# Patient Record
Sex: Female | Born: 1995 | Race: White | Hispanic: No | Marital: Single | Smoking: Current every day smoker
Health system: Southern US, Community
[De-identification: ages and names within clinical notes are randomized; demographics above are authoritative.]

## PROBLEM LIST (undated history)

## (undated) DIAGNOSIS — F419 Anxiety disorder, unspecified: Secondary | ICD-10-CM

## (undated) DIAGNOSIS — F32A Depression, unspecified: Secondary | ICD-10-CM

## (undated) DIAGNOSIS — I639 Cerebral infarction, unspecified: Secondary | ICD-10-CM

## (undated) DIAGNOSIS — G43909 Migraine, unspecified, not intractable, without status migrainosus: Secondary | ICD-10-CM

## (undated) DIAGNOSIS — J45909 Unspecified asthma, uncomplicated: Secondary | ICD-10-CM

## (undated) HISTORY — DX: Migraine, unspecified, not intractable, without status migrainosus: G43.909

## (undated) HISTORY — DX: Cerebral infarction, unspecified: I63.9

## (undated) HISTORY — PX: NO PAST SURGERIES: SHX2092

## (undated) HISTORY — DX: Depression, unspecified: F32.A

## (undated) HISTORY — DX: Anxiety disorder, unspecified: F41.9

---
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
## 2015-01-30 ENCOUNTER — Emergency Department
Admission: EM | Admit: 2015-01-30 | Discharge: 2015-01-30 | Disposition: A | Discharge status: Home / Self Care | Payer: Medicaid Other | Attending: Emergency Medicine | Admitting: Emergency Medicine

## 2015-01-30 ENCOUNTER — Encounter: Payer: Self-pay | Admitting: *Deleted

## 2015-01-30 DIAGNOSIS — J069 Acute upper respiratory infection, unspecified: Secondary | ICD-10-CM

## 2015-01-30 MED ORDER — BENZONATATE 100 MG PO CAPS: 100.0000 mg | ORAL_CAPSULE | Freq: Three times a day (TID) | ORAL | Status: DC | PRN

## 2015-01-30 MED ORDER — IBUPROFEN 800 MG PO TABS
800.0000 mg | ORAL_TABLET | Freq: Three times a day (TID) | ORAL | Status: DC | PRN
Start: 2015-01-30 — End: 2015-08-23

## 2015-01-30 MED ORDER — FEXOFENADINE-PSEUDOEPHED ER 60-120 MG PO TB12: 1.0000 | ORAL_TABLET | Freq: Two times a day (BID) | ORAL | Status: DC

## 2015-01-30 NOTE — ED Provider Notes (Signed)
[facility] Emergency Department Provider Note  ____________________________________________  Time seen: Approximately 12:58 PM  I have reviewed the triage vital signs and the nursing notes.   HISTORY  Chief Complaint URI    HPI Kristen Grant is a 19 y.o. female patient complain of cold symptoms for several days. Patient stated there is occasional cough with nasal congestion, frontal headache, ear pressure, and sore throat.Patient also complaining of intermittent fever and body aches. She states she had the same complaints 2 weeks ago and resolved until 5 days ago when she was exposed to a coworker was diagnosed with the flu. Patient denies any nausea vomiting diarrhea with this complaint. Patient has been using over-the-counter cold preparations for a transient relief. Patient denies any pain at this time.   History reviewed. No pertinent past medical history.  There are no active problems to display for this patient.   History reviewed. No pertinent past surgical history.  Current Outpatient Rx  Name  Route  Sig  Dispense  Refill  . benzonatate (TESSALON PERLES) 100 MG capsule   Oral   Take 1 capsule (100 mg total) by mouth 3 (three) times daily as needed for cough.   15 capsule   0   . fexofenadine-pseudoephedrine (ALLEGRA-D) 60-120 MG 12 hr tablet   Oral   Take 1 tablet by mouth 2 (two) times daily.   30 tablet   0   . ibuprofen (ADVIL,MOTRIN) 800 MG tablet   Oral   Take 1 tablet (800 mg total) by mouth every 8 (eight) hours as needed.   30 tablet   0     Allergies Review of patient's allergies indicates no known allergies.  No family history on file.  Social History Social History  Substance Use Topics  . Smoking status: Never Smoker   . Smokeless tobacco: None  . Alcohol Use: No    Review of Systems Constitutional: Fever and chills/body aches Eyes: No visual changes. ENT: Sore throat Cardiovascular: Denies chest  pain. Respiratory: Denies shortness of breath. Cough Gastrointestinal: No abdominal pain.  No nausea, no vomiting.  No diarrhea.  No constipation. Genitourinary: Negative for dysuria. Musculoskeletal: Negative for back pain. Skin: Negative for rash. Neurological: Positive for frontal headaches, denies focal weakness or numbness. 10-point ROS otherwise negative.  ____________________________________________   PHYSICAL EXAM:  VITAL SIGNS: ED Triage Vitals  Enc Vitals Group     BP 01/30/15 1142 144/90 mmHg     Pulse Rate 01/30/15 1142 89     Resp 01/30/15 1142 20     Temp 01/30/15 1142 97.9 F (36.6 C)     Temp Source 01/30/15 1142 Oral     SpO2 01/30/15 1142 100 %     Weight 01/30/15 1142 200 lb (90.719 kg)     Height 01/30/15 1142 5\' 7"  (1.702 m)     Head Cir --      Peak Flow --      Pain Score --      Pain Loc --      Pain Edu? --      Excl. in GC? --     Constitutional: Alert and oriented. Well appearing and in no acute distress. Eyes: Conjunctivae are normal. PERRL. EOMI. Head: Atraumatic. Nose: Bilateral edematous nasal turbinates. Bilateral maxillary guarding. Mouth/Throat: Mucous membranes are moist.  Oropharynx erythematous exudate. Postnasal drainage. Neck: No stridor. No cervical spine tenderness to palpation. Hematological/Lymphatic/Immunilogical: No cervical lymphadenopathy. Cardiovascular: Normal rate, regular rhythm. Grossly normal heart sounds.  Good peripheral circulation. Respiratory: Normal respiratory effort.  No retractions. Lungs CTAB. Gastrointestinal: Soft and nontender. No distention. No abdominal bruits. No CVA tenderness. Musculoskeletal: No lower extremity tenderness nor edema.  No joint effusions. Neurologic:  Normal speech and language. No gross focal neurologic deficits are appreciated. No gait instability. Skin:  Skin is warm, dry and intact. No rash noted. Psychiatric: Mood and affect are normal. Speech and behavior are  normal.  ____________________________________________   LABS (all labs ordered are listed, but only abnormal results are displayed)  Labs Reviewed - No data to display ____________________________________________  EKG   ____________________________________________  RADIOLOGY   ____________________________________________   PROCEDURES  Procedure(s) performed: None  Critical Care performed: No  ____________________________________________   INITIAL IMPRESSION / ASSESSMENT AND PLAN / ED COURSE  Pertinent labs & imaging results that were available during my care of the patient were reviewed by me and considered in my medical decision making (see chart for details).  Upper rest or infection. Patient given discharge Instructions. Patient given a prescription for Allegra-D, Tessalon pearls, and ibuprofen. Patient given a work note for 2 days. Last the follow-up with the [facility] condition persists. ____________________________________________   FINAL CLINICAL IMPRESSION(S) / ED DIAGNOSES  Final diagnoses:  URI (upper respiratory infection)      Joni Reining, PA-C 01/30/15 1306  Minna Antis, MD 01/30/15 [phone]

## 2015-01-30 NOTE — ED Notes (Signed)
Pt reports cough, congestion and body aches for 5 days

## 2015-01-30 NOTE — ED Notes (Signed)
Pt discharged home after verbalizing understanding of discharge instructions; nad noted. 

## 2015-01-30 NOTE — ED Notes (Signed)
States she developed cold sx's several days ago  Some fever and body aches   With occasional cough

## 2015-01-30 NOTE — Discharge Instructions (Signed)
Upper Respiratory Infection, Adult Most upper respiratory infections (URIs) are a viral infection of the air passages leading to the lungs. A URI affects the nose, throat, and upper air passages. The most common type of URI is nasopharyngitis and is typically referred to as "the common cold." URIs run their course and usually go away on their own. Most of the time, a URI does not require medical attention, but sometimes a bacterial infection in the upper airways can follow a viral infection. This is called a secondary infection. Sinus and middle ear infections are common types of secondary upper respiratory infections. Bacterial pneumonia can also complicate a URI. A URI can worsen asthma and chronic obstructive pulmonary disease (COPD). Sometimes, these complications can require emergency medical care and may be life threatening.  CAUSES Almost all URIs are caused by viruses. A virus is a type of germ and can spread from one person to another.  RISKS FACTORS You may be at risk for a URI if:   You smoke.   You have chronic heart or lung disease.  You have a weakened defense (immune) system.   You are very young or very old.   You have nasal allergies or asthma.  You work in crowded or poorly ventilated areas.  You work in health care facilities or schools. SIGNS AND SYMPTOMS  Symptoms typically develop 2-3 days after you come in contact with a cold virus. Most viral URIs last 7-10 days. However, viral URIs from the influenza virus (flu virus) can last 14-18 days and are typically more severe. Symptoms may include:   Runny or stuffy (congested) nose.   Sneezing.   Cough.   Sore throat.   Headache.   Fatigue.   Fever.   Loss of appetite.   Pain in your forehead, behind your eyes, and over your cheekbones (sinus pain).  Muscle aches.  DIAGNOSIS  Your health care provider may diagnose a URI by:  Physical exam.  Tests to check that your symptoms are not due to  another condition such as:  Strep throat.  Sinusitis.  Pneumonia.  Asthma. TREATMENT  A URI goes away on its own with time. It cannot be cured with medicines, but medicines may be prescribed or recommended to relieve symptoms. Medicines may help:  Reduce your fever.  Reduce your cough.  Relieve nasal congestion. HOME CARE INSTRUCTIONS   Take medicines only as directed by your health care provider.   Gargle warm saltwater or take cough drops to comfort your throat as directed by your health care provider.  Use a warm mist humidifier or inhale steam from a shower to increase air moisture. This may make it easier to breathe.  Drink enough fluid to keep your urine clear or pale yellow.   Eat soups and other clear broths and maintain good nutrition.   Rest as needed.   Return to work when your temperature has returned to normal or as your health care provider advises. You may need to stay home longer to avoid infecting others. You can also use a face mask and careful hand washing to prevent spread of the virus.  Increase the usage of your inhaler if you have asthma.   Do not use any tobacco products, including cigarettes, chewing tobacco, or electronic cigarettes. If you need help quitting, ask your health care provider. PREVENTION  The best way to protect yourself from getting a cold is to practice good hygiene.   Avoid oral or hand contact with people with cold   symptoms.   Wash your hands often if contact occurs.  There is no clear evidence that vitamin C, vitamin E, echinacea, or exercise reduces the chance of developing a cold. However, it is always recommended to get plenty of rest, exercise, and practice good nutrition.  SEEK MEDICAL CARE IF:   You are getting worse rather than better.   Your symptoms are not controlled by medicine.   You have chills.  You have worsening shortness of breath.  You have brown or red mucus.  You have yellow or brown nasal  discharge.  You have pain in your face, especially when you bend forward.  You have a fever.  You have swollen neck glands.  You have pain while swallowing.  You have white areas in the back of your throat. SEEK IMMEDIATE MEDICAL CARE IF:   You have severe or persistent:  Headache.  Ear pain.  Sinus pain.  Chest pain.  You have chronic lung disease and any of the following:  Wheezing.  Prolonged cough.  Coughing up blood.  A change in your usual mucus.  You have a stiff neck.  You have changes in your:  Vision.  Hearing.  Thinking.  Mood. MAKE SURE YOU:   Understand these instructions.  Will watch your condition.  Will get help right away if you are not doing well or get worse.   This information is not intended to replace advice given to you by your health care provider. Make sure you discuss any questions you have with your health care provider.   Document Released: 07/15/2000 Document Revised: 06/05/2014 Document Reviewed: 04/26/2013 Elsevier Interactive Patient Education 2016 [employer]  

## 2015-02-03 NOTE — L&D Delivery Note (Signed)
Delivery Note At 11:25 AM a viable female was delivered via Vaginal, Spontaneous Delivery (Presentation: ; Occiput Anterior).  APGAR: 7, 9; weight 8 lb 3 oz (3714 g).   Placenta status: Intact, Spontaneous.  Cord: 3 vessels, no cord blood needed. 2nd degree lac repaired with local 1% xylocaine with 3-0 CH on CT. Mod bleeding noted after placenta and Pitocin infusing wide open. Fundus massaged till firm. EBL 400 ml's. VSS, Hemostasis achieved. Bonding skin to skin. Pt did an amazing job pushing in a natural way to delivery. Fetus had a few variables and O2 was used and then head compression prior to delivery.   Anesthesia: 1% xylocaine Episiotomy: None Lacerations: 2nd degree;Periurethral Suture Repair: 3.0 chromic Est. Blood Loss (mL):  400  Mom to postpartum.  Baby to Couplet care / Skin to Skin.  Sharee Pimple 08/24/2015, 12:09 PM

## 2015-07-22 DIAGNOSIS — E669 Obesity, unspecified: Secondary | ICD-10-CM | POA: Insufficient documentation

## 2015-07-23 ENCOUNTER — Other Ambulatory Visit: Payer: Self-pay | Admitting: Obstetrics and Gynecology

## 2015-07-23 DIAGNOSIS — Z3689 Encounter for other specified antenatal screening: Secondary | ICD-10-CM

## 2015-07-25 ENCOUNTER — Ambulatory Visit
Admission: RE | Admit: 2015-07-25 | Discharge: 2015-07-25 | Disposition: A | Discharge status: Home / Self Care | Payer: Medicaid Other | Source: Ambulatory Visit | Attending: Obstetrics and Gynecology | Admitting: Obstetrics and Gynecology

## 2015-07-25 DIAGNOSIS — Z3689 Encounter for other specified antenatal screening: Secondary | ICD-10-CM

## 2015-07-25 DIAGNOSIS — Z3201 Encounter for pregnancy test, result positive: Secondary | ICD-10-CM | POA: Diagnosis not present

## 2015-07-25 DIAGNOSIS — O0933 Supervision of pregnancy with insufficient antenatal care, third trimester: Secondary | ICD-10-CM | POA: Diagnosis present

## 2015-07-25 DIAGNOSIS — Z3A37 37 weeks gestation of pregnancy: Secondary | ICD-10-CM | POA: Insufficient documentation

## 2015-07-29 ENCOUNTER — Other Ambulatory Visit: Payer: Self-pay | Admitting: Primary Care

## 2015-07-29 DIAGNOSIS — Z3201 Encounter for pregnancy test, result positive: Secondary | ICD-10-CM

## 2015-07-30 LAB — OB RESULTS CONSOLE RPR: RPR: NONREACTIVE

## 2015-07-30 LAB — OB RESULTS CONSOLE GBS: STREP GROUP B AG: POSITIVE

## 2015-07-30 LAB — OB RESULTS CONSOLE VARICELLA ZOSTER ANTIBODY, IGG: Varicella: NON-IMMUNE/NOT IMMUNE

## 2015-07-30 LAB — OB RESULTS CONSOLE RUBELLA ANTIBODY, IGM: RUBELLA: IMMUNE

## 2015-07-30 LAB — OB RESULTS CONSOLE HIV ANTIBODY (ROUTINE TESTING): HIV: NONREACTIVE

## 2015-07-30 LAB — OB RESULTS CONSOLE GC/CHLAMYDIA: GC PROBE AMP, GENITAL: NEGATIVE

## 2015-08-01 DIAGNOSIS — O09899 Supervision of other high risk pregnancies, unspecified trimester: Secondary | ICD-10-CM | POA: Insufficient documentation

## 2015-08-01 DIAGNOSIS — Z2839 Other underimmunization status: Secondary | ICD-10-CM | POA: Insufficient documentation

## 2015-08-07 ENCOUNTER — Ambulatory Visit: Payer: Medicaid Other

## 2015-08-23 ENCOUNTER — Inpatient Hospital Stay
Admission: EM | Admit: 2015-08-23 | Discharge: 2015-08-26 | DRG: 774 | Disposition: A | Discharge status: Home / Self Care | Payer: Medicaid Other | Attending: Obstetrics and Gynecology | Admitting: Obstetrics and Gynecology

## 2015-08-23 DIAGNOSIS — O99214 Obesity complicating childbirth: Secondary | ICD-10-CM | POA: Diagnosis present

## 2015-08-23 DIAGNOSIS — B589 Toxoplasmosis, unspecified: Secondary | ICD-10-CM | POA: Diagnosis present

## 2015-08-23 DIAGNOSIS — Z79899 Other long term (current) drug therapy: Secondary | ICD-10-CM | POA: Diagnosis not present

## 2015-08-23 DIAGNOSIS — O99824 Streptococcus B carrier state complicating childbirth: Secondary | ICD-10-CM | POA: Diagnosis present

## 2015-08-23 DIAGNOSIS — Z3A41 41 weeks gestation of pregnancy: Secondary | ICD-10-CM

## 2015-08-23 DIAGNOSIS — O480 Post-term pregnancy: Secondary | ICD-10-CM | POA: Diagnosis present

## 2015-08-23 DIAGNOSIS — O0930 Supervision of pregnancy with insufficient antenatal care, unspecified trimester: Secondary | ICD-10-CM | POA: Diagnosis not present

## 2015-08-23 DIAGNOSIS — O9862 Protozoal diseases complicating childbirth: Secondary | ICD-10-CM | POA: Diagnosis present

## 2015-08-23 HISTORY — DX: Unspecified asthma, uncomplicated: J45.909

## 2015-08-23 LAB — CBC
HCT: 34.2 % — ABNORMAL LOW (ref 35.0–47.0)
Hemoglobin: 11.7 g/dL — ABNORMAL LOW (ref 12.0–16.0)
MCH: 27.3 pg (ref 26.0–34.0)
MCHC: 34.2 g/dL (ref 32.0–36.0)
MCV: 79.8 fL — AB (ref 80.0–100.0)
PLATELETS: 255 10*3/uL (ref 150–440)
RBC: 4.29 MIL/uL (ref 3.80–5.20)
RDW: 24.2 % — AB (ref 11.5–14.5)
WBC: 9 10*3/uL (ref 3.6–11.0)

## 2015-08-23 LAB — TYPE AND SCREEN
ABO/RH(D): A POS
Antibody Screen: NEGATIVE

## 2015-08-23 MED ORDER — LIDOCAINE HCL (PF) 1 % IJ SOLN: 30.0000 mL | INTRAMUSCULAR | Status: DC | PRN

## 2015-08-23 MED ORDER — SIMETHICONE 80 MG PO CHEW: 80.0000 mg | CHEWABLE_TABLET | Freq: Four times a day (QID) | ORAL | Status: DC | PRN

## 2015-08-23 MED ORDER — SOD CITRATE-CITRIC ACID 500-334 MG/5ML PO SOLN: 30.0000 mL | ORAL | Status: DC | PRN

## 2015-08-23 MED ORDER — CALCIUM CARBONATE ANTACID 500 MG PO CHEW
1.0000 | CHEWABLE_TABLET | ORAL | Status: DC | PRN
  Administered 2015-08-23 – 2015-08-24 (×3): 200 mg via ORAL
  Filled 2015-08-23 (×4): qty 1

## 2015-08-23 MED ORDER — LIDOCAINE HCL (PF) 1 % IJ SOLN
INTRAMUSCULAR | Status: AC
  Filled 2015-08-23: qty 30

## 2015-08-23 MED ORDER — ACETAMINOPHEN 325 MG PO TABS: 650.0000 mg | ORAL_TABLET | ORAL | Status: DC | PRN

## 2015-08-23 MED ORDER — BUTORPHANOL TARTRATE 1 MG/ML IJ SOLN
1.0000 mg | INTRAMUSCULAR | Status: DC | PRN
  Administered 2015-08-24 (×2): 1 mg via INTRAVENOUS
  Filled 2015-08-23 (×2): qty 1

## 2015-08-23 MED ORDER — SODIUM CHLORIDE 0.9 % IV SOLN
2.0000 g | Freq: Once | INTRAVENOUS | Status: AC
  Administered 2015-08-23: 2 g via INTRAVENOUS
  Filled 2015-08-23: qty 2000

## 2015-08-23 MED ORDER — OXYTOCIN 40 UNITS IN LACTATED RINGERS INFUSION - SIMPLE MED
2.5000 [IU]/h | INTRAVENOUS | Status: DC
  Administered 2015-08-24: 2.5 [IU]/h via INTRAVENOUS
  Filled 2015-08-23: qty 1000

## 2015-08-23 MED ORDER — OXYTOCIN 10 UNIT/ML IJ SOLN
INTRAMUSCULAR | Status: AC
  Filled 2015-08-23: qty 2

## 2015-08-23 MED ORDER — MISOPROSTOL 200 MCG PO TABS
ORAL_TABLET | ORAL | Status: AC
  Filled 2015-08-23: qty 4

## 2015-08-23 MED ORDER — ONDANSETRON HCL 4 MG/2ML IJ SOLN
4.0000 mg | Freq: Four times a day (QID) | INTRAMUSCULAR | Status: DC | PRN
  Administered 2015-08-24: 4 mg via INTRAVENOUS
  Filled 2015-08-23: qty 2

## 2015-08-23 MED ORDER — LACTATED RINGERS IV SOLN
INTRAVENOUS | Status: DC
  Administered 2015-08-23: 125 mL/h via INTRAVENOUS
  Administered 2015-08-24: 05:00:00 via INTRAVENOUS

## 2015-08-23 MED ORDER — DINOPROSTONE 10 MG VA INST
10.0000 mg | VAGINAL_INSERT | Freq: Once | VAGINAL | Status: AC
  Administered 2015-08-23: 10 mg via VAGINAL
  Filled 2015-08-23 (×2): qty 1

## 2015-08-23 MED ORDER — OXYTOCIN 40 UNITS IN LACTATED RINGERS INFUSION - SIMPLE MED: 1.0000 m[IU]/min | INTRAVENOUS | Status: DC

## 2015-08-23 MED ORDER — LACTATED RINGERS IV SOLN: 500.0000 mL | INTRAVENOUS | Status: DC | PRN

## 2015-08-23 MED ORDER — OXYTOCIN BOLUS FROM INFUSION
500.0000 mL | INTRAVENOUS | Status: DC
  Administered 2015-08-24: 500 mL via INTRAVENOUS

## 2015-08-23 MED ORDER — AMMONIA AROMATIC IN INHA
RESPIRATORY_TRACT | Status: AC
  Filled 2015-08-23: qty 10

## 2015-08-23 MED ORDER — AMPICILLIN SODIUM 1 G IJ SOLR
1.0000 g | Freq: Four times a day (QID) | INTRAMUSCULAR | Status: DC
  Administered 2015-08-23 – 2015-08-24 (×5): 1 g via INTRAVENOUS
  Filled 2015-08-23 (×5): qty 1000

## 2015-08-23 MED ORDER — TERBUTALINE SULFATE 1 MG/ML IJ SOLN: 0.2500 mg | Freq: Once | INTRAMUSCULAR | Status: DC | PRN

## 2015-08-23 NOTE — Progress Notes (Signed)
S: Resting comfortably  no CTX, no LOF, VB.O: Filed Vitals:   08/23/15 1235 08/23/15 1305 08/23/15 1325 08/23/15 1613  BP: 125/73  113/63 133/74  Pulse: 73  68 74  Temp: 98.4 F (36.9 C)  98.4 F (36.9 C) 98.4 F (36.9 C)  TempSrc: Oral  Axillary Oral  Resp: 18  17 18   Height:  5\' 9"  (1.753 m)    Weight:  218 lb (98.884 kg)       Gen: NAD, AAOx3      Abd: FNTTP      Ext: Non-tender, Nonedmeatous    FHT: +mod var + accelerations no decelerations TOCO: none   A/P:  20 y.o. yo G1P0 at 7062w2d for Post-dates IOL.   Labor: Induction with Cervidil repeat tonight after shower  FWB: Reassuring Cat 1 tracing. EFW 8#9  Cervidil fell out at 2300. Will replace after pt has shower and dinner.   Sharee Pimplearon W Jones 11:25 PM

## 2015-08-23 NOTE — H&P (Signed)
  OB ADMISSION/ HISTORY & PHYSICAL:  Admission Date: 08/23/2015  9:07 AM  Admit Diagnosis: Induction of labor at 41+2 weeks for postdates   Kristen Grant is a 20 y.o. female presenting for induction of labr at 41+2 weeks for postdates.   Prenatal History: G1P0   EDC : 08/14/2015, by unknown LMP, confirmed by 36 week US  [facility] Prenatal course complicated by late entry to prenatal care at 36 weeks, GBS positive, varicella non-immune, acquired toxoplasmosis, unknown paternity (3 potential fathers), obesity   Prenatal Labs: ABO, Rh: --/--/A POS (07/21 1222) Antibody: NEG (07/21 1222) Rubella:   Immune Varicella: Non-Immune  RPR:   NR HBsAg:   Negative HIV:   Negative Hgb A1C: 5.4 GBS:   Positive   Medical / Surgical History :  Past medical history:  Past Medical History  Diagnosis Date  . Asthma     no treatments required at this time     Past surgical history:  Past Surgical History  Procedure Laterality Date  . No past surgeries      Family History: History reviewed. No pertinent family history.   Social History:  reports that she has never smoked. She has never used smokeless tobacco. She reports that she does not drink alcohol or use illicit drugs.   Allergies: Review of patient's allergies indicates no known allergies.    Current Medications at time of admission:  Prior to Admission medications   Medication Sig Start Date End Date Taking? Authorizing Provider  ferrous sulfate 325 (65 FE) MG EC tablet Take 325 mg by mouth 3 (three) times daily with meals.   Yes Historical Provider, MD  ranitidine (ZANTAC) 75 MG tablet Take 150 mg by mouth 2 (two) times daily.   Yes Historical Provider, MD     Review of Systems: Active FM Irregular No LOF  / SROM  No bloody show   Physical Exam:  VS: Blood pressure 125/73, pulse 73, temperature 98.4 F (36.9 C), temperature source Oral, resp. rate 18.  General: alert and oriented, appears  calm Heart: RRR Lungs: Clear lung fields Abdomen: Gravid, soft and non-tender, non-distended / uterus: gravid, non-tender Extremities: no edema  Genitalia / VE: Dilation: 2.5 Effacement (%): 50, 60 Station: -3 Exam by:: Millner, RN   FHR: baseline rate 125 bpm / variability Moderate / accelerations + / no decelerations TOCO: irregular   Assessment: 41+[redacted] weeks gestation Induction stage of labor FHR category 1   Plan:  [facility]. [facility] for Postdates    - Routine Labor and Delivery orders    - May have Stadol 1mg  IVP every 1 hour PRN    - May have epidural upon request     - Was planning for a Pitocin induction based on cervical exam, but due to staffing and high patient acuity, we had to proceed with cervical ripening first, will plan for Pitocin after  2. GBS Positive     - Ampicillin 2 grams x 1 dose, then 1 gram every 6 hours 3. Varicella Non-immune     - Varivax vaccine PP 4. Anticipate NSVD  Dr. Dalbert GarnetBeasley  notified of admission / plan of care  Carlean JewsMeredith Sigmon, CNM

## 2015-08-23 NOTE — Progress Notes (Signed)
S: Resting comfortably with Cervidil in place.  + CTX, no LOF, VB O: Filed Vitals:   08/23/15 1235 08/23/15 1305 08/23/15 1325 08/23/15 1613  BP: 125/73  113/63 133/74  Pulse: 73  68 74  Temp: 98.4 F (36.9 C)  98.4 F (36.9 C) 98.4 F (36.9 C)  TempSrc: Oral  Axillary Oral  Resp: 18  17 18   Height:  5\' 9"  (1.753 m)    Weight:  218 lb (98.884 kg)       Gen: NAD, AAOx3, Smiling and happy with plan of care      Abd: FNTTP      Ext: Non-tender, Nonedmeatous    FHT:  mod var + accelerations no decelerations TOCO: Q 3 min SVE: not rechecked as Cervidil in x 6 hrs.    A/P:  20 y.o. yo G1P0 at 339w2d for post-dates IOL.   Labor:early  FWB: Reassuring Cat 1 tracing. EFW 8#12  GBS:neg  Contraception: at 6 weeks    Kristen Grant 6:55 PM

## 2015-08-24 ENCOUNTER — Encounter: Payer: Self-pay | Admitting: *Deleted

## 2015-08-24 LAB — RPR: RPR: NONREACTIVE

## 2015-08-24 LAB — CHLAMYDIA/NGC RT PCR (ARMC ONLY)
Chlamydia Tr: NOT DETECTED
N gonorrhoeae: NOT DETECTED

## 2015-08-24 MED ORDER — BISACODYL 10 MG RE SUPP
10.0000 mg | Freq: Every day | RECTAL | Status: DC | PRN
  Filled 2015-08-24: qty 1

## 2015-08-24 MED ORDER — COCONUT OIL OIL
1.0000 "application " | TOPICAL_OIL | Status: DC | PRN
  Filled 2015-08-24 (×2): qty 120

## 2015-08-24 MED ORDER — DIPHENHYDRAMINE HCL 25 MG PO CAPS: 25.0000 mg | ORAL_CAPSULE | Freq: Four times a day (QID) | ORAL | Status: DC | PRN

## 2015-08-24 MED ORDER — BENZOCAINE-MENTHOL 20-0.5 % EX AERO: 1.0000 "application " | INHALATION_SPRAY | CUTANEOUS | Status: DC | PRN

## 2015-08-24 MED ORDER — WITCH HAZEL-GLYCERIN EX PADS: 1.0000 "application " | MEDICATED_PAD | CUTANEOUS | Status: DC | PRN

## 2015-08-24 MED ORDER — SODIUM CHLORIDE 0.9% FLUSH: 3.0000 mL | INTRAVENOUS | Status: DC | PRN

## 2015-08-24 MED ORDER — SODIUM CHLORIDE 0.9% FLUSH: 3.0000 mL | Freq: Two times a day (BID) | INTRAVENOUS | Status: DC

## 2015-08-24 MED ORDER — SENNOSIDES-DOCUSATE SODIUM 8.6-50 MG PO TABS
2.0000 | ORAL_TABLET | ORAL | Status: DC
  Administered 2015-08-25 – 2015-08-26 (×2): 2 via ORAL
  Filled 2015-08-24 (×2): qty 2

## 2015-08-24 MED ORDER — OXYCODONE HCL 5 MG PO TABS: 10.0000 mg | ORAL_TABLET | ORAL | Status: DC | PRN

## 2015-08-24 MED ORDER — ZOLPIDEM TARTRATE 5 MG PO TABS: 5.0000 mg | ORAL_TABLET | Freq: Every evening | ORAL | Status: DC | PRN

## 2015-08-24 MED ORDER — DIBUCAINE 1 % RE OINT: 1.0000 "application " | TOPICAL_OINTMENT | RECTAL | Status: DC | PRN

## 2015-08-24 MED ORDER — IBUPROFEN 600 MG PO TABS
600.0000 mg | ORAL_TABLET | Freq: Four times a day (QID) | ORAL | Status: DC
  Administered 2015-08-24 – 2015-08-26 (×8): 600 mg via ORAL
  Filled 2015-08-24 (×8): qty 1

## 2015-08-24 MED ORDER — ACETAMINOPHEN 325 MG PO TABS: 650.0000 mg | ORAL_TABLET | ORAL | Status: DC | PRN

## 2015-08-24 MED ORDER — SIMETHICONE 80 MG PO CHEW: 80.0000 mg | CHEWABLE_TABLET | ORAL | Status: DC | PRN

## 2015-08-24 MED ORDER — FLEET ENEMA 7-19 GM/118ML RE ENEM: 1.0000 | ENEMA | Freq: Every day | RECTAL | Status: DC | PRN

## 2015-08-24 MED ORDER — ONDANSETRON HCL 4 MG/2ML IJ SOLN: 4.0000 mg | INTRAMUSCULAR | Status: DC | PRN

## 2015-08-24 MED ORDER — ONDANSETRON HCL 4 MG PO TABS: 4.0000 mg | ORAL_TABLET | ORAL | Status: DC | PRN

## 2015-08-24 MED ORDER — SODIUM CHLORIDE 0.9 % IV SOLN: 250.0000 mL | INTRAVENOUS | Status: DC | PRN

## 2015-08-24 MED ORDER — DINOPROSTONE 10 MG VA INST
10.0000 mg | VAGINAL_INSERT | Freq: Once | VAGINAL | Status: AC
  Administered 2015-08-24: 10 mg via VAGINAL
  Filled 2015-08-24: qty 1

## 2015-08-24 MED ORDER — PRENATAL MULTIVITAMIN CH
1.0000 | ORAL_TABLET | Freq: Every day | ORAL | Status: DC
  Administered 2015-08-24 – 2015-08-26 (×3): 1 via ORAL
  Filled 2015-08-24 (×4): qty 1

## 2015-08-24 MED ORDER — OXYCODONE HCL 5 MG PO TABS
5.0000 mg | ORAL_TABLET | ORAL | Status: DC | PRN
Start: 2015-08-24 — End: 2015-08-26
  Filled 2015-08-24: qty 1

## 2015-08-24 NOTE — Progress Notes (Addendum)
Post Partum Day 1 Subjective: Feeling well today. Nursing infant well.   Objective: Blood pressure 158/68, pulse 78, temperature 98.1 F (36.7 C), temperature source Oral, resp. rate 18, height 5\' 9"  (1.753 m), weight 218 lb (98.884 kg), unknown if currently breastfeeding.  Physical Exam:  General: A,A & O x 3,  Lungs: CTA bilat, NO W./R/R. Heart: S1S2, RRR, no M/R/G. Lochia: mod without clot Uterine Fundus:firm, U-2 DVT Evaluation: neg Homans   Recent Labs  08/23/15 1221  HGB 11.7*  HCT 34.2*  WBC 9.0  PLT 255    Assessment/Plan: A: PPD#1 stable 2. Lactation P: 1. DC in am 2. Plans Birth control   LOS: 1 day   Sharee Pimple 08/24/2015, 3:25 PM

## 2015-08-24 NOTE — Progress Notes (Signed)
S: Resting comfortably without epidural. + CTX, + LOF, no VB O: Filed Vitals:   08/24/15 0405 08/24/15 0536 08/24/15 0642 08/24/15 0717  BP: 131/77 114/63 136/74 126/79  Pulse: 69 64 72 69  Temp:    98.3 F (36.8 C)  TempSrc:    Oral  Resp:      Height:      Weight:       Gen: NAD, AAOx3      Abd: FNTTP      Ext: Non-tender, Nonedmeatous    FHT: + mod var + accelerations no decelerations TOCO: Q 1-2 mins, MVU's 120.  SVE: 8/100/vtx+1 After vag exam, pt started to scream out, "The baby is coming". Re-positioned after IUPC and IFSE applied. Pt had FHR in 100's and then +accels and now FHR 130-150. No decels.   A/P:  20 y.o. yo G1P0 at [redacted]w[redacted]d for post-dates Induction  Labor: Transition  FWB: Reassuring Cat 1 tracing.   GBS: pos getting Ampicillin IV 1 gm q 4 h  Antic SVD.   Kristen Grant 10:33 AM

## 2015-08-24 NOTE — Plan of Care (Signed)
Pt up to void. Voided QS.  Pericare completed well per pt. Ice pack to perineum.  Pt ready for transfer to [facility] via wheelchair in stable condition.  Family members at side. To 338.  Ellison Carwin RNC

## 2015-08-25 LAB — CBC
HEMATOCRIT: 29.3 % — AB (ref 35.0–47.0)
HEMOGLOBIN: 9.8 g/dL — AB (ref 12.0–16.0)
MCH: 26.8 pg (ref 26.0–34.0)
MCHC: 33.4 g/dL (ref 32.0–36.0)
MCV: 80.3 fL (ref 80.0–100.0)
Platelets: 215 10*3/uL (ref 150–440)
RBC: 3.65 MIL/uL — ABNORMAL LOW (ref 3.80–5.20)
RDW: 25.4 % — ABNORMAL HIGH (ref 11.5–14.5)
WBC: 12.7 10*3/uL — AB (ref 3.6–11.0)

## 2015-08-25 NOTE — Plan of Care (Signed)
Problem: Health Behavior/Discharge Planning: Goal: Ability to manage health-related needs will improve Outcome: Completed/Met Date Met: 08/25/15 Pt. Is Very Independent in her care;asking appropriate questions.  Problem: Pain Managment: Goal: General experience of comfort will improve Outcome: Completed/Met Date Met: 08/25/15 Pt. States Ibuprofen every  6 hours relieves abd. Cramping and sore Perineum.  Problem: Physical Regulation: Goal: Ability to maintain clinical measurements within normal limits will improve Outcome: Completed/Met Date Met: 08/25/15 All Parameters WNL.  Goal: Will remain free from infection Outcome: Completed/Met Date Met: 08/25/15 No S/S Infection  Problem: Activity: Goal: Will verbalize the importance of balancing activity with adequate rest periods Outcome: Completed/Met Date Met: 08/25/15 Mom  Demonstrating ability to take care of herself and infant's needs and resting as needed.  Problem: Life Cycle: Goal: Risk for postpartum hemorrhage will decrease Outcome: Progressing Lochial flow WNL. Fundus is fir, at U/-1  Problem: Nutritional: Goal: Dietary intake will improve Outcome: Completed/Met Date Met: 08/25/15 Tolerating Regular Diet and taking in aprop. Amount of Fluids for a Breast Feeding Mom. Goal: Mother's verbalization of comfort with breastfeeding process will improve Outcome: 78 Mom is obtaining an effective Latch with Nursing. V/O of use of Coconut Oil for breast Tenderness and s/s to report of potential complications with Breast Feeding.  Problem: Pain Management: Goal: General experience of comfort will improve and pain level will decrease Outcome: Progressing States Ibuprofen eery 6 hours is relieving Abd. Cramping.  Problem: Bowel/Gastric: Goal: Gastrointestinal status will improve Outcome: Progressing No B.M. As yet but is passing Flatus.

## 2015-08-26 MED ORDER — VARICELLA VIRUS VACCINE LIVE 1350 PFU/0.5ML IJ SUSR
0.5000 mL | Freq: Once | INTRAMUSCULAR | Status: AC
  Administered 2015-08-26: 0.5 mL via SUBCUTANEOUS
  Filled 2015-08-26: qty 0.5

## 2015-08-26 NOTE — Progress Notes (Signed)
All discharge instructions given to patient and she voices understanding of all instructions given  She will make her own f/u appt.

## 2015-08-26 NOTE — Progress Notes (Signed)
Obstetric Discharge Summary   Patient ID: Kristen Grant MRN: [id number] DOB/AGE: 06/15/1995 20 y.o.   Date of Admission: 08/23/2015  Date of Discharge: 08/26/15  Admitting Diagnosis: IUP  at [redacted]w[redacted]d for IOL  Secondary Diagnosis: Post-dates  Mode of Delivery: NSVD of viable female                                                              Discharge Diagnosis: NSVD of viable female                        Intrapartum Procedures: IFSE, IUPC, Cervidil x 2, Pitocin                        Post partum procedures: None  Complications: none   Brief Hospital Course  Kristen Grant is a G1P1001 who had a SVD on PPD#2;  for further details of this delivery, please refer to the delivery note.  Patient had an uncomplicated postpartum course.  By time of discharge on PPD#2, her pain was controlled on oral pain medications; she had appropriate lochia and was ambulating, voiding without difficulty and tolerating regular diet.  She was deemed stable for discharge to home.     Labs: CBC Latest Ref Rng & Units 08/25/2015 08/23/2015 03/19/2013  WBC 3.6 - 11.0 K/uL 12.7(H) 9.0 9.0  Hemoglobin 12.0 - 16.0 g/dL 9.8(L) 11.7(L) 14.9  Hematocrit 35.0 - 47.0 % 29.3(L) 34.2(L) 42.5  Platelets 150 - 440 K/uL 215 255 304   A POS  Physical exam:  Blood pressure 125/79, pulse 69, temperature 98.4 F (36.9 C), temperature source Oral, resp. rate 18, height 5' 9" (1.753 m), weight 218 lb (98.9 kg), SpO2 99 %, unknown if currently breastfeeding. General: alert and no distress Heart: S1S2, RRR, No M/R/G. Lungs: CTA bilat, no W/R/R. Lochia: appropriate Abdomen: soft, NT Uterine Fundus: firm, U-2 Extremities: No evidence of DVT seen on physical exam. No lower extremity edema.  Discharge Instructions: Per After Visit Summary. Activity: Advance as tolerated. Pelvic rest for 6 weeks.  Also refer to After Visit Summary Diet: Regular Medications:   Medication List    ASK your doctor about these  medications   ferrous sulfate 325 (65 FE) MG EC tablet Take 325 mg by mouth 3 (three) times daily with meals.  ranitidine 75 MG tablet Commonly known as:  ZANTAC Take 150 mg by mouth 2 (two) times daily.     Outpatient follow up:  Postpartum contraception: Pt has not decided and will by 6 weeks pp  Discharged Condition: stable   Discharged to: Home   Newborn Data:  Baby Boy  named "Christian"  Disposition:Home  Apgars: APGAR (1 MIN): 7   APGAR (5 MINS): 9   APGAR (10 MINS):    Baby Feeding: Breast  Lathyn Griggs W Jaxden Blyden, CNM 08/26/2015    

## 2015-08-26 NOTE — Discharge Instructions (Signed)
Follow up sooner with fever, problems breathing, pain not helped by medications, severe depression( more than just baby blues, wanting to hurt yourself or the baby), severe bleeding ( saturating more than one pad an hour or large palm sized clots), no heavy lifting , no driving while taking narcotics, no douches, intercourse, tampons or enemas for 6 weeks Vaginal Delivery During delivery, your health care provider will help you give birth to your baby. During a vaginal delivery, you will work to push the baby out of your vagina. However, before you can push your baby out, a few things need to happen. The opening of your uterus (cervix) has to soften, thin out, and open up (dilate) all the way to 10 cm. Also, your baby has to move down from the uterus into your vagina.  SIGNS OF LABOR  Your health care provider will first need to make sure you are in labor. Signs of labor include:   Passing what is called the mucous plug before labor begins. This is a small amount of blood-stained mucus.  Having regular, painful uterine contractions.   The time between contractions gets shorter.   The discomfort and pain gradually get more intense.  Contraction pains get worse when walking and do not go away when resting.   Your cervix becomes thinner (effacement) and dilates. BEFORE THE DELIVERY Once you are in labor and admitted into the hospital or care center, your health care provider may do the following:   Perform a complete physical exam.  Review any complications related to pregnancy or labor.  Check your blood pressure, pulse, temperature, and heart rate (vital signs).   Determine if, and when, the rupture of amniotic membranes occurred.  Do a vaginal exam (using a sterile glove and lubricant) to determine:   The position (presentation) of the baby. Is the baby's head presenting first (vertex) in the birth canal (vagina), or are the feet or buttocks first (breech)?   The level (station)  of the baby's head within the birth canal.   The effacement and dilatation of the cervix.   An electronic fetal monitor is usually placed on your abdomen when you first arrive. This is used to monitor your contractions and the baby's heart rate.  When the monitor is on your abdomen (external fetal monitor), it can only pick up the frequency and length of your contractions. It cannot tell the strength of your contractions.  If it becomes necessary for your health care provider to know exactly how strong your contractions are or to see exactly what the baby's heart rate is doing, an internal monitor may be inserted into your vagina and uterus. Your health care provider will discuss the benefits and risks of using an internal monitor and obtain your permission before inserting the device.  Continuous fetal monitoring may be needed if you have an epidural, are receiving certain medicines (such as oxytocin), or have pregnancy or labor complications.  An IV access tube may be placed into a vein in your arm to deliver fluids and medicines if necessary. THREE STAGES OF LABOR AND DELIVERY Normal labor and delivery is divided into three stages. First Stage This stage starts when you begin to contract regularly and your cervix begins to efface and dilate. It ends when your cervix is completely open (fully dilated). The first stage is the longest stage of labor and can last from 3 hours to 15 hours.  Several methods are available to help with labor pain. You and your health  care provider will decide which option is best for you. Options include:   Opioid medicines. These are strong pain medicines that you can get through your IV tube or as a shot into your muscle. These medicines lessen pain but do not make it go away completely.  Epidural. A medicine is given through a thin tube that is inserted in your back. The medicine numbs the lower part of your body and prevents any pain in that  area.  Paracervical pain medicine. This is an injection of an anesthetic on each side of your cervix.   You may request natural childbirth, which does not involve the use of pain medicines or an epidural during labor and delivery. Instead, you will use other things, such as breathing exercises, to help cope with the pain. Second Stage The second stage of labor begins when your cervix is fully dilated at 10 cm. It continues until you push your baby down through the birth canal and the baby is born. This stage can take only minutes or several hours.  The location of your baby's head as it moves through the birth canal is reported as a number called a station. If the baby's head has not started its descent, the station is described as being at minus 3 (-3). When your baby's head is at the zero station, it is at the middle of the birth canal and is engaged in the pelvis. The station of your baby helps indicate the progress of the second stage of labor.  When your baby is born, your health care provider may hold the baby with his or her head lowered to prevent amniotic fluid, mucus, and blood from getting into the baby's lungs. The baby's mouth and nose may be suctioned with a small bulb syringe to remove any additional fluid.  Your health care provider may then place the baby on your stomach. It is important to keep the baby from getting cold. To do this, the health care provider will dry the baby off, place the baby directly on your skin (with no blankets between you and the baby), and cover the baby with warm, dry blankets.   The umbilical cord is cut. Third Stage During the third stage of labor, your health care provider will deliver the placenta (afterbirth) and make sure your bleeding is under control. The delivery of the placenta usually takes about 5 minutes but can take up to 30 minutes. After the placenta is delivered, a medicine may be given either by IV or injection to help contract the  uterus and control bleeding. If you are planning to breastfeed, you can try to do so now. After you deliver the placenta, your uterus should contract and get very firm. If your uterus does not remain firm, your health care provider will massage it. This is important because the contraction of the uterus helps cut off bleeding at the site where the placenta was attached to your uterus. If your uterus does not contract properly and stay firm, you may continue to bleed heavily. If there is a lot of bleeding, medicines may be given to contract the uterus and stop the bleeding.    This information is not intended to replace advice given to you by your health care provider. Make sure you discuss any questions you have with your health care provider.   Document Released: 10/29/2007 Document Revised: 02/09/2014 Document Reviewed: 09/16/2011 Elsevier Interactive Patient Education [employer]

## 2015-08-26 NOTE — Progress Notes (Signed)
Patient discharged home with mom and infant escorted out by auxillary

## 2015-09-19 NOTE — Discharge Summary (Signed)
Obstetric Discharge Summary   Patient ID: Kristen Grant MRN: [id number] DOB/AGE: 20/10/1995 20 y.o.   Date of Admission: 08/23/2015  Date of Discharge: 08/26/15  Admitting Diagnosis: IUP  at [id number] for IOL  Secondary Diagnosis: Post-dates  Mode of Delivery: NSVD of viable female                                                              Discharge Diagnosis: NSVD of viable female                        Intrapartum Procedures: IFSE, IUPC, Cervidil x 2, Pitocin                        Post partum procedures: None  Complications: none   Brief Hospital Course  Kristen Grant is a G1P1001 who had a SVD on PPD#2;  for further details of this delivery, please refer to the delivery note.  Patient had an uncomplicated postpartum course.  By time of discharge on PPD#2, her pain was controlled on oral pain medications; she had appropriate lochia and was ambulating, voiding without difficulty and tolerating regular diet.  She was deemed stable for discharge to home.     Labs: CBC Latest Ref Rng & Units 08/25/2015 08/23/2015 03/19/2013  WBC 3.6 - 11.0 K/uL 12.7(H) 9.0 9.0  Hemoglobin 12.0 - 16.0 g/dL 0.8(M9.8(L) 11.7(L) 14.9  Hematocrit 35.0 - 47.0 % 29.3(L) 34.2(L) 42.5  Platelets 150 - 440 K/uL 215 255 304   A POS  Physical exam:  Blood pressure 125/79, pulse 69, temperature 98.4 F (36.9 C), temperature source Oral, resp. rate 18, height 5\' 9"  (1.753 m), weight 218 lb (98.9 kg), SpO2 99 %, unknown if currently breastfeeding. General: alert and no distress Heart: S1S2, RRR, No M/R/G. Lungs: CTA bilat, no W/R/R. Lochia: appropriate Abdomen: soft, NT Uterine Fundus: firm, U-2 Extremities: No evidence of DVT seen on physical exam. No lower extremity edema.  Discharge Instructions: Per After Visit Summary. Activity: Advance as tolerated. Pelvic rest for 6 weeks.  Also refer to After Visit Summary Diet: Regular Medications:   Medication List    ASK your doctor about these  medications   ferrous sulfate 325 (65 FE) MG EC tablet Take 325 mg by mouth 3 (three) times daily with meals.  ranitidine 75 MG tablet Commonly known as:  ZANTAC Take 150 mg by mouth 2 (two) times daily.     Outpatient follow up:  Postpartum contraception: Pt has not decided and will by 6 weeks pp  Discharged Condition: stable   Discharged to: Home   Newborn Data:  Baby Boy  named "Christian"  Disposition:Home  Apgars: APGAR (1 MIN): 7   APGAR (5 MINS): 9   APGAR (10 MINS):    Baby Feeding: Breast  Sharee Pimplearon W Burlin Mcnair, CNM 08/26/2015

## 2017-06-16 LAB — HM HIV SCREENING LAB: HM HIV Screening: NEGATIVE

## 2017-07-07 DIAGNOSIS — F988 Other specified behavioral and emotional disorders with onset usually occurring in childhood and adolescence: Secondary | ICD-10-CM | POA: Insufficient documentation

## 2017-07-26 DIAGNOSIS — F411 Generalized anxiety disorder: Secondary | ICD-10-CM | POA: Insufficient documentation

## 2017-12-30 ENCOUNTER — Emergency Department
Admission: EM | Admit: 2017-12-30 | Discharge: 2017-12-30 | Disposition: A | Discharge status: Home / Self Care | Payer: BLUE CROSS/BLUE SHIELD | Attending: Emergency Medicine | Admitting: Emergency Medicine

## 2017-12-30 ENCOUNTER — Other Ambulatory Visit: Payer: Self-pay

## 2017-12-30 DIAGNOSIS — E860 Dehydration: Secondary | ICD-10-CM | POA: Insufficient documentation

## 2017-12-30 DIAGNOSIS — J45909 Unspecified asthma, uncomplicated: Secondary | ICD-10-CM | POA: Diagnosis not present

## 2017-12-30 DIAGNOSIS — R197 Diarrhea, unspecified: Secondary | ICD-10-CM | POA: Insufficient documentation

## 2017-12-30 DIAGNOSIS — R42 Dizziness and giddiness: Secondary | ICD-10-CM | POA: Diagnosis not present

## 2017-12-30 DIAGNOSIS — R112 Nausea with vomiting, unspecified: Secondary | ICD-10-CM

## 2017-12-30 LAB — LIPASE, BLOOD: Lipase: 25 U/L (ref 11–51)

## 2017-12-30 LAB — CBC
HCT: 43.6 % (ref 36.0–46.0)
Hemoglobin: 14 g/dL (ref 12.0–15.0)
MCH: 26.4 pg (ref 26.0–34.0)
MCHC: 32.1 g/dL (ref 30.0–36.0)
MCV: 82.1 fL (ref 80.0–100.0)
Platelets: 328 10*3/uL (ref 150–400)
RBC: 5.31 MIL/uL — ABNORMAL HIGH (ref 3.87–5.11)
RDW: 15.5 % (ref 11.5–15.5)
WBC: 11.3 10*3/uL — ABNORMAL HIGH (ref 4.0–10.5)
nRBC: 0 % (ref 0.0–0.2)

## 2017-12-30 LAB — COMPREHENSIVE METABOLIC PANEL
ALT: 14 U/L (ref 0–44)
AST: 19 U/L (ref 15–41)
Albumin: 5.1 g/dL — ABNORMAL HIGH (ref 3.5–5.0)
Alkaline Phosphatase: 68 U/L (ref 38–126)
Anion gap: 8 (ref 5–15)
BUN: 9 mg/dL (ref 6–20)
CHLORIDE: 109 mmol/L (ref 98–111)
CO2: 23 mmol/L (ref 22–32)
Calcium: 9.1 mg/dL (ref 8.9–10.3)
Creatinine, Ser: 0.43 mg/dL — ABNORMAL LOW (ref 0.44–1.00)
GFR calc Af Amer: >60 mL/min (ref >60)
GFR calc non Af Amer: >60 mL/min (ref >60)
Glucose, Bld: 98 mg/dL (ref 70–99)
Potassium: 3.8 mmol/L (ref 3.5–5.1)
Sodium: 140 mmol/L (ref 135–145)
Total Bilirubin: 2.2 mg/dL — ABNORMAL HIGH (ref 0.3–1.2)
Total Protein: 7.9 g/dL (ref 6.5–8.1)

## 2017-12-30 LAB — URINALYSIS, COMPLETE (UACMP) WITH MICROSCOPIC
Bacteria, UA: NONE SEEN
Bilirubin Urine: NEGATIVE
Glucose, UA: NEGATIVE mg/dL
KETONES UR: 5 mg/dL — AB
Leukocytes, UA: NEGATIVE
Nitrite: NEGATIVE
Protein, ur: NEGATIVE mg/dL
Specific Gravity, Urine: 1.019 (ref 1.005–1.030)
pH: 5 (ref 5.0–8.0)

## 2017-12-30 LAB — POC URINE PREG, ED: PREG TEST UR: NEGATIVE

## 2017-12-30 MED ORDER — LOPERAMIDE HCL 2 MG PO CAPS
4.0000 mg | ORAL_CAPSULE | Freq: Once | ORAL | Status: AC
  Administered 2017-12-30: 4 mg via ORAL
  Filled 2017-12-30: qty 2

## 2017-12-30 MED ORDER — SODIUM CHLORIDE 0.9 % IV BOLUS
1000.0000 mL | Freq: Once | INTRAVENOUS | Status: AC
  Administered 2017-12-30: 1000 mL via INTRAVENOUS

## 2017-12-30 MED ORDER — ONDANSETRON 4 MG PO TBDP: 4.0000 mg | ORAL_TABLET | Freq: Three times a day (TID) | ORAL | 0 refills | Status: DC | PRN

## 2017-12-30 MED ORDER — HYOSCYAMINE SULFATE 0.125 MG PO TABS
0.2500 mg | ORAL_TABLET | Freq: Once | ORAL | Status: DC
  Filled 2017-12-30: qty 2

## 2017-12-30 MED ORDER — ONDANSETRON HCL 4 MG/2ML IJ SOLN
4.0000 mg | Freq: Once | INTRAMUSCULAR | Status: AC
  Administered 2017-12-30: 4 mg via INTRAVENOUS
  Filled 2017-12-30: qty 2

## 2017-12-30 MED ORDER — ONDANSETRON HCL 4 MG/2ML IJ SOLN
INTRAMUSCULAR | Status: AC
  Administered 2017-12-30: 4 mg via INTRAVENOUS
  Filled 2017-12-30: qty 2

## 2017-12-30 MED ORDER — ONDANSETRON HCL 4 MG/2ML IJ SOLN
4.0000 mg | Freq: Once | INTRAMUSCULAR | Status: AC
  Administered 2017-12-30: 4 mg via INTRAVENOUS

## 2017-12-30 NOTE — ED Provider Notes (Signed)
[facility] Emergency Department Provider Note  ____________________________________________   First MD Initiated Contact with Patient 12/30/17 2303     (approximate)  I have reviewed the triage vital signs and the nursing notes.   HISTORY  Chief Complaint Emesis and Diarrhea   HPI Kristen Grant is a 22 y.o. female self presents to the emergency department with roughly 24 hours of nausea vomiting and diarrhea.  She says she is vomited 4 times today and had 6 loose watery stools.   Her symptoms began suddenly were severe and have slowly improved with time.  She has no sick contacts.  She has cramping diffuse abdominal moderate severity discomfort nonradiating worse with vomiting improved when not vomiting.  She denies dysuria frequency or hesitancy.  Denies back pain.  No vaginal discharge.  No history of abdominal surgeries.  She feels a little bit lightheaded when standing up.  No recent antibiotics.  No recent international travel.  No recent hospitalizations.  No recent shellfish.   Past Medical History:  Diagnosis Date  . Asthma    no treatments required at this time    Patient Active Problem List   Diagnosis Date Noted  . Normal spontaneous vaginal delivery 08/24/2015  . Labor and delivery, indication for care 08/23/2015    Past Surgical History:  Procedure Laterality Date  . NO PAST SURGERIES      Prior to Admission medications   Medication Sig Start Date End Date Taking? Authorizing Provider  ferrous sulfate 325 (65 FE) MG EC tablet Take 325 mg by mouth 3 (three) times daily with meals.    [provider]  ondansetron (ZOFRAN ODT) 4 MG disintegrating tablet Take 1 tablet (4 mg total) by mouth every 8 (eight) hours as needed for nausea or vomiting. 12/30/17   Merrily Brittle, MD    Allergies Patient has no known allergies.  No family history on file.  Social History Social History   Tobacco Use  . Smoking status: Never  Smoker  . Smokeless tobacco: Never Used  Substance Use Topics  . Alcohol use: No  . Drug use: No    Review of Systems Constitutional: No fever/chills Eyes: No visual changes. ENT: No sore throat. Cardiovascular: Denies chest pain. Respiratory: Denies shortness of breath. Gastrointestinal: Positive for abdominal pain.  Positive for nausea, positive for vomiting.  Positive for diarrhea.  No constipation. Genitourinary: Negative for dysuria. Musculoskeletal: Negative for back pain. Skin: Negative for rash. Neurological: Negative for headaches, focal weakness or numbness.   ____________________________________________   PHYSICAL EXAM:  VITAL SIGNS: ED Triage Vitals  Enc Vitals Group     BP 12/30/17 2119 127/69     Pulse Rate 12/30/17 2119 90     Resp 12/30/17 2119 16     Temp 12/30/17 2119 98.4 F (36.9 C)     Temp Source 12/30/17 2119 Oral     SpO2 12/30/17 2119 99 %     Weight 12/30/17 2115 190 lb (86.2 kg)     Height 12/30/17 2115 5\' 9"  (1.753 m)     Head Circumference --      Peak Flow --      Pain Score 12/30/17 2115 0     Pain Loc --      Pain Edu? --      Excl. in GC? --     Constitutional: Alert and oriented x4 appears obviously nauseated and uncomfortable although nontoxic Eyes: PERRL EOMI. Head: Atraumatic. Nose: No congestion/rhinnorhea. Mouth/Throat: No trismus  Neck: No stridor.   Cardiovascular: Normal rate, regular rhythm. Grossly normal heart sounds.  Good peripheral circulation. Respiratory: Normal respiratory effort.  No retractions. Lungs CTAB and moving good air Gastrointestinal: Soft mild diffuse tenderness with no focality no rebound or guarding no peritonitis Musculoskeletal: No lower extremity edema   Neurologic:  Normal speech and language. No gross focal neurologic deficits are appreciated. Skin:  Skin is warm, dry and intact. No rash noted. Psychiatric: Mood and affect are normal. Speech and behavior are  normal.    ____________________________________________   DIFFERENTIAL includes but not limited to  Food poisoning, viral gastroenteritis, bacterial gastroenteritis, C. difficile, appendicitis, diverticulitis ____________________________________________   LABS (all labs ordered are listed, but only abnormal results are displayed)  Labs Reviewed  COMPREHENSIVE METABOLIC PANEL - Abnormal; Notable for the following components:      Result Value   Creatinine, Ser 0.43 (*)    Albumin 5.1 (*)    Total Bilirubin 2.2 (*)    All other components within normal limits  CBC - Abnormal; Notable for the following components:   WBC 11.3 (*)    RBC 5.31 (*)    All other components within normal limits  URINALYSIS, COMPLETE (UACMP) WITH MICROSCOPIC - Abnormal; Notable for the following components:   Color, Urine YELLOW (*)    APPearance CLOUDY (*)    Hgb urine dipstick SMALL (*)    Ketones, ur 5 (*)    All other components within normal limits  LIPASE, BLOOD  POC URINE PREG, ED    Lab work reviewed by me shows slight ketosis but otherwise unremarkable.  Very slight elevation in white count likely secondary to pain.  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   As part of my medical decision making, I reviewed the following data within the electronic MEDICAL RECORD NUMBER History obtained from family if available, nursing notes, old chart and  ekg, as well as notes from prior ED visits.  Patient comes to the emergency department quite nauseated with vomiting and diarrhea.  She is slightly ketotic.  Given a total of 3 doses of IV Zofran and 1 dose of loperamide and a liter of IV fluid with significant improvement in her symptoms.  Her abdominal exam remains benign and I do not believe she warrants advanced imaging at this point.  We discussed that it certainly could represent early appendicitis and that she if she does not feel better within 24 hours or if she is unable to eat or drink or for pain recurs she is to return to the emergency department.  Strict return precautions have been given.  I will give her Zofran for home as this should also help with her diarrhea.      ____________________________________________   FINAL CLINICAL IMPRESSION(S) / ED DIAGNOSES  Final diagnoses:  Dehydration  Nausea vomiting and diarrhea      NEW MEDICATIONS STARTED DURING THIS VISIT:  Discharge Medication List as of 12/30/2017 11:14 PM    START taking these medications   Details  ondansetron (ZOFRAN ODT) 4 MG disintegrating tablet Take 1 tablet (  4 mg total) by mouth every 8 (eight) hours as needed for nausea or vomiting., Starting Thu 12/30/2017, Print         Note:  This document was prepared using Dragon voice recognition software and may include unintentional dictation errors.     Merrily Brittle, MD 01/01/18 2232

## 2017-12-30 NOTE — ED Triage Notes (Signed)
Pt presents to ED via POV with c/o emesis and diarrhea, reports 4 episodes of vomiting and 6 episodes of diarrhea. Pt is alert and oriented, ambulatory without difficulty, mucous membranes noted to be moist in triage.

## 2017-12-30 NOTE — Discharge Instructions (Addendum)
Today your blood work showed you're a little bit dehydrated.  You also had quite a bit of pain in your right lower abdomen and I was worried you might have early appendicitis and recommended a CT scan to fully evaluate.  It's a little bit uncommon for a stomach flu to last 3 days, so if you're not feeling back to your usual self tomorrow morning please come back for a recheck.  Return to the ED sooner for any concerns such as fevers, chills, worsening pain, if you cannot eat or drink, or for any other issues whatsoever.  It was a pleasure to take care of you today, and thank you for coming to our emergency department.  If you have any questions or concerns before leaving please ask the nurse to grab me and I'm more than happy to go through your aftercare instructions again.  If you were prescribed any opioid pain medication today such as Norco, Vicodin, Percocet, morphine, hydrocodone, or oxycodone please make sure you do not drive when you are taking this medication as it can alter your ability to drive safely.  If you have any concerns once you are home that you are not improving or are in fact getting worse before you can make it to your follow-up appointment, please do not hesitate to call 911 and come back for further evaluation.  Kristen BrittleNeil Jarvis Knodel, MD  Results for orders placed or performed during the hospital encounter of 12/30/17  Lipase, blood  Result Value Ref Range   Lipase 25 11 - 51 U/L  Comprehensive metabolic panel  Result Value Ref Range   Sodium 140 135 - 145 mmol/L   Potassium 3.8 3.5 - 5.1 mmol/L   Chloride 109 98 - 111 mmol/L   CO2 23 22 - 32 mmol/L   Glucose, Bld 98 70 - 99 mg/dL   BUN 9 6 - 20 mg/dL   Creatinine, Ser 4.090.43 (L) 0.44 - 1.00 mg/dL   Calcium 9.1 8.9 - 81.110.3 mg/dL   Total Protein 7.9 6.5 - 8.1 g/dL   Albumin 5.1 (H) 3.5 - 5.0 g/dL   AST 19 15 - 41 U/L   ALT 14 0 - 44 U/L   Alkaline Phosphatase 68 38 - 126 U/L   Total Bilirubin 2.2 (H) 0.3 - 1.2 mg/dL   GFR  calc non Af Amer >60 >60 mL/min   GFR calc Af Amer >60 >60 mL/min   Anion gap 8 5 - 15  CBC  Result Value Ref Range   WBC 11.3 (H) 4.0 - 10.5 K/uL   RBC 5.31 (H) 3.87 - 5.11 MIL/uL   Hemoglobin 14.0 12.0 - 15.0 g/dL   HCT 91.443.6 78.236.0 - 95.646.0 %   MCV 82.1 80.0 - 100.0 fL   MCH 26.4 26.0 - 34.0 pg   MCHC 32.1 30.0 - 36.0 g/dL   RDW 21.315.5 08.611.5 - 57.815.5 %   Platelets 328 150 - 400 K/uL   nRBC 0.0 0.0 - 0.2 %  Urinalysis, Complete w Microscopic  Result Value Ref Range   Color, Urine YELLOW (A) YELLOW   APPearance CLOUDY (A) CLEAR   Specific Gravity, Urine 1.019 1.005 - 1.030   pH 5.0 5.0 - 8.0   Glucose, UA NEGATIVE NEGATIVE mg/dL   Hgb urine dipstick SMALL (A) NEGATIVE   Bilirubin Urine NEGATIVE NEGATIVE   Ketones, ur 5 (A) NEGATIVE mg/dL   Protein, ur NEGATIVE NEGATIVE mg/dL   Nitrite NEGATIVE NEGATIVE   Leukocytes, UA NEGATIVE NEGATIVE  RBC / HPF 0-5 0 - 5 RBC/hpf   WBC, UA 0-5 0 - 5 WBC/hpf   Bacteria, UA NONE SEEN NONE SEEN   Squamous Epithelial / LPF 6-10 0 - 5   Mucus PRESENT   POC urine preg, ED  Result Value Ref Range   Preg Test, Ur Negative Negative

## 2018-08-10 ENCOUNTER — Telehealth: Payer: Self-pay

## 2018-08-10 NOTE — Telephone Encounter (Signed)
TC with patient re: need for repeat pap. Patient doesn't have work schedule for next week. Will call RN back to schedule pap ...................................................................Aileen Fass, RN  July 11, 2018 11:18 AM   PAP LGSIL 07/07/17- needs repeat pap

## 2018-08-18 ENCOUNTER — Ambulatory Visit: Payer: Self-pay

## 2018-08-18 ENCOUNTER — Telehealth: Payer: Self-pay

## 2018-08-18 ENCOUNTER — Ambulatory Visit (LOCAL_COMMUNITY_HEALTH_CENTER): Payer: BC Managed Care – PPO | Admitting: Physician Assistant

## 2018-08-18 ENCOUNTER — Other Ambulatory Visit: Payer: Self-pay

## 2018-08-18 VITALS — BP 103/73 | Wt 175.6 lb

## 2018-08-18 DIAGNOSIS — Z3009 Encounter for other general counseling and advice on contraception: Secondary | ICD-10-CM | POA: Diagnosis not present

## 2018-08-18 DIAGNOSIS — Z30013 Encounter for initial prescription of injectable contraceptive: Secondary | ICD-10-CM | POA: Diagnosis not present

## 2018-08-18 DIAGNOSIS — R87612 Low grade squamous intraepithelial lesion on cytologic smear of cervix (LGSIL): Secondary | ICD-10-CM | POA: Diagnosis not present

## 2018-08-18 DIAGNOSIS — Z3042 Encounter for surveillance of injectable contraceptive: Secondary | ICD-10-CM | POA: Insufficient documentation

## 2018-08-18 NOTE — Telephone Encounter (Signed)
TC back to patient.  Rescheduled for 1:40pm this afternoon.

## 2018-08-18 NOTE — Telephone Encounter (Signed)
TC to patient re: need for repeat pap. Pt work schedule makes it difficult to come in for appts.  Patient scheduled for 11am appt today, patient is off of work today. Aileen Fass, RN

## 2018-08-18 NOTE — Progress Notes (Signed)
Patient ID: Kristen Grant, female   DOB: 1995-10-12, 23 y.o.   MRN: [id number] Pt requests repeat Pap and STI screen. Feels well, no concerns. Broke up with longtime boyfriend a month ago, no other sexual partners. Had LSIL Pap 07/2017.  No vag discharge/itch/odor. Pap test repeated today.

## 2018-08-18 NOTE — Progress Notes (Signed)
Patient here today for a repeat Pap Smear. Current birth control is Depo with last received 06/14/2018 (9.2 weeks post Depo.) Would like to discuss period concerns. Hal Morales, RN

## 2018-08-18 NOTE — Assessment & Plan Note (Signed)
To f/u for next DMPA injection as previously planned.

## 2018-08-24 LAB — PAP IG (IMAGE GUIDED): PAP Smear Comment: 0

## 2018-09-08 ENCOUNTER — Telehealth: Payer: Self-pay | Admitting: Family Medicine

## 2018-09-08 NOTE — Telephone Encounter (Signed)
Wants depo, was due 08/30/2018, next appt available 09/20/2018

## 2018-09-08 NOTE — Telephone Encounter (Signed)
TC to patient. Counseled that Depo is still effective up to 16 weeks post injection. Offered patient work in appointment for Depo sooner than 8/18 vs an appointment on 8/18 (14 weeks post injection). Patient opts for appointment on 8/18 with arrival time of 3:55pm. Appointment scheduled.

## 2018-09-20 ENCOUNTER — Ambulatory Visit (LOCAL_COMMUNITY_HEALTH_CENTER): Payer: BC Managed Care – PPO

## 2018-09-20 ENCOUNTER — Other Ambulatory Visit: Payer: Self-pay

## 2018-09-20 VITALS — BP 124/86 | Ht 68.0 in | Wt 172.0 lb

## 2018-09-20 DIAGNOSIS — Z30013 Encounter for initial prescription of injectable contraceptive: Secondary | ICD-10-CM | POA: Diagnosis not present

## 2018-09-20 DIAGNOSIS — Z3009 Encounter for other general counseling and advice on contraception: Secondary | ICD-10-CM

## 2018-09-20 MED ORDER — MEDROXYPROGESTERONE ACETATE 150 MG/ML IM SUSP
150.0000 mg | Freq: Once | INTRAMUSCULAR | Status: AC
  Administered 2018-09-20: 150 mg via INTRAMUSCULAR

## 2018-09-20 NOTE — Progress Notes (Signed)
Depo given per SO that allows 1 Depo after RP has expired. Patient instructed to make appt for Depo and RP 12/06/2018. Aileen Fass, RN

## 2018-12-26 ENCOUNTER — Other Ambulatory Visit: Payer: Self-pay

## 2018-12-26 ENCOUNTER — Ambulatory Visit (LOCAL_COMMUNITY_HEALTH_CENTER): Payer: BC Managed Care – PPO | Admitting: Advanced Practice Midwife

## 2018-12-26 ENCOUNTER — Encounter: Payer: Self-pay | Admitting: Advanced Practice Midwife

## 2018-12-26 VITALS — BP 119/71 | Ht 69.0 in | Wt 169.0 lb

## 2018-12-26 DIAGNOSIS — F411 Generalized anxiety disorder: Secondary | ICD-10-CM

## 2018-12-26 DIAGNOSIS — Z3009 Encounter for other general counseling and advice on contraception: Secondary | ICD-10-CM | POA: Diagnosis not present

## 2018-12-26 DIAGNOSIS — Z30013 Encounter for initial prescription of injectable contraceptive: Secondary | ICD-10-CM

## 2018-12-26 DIAGNOSIS — F17200 Nicotine dependence, unspecified, uncomplicated: Secondary | ICD-10-CM | POA: Insufficient documentation

## 2018-12-26 DIAGNOSIS — Z3042 Encounter for surveillance of injectable contraceptive: Secondary | ICD-10-CM

## 2018-12-26 DIAGNOSIS — Z113 Encounter for screening for infections with a predominantly sexual mode of transmission: Secondary | ICD-10-CM

## 2018-12-26 DIAGNOSIS — R87612 Low grade squamous intraepithelial lesion on cytologic smear of cervix (LGSIL): Secondary | ICD-10-CM

## 2018-12-26 LAB — WET PREP FOR TRICH, YEAST, CLUE
Trichomonas Exam: NEGATIVE
Yeast Exam: NEGATIVE

## 2018-12-26 MED ORDER — MEDROXYPROGESTERONE ACETATE 150 MG/ML IM SUSP
150.0000 mg | Freq: Once | INTRAMUSCULAR | Status: AC
  Administered 2018-12-26: 150 mg via INTRAMUSCULAR

## 2018-12-26 NOTE — Progress Notes (Signed)
   [place] problem visit  [place] Department  Subjective:  CARLETHA DAWN is a 23 y.o.SWF G1P1 smoker being seen today for DMPA and STD screen  Chief Complaint  Patient presents with  . Contraception    Depo  . SEXUALLY TRANSMITTED DISEASE    STD screening    HPI States has new partner of 4 months and has postcoital spotting with him.  LMP 3 mo ago.  Last sex 12/24/18 without condom.  Last physical 07/07/2017.  Last DMPA 09/20/18.  Last pap 07/07/17 LGSIL; 08/18/18 wnl.  Has lost 30 lbs due to depression and not eating  Does the patient have a current or past history of drug use? No   No components found for: HCV]   Health Maintenance Due  Topic Date Due  . INFLUENZA VACCINE  09/03/2018    ROS  The following portions of the patient's history were reviewed and updated as appropriate: allergies, current medications, past family history, past medical history, past social history, past surgical history and problem list. Problem list updated.   See flowsheet for other program required questions.  Objective:   Vitals:   12/26/18 0936  BP: 119/71  Weight: 169 lb (76.7 kg)  Height: 5\' 9"  (1.753 m)    Physical Exam Constitutional:      Appearance: Normal appearance. She is normal weight.  HENT:     Head: Normocephalic.     Mouth/Throat:     Mouth: Mucous membranes are moist.  Eyes:     Conjunctiva/sclera: Conjunctivae normal.  Abdominal:     Palpations: Abdomen is soft.     Comments: Soft without tenderness; +striae  Genitourinary:    General: Normal vulva.     Exam position: Lithotomy position.     Vagina: Vaginal discharge (thick white cottage cheese leukorrhea, ph<4.5) present.     Rectum: Normal.  Skin:    General: Skin is warm and dry.       Assessment and Plan:  TONESHA TSOU is a 23 y.o. female presenting to the [facility] Department for a Women's Health problem visit  1. LGSIL on Pap smear of cervix 08/18/18  pap wnl  2. Screening examination for venereal disease Treat wet mount per standing orders Immunization nurse consult - WET PREP FOR [place], YEAST, [facility]  3. Family planning May have DMPA 150 mg IM today Please give pt Galvin Proffer card with # for counseling Counseled to stop smoking 1-2 Black & Milds/day - medroxyPROGESTERone (DEPO-PROVERA) injection 150 mg  4. Encounter for surveillance of injectable contraceptive      Return for 11-13 wk DMPA.  No future appointments.  Herbie Saxon, CNM

## 2018-12-26 NOTE — Progress Notes (Signed)
[place]et [place] results reviewed. Per standing orders no treatment indicated. Depo given and tolerated well. Jazmin Vensel, RN  

## 2018-12-26 NOTE — Progress Notes (Addendum)
Here today for Depo and STD screening. Per Centricity, last PE, CBE and Pap Smear was 07/07/2017. Pap Smear results was LGSIL without HPV testing. Repeat Pap Smear was 08/18/2018 with NIL results. Last Depo was 09/20/2018 (13.6 weeks.) Declines bloodwork. Hal Morales, RN

## 2019-05-02 ENCOUNTER — Ambulatory Visit: Payer: BC Managed Care – PPO

## 2019-05-09 ENCOUNTER — Encounter: Payer: Self-pay | Admitting: Family Medicine

## 2019-05-09 ENCOUNTER — Ambulatory Visit (LOCAL_COMMUNITY_HEALTH_CENTER): Payer: BC Managed Care – PPO | Admitting: Family Medicine

## 2019-05-09 ENCOUNTER — Other Ambulatory Visit: Payer: Self-pay

## 2019-05-09 VITALS — BP 114/71 | Ht 69.0 in | Wt 183.8 lb

## 2019-05-09 DIAGNOSIS — Z3009 Encounter for other general counseling and advice on contraception: Secondary | ICD-10-CM

## 2019-05-09 DIAGNOSIS — Z30013 Encounter for initial prescription of injectable contraceptive: Secondary | ICD-10-CM

## 2019-05-09 DIAGNOSIS — G43909 Migraine, unspecified, not intractable, without status migrainosus: Secondary | ICD-10-CM | POA: Insufficient documentation

## 2019-05-09 DIAGNOSIS — Z113 Encounter for screening for infections with a predominantly sexual mode of transmission: Secondary | ICD-10-CM

## 2019-05-09 LAB — WET PREP FOR TRICH, YEAST, CLUE
Trichomonas Exam: NEGATIVE
Yeast Exam: NEGATIVE

## 2019-05-09 MED ORDER — METRONIDAZOLE 500 MG PO TABS: 500.0000 mg | ORAL_TABLET | Freq: Two times a day (BID) | ORAL | 0 refills | Status: AC

## 2019-05-09 MED ORDER — MULTIVITAMINS PO CAPS: 1.0000 | ORAL_CAPSULE | Freq: Every day | ORAL | 0 refills | Status: AC

## 2019-05-09 MED ORDER — MEDROXYPROGESTERONE ACETATE 150 MG/ML IM SUSP
150.0000 mg | INTRAMUSCULAR | Status: AC
  Administered 2019-05-09 – 2019-12-06 (×2): 150 mg via INTRAMUSCULAR

## 2019-05-09 NOTE — Progress Notes (Signed)
Family Planning Visit  Subjective:  Kristen Grant is a 24 y.o. being seen today for  Chief Complaint  Patient presents with  . Contraception    Depo    Pt has LGSIL on Pap smear of cervix; Encounter for surveillance of injectable contraceptive; Obesity (BMI 30-39.9); Generalized anxiety disorder with panic attacks; ADD (attention deficit disorder); Smoker 1-2 Black & Milds/day; and Migraines on their problem list.  HPI  Patient reports she is here for depo and STI testing. Last depo inj [redacted]w[redacted]d ago, couldn't make last appt. She has been having white discharge since last period.   Pt denies all of the following, which are contraindications to Depo use: Known breast cancer Pregnancy Also denies: Hypertension (CDC cat 2 if mild, cat 3 if severe) Severe cirrhosis, hepatocellular adenoma Diabetes with nephrosis or vascular complications Ischemic heart disease or multiple risk factors for atherosclerotic disease, and some forms of lupus Unexplained vaginal bleeding Pregnancy planned within the next year Long-term use of corticosteroid therapy in women with a history of, or risk factors for, nontraumatic (frailty) fractures.  Current use of aminoglutethimide (usually for the treatment of Cushing's syndrome) because aminoglutethimide may increase metabolism of progestins   Pt endorses using tobacco, MJ and coffee for both migraines and anxiety.   Patient's last menstrual period was 03/06/2019 (approximate). Last sex: Feb 10 BCM: depo Pt desires EC? n/a  Last pap: 08/18/18= normal, 07/07/2017=LGSIL. Needs repeat pap in 3 months.  Last breast exam: never  Patient reports 2 partner(s) in last year. Do they desire STI screening (if no, why not)? Yes, declines HIV and syphilis  Does the patient desire a pregnancy in the next year? no   24 y.o., Body mass index is 27.14 kg/m. - Is patient eligible for HA1C diabetes screening based on BMI and age >62?  no  Does the patient have a current  or past history of drug use? yes No components found for: HCV  See flowsheet for other program required questions.   There are no preventive care reminders to display for this patient.  ROS  The following portions of the patient's history were reviewed and updated as appropriate: allergies, current medications, past family history, past medical history, past social history, past surgical history and problem list. Problem list updated.  Objective:  BP 114/71   Ht 5\' 9"  (1.753 m)   Wt 183 lb 12.8 oz (83.4 kg)   LMP 03/06/2019 (Approximate)   BMI 27.14 kg/m    Physical Exam Vitals and nursing note reviewed.  Constitutional:      Appearance: Normal appearance.  HENT:     Head: Normocephalic and atraumatic.     Mouth/Throat:     Mouth: Mucous membranes are moist.     Pharynx: Oropharynx is clear. No oropharyngeal exudate or posterior oropharyngeal erythema.  Pulmonary:     Effort: Pulmonary effort is normal.  Abdominal:     General: Abdomen is flat.     Palpations: There is no mass.     Tenderness: There is no abdominal tenderness. There is no rebound.  Genitourinary:    General: Normal vulva.     Exam position: Lithotomy position.     Pubic Area: No rash or pubic lice.      Labia:        Right: No rash or lesion.        Left: No rash or lesion.      Vagina: Vaginal discharge (white, moderate amt, ph>4.5) present. No erythema, bleeding or  lesions.     Cervix: No cervical motion tenderness, discharge, friability, lesion or erythema.     Uterus: Normal.      Adnexa: Right adnexa normal and left adnexa normal.     Rectum: Normal.  Lymphadenopathy:     Head:     Right side of head: No preauricular or posterior auricular adenopathy.     Left side of head: No preauricular or posterior auricular adenopathy.     Cervical: No cervical adenopathy.     Upper Body:     Right upper body: No supraclavicular or axillary adenopathy.     Left upper body: No supraclavicular or axillary  adenopathy.     Lower Body: No right inguinal adenopathy. No left inguinal adenopathy.  Skin:    General: Skin is warm and dry.     Findings: No rash.  Neurological:     Mental Status: She is alert and oriented to person, place, and time.      Assessment and Plan:  Kristen Grant is a 23 y.o. female presenting to the [facility] Department for a well woman exam/family planning visit  Contraception counseling: Reviewed all forms of birth control options in the tiered based approach including abstinence; over the counter/barrier methods; hormonal contraceptive medication including pill, patch, ring, injection, contraceptive implant; hormonal and nonhormonal IUDs; permanent sterilization options including vasectomy and the various tubal sterilization modalities. Risks, benefits, how to discontinue and typical effectiveness rates were reviewed.  Questions were answered.  Written information was also given to the patient to review.  Patient desires Depo, this was prescribed for patient. She will follow up in  3 months for surveillance.  She was told to call with any further questions, or with any concerns about this method of contraception.  Emphasized use of condoms 100% of the time for STI prevention.  Emergency Contraception: n/a    1. Family planning services -Rx depo x1 yr. Extensive discussion on all BCM d/t difficulty RTC for depo. Pt possibly interested in IUD. [employer] website discussed, info given -Pap due in July, 2021 - pt states she can return for this at that time. -PCP list given - advised to f/u regarding migraines, anxiety. Offered [facility] counselor for anxiety, pt declines. -Tob quitline info given.  - medroxyPROGESTERone (DEPO-PROVERA) injection 150 mg  2. Screening examination for venereal disease -Pt with symptoms. Screenings today as below. Treat wet prep per standing order. -Patient does meet criteria for HepB, HepC Screening. Declines these screenings and  HIV and syphilis screenings. -Counseled on warning s/sx and when to seek care. Recommended condom use with all sex and discussed importance of condom use for STI prevention. - WET PREP FOR TRICH, YEAST, CLUE - Chlamydia/Gonorrhea [facility]    Return in about 3 months (around 08/08/2019) for pap, Depo.  No future appointments.  Ann Held, PA-C

## 2019-05-09 NOTE — Progress Notes (Signed)
[employer] results reviewed. Patient treated for BV per standing orders. Condoms and MVI's given. Tolerated Depo well. Tawny Hopping, RN

## 2019-05-09 NOTE — Progress Notes (Signed)
Patient here today for Depo at 57 1/7 since last Depo. States she missed her last Depo in February and bled heavily for about 2 weeks in February, and continual spotting for about 6 weeks. Has concerns about bleeding and wants to discuss BCM. Also desires STD testing.Burt Knack, RN

## 2019-08-24 ENCOUNTER — Telehealth: Payer: Self-pay

## 2019-08-24 NOTE — Telephone Encounter (Signed)
Telephone call to patient today regarding the need for a repeat PAP and physical due in July.  Left message to return my call.  Kristen Grant was available to assist scheduling appointment in Epic. Hart Carwin, RN

## 2019-08-24 NOTE — Telephone Encounter (Signed)
Return call by patient during lunch.

## 2019-08-31 NOTE — Telephone Encounter (Signed)
Telephone call to patient today regarding the need for a repeat PAP.  Appointment scheduled for 09/06/2019 at 4pm (arrival time 3:30pm).  Patient also desires her Depo at that time.  Marigene Ehlers assisted with scheduling appointment in Epic.  Note put into Epic regarding DEPO at same visit. Hart Carwin, RN

## 2019-09-06 ENCOUNTER — Ambulatory Visit: Payer: BC Managed Care – PPO

## 2019-10-06 ENCOUNTER — Encounter: Payer: Self-pay | Admitting: Advanced Practice Midwife

## 2019-10-06 ENCOUNTER — Other Ambulatory Visit: Payer: Self-pay

## 2019-10-06 ENCOUNTER — Ambulatory Visit (LOCAL_COMMUNITY_HEALTH_CENTER): Payer: Self-pay | Admitting: Advanced Practice Midwife

## 2019-10-06 VITALS — BP 116/77 | Ht 69.5 in | Wt 192.8 lb

## 2019-10-06 DIAGNOSIS — Z720 Tobacco use: Secondary | ICD-10-CM

## 2019-10-06 DIAGNOSIS — R87612 Low grade squamous intraepithelial lesion on cytologic smear of cervix (LGSIL): Secondary | ICD-10-CM

## 2019-10-06 DIAGNOSIS — Z3009 Encounter for other general counseling and advice on contraception: Secondary | ICD-10-CM

## 2019-10-06 DIAGNOSIS — Z30011 Encounter for initial prescription of contraceptive pills: Secondary | ICD-10-CM

## 2019-10-06 DIAGNOSIS — F1510 Other stimulant abuse, uncomplicated: Secondary | ICD-10-CM | POA: Insufficient documentation

## 2019-10-06 HISTORY — DX: Tobacco use: Z72.0

## 2019-10-06 LAB — WET PREP FOR TRICH, YEAST, CLUE
Trichomonas Exam: NEGATIVE
Yeast Exam: NEGATIVE

## 2019-10-06 LAB — PREGNANCY, URINE: Preg Test, Ur: NEGATIVE

## 2019-10-06 MED ORDER — NORETHINDRONE 0.35 MG PO TABS: 1.0000 | ORAL_TABLET | Freq: Every day | ORAL | 12 refills | Status: DC

## 2019-10-06 MED ORDER — METRONIDAZOLE 500 MG PO TABS: 500.0000 mg | ORAL_TABLET | Freq: Two times a day (BID) | ORAL | 0 refills | Status: AC

## 2019-10-06 NOTE — Progress Notes (Signed)
Patient here for Pap follow-up, has been using Depo, last Depo 05/09/19, 21 3/7 weeks since last Depo. Last PE per Cent[facility]ity was 07/07/2017, pap LSIL. Had FP visit 05/09/19. Unsure if that is considered a yearly PE, see active FYIS. Considering switching from Depo to OC, and is unsure about near future pregnancy. Burt Knack, RN

## 2019-10-06 NOTE — Progress Notes (Signed)
[facility] DEPARTMENT [facility] [address] Main Number: [phone]    Family Planning Visit- Initial Visit  Subjective:  Kristen Grant is a 24 y.o. SWF  [id number] (61 yo son)   being seen today for an initial well woman visit and to discuss family planning options.  She is currently using None for pregnancy prevention. Patient reports she does not know want a pregnancy in the next year.  Patient has the following medical conditions has LGSIL on Pap smear of cervix; Encounter for surveillance of injectable contraceptive; Obesity (BMI 30-39.9); Generalized anxiety disorder with panic attacks; ADD (attention deficit disorder); Smoker 1-2 Black & Milds/day; and Migraines on their problem list.  Chief Complaint  Patient presents with  . Contraception  . Abnormal Pap Smear  . Annual Exam    Patient reports vapes daily. Employed 38 hrs/wk.  Lives with son.  LMP 09/18/19.  Last sex 10/03/19 without condom; with current partner x 4 mo; 1 sex partner in last 3 mo.  Last MJ 3 mo ago.  Last ETOH yesterday (2 glasses wine) 1-2x/mo.  Drinks 2-4 c. Coffee/day, 4-6 glasses tea/day, 1-2 (12 oz cans) Mtn. Dew/day.  Last physical 07/07/17.  Last DMPA 05/09/19.  Pap 08/18/2018 no endocervical component so needs pap repeat today.    Patient denies cigars since 09/2019  Body mass index is 28.06 kg/m. - Patient is eligible for diabetes screening based on BMI and age >66?  not applicable HA1C ordered? not applicable  Patient reports 2 of partners in last year. Desires STI screening?  Yes  Has patient been screened once for HCV in the past?  No  No results found for: HCVAB  Does the patient have current drug use (including MJ), have a partner with drug use, and/or has been incarcerated since last result? Yes  If yes-- Screen for HCV through [facility]   Does the patient meet criteria for HBV testing? No  Criteria:  -Household, sexual or needle sharing contact with  HBV -History of drug use -HIV positive -Those with known Hep C   Health Maintenance Due  Topic Date Due  . Hepatitis C Screening  Never done  . COVID-19 Vaccine (1) Never done  . INFLUENZA VACCINE  Never done  . PAP SMEAR-Modifier  08/18/2019    Review of Systems  Constitutional: Positive for weight loss (weight gain 10-15 lbs in 2-3 mo).  Eyes: Positive for blurred vision (with h/a).  Respiratory: Positive for shortness of breath (asthma).   Genitourinary: Positive for frequency (urinary frequency).  Neurological: Positive for dizziness (2-3x/wk with h/a) and headaches (migraines daily different places on head relieved with 6 Ibuprofen or sleep, -N&V, +tingling left hand and both legs, -auditory, blurry vision ).  All other systems reviewed and are negative.   The following portions of the patient's history were reviewed and updated as appropriate: allergies, current medications, past family history, past medical history, past social history, past surgical history and problem list. Problem list updated.   See flowsheet for other program required questions.  Objective:   Vitals:   10/06/19 1031  BP: 116/77  Weight: 192 lb 12.8 oz (87.5 kg)  Height: 5' 9.5" (1.765 m)    Physical Exam Constitutional:      Appearance: Normal appearance. She is obese.  HENT:     Head: Normocephalic and atraumatic.     Mouth/Throat:     Mouth: Mucous membranes are moist.  Eyes:     Conjunctiva/sclera:  Conjunctivae normal.  Cardiovascular:     Rate and Rhythm: Normal rate and regular rhythm.  Pulmonary:     Effort: Pulmonary effort is normal.     Breath sounds: Normal breath sounds.  Abdominal:     Palpations: Abdomen is soft.     Comments: Soft without tenderness, poor tone, increased adipose  Genitourinary:    General: Normal vulva.     Exam position: Lithotomy position.     Vagina: Vaginal discharge (moderate white creamy leukorrhea, ph>4.5) present.     Cervix: Friability  (friable to pap; c/o postcoital bleeding) present.     Uterus: Normal.      Adnexa: Right adnexa normal and left adnexa normal.     Rectum: Normal.  Musculoskeletal:        General: Normal range of motion.     Cervical back: Normal range of motion and neck supple.  Skin:    General: Skin is warm and dry.  Neurological:     Mental Status: She is alert.  Psychiatric:        Mood and Affect: Mood normal.       Assessment and Plan:  Kristen Grant is a 24 y.o. female presenting to the [facility] Department for an initial well woman exam/family planning visit  Contraception counseling: Reviewed all forms of birth control options in the tiered based approach. available including abstinence; over the counter/barrier methods; hormonal contraceptive medication including pill, patch, ring, injection,contraceptive implant, ECP; hormonal and nonhormonal IUDs; permanent sterilization options including vasectomy and the various tubal sterilization modalities. Risks, benefits, and typical effectiveness rates were reviewed.  Questions were answered.  Written information was also given to the patient to review.  Patient desires ocp's, this was prescribed for patient. She will follow up in 1 year for surveillance.  She was told to call with any further questions, or with any concerns about this method of contraception.  Emphasized use of condoms 100% of the time for STI prevention.  Patient was offered ECP. ECP was not accepted by the patient. ECP counseling was not given - see RN documentation  1. Family planning Please give Kristen Grant contact card Treat wet mount per standing orders Immunization nurse consult - WET PREP FOR TRICH, YEAST, CLUE - Pregnancy, urine - Chlamydia/Gonorrhea [facility] - Syphilis Serology, [facility] - HIV/HCV [facility] - Pap IG (Image Guided) - Ambulatory referral to Behavioral Health  2. LGSIL on Pap smear of cervix Last pap 08/18/18 with no  endocervical component.  If today's pap is wnl then won't need another pap for 3 years  3. Encounter for initial prescription of contraceptive pills Pt desires ocp's--may have Micronor #13 I po daily to begin today if PT neg today (migraines) Please couonsel on need for abstinance/back up condoms next 7 days Pt counseled on possibility she is early pregnant and needs to do PT 10/17/19 and call if + Pt refuses ECP today     No follow-ups on file.  No future appointments.  Alberteen Spindle, CNM

## 2019-10-06 NOTE — Progress Notes (Addendum)
[employer] results reviewed. Patient treated for BV per standing orders. PT negative. Reminded patient to repeat a home pregnancy test in 2 weeks. Tawny Hopping, RN

## 2019-10-11 LAB — PAP IG (IMAGE GUIDED): PAP Smear Comment: 0

## 2019-10-16 ENCOUNTER — Telehealth: Payer: Self-pay | Admitting: Family Medicine

## 2019-10-16 NOTE — Telephone Encounter (Signed)
Call to patient to discuss positive TR.  Patient verified by password.  Patient informed of + CT/ GC result.  Patient schedueld for treatment appointment.  Patient verbalizes understanding at this time and has no further questions.  Wendi Snipes, RN

## 2019-10-17 ENCOUNTER — Encounter: Payer: Self-pay | Admitting: Family Medicine

## 2019-10-17 LAB — HM HIV SCREENING LAB: HM HIV Screening: NEGATIVE

## 2019-10-17 LAB — HM HEPATITIS C SCREENING LAB: HM Hepatitis Screen: NEGATIVE

## 2019-10-18 ENCOUNTER — Other Ambulatory Visit: Payer: Self-pay

## 2019-10-18 ENCOUNTER — Ambulatory Visit: Payer: Self-pay

## 2019-10-18 DIAGNOSIS — A749 Chlamydial infection, unspecified: Secondary | ICD-10-CM

## 2019-10-18 DIAGNOSIS — A549 Gonococcal infection, unspecified: Secondary | ICD-10-CM

## 2019-10-18 MED ORDER — AZITHROMYCIN 500 MG PO TABS
1000.0000 mg | ORAL_TABLET | Freq: Once | ORAL | Status: AC
  Administered 2019-10-18: 1000 mg via ORAL

## 2019-10-18 MED ORDER — CEFTRIAXONE SODIUM 250 MG IJ SOLR
500.0000 mg | Freq: Once | INTRAMUSCULAR | Status: AC
  Administered 2019-10-18: 500 mg via INTRAMUSCULAR

## 2019-11-16 ENCOUNTER — Ambulatory Visit: Payer: Self-pay | Admitting: Licensed Clinical Social Worker
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
## 2019-12-06 ENCOUNTER — Encounter: Payer: Self-pay | Admitting: Family Medicine

## 2019-12-06 ENCOUNTER — Other Ambulatory Visit: Payer: Self-pay

## 2019-12-06 ENCOUNTER — Ambulatory Visit (LOCAL_COMMUNITY_HEALTH_CENTER): Payer: Managed Care, Other (non HMO) | Admitting: Family Medicine

## 2019-12-06 VITALS — BP 111/73 | Ht 67.0 in | Wt 200.0 lb

## 2019-12-06 DIAGNOSIS — Z3200 Encounter for pregnancy test, result unknown: Secondary | ICD-10-CM

## 2019-12-06 DIAGNOSIS — Z3009 Encounter for other general counseling and advice on contraception: Secondary | ICD-10-CM | POA: Diagnosis not present

## 2019-12-06 DIAGNOSIS — Z3049 Encounter for surveillance of other contraceptives: Secondary | ICD-10-CM

## 2019-12-06 DIAGNOSIS — Z30013 Encounter for initial prescription of injectable contraceptive: Secondary | ICD-10-CM

## 2019-12-06 LAB — PREGNANCY, URINE: Preg Test, Ur: NEGATIVE

## 2019-12-06 MED ORDER — MEDROXYPROGESTERONE ACETATE 150 MG/ML IM SUSP: 150.0000 mg | INTRAMUSCULAR | 0 refills | Status: DC

## 2019-12-06 MED ORDER — ULIPRISTAL ACETATE 30 MG PO TABS
30.0000 mg | ORAL_TABLET | Freq: Once | ORAL | Status: AC
  Administered 2019-12-06: 30 mg via ORAL

## 2019-12-06 NOTE — Progress Notes (Signed)
Patient here in 10/2019 for physical and received pills but did not taken them and wanted to take depo instead. Last sex n 12/02/2019 without condom.  Harvie Heck, RN   Patient sent to lab for PT. Negative Harvie Heck, RN   PT negative. Samson Frederic and Depo 150mg  given per C. NP orders. Depo reminder card given and condoms.   Kizzie Ide, RN

## 2019-12-06 NOTE — Progress Notes (Signed)
S: Client here to change her birth control to Depo Provera.  See nurse's note. A/P: PT negative. Client is an ECP candidate-    ECP's-   Ella 30 mg x 1 dose Depo Provera 150 mg IM q 11-13 weeks x 1 year. Co to use condoms x 1 -2 weeks for back-up.

## 2020-05-20 ENCOUNTER — Ambulatory Visit: Payer: Self-pay

## 2020-09-16 ENCOUNTER — Ambulatory Visit (LOCAL_COMMUNITY_HEALTH_CENTER): Payer: Managed Care, Other (non HMO) | Admitting: Physician Assistant

## 2020-09-16 ENCOUNTER — Other Ambulatory Visit: Payer: Self-pay

## 2020-09-16 VITALS — BP 125/83 | Wt 207.4 lb

## 2020-09-16 DIAGNOSIS — N760 Acute vaginitis: Secondary | ICD-10-CM

## 2020-09-16 DIAGNOSIS — Z3009 Encounter for other general counseling and advice on contraception: Secondary | ICD-10-CM

## 2020-09-16 DIAGNOSIS — Z113 Encounter for screening for infections with a predominantly sexual mode of transmission: Secondary | ICD-10-CM

## 2020-09-16 DIAGNOSIS — Z3042 Encounter for surveillance of injectable contraceptive: Secondary | ICD-10-CM | POA: Diagnosis not present

## 2020-09-16 DIAGNOSIS — Z309 Encounter for contraceptive management, unspecified: Secondary | ICD-10-CM | POA: Diagnosis not present

## 2020-09-16 DIAGNOSIS — B9689 Other specified bacterial agents as the cause of diseases classified elsewhere: Secondary | ICD-10-CM

## 2020-09-16 LAB — WET PREP FOR TRICH, YEAST, CLUE
Trichomonas Exam: NEGATIVE
Yeast Exam: NEGATIVE

## 2020-09-16 MED ORDER — MEDROXYPROGESTERONE ACETATE 150 MG/ML IM SUSP
150.0000 mg | Freq: Once | INTRAMUSCULAR | Status: AC
  Administered 2020-09-16: 150 mg via INTRAMUSCULAR

## 2020-09-16 MED ORDER — METRONIDAZOLE 500 MG PO TABS: 500.0000 mg | ORAL_TABLET | Freq: Two times a day (BID) | ORAL | 0 refills | Status: AC

## 2020-09-16 NOTE — Progress Notes (Signed)
Patient is here for starting depo shot and STI check. Wet mount reviewed with provider. Providers orders completed.

## 2020-09-17 ENCOUNTER — Encounter: Payer: Self-pay | Admitting: Physician Assistant

## 2020-09-17 NOTE — Progress Notes (Signed)
[facility]  Subjective:  Kristen Grant is a 25 y.o. being seen today for a Depo restart and STD recheck.  Chief Complaint  Patient presents with   Contraception    Depo shot     HPI Patient states that she was treated for Chlamydia a few months ago and never RTC for a recheck.  Reports that she would like to have a recheck today and restart Depo.  Reports that she has had a "burning" sensation off and on in the vaginal area but no other symptoms.  Reports that her last Depo was in November and has not had another one since.  States that she took ECP 1.5 weeks ago.  LMP 09/07/2020 and last sex was 09/14/2020 without condoms.  Declines pregnancy test today.  Does the patient have a current or past history of drug use? No   No components found for: HCV]   Health Maintenance Due  Topic Date Due   COVID-19 Vaccine (1) Never done   Pneumococcal Vaccine 23-29 Years old (1 - PCV) Never done   HPV VACCINES (1 - 2-dose series) Never done   INFLUENZA VACCINE  09/02/2020   PAP SMEAR-Modifier  10/05/2020    Review of Systems  All other systems reviewed and are negative.  The following portions of the patient's history were reviewed and updated as appropriate: allergies, current medications, past family history, past medical history, past social history, past surgical history and problem list. Problem list updated.   See flowsheet for other program required questions.  Objective:   Vitals:   09/16/20 1356  BP: 125/83  Weight: 207 lb 6.4 oz (94.1 kg)    Physical Exam Constitutional:      General: She is not in acute distress.    Appearance: Normal appearance.  HENT:     Head: Normocephalic and atraumatic.     Mouth/Throat:     Mouth: Mucous membranes are moist.     Pharynx: Oropharynx is clear. No oropharyngeal exudate or posterior oropharyngeal erythema.  Eyes:     Conjunctiva/sclera: Conjunctivae normal.   Pulmonary:     Effort: Pulmonary effort is normal.  Abdominal:     Palpations: Abdomen is soft. There is no mass.     Tenderness: There is no abdominal tenderness. There is no guarding or rebound.  Genitourinary:    General: Normal vulva.     Rectum: Normal.     Comments: External genitalia/pubic area without nits, lice, edema, erythema, lesions and inguinal adenopathy. Vagina with normal mucosa and discharge. Cervix without visible lesions. Uterus firm, mobile, nt, no masses, no CMT, no adnexal tenderness or fullness.  Musculoskeletal:     Cervical back: Neck supple. No tenderness.  Skin:    General: Skin is warm and dry.     Findings: No bruising, erythema, lesion or rash.  Neurological:     Mental Status: She is alert and oriented to person, place, and time.  Psychiatric:        Mood and Affect: Mood normal.        Behavior: Behavior normal.        Thought Content: Thought content normal.        Judgment: Judgment normal.      Assessment and Plan:  Kristen Grant is a 25 y.o. female [facility] for a Women's Health problem visit  1. Encounter for counseling regarding contraception Reviewed with patient re:  BCM options. Reviewed with patient normal SE of Depo and when to call clinic with concerns. Reviewed with patient that she is eligible for ECP today due to unprotected sex within last 5 days. Patient declines ECP and pregnancy test today. Enc condoms with all sex for STD protection.   2. Screening for STD (sexually transmitted disease) Await test results.  Counseled that RN will call if needs to RTC for treatment once results are back.  - WET PREP FOR TRICH, YEAST, CLUE - Chlamydia/Gonorrhea [place] [facility] - HIV [place] [facility] - Syphilis Serology, [facility]  3. Surveillance for Depo-Provera contraception OK for Depo 150 mg IM today. Counseled patient to use condoms as back up for 2 weeks, check OTC pregnancy test in 2 weeks  and RTC if positive. - medroxyPROGESTERone (DEPO-PROVERA) injection 150 mg  4. BV (bacterial vaginosis) Treat BV with Metronidazole 500 mg #14 1 po BID for 7 days with food, no EtOH for 24 hr before and until 72 hr after completing medicine. No sex for 10 days. Enc to use OTC antifungal cream if has itching during or just after antibiotic use.  - metroNIDAZOLE (FLAGYL) 500 MG tablet; Take 1 tablet (500 mg total) by mouth 2 (two) times daily for 7 days.  Dispense: 14 tablet; Refill: 0     Return in about 11 weeks (around 12/02/2020) for RP and depo.  No future appointments.  Matt Holmes, PA

## 2020-10-11 ENCOUNTER — Encounter (HOSPITAL_COMMUNITY): Payer: Self-pay | Admitting: Neurology

## 2020-10-11 ENCOUNTER — Emergency Department (HOSPITAL_COMMUNITY): Payer: Managed Care, Other (non HMO) | Admitting: Certified Registered Nurse Anesthetist

## 2020-10-11 ENCOUNTER — Emergency Department (HOSPITAL_COMMUNITY): Payer: Managed Care, Other (non HMO)

## 2020-10-11 ENCOUNTER — Inpatient Hospital Stay (HOSPITAL_COMMUNITY)
Admission: EM | Admit: 2020-10-11 | Discharge: 2020-10-17 | DRG: 023 | Disposition: A | Discharge status: Rehabilitation Facility | Payer: Managed Care, Other (non HMO) | Attending: Neurology | Admitting: Neurology

## 2020-10-11 ENCOUNTER — Other Ambulatory Visit: Payer: Self-pay

## 2020-10-11 ENCOUNTER — Encounter (HOSPITAL_COMMUNITY)
Admission: EM | Disposition: A | Discharge status: Rehabilitation Facility | Payer: Self-pay | Source: Home / Self Care | Attending: Neurology

## 2020-10-11 ENCOUNTER — Inpatient Hospital Stay (HOSPITAL_COMMUNITY): Payer: Managed Care, Other (non HMO)

## 2020-10-11 DIAGNOSIS — G936 Cerebral edema: Secondary | ICD-10-CM | POA: Diagnosis present

## 2020-10-11 DIAGNOSIS — Q211 Atrial septal defect: Secondary | ICD-10-CM

## 2020-10-11 DIAGNOSIS — I6601 Occlusion and stenosis of right middle cerebral artery: Secondary | ICD-10-CM | POA: Diagnosis present

## 2020-10-11 DIAGNOSIS — H53462 Homonymous bilateral field defects, left side: Secondary | ICD-10-CM | POA: Diagnosis present

## 2020-10-11 DIAGNOSIS — I6602 Occlusion and stenosis of left middle cerebral artery: Secondary | ICD-10-CM | POA: Diagnosis present

## 2020-10-11 DIAGNOSIS — Z833 Family history of diabetes mellitus: Secondary | ICD-10-CM | POA: Diagnosis not present

## 2020-10-11 DIAGNOSIS — F419 Anxiety disorder, unspecified: Secondary | ICD-10-CM | POA: Diagnosis present

## 2020-10-11 DIAGNOSIS — Z793 Long term (current) use of hormonal contraceptives: Secondary | ICD-10-CM

## 2020-10-11 DIAGNOSIS — E785 Hyperlipidemia, unspecified: Secondary | ICD-10-CM | POA: Diagnosis present

## 2020-10-11 DIAGNOSIS — R29810 Facial weakness: Secondary | ICD-10-CM | POA: Diagnosis present

## 2020-10-11 DIAGNOSIS — R29714 NIHSS score 14: Secondary | ICD-10-CM | POA: Diagnosis present

## 2020-10-11 DIAGNOSIS — Z823 Family history of stroke: Secondary | ICD-10-CM

## 2020-10-11 DIAGNOSIS — R29716 NIHSS score 16: Secondary | ICD-10-CM | POA: Diagnosis not present

## 2020-10-11 DIAGNOSIS — R29707 NIHSS score 7: Secondary | ICD-10-CM | POA: Diagnosis not present

## 2020-10-11 DIAGNOSIS — F17290 Nicotine dependence, other tobacco product, uncomplicated: Secondary | ICD-10-CM | POA: Diagnosis present

## 2020-10-11 DIAGNOSIS — Z978 Presence of other specified devices: Secondary | ICD-10-CM

## 2020-10-11 DIAGNOSIS — Z8249 Family history of ischemic heart disease and other diseases of the circulatory system: Secondary | ICD-10-CM

## 2020-10-11 DIAGNOSIS — Z820 Family history of epilepsy and other diseases of the nervous system: Secondary | ICD-10-CM | POA: Diagnosis not present

## 2020-10-11 DIAGNOSIS — I63411 Cerebral infarction due to embolism of right middle cerebral artery: Secondary | ICD-10-CM | POA: Diagnosis present

## 2020-10-11 DIAGNOSIS — G8104 Flaccid hemiplegia affecting left nondominant side: Secondary | ICD-10-CM | POA: Diagnosis present

## 2020-10-11 DIAGNOSIS — Z20822 Contact with and (suspected) exposure to covid-19: Secondary | ICD-10-CM | POA: Diagnosis present

## 2020-10-11 DIAGNOSIS — F411 Generalized anxiety disorder: Secondary | ICD-10-CM | POA: Diagnosis not present

## 2020-10-11 DIAGNOSIS — I63511 Cerebral infarction due to unspecified occlusion or stenosis of right middle cerebral artery: Secondary | ICD-10-CM | POA: Diagnosis not present

## 2020-10-11 DIAGNOSIS — G8110 Spastic hemiplegia affecting unspecified side: Secondary | ICD-10-CM | POA: Diagnosis not present

## 2020-10-11 DIAGNOSIS — J45909 Unspecified asthma, uncomplicated: Secondary | ICD-10-CM | POA: Diagnosis present

## 2020-10-11 DIAGNOSIS — I69354 Hemiplegia and hemiparesis following cerebral infarction affecting left non-dominant side: Secondary | ICD-10-CM | POA: Diagnosis not present

## 2020-10-11 DIAGNOSIS — R471 Dysarthria and anarthria: Secondary | ICD-10-CM | POA: Diagnosis present

## 2020-10-11 DIAGNOSIS — R519 Headache, unspecified: Secondary | ICD-10-CM | POA: Diagnosis not present

## 2020-10-11 DIAGNOSIS — Z6830 Body mass index (BMI) 30.0-30.9, adult: Secondary | ICD-10-CM

## 2020-10-11 DIAGNOSIS — K219 Gastro-esophageal reflux disease without esophagitis: Secondary | ICD-10-CM | POA: Diagnosis present

## 2020-10-11 DIAGNOSIS — D649 Anemia, unspecified: Secondary | ICD-10-CM | POA: Diagnosis not present

## 2020-10-11 DIAGNOSIS — G8194 Hemiplegia, unspecified affecting left nondominant side: Secondary | ICD-10-CM | POA: Diagnosis not present

## 2020-10-11 DIAGNOSIS — I6389 Other cerebral infarction: Secondary | ICD-10-CM | POA: Diagnosis not present

## 2020-10-11 DIAGNOSIS — I639 Cerebral infarction, unspecified: Secondary | ICD-10-CM | POA: Diagnosis present

## 2020-10-11 DIAGNOSIS — D72829 Elevated white blood cell count, unspecified: Secondary | ICD-10-CM | POA: Diagnosis not present

## 2020-10-11 HISTORY — PX: IR PERCUTANEOUS ART THROMBECTOMY/INFUSION INTRACRANIAL INC DIAG ANGIO: IMG6087

## 2020-10-11 HISTORY — PX: IR CT HEAD LTD: IMG2386

## 2020-10-11 HISTORY — PX: IR ANGIO INTRA EXTRACRAN SEL COM CAROTID INNOMINATE UNI L MOD SED: IMG5358

## 2020-10-11 HISTORY — PX: IR ANGIO VERTEBRAL SEL VERTEBRAL UNI L MOD SED: IMG5367

## 2020-10-11 HISTORY — PX: RADIOLOGY WITH ANESTHESIA: SHX6223

## 2020-10-11 LAB — URINALYSIS, ROUTINE W REFLEX MICROSCOPIC
Bilirubin Urine: NEGATIVE
Glucose, UA: NEGATIVE mg/dL
Ketones, ur: NEGATIVE mg/dL
Leukocytes,Ua: NEGATIVE
Nitrite: NEGATIVE
Protein, ur: NEGATIVE mg/dL
Specific Gravity, Urine: 1.025 (ref 1.005–1.030)
pH: 6.5 (ref 5.0–8.0)

## 2020-10-11 LAB — I-STAT CHEM 8, ED
BUN: 9 mg/dL (ref 6–20)
Calcium, Ion: 1.13 mmol/L — ABNORMAL LOW (ref 1.15–1.40)
Chloride: 109 mmol/L (ref 98–111)
Creatinine, Ser: 0.5 mg/dL (ref 0.44–1.00)
Glucose, Bld: 100 mg/dL — ABNORMAL HIGH (ref 70–99)
HCT: 38 % (ref 36.0–46.0)
Hemoglobin: 12.9 g/dL (ref 12.0–15.0)
Potassium: 4.2 mmol/L (ref 3.5–5.1)
Sodium: 141 mmol/L (ref 135–145)
TCO2: 25 mmol/L (ref 22–32)

## 2020-10-11 LAB — URINALYSIS, MICROSCOPIC (REFLEX): Bacteria, UA: NONE SEEN

## 2020-10-11 LAB — DIFFERENTIAL
Abs Immature Granulocytes: 0.01 10*3/uL (ref 0.00–0.07)
Basophils Absolute: 0.1 10*3/uL (ref 0.0–0.1)
Basophils Relative: 2 %
Eosinophils Absolute: 0.2 10*3/uL (ref 0.0–0.5)
Eosinophils Relative: 3 %
Immature Granulocytes: 0 %
Lymphocytes Relative: 39 %
Lymphs Abs: 2.5 10*3/uL (ref 0.7–4.0)
Monocytes Absolute: 0.5 10*3/uL (ref 0.1–1.0)
Monocytes Relative: 8 %
Neutro Abs: 3 10*3/uL (ref 1.7–7.7)
Neutrophils Relative %: 48 %

## 2020-10-11 LAB — RESP PANEL BY RT-PCR (FLU A&B, COVID) ARPGX2
Influenza A by PCR: NEGATIVE
Influenza B by PCR: NEGATIVE
SARS Coronavirus 2 by RT PCR: NEGATIVE

## 2020-10-11 LAB — I-STAT BETA HCG BLOOD, ED (MC, WL, AP ONLY): I-stat hCG, quantitative: <5 m[IU]/mL (ref <5)

## 2020-10-11 LAB — RAPID URINE DRUG SCREEN, HOSP PERFORMED
Amphetamines: NOT DETECTED
Barbiturates: NOT DETECTED
Benzodiazepines: POSITIVE — AB
Cocaine: NOT DETECTED
Opiates: NOT DETECTED
Tetrahydrocannabinol: POSITIVE — AB

## 2020-10-11 LAB — COMPREHENSIVE METABOLIC PANEL
ALT: 13 U/L (ref 0–44)
AST: 20 U/L (ref 15–41)
Albumin: 3.8 g/dL (ref 3.5–5.0)
Alkaline Phosphatase: 44 U/L (ref 38–126)
Anion gap: 4 — ABNORMAL LOW (ref 5–15)
BUN: 7 mg/dL (ref 6–20)
CO2: 24 mmol/L (ref 22–32)
Calcium: 8.8 mg/dL — ABNORMAL LOW (ref 8.9–10.3)
Chloride: 110 mmol/L (ref 98–111)
Creatinine, Ser: 0.56 mg/dL (ref 0.44–1.00)
GFR, Estimated: >60 mL/min (ref >60)
Glucose, Bld: 101 mg/dL — ABNORMAL HIGH (ref 70–99)
Potassium: 4.2 mmol/L (ref 3.5–5.1)
Sodium: 138 mmol/L (ref 135–145)
Total Bilirubin: 0.8 mg/dL (ref 0.3–1.2)
Total Protein: 6 g/dL — ABNORMAL LOW (ref 6.5–8.1)

## 2020-10-11 LAB — PROTIME-INR
INR: 1 (ref 0.8–1.2)
Prothrombin Time: 13.2 seconds (ref 11.4–15.2)

## 2020-10-11 LAB — MRSA NEXT GEN BY PCR, NASAL: MRSA by PCR Next Gen: NOT DETECTED

## 2020-10-11 LAB — ETHANOL: Alcohol, Ethyl (B): <10 mg/dL (ref <10)

## 2020-10-11 LAB — CBC
HCT: 40.4 % (ref 36.0–46.0)
Hemoglobin: 13.5 g/dL (ref 12.0–15.0)
MCH: 29.7 pg (ref 26.0–34.0)
MCHC: 33.4 g/dL (ref 30.0–36.0)
MCV: 89 fL (ref 80.0–100.0)
Platelets: 283 10*3/uL (ref 150–400)
RBC: 4.54 MIL/uL (ref 3.87–5.11)
RDW: 13.5 % (ref 11.5–15.5)
WBC: 6.3 10*3/uL (ref 4.0–10.5)
nRBC: 0 % (ref 0.0–0.2)

## 2020-10-11 LAB — CBG MONITORING, ED: Glucose-Capillary: 106 mg/dL — ABNORMAL HIGH (ref 70–99)

## 2020-10-11 LAB — ANTITHROMBIN III: AntiThromb III Func: 109 % (ref 75–120)

## 2020-10-11 LAB — HIV ANTIBODY (ROUTINE TESTING W REFLEX): HIV Screen 4th Generation wRfx: NONREACTIVE

## 2020-10-11 LAB — ECHOCARDIOGRAM COMPLETE
Area-P 1/2: 4.49 cm2
S' Lateral: 2.9 cm
Weight: 3336.88 oz

## 2020-10-11 LAB — APTT: aPTT: 27 seconds (ref 24–36)

## 2020-10-11 SURGERY — IR WITH ANESTHESIA
Anesthesia: General

## 2020-10-11 MED ORDER — SODIUM CHLORIDE (PF) 0.9 % IJ SOLN
INTRAVENOUS | Status: DC | PRN
  Administered 2020-10-11: 25 ug via INTRA_ARTERIAL

## 2020-10-11 MED ORDER — CLEVIDIPINE BUTYRATE 0.5 MG/ML IV EMUL
0.0000 mg/h | INTRAVENOUS | Status: AC
  Filled 2020-10-11: qty 100

## 2020-10-11 MED ORDER — TIROFIBAN HCL IN NACL 5-0.9 MG/100ML-% IV SOLN
INTRAVENOUS | Status: AC
  Filled 2020-10-11: qty 100

## 2020-10-11 MED ORDER — FENTANYL CITRATE (PF) 100 MCG/2ML IJ SOLN
INTRAMUSCULAR | Status: AC
  Filled 2020-10-11: qty 2

## 2020-10-11 MED ORDER — CLEVIDIPINE BUTYRATE 0.5 MG/ML IV EMUL
0.0000 mg/h | INTRAVENOUS | Status: DC
Start: 2020-10-11 — End: 2020-10-11

## 2020-10-11 MED ORDER — PROMETHAZINE HCL 25 MG/ML IJ SOLN: 6.2500 mg | INTRAMUSCULAR | Status: DC | PRN

## 2020-10-11 MED ORDER — SENNOSIDES-DOCUSATE SODIUM 8.6-50 MG PO TABS: 1.0000 | ORAL_TABLET | Freq: Every evening | ORAL | Status: DC | PRN

## 2020-10-11 MED ORDER — OXYCODONE HCL 5 MG/5ML PO SOLN
5.0000 mg | Freq: Once | ORAL | Status: DC | PRN
Start: 2020-10-11 — End: 2020-10-11

## 2020-10-11 MED ORDER — SUGAMMADEX SODIUM 200 MG/2ML IV SOLN
INTRAVENOUS | Status: DC | PRN
  Administered 2020-10-11: 400 mg via INTRAVENOUS

## 2020-10-11 MED ORDER — ACETAMINOPHEN 325 MG PO TABS
650.0000 mg | ORAL_TABLET | ORAL | Status: DC | PRN
  Administered 2020-10-11 – 2020-10-12 (×2): 650 mg via ORAL
  Filled 2020-10-11 (×2): qty 2

## 2020-10-11 MED ORDER — IOHEXOL 350 MG/ML SOLN
150.0000 mL | Freq: Once | INTRAVENOUS | Status: AC | PRN
  Administered 2020-10-11: 60 mL via INTRA_ARTERIAL

## 2020-10-11 MED ORDER — MIDAZOLAM HCL 2 MG/2ML IJ SOLN
INTRAMUSCULAR | Status: AC
  Filled 2020-10-11: qty 2

## 2020-10-11 MED ORDER — ACETAMINOPHEN 160 MG/5ML PO SOLN: 650.0000 mg | ORAL | Status: DC | PRN

## 2020-10-11 MED ORDER — PROPOFOL 10 MG/ML IV BOLUS
INTRAVENOUS | Status: DC | PRN
  Administered 2020-10-11: 120 mg via INTRAVENOUS
  Administered 2020-10-11: 40 mg via INTRAVENOUS

## 2020-10-11 MED ORDER — SUCCINYLCHOLINE CHLORIDE 200 MG/10ML IV SOSY
PREFILLED_SYRINGE | INTRAVENOUS | Status: DC | PRN
  Administered 2020-10-11: 120 mg via INTRAVENOUS

## 2020-10-11 MED ORDER — AMISULPRIDE (ANTIEMETIC) 5 MG/2ML IV SOLN
INTRAVENOUS | Status: AC
  Filled 2020-10-11: qty 4

## 2020-10-11 MED ORDER — PANTOPRAZOLE SODIUM 40 MG IV SOLR
40.0000 mg | Freq: Every day | INTRAVENOUS | Status: DC
  Administered 2020-10-11: 40 mg via INTRAVENOUS
  Filled 2020-10-11: qty 40

## 2020-10-11 MED ORDER — ACETAMINOPHEN 325 MG PO TABS: 650.0000 mg | ORAL_TABLET | ORAL | Status: DC | PRN

## 2020-10-11 MED ORDER — CANGRELOR TETRASODIUM 50 MG IV SOLR
INTRAVENOUS | Status: AC
  Filled 2020-10-11: qty 50

## 2020-10-11 MED ORDER — IOHEXOL 350 MG/ML SOLN
100.0000 mL | Freq: Once | INTRAVENOUS | Status: AC | PRN
  Administered 2020-10-11: 100 mL via INTRAVENOUS

## 2020-10-11 MED ORDER — ONDANSETRON HCL 4 MG/2ML IJ SOLN
INTRAMUSCULAR | Status: DC | PRN
Start: 2020-10-11 — End: 2020-10-11
  Administered 2020-10-11: 4 mg via INTRAVENOUS

## 2020-10-11 MED ORDER — OXYCODONE HCL 5 MG PO TABS: 5.0000 mg | ORAL_TABLET | Freq: Once | ORAL | Status: DC | PRN

## 2020-10-11 MED ORDER — PHENYLEPHRINE HCL-NACL 20-0.9 MG/250ML-% IV SOLN
INTRAVENOUS | Status: DC | PRN
  Administered 2020-10-11: 30 ug/min via INTRAVENOUS

## 2020-10-11 MED ORDER — CLOPIDOGREL BISULFATE 300 MG PO TABS
ORAL_TABLET | ORAL | Status: AC
  Filled 2020-10-11: qty 1

## 2020-10-11 MED ORDER — AMISULPRIDE (ANTIEMETIC) 5 MG/2ML IV SOLN
10.0000 mg | Freq: Once | INTRAVENOUS | Status: AC | PRN
  Administered 2020-10-11: 10 mg via INTRAVENOUS
  Filled 2020-10-11: qty 4

## 2020-10-11 MED ORDER — ASPIRIN 81 MG PO CHEW
CHEWABLE_TABLET | ORAL | Status: AC
  Filled 2020-10-11: qty 1

## 2020-10-11 MED ORDER — FENTANYL CITRATE (PF) 100 MCG/2ML IJ SOLN
25.0000 ug | INTRAMUSCULAR | Status: DC | PRN
  Administered 2020-10-11: 50 ug via INTRAVENOUS
  Administered 2020-10-11 (×2): 25 ug via INTRAVENOUS

## 2020-10-11 MED ORDER — FENTANYL CITRATE (PF) 250 MCG/5ML IJ SOLN
INTRAMUSCULAR | Status: DC | PRN
  Administered 2020-10-11: 50 ug via INTRAVENOUS
  Administered 2020-10-11 (×2): 25 ug via INTRAVENOUS

## 2020-10-11 MED ORDER — SODIUM CHLORIDE 0.9 % IV SOLN: INTRAVENOUS | Status: DC | PRN

## 2020-10-11 MED ORDER — MIDAZOLAM HCL 5 MG/5ML IJ SOLN
INTRAMUSCULAR | Status: DC | PRN
Start: 2020-10-11 — End: 2020-10-11
  Administered 2020-10-11: 1 mg via INTRAVENOUS

## 2020-10-11 MED ORDER — VERAPAMIL HCL 2.5 MG/ML IV SOLN
INTRAVENOUS | Status: AC
  Filled 2020-10-11: qty 2

## 2020-10-11 MED ORDER — STROKE: EARLY STAGES OF RECOVERY BOOK: Freq: Once | Status: DC

## 2020-10-11 MED ORDER — TICAGRELOR 90 MG PO TABS
ORAL_TABLET | ORAL | Status: AC
  Filled 2020-10-11: qty 2

## 2020-10-11 MED ORDER — ROCURONIUM BROMIDE 10 MG/ML (PF) SYRINGE
PREFILLED_SYRINGE | INTRAVENOUS | Status: DC | PRN
  Administered 2020-10-11: 30 mg via INTRAVENOUS
  Administered 2020-10-11: 50 mg via INTRAVENOUS

## 2020-10-11 MED ORDER — TENECTEPLASE FOR STROKE
0.2500 mg/kg | PACK | Freq: Once | INTRAVENOUS | Status: AC
  Administered 2020-10-11: 24 mg via INTRAVENOUS
  Filled 2020-10-11: qty 4.8

## 2020-10-11 MED ORDER — DEXAMETHASONE SODIUM PHOSPHATE 10 MG/ML IJ SOLN
INTRAMUSCULAR | Status: DC | PRN
  Administered 2020-10-11: 10 mg via INTRAVENOUS

## 2020-10-11 MED ORDER — ACETAMINOPHEN 650 MG RE SUPP: 650.0000 mg | RECTAL | Status: DC | PRN

## 2020-10-11 MED ORDER — IOHEXOL 350 MG/ML SOLN
100.0000 mL | Freq: Once | INTRAVENOUS | Status: AC | PRN
  Administered 2020-10-11: 60 mL via INTRA_ARTERIAL

## 2020-10-11 MED ORDER — NITROGLYCERIN 1 MG/10 ML FOR IR/CATH LAB
INTRA_ARTERIAL | Status: AC
  Filled 2020-10-11: qty 10

## 2020-10-11 MED ORDER — LIDOCAINE HCL 1 % IJ SOLN
INTRAMUSCULAR | Status: AC
  Filled 2020-10-11: qty 20

## 2020-10-11 MED ORDER — SODIUM CHLORIDE 0.9 % IV SOLN: INTRAVENOUS | Status: DC

## 2020-10-11 MED ORDER — CEFAZOLIN SODIUM-DEXTROSE 2-3 GM-%(50ML) IV SOLR
INTRAVENOUS | Status: DC | PRN
  Administered 2020-10-11: 2 g via INTRAVENOUS

## 2020-10-11 MED ORDER — SODIUM CHLORIDE 0.9 % IV SOLN: INTRAVENOUS | Status: AC

## 2020-10-11 MED ORDER — CEFAZOLIN SODIUM-DEXTROSE 2-4 GM/100ML-% IV SOLN
INTRAVENOUS | Status: AC
  Filled 2020-10-11: qty 100

## 2020-10-11 MED ORDER — EPTIFIBATIDE 20 MG/10ML IV SOLN
INTRAVENOUS | Status: AC
  Filled 2020-10-11: qty 10

## 2020-10-11 MED ORDER — ACETAMINOPHEN 10 MG/ML IV SOLN
1000.0000 mg | Freq: Once | INTRAVENOUS | Status: DC | PRN
  Filled 2020-10-11: qty 100

## 2020-10-11 MED ORDER — CHLORHEXIDINE GLUCONATE CLOTH 2 % EX PADS
6.0000 | MEDICATED_PAD | Freq: Every day | CUTANEOUS | Status: DC
  Administered 2020-10-12 – 2020-10-16 (×5): 6 via TOPICAL

## 2020-10-11 NOTE — Anesthesia Preprocedure Evaluation (Signed)
Anesthesia Evaluation  Patient identified by MRN, date of birth, ID band Patient awake    Reviewed: Allergy & Precautions, NPO status , Patient's Chart, lab work & pertinent test results, Unable to perform ROS - Chart review onlyPreop documentation limited or incomplete due to emergent nature of procedure.  History of Anesthesia Complications Negative for: history of anesthetic complications  Airway Mallampati: II  TM Distance: >3 FB Neck ROM: Full    Dental  (+) Teeth Intact   Pulmonary asthma , Current Smoker,    breath sounds clear to auscultation       Cardiovascular negative cardio ROS   Rhythm:Regular     Neuro/Psych  Headaches, PSYCHIATRIC DISORDERS Anxiety CVA, Residual Symptoms    GI/Hepatic negative GI ROS, Neg liver ROS,   Endo/Other  negative endocrine ROS  Renal/GU negative Renal ROS  negative genitourinary   Musculoskeletal negative musculoskeletal ROS (+)   Abdominal   Peds  Hematology negative hematology ROS (+) Lab Results      Component                Value               Date                      WBC                      6.3                 10/11/2020                HGB                      12.9                10/11/2020                HCT                      38.0                10/11/2020                MCV                      89.0                10/11/2020                PLT                      283                 10/11/2020              Anesthesia Other Findings   Reproductive/Obstetrics OCPs  Lab Results      Component                Value               Date                      PREGTESTUR               Negative            12/06/2019  HCG                      <5.0                10/11/2020                                        Anesthesia Physical Anesthesia Plan  ASA: 2  Anesthesia Plan: General   Post-op Pain Management:    Induction:  Intravenous  PONV Risk Score and Plan: 2 and Ondansetron and Dexamethasone  Airway Management Planned: Oral ETT  Additional Equipment:   Intra-op Plan:   Post-operative Plan: Possible Post-op intubation/ventilation  Informed Consent: I have reviewed the patients History and Physical, chart, labs and discussed the procedure including the risks, benefits and alternatives for the proposed anesthesia with the patient or authorized representative who has indicated his/her understanding and acceptance.     Dental advisory given  Plan Discussed with: CRNA and Anesthesiologist  Anesthesia Plan Comments:         Anesthesia Quick Evaluation

## 2020-10-11 NOTE — Progress Notes (Signed)
  Echocardiogram 2D Echocardiogram has been performed.  Kristen Grant 10/11/2020, 5:54 PM

## 2020-10-11 NOTE — Anesthesia Procedure Notes (Signed)
Procedure Name: Intubation Date/Time: 10/11/2020 10:41 AM Performed by: Griffin Dakin, CRNA Pre-anesthesia Checklist: Patient identified, Emergency Drugs available, Suction available and Patient being monitored Patient Re-evaluated:Patient Re-evaluated prior to induction Oxygen Delivery Method: Circle system utilized Preoxygenation: Pre-oxygenation with 100% oxygen Induction Type: IV induction and Rapid sequence Laryngoscope Size: Mac and 4 Grade View: Grade I Tube type: Oral Tube size: 7.5 mm Number of attempts: 1 Airway Equipment and Method: Stylet and Oral airway Placement Confirmation: ETT inserted through vocal cords under direct vision, positive ETCO2 and breath sounds checked- equal and bilateral Secured at: 22 cm Tube secured with: Tape Dental Injury: Teeth and Oropharynx as per pre-operative assessment

## 2020-10-11 NOTE — Procedures (Signed)
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 Post CT brain No ICH .  RT groin hemostasis  with 28F angioseal closure device . Distal,pulses all intact . Extubated . Pupils 3 mm RT = LT sluggish. No facial asymmetry. Moving all 4s RT > Lt  Follows simple instructions appropriately. S.Wendall Isabell MD

## 2020-10-11 NOTE — ED Notes (Signed)
Pt in IR, Helle RN stroke nurse assists IR nurse to get pt to IR bay at this time, this RN defers care to IR RN.

## 2020-10-11 NOTE — H&P (Signed)
Admission H&P    Chief Complaint: Acute onset of left sided weakness, dysarthria and facial droop.   HPI: Kristen Grant is an 25 y.o. female who presents to the ED via EMS as a Code Stroke after her boyfriend noted her to suddenly become weak on her left side shortly after waking up this morning. LKN was 0815 after waking. She was still lying in bed when her partner noticed that the left side of her face was drooping, she was drooling and the left side of her body was weak. On EMS arrival, they noted same. CBG was 106. On arrival to the ED, she had slight right gaze deviation, left facial droop, dysarthria and inability to move/lift her LUE and LLE. She was able to slowly answer questions. She was complaining of a right sided headache.   STAT CT head revealed dense right M1 and M2 segments with equivocal early hypodensity in the right temporal lobe.    LSN: [id number] TNK Given: Yes NIHSS: 14 mRS: 0    Past Medical History:  Diagnosis Date   Anxiety    Asthma    no treatments required at this time   Migraine headache     Past Surgical History:  Procedure Laterality Date   NO PAST SURGERIES      Family History  Problem Relation Age of Onset   Hypertension Mother    Epilepsy Mother    Anxiety disorder Mother    Depression Mother    Migraines Mother    Diabetes Maternal Grandmother    Stroke Maternal Grandmother    Hypertension Maternal Grandmother    Breast cancer Neg Hx    Social History:  reports that she has been smoking cigars and e-cigarettes. She has never used smokeless tobacco. She reports current alcohol use of about 2.0 standard drinks per week. She reports current drug use. Frequency: 5.00 times per week. Drug: Marijuana.  Allergies: No Known Allergies  No current facility-administered medications on file prior to encounter.   Current Outpatient Medications on File Prior to Encounter  Medication Sig Dispense Refill   medroxyPROGESTERone (DEPO-PROVERA) 150 MG/ML  injection Inject 1 mL (150 mg total) into the muscle every 3 (three) months. 1 mL 0   norethindrone (MICRONOR) 0.35 MG tablet Take 1 tablet (0.35 mg total) by mouth daily for 28 days. 28 tablet 12     ROS: Complains of a right sided temporal headache. Detailed ROS deferred due to acuity of presentation.   Physical Examination: Blood pressure 121/78, pulse 80, weight 94.6 kg, SpO2 100 %.  HEENT-  [place]/AT Heart - RRR Lungs - Respirations unlabored Abdomen - Nondistended Extremities - No edema  Neurologic Examination: Mental Status: Awake and alert. Speech is slow and dysarthric but fluent. Comprehension intact. Fully oriented.  Cranial Nerves: II:  Visual fields intact when tested individually in each eye. There is extinction on the left to DSS. PERRL.  III,IV, VI: Left palpebral fissure is wider than on the right. EOMI but with nystagmus on rightward gaze. Also with saccadic pursuits.  V: Temp sensation intact bilaterally VII: Left facial droop. VIII: Hearing intact to voice IX,X: No hypophonia XI: Weak on the left.  XII: Tongue deviates to the left.  Motor: RUE and RLE 5/5 LUE 1/5 with trace movement LLE 2/5 Sensory: Can sense temp and light touch to BUE and BLE, but subjectively with mild hypoesthesia on the left in addition to dysesthesia. Positive for extinction on the left to DSS.  Deep Tendon Reflexes:  2+ bilateral brachioradialis, biceps, patellae and achilles Plantars: Right: downgoing   Left: downgoing Cerebellar: No ataxia on the right with FNF. Unable to perform on the left.  Gait: Deferred in the context of acuity of presentation  Results for orders placed or performed during the hospital encounter of 10/11/20 (from the past 48 hour(s))  CBG monitoring, ED     Status: Abnormal   Collection Time: 10/11/20  9:35 AM  Result Value Ref Range   Glucose-Capillary 106 (H) 70 - 99 mg/dL    Comment: Glucose reference range applies only to samples taken after fasting for at  least 8 hours.   Comment 1 Notify RN    Comment 2 Document in Chart   Ethanol     Status: None   Collection Time: 10/11/20  9:36 AM  Result Value Ref Range   Alcohol, Ethyl (B) <10 <10 mg/dL    Comment: (NOTE) Lowest detectable limit for serum alcohol is 10 mg/dL.  For medical purposes only. Performed at [facility], [address], [place], [place] [address]   Protime-INR     Status: None   Collection Time: 10/11/20  9:36 AM  Result Value Ref Range   Prothrombin Time 13.2 11.4 - 15.2 seconds   INR 1.0 0.8 - 1.2    Comment: (NOTE) INR goal varies based on device and disease states. Performed at [facility], [address] [place], [place] [address]   APTT     Status: None   Collection Time: 10/11/20  9:36 AM  Result Value Ref Range   aPTT 27 24 - 36 seconds    Comment: Performed at [facility], [address] [place], [place] [address]  CBC     Status: None   Collection Time: 10/11/20  9:36 AM  Result Value Ref Range   WBC 6.3 4.0 - 10.5 K/uL   RBC 4.54 3.87 - 5.11 MIL/uL   Hemoglobin 13.5 12.0 - 15.0 g/dL   HCT 41.6 60.6 - 30.1 %   MCV 89.0 80.0 - 100.0 fL   MCH 29.7 26.0 - 34.0 pg   MCHC 33.4 30.0 - 36.0 g/dL   RDW 60.1 09.3 - 23.5 %   Platelets 283 150 - 400 K/uL   nRBC 0.0 0.0 - 0.2 %    Comment: Performed at [facility], [address], [place], [place] [address]  Differential     Status: None   Collection Time: 10/11/20  9:36 AM  Result Value Ref Range   Neutrophils Relative % 48 %   Neutro Abs 3.0 1.7 - 7.7 K/uL   Lymphocytes Relative 39 %   Lymphs Abs 2.5 0.7 - 4.0 K/uL   Monocytes Relative 8 %   Monocytes Absolute 0.5 0.1 - 1.0 K/uL   Eosinophils Relative 3 %   Eosinophils Absolute 0.2 0.0 - 0.5 K/uL   Basophils Relative 2 %   Basophils Absolute 0.1 0.0 - 0.1 K/uL   Immature Granulocytes 0 %   Abs Immature Granulocytes 0.01 0.00 - 0.07 K/uL    Comment: Performed at [facility], [address] [place], [place]  [address]  Comprehensive metabolic panel     Status: Abnormal   Collection Time: 10/11/20  9:36 AM  Result Value Ref Range   Sodium 138 135 - 145 mmol/L   Potassium 4.2 3.5 - 5.1 mmol/L   Chloride 110 98 - 111 mmol/L   CO2 24 22 - 32 mmol/L  Glucose, Bld 101 (H) 70 - 99 mg/dL    Comment: Glucose reference range applies only to samples taken after fasting for at least 8 hours.   BUN 7 6 - 20 mg/dL   Creatinine, Ser 0.34 0.44 - 1.00 mg/dL   Calcium 8.8 (L) 8.9 - 10.3 mg/dL   Total Protein 6.0 (L) 6.5 - 8.1 g/dL   Albumin 3.8 3.5 - 5.0 g/dL   AST 20 15 - 41 U/L   ALT 13 0 - 44 U/L   Alkaline Phosphatase 44 38 - 126 U/L   Total Bilirubin 0.8 0.3 - 1.2 mg/dL   GFR, Estimated >91 >79 mL/min    Comment: (NOTE) Calculated using the CKD-EPI Creatinine Equation (2021)    Anion gap 4 (L) 5 - 15    Comment: Performed at [facility], [address] [place], [place] [address]  I-Stat beta hCG blood, ED     Status: None   Collection Time: 10/11/20  9:42 AM  Result Value Ref Range   I-stat hCG, quantitative <5.0 <5 mIU/mL   Comment 3            Comment:   GEST. AGE      CONC.  (mIU/mL)   <=1 WEEK        5 - 50     2 WEEKS       50 - 500     3 WEEKS       100 - 10,000     4 WEEKS     1,000 - 30,000        FEMALE AND NON-PREGNANT FEMALE:     LESS THAN 5 mIU/mL   I-stat chem 8, ED     Status: Abnormal   Collection Time: 10/11/20  9:43 AM  Result Value Ref Range   Sodium 141 135 - 145 mmol/L   Potassium 4.2 3.5 - 5.1 mmol/L   Chloride 109 98 - 111 mmol/L   BUN 9 6 - 20 mg/dL   Creatinine, Ser 9.79 0.44 - 1.00 mg/dL   Glucose, Bld 480 (H) 70 - 99 mg/dL    Comment: Glucose reference range applies only to samples taken after fasting for at least 8 hours.   Calcium, Ion 1.13 (L) 1.15 - 1.40 mmol/L   TCO2 25 22 - 32 mmol/L   Hemoglobin 12.9 12.0 - 15.0 g/dL   HCT 16.5 53.7 - 48.2 %   No results found.  Assessment: 25 y.o. female presenting with acute onset of left facial droop, left  sided weakness and dysarthria.  1. Exam reveals findings referable to the right MCA territory. Overall presentation is most consistent with acute ischemic stroke.  2. CT head: Equivocal hypodensity in right temporal region. Dense right M1 and M2 segments suggestive of thrombus.  3. CTA of head and neck: Right MCA occlusion. Right ICA is patent.  4. CTP:  11 cc core with 75 cc mismatch in the region of the right corona radiata. Total underperfused volume: 86 cc.  5. Stroke Risk Factors - estrogenic progestogen use and vaping (vaping carries with it a 71% increased risk of stroke relative to non-smokers).  6. After comprehensive review of possible contraindications, she has no absolute contraindications to TNK administration. Patient is a TNK candidate. Discussed extensively the risks/benefits of tPA treatment vs. no treatment with the patient and her mother, including risks of hemorrhage and death with tPA administration versus worse overall outcomes on average in patients within tPA time window  who are not administered TNK.  Overall benefits of TNK regarding long-term prognosis are felt to outweigh risks. The patient expressed understanding and wish to proceed with TNk.  7. The patient is a candidate for endovascular thrombectomy. Discussed extensively the risks/benefits of thrombectomy treatment vs. no treatment with the patient and her mother, including approximate 10% risk of SAH with thrombectomy, which carries significant chance for death or long morbidity, versus worse overall outcomes on average in patients within thrombectomy 24 hour time window who do not undergo the procedure. Overall benefits of thrombectomy regarding long-term prognosis are felt to outweigh risks. The patient expressed understanding and wish to proceed with 4-vessel angiogram and possible thrombectomy pending results of the angiogram. Consent form signed.  Plan: 1. Admitting to Neuro ICU.  2. Post-TNK order set to include  frequent neuro checks and BP management.  3. No antiplatelet medications or anticoagulants for at least 24 hours following tPA.  4. DVT prophylaxis with SCDs.  5. May need to be started on a statin, but suspect underlying etiology to be hypercoagulability in the setting of estrogenic progestogen use as well as vaping 6. TTE.  7. MRI brain 8. PT/OT/Speech.  9. NPO until passes swallow evaluation.  10. Telemetry monitoring 11. Fasting lipid panel, HgbA1c 12. The patient should discontinue her use of estrogenic progestogen 13. Hypercoagulable panel (ordered)  60 minutes spent in the emergent neurological evaluation and management of this critically ill patient with acute ischemic stroke.   Electronically signed: Dr. Caryl Pina 10/11/2020, 11:09 AM

## 2020-10-11 NOTE — Sedation Documentation (Signed)
Transported patient to PACU 4 with CRNA. Gave bedside report assessed right groin gauze and transparent dressing intact.

## 2020-10-11 NOTE — Plan of Care (Signed)

## 2020-10-11 NOTE — Transfer of Care (Signed)
Immediate Anesthesia Transfer of Care Note  Patient: Kristen Grant  Procedure(s) Performed: IR WITH ANESTHESIA  Patient Location: PACU  Anesthesia Type:General  Level of Consciousness: awake, alert  and oriented  Airway & Oxygen Therapy: Patient Spontanous Breathing  Post-op Assessment: Report given to RN, Post -op Vital signs reviewed and stable, Patient moving all extremities X 4 and Patient able to stick tongue midline  Post vital signs: Reviewed and stable  Last Vitals:  Vitals Value Taken Time  BP 118/71 10/11/20 1239  Temp    Pulse 83 10/11/20 1243  Resp 14 10/11/20 1243  SpO2 100 % 10/11/20 1243  Vitals shown include unvalidated device data.  Last Pain: There were no vitals filed for this visit.       Complications: No notable events documented.

## 2020-10-11 NOTE — Progress Notes (Signed)
Interventional Radiology Brief Note:  Patient assessed at bedside alongside Dr. Corliss Skains s/p mechanical thrombectomy this afternoon. Patient appears to be doing well.  She has been extubated and is conversant.  Complains of leg pain near the procedure site, however exam is reassuring.  No erythema or bruising.  Non-tender to palpation. No evidence of hematoma or pseudoaneurysm.  Motor and strength intact bilaterally.  Complaining of decreased sensation to the left dorsal forearm compared to right.  She asks several questions which are answered.   Continue with recovery tonight.   Keep leg straight.   NIR to follow.   Loyce Dys, MS RD PA-C

## 2020-10-11 NOTE — Code Documentation (Signed)
Stroke Response Nurse Documentation Code Documentation  Kristen Grant is a 25 y.o. female arriving to [facility] ED via [facility] on 10/11/20 with past medical hx of vap use and on oral contraceptives. Family history of stroke. Code stroke was activated by EMS.   Patient from home in bed where she was LKW at 0815 and now complaining of left side weakness and right side headache. Boyfriend home with patient. Patient states she was normal when she woke up.   Stroke team at the bedside on patient arrival. Labs drawn and patient cleared for CT by Dr. Posey Rea. Patient to CT with team. NIHSS 14, see documentation for details and code stroke times. Patient with left hemianopia, left facial droop, left arm weakness, left leg weakness, left decreased sensation, and visual/sensory neglect on exam. Nystagmus also noted.   The following imaging was completed:  CT, CTA head and neck. Patient is a candidate for IV Thrombolytic. Dr. Otelia Limes discussed treatment options with patient and her mother in CT. TNK given 1001. BP 118/64 prior to administration. Patient is a candidate for IR intervention. Taken to IR bay 8 to prepare while another case was completed. Patient had improving neuro exam. Plan to do angio and intervene if needed. It was decided not to intubate patient for IR. Taken to [facility] [facility]. Bedside handoff given to IR RN.   Care/Plan: Admit to ICU post procedure.    Ferman Hamming Stroke Response RN

## 2020-10-11 NOTE — Progress Notes (Signed)
PHARMACIST CODE STROKE RESPONSE  Notified to mix TNK at 0959 by Dr. Otelia Limes Delivered TNK to RN at 1000  TNK dose = 24 mg IV over 5 seconds.   Issues/delays encountered (if applicable): Consent from pt/family  Daylene Posey 10/11/20 10:08 AM

## 2020-10-11 NOTE — Anesthesia Procedure Notes (Signed)
Procedure Name: MAC Date/Time: 10/11/2020 10:30 AM Performed by: Griffin Dakin, CRNA Pre-anesthesia Checklist: Patient identified, Emergency Drugs available, Suction available, Patient being monitored and Timeout performed Patient Re-evaluated:Patient Re-evaluated prior to induction Oxygen Delivery Method: Simple face mask Induction Type: IV induction Placement Confirmation: positive ETCO2 and breath sounds checked- equal and bilateral Dental Injury: Teeth and Oropharynx as per pre-operative assessment

## 2020-10-11 NOTE — Progress Notes (Signed)
Patient ID: Kristen Grant, female   DOB: 11/06/1995, 25 y.o.   MRN: [id number] INR.   25 Y RT H F LSW .8 15 am MRS 0 New onset Lt sided weakness and Lt sided hemianopia ,decreased Lt  Sided sensation and Lt sided neglect. CT brain No ICH . ASPECTS 10.  CTA near complete occlusion of the RT MCA M1 distally.  CTP core of  11c with mismatch of 41ml . Endovascular treatment D/W sister and mother. Procedure,,reasons and alternatives discussed. Risks of ICH of 10 %,worsening neuro status ,death ,inability to revascularize reviewed. They expressed understanding and provided consent for treatment. S.Nikitta Sobiech MD

## 2020-10-11 NOTE — ED Notes (Signed)
Code stroke, pt arrives by EMS, slight R gaze and weak to LLE, LUE and L face, unable to move/lift leg and arm, L droop. Pt able to answer questions slowly. CBG 106. 18 L AC from EMS. Blood drawn and given to phlebotomy. Neurology at CT scan 1, to CT from medic bay at this time.

## 2020-10-11 NOTE — Sedation Documentation (Signed)
Right femoral Kristen Grant shealth removed and placed Kristen Grant angioseal

## 2020-10-11 NOTE — ED Provider Notes (Signed)
[facility] EMERGENCY DEPARTMENT Provider Note   CSN: [id number] Arrival date & time: 10/11/20  3474  An emergency department physician performed an initial assessment on this suspected stroke patient at 0934.  History Chief Complaint  Patient presents with   Code Stroke    Kristen Grant is a 25 y.o. female.  HPI Patient is 25 year old female with past medical history detailed below inclusive of migraine headaches asthma anxiety  Presented today with symptoms of acute stroke. Her deficits are left-sided weakness of upper and lower extremity seems that her symptoms started approximately 815.  She had left facial droop with EMS CBG was 106 she was brought to the ER  She denies any significant pain at this time.  Denies difficulty breathing.  No associated symptoms besides weakness no head injuries.     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   Nicotine vapor product user 10/06/2019   Migraines  05/09/2019   Smoker 1-2 Black & Milds/day 12/26/2018   LGSIL on Pap smear of cervix 08/18/2018   Encounter for surveillance of injectable contraceptive 08/18/2018   Generalized anxiety disorder with panic attacks 07/26/2017   ADD (attention deficit disorder) 07/07/2017   Obesity (BMI 30-39.9) 07/22/2015    Past Surgical History:  Procedure Laterality Date   NO PAST SURGERIES       OB History     Gravida  1   Para  1   Term  1   Preterm      AB      Living  1      SAB      IAB      Ectopic      Multiple  0   Live Births  1           Family History  Problem Relation Age of Onset   Hypertension Mother    Epilepsy Mother    Anxiety disorder Mother    Depression Mother     Migraines Mother    Diabetes Maternal Grandmother    Stroke Maternal Grandmother    Hypertension Maternal Grandmother    Breast cancer Neg Hx     Social History   Tobacco Use   Smoking status: Every Day    Types: Cigars, E-cigarettes   Smokeless tobacco: Never  Vaping Use   Vaping Use: Every day  Substance Use Topics   Alcohol use: Yes    Alcohol/week: 2.0 standard drinks    Types: 2 Glasses of wine per week    Comment: last use 10/05/19   Drug use: Yes    Frequency: 5.0 times per week    Types: Marijuana    Home Medications Prior to Admission medications   Medication Sig Start Date End Date Taking? Authorizing Provider  medroxyPROGESTERone (DEPO-PROVERA) 150 MG/ML injection Inject 1 mL (150 mg total) into the muscle every 3 (three) months. 12/06/19 02/21/20  Larene Pickett, FNP  norethindrone (MICRONOR) 0.35 MG tablet Take 1 tablet (0.35 mg total) by mouth daily for 28 days. 10/06/19 11/03/19  Alberteen Spindle, CNM    Allergies    Patient has no known allergies.  Review of Systems   Review of Systems  Unable to perform ROS: Acuity of condition   Physical Exam Updated Vital Signs BP 128/78 (BP Location: Right Arm)   Pulse 82   Temp 98.2 F (36.8 C)   Resp 17   Wt 94.6 kg   SpO2 99%   BMI 32.66 kg/m   Physical Exam Vitals and nursing note reviewed.  Constitutional:      General: She is not in acute distress.    Appearance: Normal appearance. She is not ill-appearing.  HENT:     Head: Normocephalic and atraumatic.  Eyes:     General: No scleral icterus.       Right eye: No discharge.        Left eye: No discharge.     Conjunctiva/sclera: Conjunctivae normal.  Pulmonary:     Effort: Pulmonary effort is normal.     Breath sounds: No stridor.  Neurological:     Mental Status: She is alert.     Comments: Neurologic exam deferred to neurology    ED Results / Procedures / Treatments   Labs (all labs ordered are listed, but only abnormal results are  displayed) Labs Reviewed  COMPREHENSIVE METABOLIC PANEL - Abnormal; Notable for the following components:      Result Value   Glucose, Bld 101 (*)    Calcium 8.8 (*)    Total Protein 6.0 (*)    Anion gap 4 (*)    All other components within normal limits  I-STAT CHEM 8, ED - Abnormal; Notable for the following components:   Glucose, Bld 100 (*)    Calcium, Ion 1.13 (*)    All other components within normal limits  CBG MONITORING, ED - Abnormal; Notable for the following components:   Glucose-Capillary 106 (*)    All other components within normal limits  RESP PANEL BY RT-PCR (FLU A&B, COVID) ARPGX2  ETHANOL  PROTIME-INR  APTT  CBC  DIFFERENTIAL  RAPID URINE DRUG SCREEN, HOSP PERFORMED  URINALYSIS, ROUTINE W REFLEX MICROSCOPIC  HIV ANTIBODY (ROUTINE TESTING W REFLEX)  ANTITHROMBIN III  PROTEIN C ACTIVITY  PROTEIN C, TOTAL  PROTEIN S ACTIVITY  PROTEIN S, TOTAL  LUPUS ANTICOAGULANT PANEL  BETA-2-GLYCOPROTEIN I ABS, IGG/M/A  HOMOCYSTEINE  FACTOR 5 LEIDEN  PROTHROMBIN GENE MUTATION  CARDIOLIPIN ANTIBODIES, IGG, IGM, IGA  I-STAT BETA HCG BLOOD, ED ([facility], [facility], [facility] ONLY)    EKG None  Radiology No results found.  Procedures .Critical Care Performed by: Gailen Shelter, PA Authorized by: Gailen Shelter, PA   Critical care provider statement:    Critical care time (minutes):  35   Critical care time was exclusive of:  Separately billable procedures and treating other patients and teaching time   Critical care was necessary to treat or prevent imminent or life-threatening deterioration of the following conditions: Stroke.   Critical care was time spent personally by me on the following activities:  Discussions with consultants, evaluation of patient's response to treatment, examination of patient, review of old charts, re-evaluation of patient's condition, pulse oximetry, ordering and review of radiographic studies, ordering and review of laboratory studies and ordering and  performing treatments and interventions   I assumed direction of critical care for this patient from another provider in my specialty: no     Medications Ordered in ED Medications  nitroGLYCERIN 100 mcg/mL intra-arterial injection (has no administration in time range)  ceFAZolin (ANCEF) 2-4 GM/100ML-% IVPB (has no administration in time range)  lidocaine (XYLOCAINE) 1 % (with pres) injection (has no administration in  time range)   stroke: mapping our early stages of recovery book (has no administration in time range)  0.9 %  sodium chloride infusion (has no administration in time range)  acetaminophen (TYLENOL) tablet 650 mg (has no administration in time range)    Or  acetaminophen (TYLENOL) 160 MG/5ML solution 650 mg (has no administration in time range)    Or  acetaminophen (TYLENOL) suppository 650 mg (has no administration in time range)  senna-docusate (Senokot-S) tablet 1 tablet (has no administration in time range)  pantoprazole (PROTONIX) injection 40 mg (has no administration in time range)  clevidipine (CLEVIPREX) infusion 0.5 mg/mL (has no administration in time range)  nitroGLYCERIN 25 mcg in sodium chloride (PF) 0.9 % 59.71 mL (25 mcg/mL) syringe (25 mcg Intra-arterial Given 10/11/20 1100)  nitroGLYCERIN 25 mcg in sodium chloride (PF) 0.9 % 59.71 mL (25 mcg/mL) syringe (25 mcg Intra-arterial Given 10/11/20 1113)  nitroGLYCERIN 25 mcg in sodium chloride (PF) 0.9 % 59.71 mL (25 mcg/mL) syringe (25 mcg Intra-arterial Given 10/11/20 1055)  nitroGLYCERIN 25 mcg in sodium chloride (PF) 0.9 % 59.71 mL (25 mcg/mL) syringe (25 mcg Intra-arterial Given 10/11/20 1125)  oxyCODONE (Oxy IR/ROXICODONE) immediate release tablet 5 mg (has no administration in time range)    Or  oxyCODONE (ROXICODONE) 5 MG/5ML solution 5 mg (has no administration in time range)  fentaNYL (SUBLIMAZE) injection 25-50 mcg (25 mcg Intravenous Given 10/11/20 1357)  promethazine (PHENERGAN) injection 6.25-12.5 mg (has no  administration in time range)  acetaminophen (OFIRMEV) IV 1,000 mg (has no administration in time range)  nitroGLYCERIN 25 mcg in sodium chloride (PF) 0.9 % 59.71 mL (25 mcg/mL) syringe (25 mcg Intra-arterial Given 10/11/20 1204)  amisulpride (BARHEMSYS) 5 MG/2ML injection (has no administration in time range)  fentaNYL (SUBLIMAZE) 100 MCG/2ML injection (has no administration in time range)  nitroGLYCERIN 25 mcg in sodium chloride (PF) 0.9 % 59.71 mL (25 mcg/mL) syringe (25 mcg Intra-arterial Given 10/11/20 1143)  iohexol (OMNIPAQUE) 350 MG/ML injection 100 mL (100 mLs Intravenous Contrast Given 10/11/20 0955)  tenecteplase (TNKASE) injection for Stroke 24 mg (24 mg Intravenous Given 10/11/20 1001)  fentaNYL (SUBLIMAZE) 100 MCG/2ML injection (  Override pull for Anesthesia 10/11/20 1029)  midazolam (VERSED) 2 MG/2ML injection (  Override pull for Anesthesia 10/11/20 1029)  iohexol (OMNIPAQUE) 350 MG/ML injection 150 mL (60 mLs Intra-arterial Contrast Given 10/11/20 1146)  iohexol (OMNIPAQUE) 350 MG/ML injection 100 mL (60 mLs Intra-arterial Contrast Given 10/11/20 1159)  amisulpride (BARHEMSYS) injection 10 mg (10 mg Intravenous Given 10/11/20 1253)    ED Course  I have reviewed the triage vital signs and the nursing notes.  Pertinent labs & imaging results that were available during my care of the patient were reviewed by me and considered in my medical decision making (see chart for details).  Clinical Course as of 10/11/20 1623  Fri Oct 11, 2020  2725 Assessed patient at CT scanner. She was brought in by EMS last known normal 8:15 AM.  Deficits include left-sided weakness and left visual field cut.  Symptoms came on suddenly, patient was with significant other when episode started.  Dr. Otelia Limes of neurology assessed patient in CT scanner as well.  CT noncontrast without any cranial hemorrhage.  Dense right MCA sign consistent with patient's deficits.  CT angio head and neck with perfusion study  obtained.  Patient and family and Dr. Otelia Limes are discussing TNKase administration.  Discussed with [facility] personally and authorize code IR per Dr. Otelia Limes. [WF]  [id number] Patient is protecting  her airway is speaking. [WF]    Clinical Course User Index [WF] Gailen ShelterFondaw, Jagger Demonte S, [place]    CMP unremarkable.  Coags, differential, ethanol negative, i-STAT Hg negative for pregnancy.  COVID influenza negative.    Patient admitted to Dr. Otelia LimesLindzen of neurology. TPA and thrombectomy.   MDM Rules/Calculators/A&P                          Final Clinical Impression(s) / ED Diagnoses Final diagnoses:  Cerebrovascular accident (CVA), unspecified mechanism [facility](HCC)    Rx / DC Orders ED Discharge Orders     None        Gailen ShelterFondaw, Abdalrahman Clementson S, [place] 10/11/20 1624    Glendora ScoreKommor, [place], MD 10/11/20 1642

## 2020-10-12 ENCOUNTER — Inpatient Hospital Stay (HOSPITAL_COMMUNITY): Payer: Managed Care, Other (non HMO)

## 2020-10-12 ENCOUNTER — Encounter (HOSPITAL_COMMUNITY)
Admission: EM | Disposition: A | Discharge status: Rehabilitation Facility | Payer: Self-pay | Source: Home / Self Care | Attending: Neurology

## 2020-10-12 ENCOUNTER — Inpatient Hospital Stay (HOSPITAL_COMMUNITY): Payer: Managed Care, Other (non HMO) | Admitting: Anesthesiology

## 2020-10-12 DIAGNOSIS — I639 Cerebral infarction, unspecified: Secondary | ICD-10-CM | POA: Diagnosis not present

## 2020-10-12 DIAGNOSIS — I6601 Occlusion and stenosis of right middle cerebral artery: Secondary | ICD-10-CM | POA: Diagnosis not present

## 2020-10-12 DIAGNOSIS — Z793 Long term (current) use of hormonal contraceptives: Secondary | ICD-10-CM | POA: Diagnosis not present

## 2020-10-12 HISTORY — PX: IR CT HEAD LTD: IMG2386

## 2020-10-12 HISTORY — PX: RADIOLOGY WITH ANESTHESIA: SHX6223

## 2020-10-12 HISTORY — PX: IR PERCUTANEOUS ART THROMBECTOMY/INFUSION INTRACRANIAL INC DIAG ANGIO: IMG6087

## 2020-10-12 LAB — CBC WITH DIFFERENTIAL/PLATELET
Abs Immature Granulocytes: 0.02 10*3/uL (ref 0.00–0.07)
Basophils Absolute: 0 10*3/uL (ref 0.0–0.1)
Basophils Relative: 0 %
Eosinophils Absolute: 0 10*3/uL (ref 0.0–0.5)
Eosinophils Relative: 0 %
HCT: 39.3 % (ref 36.0–46.0)
Hemoglobin: 13.2 g/dL (ref 12.0–15.0)
Immature Granulocytes: 0 %
Lymphocytes Relative: 12 %
Lymphs Abs: 1 10*3/uL (ref 0.7–4.0)
MCH: 29.2 pg (ref 26.0–34.0)
MCHC: 33.6 g/dL (ref 30.0–36.0)
MCV: 86.9 fL (ref 80.0–100.0)
Monocytes Absolute: 0.1 10*3/uL (ref 0.1–1.0)
Monocytes Relative: 1 %
Neutro Abs: 7.4 10*3/uL (ref 1.7–7.7)
Neutrophils Relative %: 87 %
Platelets: 304 10*3/uL (ref 150–400)
RBC: 4.52 MIL/uL (ref 3.87–5.11)
RDW: 13.7 % (ref 11.5–15.5)
WBC: 8.5 10*3/uL (ref 4.0–10.5)
nRBC: 0 % (ref 0.0–0.2)

## 2020-10-12 LAB — C-REACTIVE PROTEIN: CRP: 0.5 mg/dL (ref <1.0)

## 2020-10-12 LAB — BASIC METABOLIC PANEL
Anion gap: 11 (ref 5–15)
BUN: 6 mg/dL (ref 6–20)
CO2: 19 mmol/L — ABNORMAL LOW (ref 22–32)
Calcium: 8.9 mg/dL (ref 8.9–10.3)
Chloride: 107 mmol/L (ref 98–111)
Creatinine, Ser: 0.5 mg/dL (ref 0.44–1.00)
GFR, Estimated: >60 mL/min (ref >60)
Glucose, Bld: 156 mg/dL — ABNORMAL HIGH (ref 70–99)
Potassium: 3.5 mmol/L (ref 3.5–5.1)
Sodium: 137 mmol/L (ref 135–145)

## 2020-10-12 LAB — LIPID PANEL
Cholesterol: 171 mg/dL (ref 0–200)
HDL: 43 mg/dL (ref >40)
LDL Cholesterol: 117 mg/dL — ABNORMAL HIGH (ref 0–99)
Total CHOL/HDL Ratio: 4 RATIO
Triglycerides: 55 mg/dL (ref <150)
VLDL: 11 mg/dL (ref 0–40)

## 2020-10-12 LAB — HEMOGLOBIN A1C
Hgb A1c MFr Bld: 5 % (ref 4.8–5.6)
Mean Plasma Glucose: 96.8 mg/dL

## 2020-10-12 LAB — PREGNANCY, URINE: Preg Test, Ur: NEGATIVE

## 2020-10-12 LAB — GLUCOSE, CAPILLARY: Glucose-Capillary: 106 mg/dL — ABNORMAL HIGH (ref 70–99)

## 2020-10-12 SURGERY — RADIOLOGY WITH ANESTHESIA
Anesthesia: General

## 2020-10-12 MED ORDER — IOHEXOL 350 MG/ML SOLN
100.0000 mL | Freq: Once | INTRAVENOUS | Status: AC | PRN
  Administered 2020-10-12: 50 mL via INTRA_ARTERIAL

## 2020-10-12 MED ORDER — CEFAZOLIN SODIUM-DEXTROSE 2-4 GM/100ML-% IV SOLN
INTRAVENOUS | Status: AC
  Filled 2020-10-12: qty 100

## 2020-10-12 MED ORDER — DEXAMETHASONE SODIUM PHOSPHATE 10 MG/ML IJ SOLN
INTRAMUSCULAR | Status: DC | PRN
  Administered 2020-10-12: 10 mg via INTRAVENOUS

## 2020-10-12 MED ORDER — ACETAMINOPHEN 650 MG RE SUPP: 650.0000 mg | RECTAL | Status: DC | PRN

## 2020-10-12 MED ORDER — NITROGLYCERIN 1 MG/10 ML FOR IR/CATH LAB
INTRA_ARTERIAL | Status: DC | PRN
  Administered 2020-10-12: 25 ug

## 2020-10-12 MED ORDER — CANGRELOR TETRASODIUM 50 MG IV SOLR
INTRAVENOUS | Status: AC
  Filled 2020-10-12: qty 50

## 2020-10-12 MED ORDER — NITROGLYCERIN 1 MG/10 ML FOR IR/CATH LAB
INTRA_ARTERIAL | Status: AC
  Filled 2020-10-12: qty 10

## 2020-10-12 MED ORDER — VERAPAMIL HCL 2.5 MG/ML IV SOLN
INTRAVENOUS | Status: DC | PRN
  Administered 2020-10-12: 2.5 mg via INTRA_ARTERIAL

## 2020-10-12 MED ORDER — MORPHINE SULFATE (PF) 2 MG/ML IV SOLN
2.0000 mg | INTRAVENOUS | Status: DC | PRN
  Administered 2020-10-12 – 2020-10-13 (×3): 2 mg via INTRAVENOUS
  Filled 2020-10-12 (×4): qty 1

## 2020-10-12 MED ORDER — SODIUM CHLORIDE 0.9 % IV SOLN: INTRAVENOUS | Status: DC

## 2020-10-12 MED ORDER — ATROPINE SULFATE 1 MG/10ML IJ SOSY
PREFILLED_SYRINGE | INTRAMUSCULAR | Status: AC
  Filled 2020-10-12: qty 10

## 2020-10-12 MED ORDER — TICAGRELOR 90 MG PO TABS
ORAL_TABLET | ORAL | Status: AC
  Filled 2020-10-12: qty 2

## 2020-10-12 MED ORDER — CLEVIDIPINE BUTYRATE 0.5 MG/ML IV EMUL
INTRAVENOUS | Status: AC
  Filled 2020-10-12: qty 50

## 2020-10-12 MED ORDER — CLOPIDOGREL BISULFATE 300 MG PO TABS
ORAL_TABLET | ORAL | Status: AC
  Filled 2020-10-12: qty 1

## 2020-10-12 MED ORDER — ROCURONIUM BROMIDE 100 MG/10ML IV SOLN
INTRAVENOUS | Status: DC | PRN
  Administered 2020-10-12: 10 mg via INTRAVENOUS
  Administered 2020-10-12: 20 mg via INTRAVENOUS
  Administered 2020-10-12: 50 mg via INTRAVENOUS

## 2020-10-12 MED ORDER — SUCCINYLCHOLINE CHLORIDE 200 MG/10ML IV SOSY
PREFILLED_SYRINGE | INTRAVENOUS | Status: DC | PRN
  Administered 2020-10-12: 120 mg via INTRAVENOUS

## 2020-10-12 MED ORDER — ATORVASTATIN CALCIUM 80 MG PO TABS
80.0000 mg | ORAL_TABLET | Freq: Every day | ORAL | Status: DC
  Administered 2020-10-13 – 2020-10-17 (×5): 80 mg via ORAL
  Filled 2020-10-12 (×5): qty 1

## 2020-10-12 MED ORDER — LIDOCAINE HCL (CARDIAC) PF 100 MG/5ML IV SOSY
PREFILLED_SYRINGE | INTRAVENOUS | Status: DC | PRN
  Administered 2020-10-12: 50 mg via INTRAVENOUS

## 2020-10-12 MED ORDER — ATROPINE SULFATE 1 MG/10ML IJ SOSY
PREFILLED_SYRINGE | INTRAMUSCULAR | Status: AC
  Filled 2020-10-12: qty 20

## 2020-10-12 MED ORDER — ALBUTEROL SULFATE (2.5 MG/3ML) 0.083% IN NEBU: 2.5000 mg | INHALATION_SOLUTION | RESPIRATORY_TRACT | Status: DC | PRN

## 2020-10-12 MED ORDER — ACETAMINOPHEN 325 MG PO TABS
650.0000 mg | ORAL_TABLET | ORAL | Status: DC | PRN
  Administered 2020-10-15 – 2020-10-16 (×3): 650 mg via ORAL
  Filled 2020-10-12 (×3): qty 2

## 2020-10-12 MED ORDER — NOREPINEPHRINE 4 MG/250ML-% IV SOLN
2.0000 ug/min | INTRAVENOUS | Status: DC
  Administered 2020-10-12: 2 ug/min via INTRAVENOUS
  Filled 2020-10-12: qty 250

## 2020-10-12 MED ORDER — CEFAZOLIN SODIUM-DEXTROSE 2-3 GM-%(50ML) IV SOLR
INTRAVENOUS | Status: DC | PRN
  Administered 2020-10-12: 2 g via INTRAVENOUS

## 2020-10-12 MED ORDER — PROPOFOL 10 MG/ML IV BOLUS
INTRAVENOUS | Status: DC | PRN
  Administered 2020-10-12: 120 mg via INTRAVENOUS
  Administered 2020-10-12: 50 mg via INTRAVENOUS

## 2020-10-12 MED ORDER — FENTANYL CITRATE (PF) 100 MCG/2ML IJ SOLN: 50.0000 ug | INTRAMUSCULAR | Status: DC | PRN

## 2020-10-12 MED ORDER — SUGAMMADEX SODIUM 200 MG/2ML IV SOLN
INTRAVENOUS | Status: DC | PRN
  Administered 2020-10-12: 200 mg via INTRAVENOUS

## 2020-10-12 MED ORDER — CLEVIDIPINE BUTYRATE 0.5 MG/ML IV EMUL
INTRAVENOUS | Status: DC | PRN
  Administered 2020-10-12: 2 mg/h via INTRAVENOUS

## 2020-10-12 MED ORDER — ONDANSETRON HCL 4 MG/2ML IJ SOLN
INTRAMUSCULAR | Status: DC | PRN
  Administered 2020-10-12: 4 mg via INTRAVENOUS

## 2020-10-12 MED ORDER — FENTANYL CITRATE (PF) 100 MCG/2ML IJ SOLN
INTRAMUSCULAR | Status: DC | PRN
  Administered 2020-10-12 (×2): 50 ug via INTRAVENOUS

## 2020-10-12 MED ORDER — TIROFIBAN HCL IN NACL 5-0.9 MG/100ML-% IV SOLN
INTRAVENOUS | Status: AC
  Filled 2020-10-12: qty 100

## 2020-10-12 MED ORDER — ACETAMINOPHEN 650 MG RE SUPP: 650.0000 mg | Freq: Four times a day (QID) | RECTAL | Status: DC | PRN

## 2020-10-12 MED ORDER — IOHEXOL 350 MG/ML SOLN
100.0000 mL | Freq: Once | INTRAVENOUS | Status: AC | PRN
  Administered 2020-10-12: 100 mL via INTRAVENOUS

## 2020-10-12 MED ORDER — LABETALOL HCL 5 MG/ML IV SOLN
INTRAVENOUS | Status: DC | PRN
  Administered 2020-10-12: 5 mg via INTRAVENOUS

## 2020-10-12 MED ORDER — FENTANYL CITRATE (PF) 100 MCG/2ML IJ SOLN
INTRAMUSCULAR | Status: AC
  Filled 2020-10-12: qty 2

## 2020-10-12 MED ORDER — PANTOPRAZOLE SODIUM 40 MG IV SOLR
40.0000 mg | Freq: Every day | INTRAVENOUS | Status: DC
  Administered 2020-10-12 – 2020-10-17 (×6): 40 mg via INTRAVENOUS
  Filled 2020-10-12 (×6): qty 40

## 2020-10-12 MED ORDER — LACTATED RINGERS IV SOLN: INTRAVENOUS | Status: DC | PRN

## 2020-10-12 MED ORDER — DOCUSATE SODIUM 50 MG/5ML PO LIQD: 100.0000 mg | Freq: Two times a day (BID) | ORAL | Status: DC

## 2020-10-12 MED ORDER — CLEVIDIPINE BUTYRATE 0.5 MG/ML IV EMUL: 0.0000 mg/h | INTRAVENOUS | Status: DC

## 2020-10-12 MED ORDER — VERAPAMIL HCL 2.5 MG/ML IV SOLN
INTRAVENOUS | Status: AC
  Filled 2020-10-12: qty 2

## 2020-10-12 MED ORDER — PHENYLEPHRINE HCL-NACL 20-0.9 MG/250ML-% IV SOLN
INTRAVENOUS | Status: DC | PRN
  Administered 2020-10-12: 50 ug/min via INTRAVENOUS

## 2020-10-12 MED ORDER — EPHEDRINE SULFATE 50 MG/ML IJ SOLN
INTRAMUSCULAR | Status: DC | PRN
  Administered 2020-10-12: 5 mg via INTRAVENOUS

## 2020-10-12 MED ORDER — ESMOLOL HCL 100 MG/10ML IV SOLN
INTRAVENOUS | Status: DC | PRN
  Administered 2020-10-12: 20 mg via INTRAVENOUS
  Administered 2020-10-12: 30 mg via INTRAVENOUS
  Administered 2020-10-12 (×2): 20 mg via INTRAVENOUS

## 2020-10-12 MED ORDER — SODIUM CHLORIDE 0.9 % IV SOLN: 250.0000 mL | INTRAVENOUS | Status: DC

## 2020-10-12 MED ORDER — EPTIFIBATIDE 20 MG/10ML IV SOLN
INTRAVENOUS | Status: AC
  Filled 2020-10-12: qty 10

## 2020-10-12 MED ORDER — CLEVIDIPINE BUTYRATE 0.5 MG/ML IV EMUL
0.0000 mg/h | INTRAVENOUS | Status: DC
  Filled 2020-10-12: qty 50

## 2020-10-12 MED ORDER — ACETAMINOPHEN 160 MG/5ML PO SOLN
650.0000 mg | ORAL | Status: DC | PRN
  Administered 2020-10-14: 650 mg
  Filled 2020-10-12 (×2): qty 20.3

## 2020-10-12 MED ORDER — ASPIRIN 81 MG PO CHEW
CHEWABLE_TABLET | ORAL | Status: AC
  Filled 2020-10-12: qty 1

## 2020-10-12 MED ORDER — POLYETHYLENE GLYCOL 3350 17 G PO PACK: 17.0000 g | PACK | Freq: Every day | ORAL | Status: DC

## 2020-10-12 MED ORDER — ONDANSETRON HCL 4 MG/2ML IJ SOLN
4.0000 mg | Freq: Four times a day (QID) | INTRAMUSCULAR | Status: DC | PRN
  Administered 2020-10-12 – 2020-10-17 (×4): 4 mg via INTRAVENOUS
  Filled 2020-10-12 (×4): qty 2

## 2020-10-12 MED ORDER — CLEVIDIPINE BUTYRATE 0.5 MG/ML IV EMUL
0.0000 mg/h | INTRAVENOUS | Status: DC
Start: 2020-10-12 — End: 2020-10-12
  Administered 2020-10-12: 1 mg/h via INTRAVENOUS
  Filled 2020-10-12: qty 50

## 2020-10-12 MED ORDER — LIDOCAINE HCL (CARDIAC) PF 100 MG/5ML IV SOSY: PREFILLED_SYRINGE | INTRAVENOUS | Status: DC | PRN

## 2020-10-12 MED ORDER — NOREPINEPHRINE 4 MG/250ML-% IV SOLN
INTRAVENOUS | Status: AC
  Filled 2020-10-12: qty 250

## 2020-10-12 MED ORDER — IOHEXOL 350 MG/ML SOLN
100.0000 mL | Freq: Once | INTRAVENOUS | Status: AC | PRN
  Administered 2020-10-12: 100 mL via INTRA_ARTERIAL

## 2020-10-12 NOTE — Progress Notes (Addendum)
Neurology Consultation Reason for Consult: left hemiplegia. Code stroke    CC: left hemiplegia  HPI: Kristen Grant is a 25 y.o. female known to our service for admission on 9/9. She presented as a Code Stroke after her boyfriend noted her to suddenly become weak on her left side shortly after waking up that morning.Marland Kitchen LKN was 0815 after waking. She was still lying in bed when her partner noticed that the left side of her face was drooping, she was drooling and the left side of her body was weak. On EMS arrival, they noted same. CBG was 106. On arrival to the ED, she had slight right gaze deviation, left facial droop, dysarthria and inability to move/lift her LUE and LLE. She was able to slowly answer questions. She was complaining of a right sided headache.    STAT CT head revealed dense right M1 and M2 segments with equivocal early hypodensity in the right temporal lobe.    She was taken to IR suite and had an angiogram showing occluded Right MCA m1 segment; she had TICI 3 revascularization, and post CT brain showed no ICH.  She was seen earlier today by this examiner; please refer to progress note earlier today. At that time she had 4/5 strength at left side, sensory was intact throughout. And her only complaint was right groin pain from puncture yesterday.   At 3:05 this afternoon, we received a message from nurse that patient was once again plegic at left side. Code stroke was activated by nursing staff. We met patient at CT. On approach she had left hemiplegia, right gaze preference, and slurred speech, and sensory deficits on left side. She was also tachycardic into the 130s, diaphoretic, and complained of being hot.   Repeat CTH showed large right mca territory stroke, including the right frontal lobe, right insula, internal capsule, right lentiform nucleus, caudate, and inferior right frontal cortex/operculum, aspects 4.  CTA showed proximal right M2 occlusion, and no stenosis in the neck.    CT perfusion showed a large area of core infarct in the right MCA territory measuring 35m with small area of ischemic penumbra.   Patient was taken immediately to neuroIR for intervention after consent obtained from patient's mother.      LKW: 1500 tPA given?: No,  IA performed?:  yes, groin puncture time:  4:30 Premorbid modified rankin scale: 0     0 - No symptoms.     1 - No significant disability. Able to carry out all usual activities, despite some symptoms.     2 - Slight disability. Able to look after own affairs without assistance, but unable to carry out all previous activities.     3 - Moderate disability. Requires some help, but able to walk unassisted.     4 - Moderately severe disability. Unable to attend to own bodily needs without assistance, and unable to walk unassisted.     5 - Severe disability. Requires constant nursing care and attention, bedridden, incontinent.     6 - Dead.      Past Medical History:  Diagnosis Date   Anxiety    Asthma    no treatments required at this time   Migraine headache       Family History  Problem Relation Age of Onset   Hypertension Mother    Epilepsy Mother    Anxiety disorder Mother    Depression Mother    Migraines Mother    Diabetes Maternal Grandmother  Stroke Maternal Grandmother    Hypertension Maternal Grandmother    Breast cancer Neg Hx       Social History:  reports that she has been smoking e-cigarettes. She started smoking about 20 months ago. She has never used smokeless tobacco. She reports that she does not currently use alcohol. She reports current drug use. Frequency: 3.00 times per week. Drug: Marijuana.    Exam: Current vital signs: BP 103/67 (BP Location: Right Arm)   Pulse 78 Comment: 78  Temp 98.3 F (36.8 C) (Oral)   Resp 11   Ht '5\' 9"'  (1.753 m)   Wt 94.6 kg   SpO2 97%   BMI 30.80 kg/m  Vital signs in last 24 hours: Temp:  [97.7 F (36.5 C)-98.5 F (36.9 C)] 98.3 F (36.8 C)  (09/10 1225) Pulse Rate:  [74-84] 78 (09/10 1300) Resp:  [10-23] 11 (09/10 1300) BP: (86-122)/(46-81) 103/67 (09/10 1300) SpO2:  [96 %-100 %] 97 % (09/10 1300)   Physical Exam  Constitutional: Appears well-developed and well-nourished.  Psych: Affect appropriate to situation,   Eyes: No scleral injection HENT: No oropharyngeal obstruction.  MSK: no joint deformities.  Cardiovascular: Normal rate and regular rhythm.  Respiratory: Effort normal, non-labored breathing GI: Soft.  No distension. There is no tenderness.  Skin: Warm dry and intact visible skin  Neuro: Mental Status: Patient is awake, alert, oriented to person, place, month, year, and situation. Speech is markedly slurred compared to earlier today.     Cranial Nerves: II: Visual Fields are full. Pupils are equal, round, and reactive to light.  Has right gaze preference that worsened over time as exam continued. Initially she was able to go past midline but slowly had fixed right gaze  III,IV, VI: EOMI without ptosis or diploplia.  V: Facial sensation is decreased at left side. VII: Facial movement is asymmetric with left face droop .  VIII: hearing is intact to voice X: Unassessed XI: Shoulder shrug is markedly asymmetric. No shoulder shrug at left. XII: tongue is midline without atrophy or fasciculations.  Motor: Tone is normal. Bulk is normal. 5/5 strength at right side. Patient has complete left hemiplegia Sensory: Sensation is symmetric to light touch and temperature in the right sided arm and leg; sensory deficit noted to left hemibody    Plantars: Toes are downgoing bilaterally.   Cerebellar: Not able to be assessed. Pt is plegic at left side. And it was code stroke   NIHSS 16    Impression: 25 year old white female with recurrent left hemiplegia, post mechanical thrombectomy of right M1 on 10/11/2020 now with proximal Rt M2 occlusion.   Recommendations: - to neuroIR with mechanical thrombectomy and  possible stenting of reoccluded segments. Continue with current ICU protocol post thrombectomy.    The patient was seen in follow-up throughout the stroke code.  She remained severely dysarthric with plegia on the left side although she did not follow commands.

## 2020-10-12 NOTE — Transfer of Care (Signed)
Immediate Anesthesia Transfer of Care Note  Patient: Daysi Chales Abrahams  Procedure(s) Performed: RADIOLOGY WITH ANESTHESIA  Patient Location: ICU  Anesthesia Type:General  Level of Consciousness: awake, alert  and responds to stimulation  Airway & Oxygen Therapy: Patient Spontanous Breathing and Patient connected to face mask oxygen  Post-op Assessment: Report given to RN, Post -op Vital signs reviewed and stable and Patient moving all extremities. Left side weaker.  Post vital signs: Reviewed and stable  Last Vitals:  Vitals Value Taken Time  BP 113/55 10/12/20 1932  Temp    Pulse    Resp 13 10/12/20 1942  SpO2 100 % 10/12/20 1942  Vitals shown include unvalidated device data.  Last Pain:  Vitals:   10/12/20 1615  TempSrc: Oral  PainSc:       Patients Stated Pain Goal: 0 (10/12/20 1300)  Complications: No notable events documented.

## 2020-10-12 NOTE — Progress Notes (Signed)
Patient transferred to [facility] Room 24 by CRNA, Anesthesiologist, and RN. Bedside report given to receiving nurse. Right groin site clean,dry, intact. Distal pulses palpable.

## 2020-10-12 NOTE — Progress Notes (Signed)
Received back fromIR, s/p thrombectomy, R groin approach, stable site. SBAR from [employer] and Radio broadcast assistant.   Pt drowsy but follows commands    + slight L facial droop    MAE, slight LUE and LLE weakness  Plan for tight BP control, on cleviprex with goal SBP 120-140, weaning gtt.   Dr Armandina Gemma, neurology and Dr Ardeth Perfect, CCM at bedside.

## 2020-10-12 NOTE — Evaluation (Signed)
Physical Therapy Evaluation Patient Details Name: Kristen Grant MRN: [id number] DOB: [birth year] Today's Date: 10/12/2020   History of Present Illness  Tht pt is a 25 yo female presenting 9/9 with c/o L sided weakness and facial droop. Pt s/p L MCA mechanical thrombectomy with revascularization on 9/9. PMH includes: anxiety and migraines.   Clinical Impression  Pt in bed upon arrival of PT, agreeable to evaluation at this time. Prior to admission the pt was completely independent without need for AD, working two jobs, living at home with her partner and 26 yo son. The pt now presents with minor limitations in functional mobility, stability, and activity tolerance due to above dx, and will continue to benefit from skilled PT to address these deficits. The pt was able to demo good mobility without physical assist to get out of bed, but benefits from minA to minG due to pt anxiety with OOB movement at this time. She was able to complete short bout of hallway ambulation with minA to minG and no AD at this time before arrival of transport to MRI. Will benefit from follow up visit of therapy to further assess dynamic balance and stair training prior to return home to ensure safety and fully assess for need of follow-up therapies after d/c.      Follow Up Recommendations Outpatient PT;Supervision for mobility/OOB (OP neuro vs no PT pending progression)    Equipment Recommendations  None recommended by PT    Recommendations for Other Services       Precautions / Restrictions Precautions Precautions: Fall Precaution Comments: low fall Restrictions Weight Bearing Restrictions: No      Mobility  Bed Mobility Overal bed mobility: Modified Independent             General bed mobility comments: increased time and effort, HOB elevated for pt comfort due to anxiety, no assist needed    Transfers Overall transfer level: Needs assistance Equipment used: 1 person hand held assist Transfers:  Sit to/from Stand Sit to Stand: Min assist;Min guard         General transfer comment: progressed from minA of 1 to minG. pt with slow but steady movements  Ambulation/Gait Ambulation/Gait assistance: Min assist;Min guard Gait Distance (Feet): 45 Feet Assistive device: 1 person hand held assist;None Gait Pattern/deviations: Step-through pattern;Decreased stride length;Trunk flexed Gait velocity: decreased   General Gait Details: slow but steady for pt comfort, progressed from minA HHA to minG. slight trunk flexion due to pain from R groin site     Modified Rankin (Stroke Patients Only) Modified Rankin (Stroke Patients Only) Pre-Morbid Rankin Score: No symptoms Modified Rankin: Moderately severe disability     Balance Overall balance assessment: Mild deficits observed, not formally tested                                           Pertinent Vitals/Pain Pain Assessment: Faces Faces Pain Scale: Hurts little more Pain Location: back (stiff/sore), chest (sore), R groin (incision site) Pain Descriptors / Indicators: Discomfort;Grimacing Pain Intervention(s): Limited activity within patient's tolerance;Monitored during session;Repositioned    Home Living Family/patient expects to be discharged to:: Private residence Living Arrangements: Children;Spouse/significant other Available Help at Discharge: Family;Available 24 hours/day Type of Home: House Home Access: Stairs to enter Entrance Stairs-Rails: None Entrance Stairs-Number of Steps: 3 Home Layout: One level Home Equipment: None      Prior Function Level of  Independence: Independent         Comments: 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 Needs to be on her feet all day at either place. 5 yo son who just started school     Hand Dominance        Extremity/Trunk Assessment   Upper Extremity Assessment Upper Extremity Assessment: Defer to OT  evaluation    Lower Extremity Assessment Lower Extremity Assessment: RLE deficits/detail RLE Deficits / Details: MMT limited by pain at R groin site RLE Sensation: WNL RLE Coordination: WNL    Cervical / Trunk Assessment Cervical / Trunk Assessment: Normal  Communication   Communication: No difficulties  Cognition Arousal/Alertness: Awake/alert Behavior During Therapy: Anxious;WFL for tasks assessed/performed Overall Cognitive Status: Within Functional Limits for tasks assessed                                 General Comments: pt generally anxious, but agreeable to education and encouragement, able to follow all instructions well      General Comments General comments (skin integrity, edema, etc.): VSS on RA    Exercises     Assessment/Plan    PT Assessment Patient needs continued PT services  PT Problem List Decreased activity tolerance;Decreased balance;Decreased mobility       PT Treatment Interventions DME instruction;Gait training;Stair training;Functional mobility training;Therapeutic activities;Therapeutic exercise;Neuromuscular re-education;Patient/family education    PT Goals (Current goals can be found in the Care Plan section)  Acute Rehab PT Goals Patient Stated Goal: return home PT Goal Formulation: With patient Time For Goal Achievement: 10/26/20 Potential to Achieve Goals: Good    Frequency Min 3X/week    AM-PAC PT "6 Clicks" Mobility  Outcome Measure Help needed turning from your back to your side while in a flat bed without using bedrails?: None Help needed moving from lying on your back to sitting on the side of a flat bed without using bedrails?: A Little Help needed moving to and from a bed to a chair (including a wheelchair)?: A Little Help needed standing up from a chair using your arms (e.g., wheelchair or bedside chair)?: A Little Help needed to walk in hospital room?: A Little Help needed climbing 3-5 steps with a railing? :  A Little 6 Click Score: 19    End of Session Equipment Utilized During Treatment: Gait belt Activity Tolerance: Patient tolerated treatment well Patient left: in chair (transport chair to MRI) Nurse Communication: Mobility status (pt anxiety) PT Visit Diagnosis: Other abnormalities of gait and mobility (R26.89)    Time: 3716-9678 PT Time Calculation (min) (ACUTE ONLY): 31 min   Charges:   PT Evaluation $PT Eval Low Complexity: 1 Low PT Treatments $Gait Training: 8-22 mins        Vickki Muff, PT, DPT   Acute Rehabilitation Department Pager #: [phone]  Ronnie Derby 10/12/2020, 11:24 AM

## 2020-10-12 NOTE — Progress Notes (Signed)
Referring Physician(s): Dr Agnes Lawrence  Supervising Physician: Julieanne Cotton  Patient Status:  [facility] - In-pt  Chief Complaint:  L MCA occlusion  Subjective:  IR procedure 10/11/20 S/P bilateral common carotid and Lt VA angiograms followed by complete revascularization of occluded LT MCA M1 seg with x pass with contact aspiration and x1 pass with solitaire 61mm x 40 mm retriever and contact aspiration achieving a TICI 3 revascularization. Post CT brain No ICH   Pt is up in bed Smiling; pleasant Moving all 4s Cognition great Complains of sore throat     Allergies: Patient has no known allergies.  Medications: Prior to Admission medications   Medication Sig Start Date End Date Taking? Authorizing Provider  medroxyPROGESTERone (DEPO-PROVERA) 150 MG/ML injection Inject 1 mL (150 mg total) into the muscle every 3 (three) months. 12/06/19 10/11/20 Yes Larene Pickett, FNP  norethindrone (MICRONOR) 0.35 MG tablet Take 1 tablet (0.35 mg total) by mouth daily for 28 days. Patient not taking: Reported on 10/11/2020 10/06/19 11/03/19  Alberteen Spindle, CNM     Vital Signs: BP 110/68 (BP Location: Right Arm)   Pulse 74   Temp 98.4 F (36.9 C) (Oral)   Resp 17   Ht 5\' 9"  (1.753 m)   Wt 208 lb 8.9 oz (94.6 kg)   SpO2 98%   BMI 30.80 kg/m   Physical Exam Constitutional:      Comments: NAD Pleasant  Smile = Face symmetrical  HENT:     Mouth/Throat:     Mouth: Mucous membranes are moist.  Musculoskeletal:        General: No swelling. Normal range of motion.     Right lower leg: No edema.     Left lower leg: No edema.     Comments: Moves all 4s Follows all commands Good strength=  Rt groin soft NT no bleeding No hematoma Rt foot 2+ pulses  Skin:    General: Skin is warm.  Neurological:     Mental Status: She is alert and oriented to person, place, and time.  Psychiatric:        Behavior: Behavior normal.        Thought Content: Thought content normal.     Imaging: ECHOCARDIOGRAM COMPLETE  Result Date: 10/11/2020    ECHOCARDIOGRAM REPORT   Patient Name:   Kristen Grant Date of Exam: 10/11/2020 Medical Rec #:  12/11/2020      Height:       67.0 in Accession #:    [id number]     Weight:       208.6 lb Date of Birth:  August 27, 1995       BSA:          2.059 m Patient Age:    25 years       BP:           124/76 mmHg Patient Gender: F              HR:           76 bpm. Exam Location:  Inpatient Procedure: 2D Echo Indications:    stroke  History:        Patient has no prior history of Echocardiogram examinations.  Sonographer:    08/11/1995 RDCS Referring Phys: [phone] ERIC LINDZEN IMPRESSIONS  1. Left ventricular ejection fraction, by estimation, is 60 to 65%. The left ventricle has normal function. The left ventricle has no regional wall motion abnormalities. Left ventricular diastolic parameters were normal.  2. Right ventricular systolic function is normal. The right ventricular size is normal. Tricuspid regurgitation signal is inadequate for assessing PA pressure.  3. The mitral valve is normal in structure. No evidence of mitral valve regurgitation. No evidence of mitral stenosis.  4. The aortic valve was not well visualized. Aortic valve regurgitation is not visualized. No aortic stenosis is present. Conclusion(s)/Recommendation(s): No intracardiac source of embolism detected on this transthoracic study. A transesophageal echocardiogram is recommended to exclude cardiac source of embolism if clinically indicated. FINDINGS  Left Ventricle: Left ventricular ejection fraction, by estimation, is 60 to 65%. The left ventricle has normal function. The left ventricle has no regional wall motion abnormalities. The left ventricular internal cavity size was normal in size. There is  no left ventricular hypertrophy. Left ventricular diastolic parameters were normal. Right Ventricle: The right ventricular size is normal. No increase in right ventricular wall thickness.  Right ventricular systolic function is normal. Tricuspid regurgitation signal is inadequate for assessing PA pressure. Left Atrium: Left atrial size was normal in size. Right Atrium: Right atrial size was normal in size. Pericardium: There is no evidence of pericardial effusion. Mitral Valve: The mitral valve is normal in structure. No evidence of mitral valve regurgitation. No evidence of mitral valve stenosis. Tricuspid Valve: The tricuspid valve is normal in structure. Tricuspid valve regurgitation is trivial. Aortic Valve: The aortic valve was not well visualized. Aortic valve regurgitation is not visualized. No aortic stenosis is present. Pulmonic Valve: The pulmonic valve was not well visualized. Pulmonic valve regurgitation is not visualized. Aorta: The aortic root and ascending aorta are structurally normal, with no evidence of dilitation. IAS/Shunts: The interatrial septum was not well visualized.  LEFT VENTRICLE PLAX 2D LVIDd:         4.50 cm  Diastology LVIDs:         2.90 cm  LV e' medial:    10.60 cm/s LV PW:         0.90 cm  LV E/e' medial:  7.6 LV IVS:        0.80 cm  LV e' lateral:   15.90 cm/s LVOT diam:     1.90 cm  LV E/e' lateral: 5.0 LV SV:         62 LV SV Index:   30 LVOT Area:     2.84 cm  RIGHT VENTRICLE             IVC RV S prime:     12.90 cm/s  IVC diam: 1.10 cm TAPSE (M-mode): 2.3 cm LEFT ATRIUM             Index       RIGHT ATRIUM          Index LA diam:        3.20 cm 1.55 cm/m  RA Area:     9.88 cm LA Vol (A2C):   31.7 ml 15.40 ml/m RA Volume:   19.60 ml 9.52 ml/m LA Vol (A4C):   22.5 ml 10.93 ml/m LA Biplane Vol: 26.8 ml 13.02 ml/m  AORTIC VALVE LVOT Vmax:   126.00 cm/s LVOT Vmean:  81.500 cm/s LVOT VTI:    0.217 m  AORTA Ao Root diam: 2.80 cm Ao Asc diam:  2.60 cm MITRAL VALVE MV Area (PHT): 4.49 cm    SHUNTS MV Decel Time: 169 msec    Systemic VTI:  0.22 m MV E velocity: 80.10 cm/s  Systemic Diam: 1.90 cm MV A velocity: 51.80 cm/s MV E/A ratio:  1.55 Epifanio Lescheshristopher Schumann MD  Electronically signed by Epifanio Lescheshristopher Schumann MD Signature Date/Time: 10/11/2020/10:54:58 PM    Final    CT HEAD CODE STROKE WO CONTRAST  Result Date: 10/11/2020 CLINICAL DATA:  Code stroke.  Weakness EXAM: CT HEAD WITHOUT CONTRAST TECHNIQUE: Contiguous axial images were obtained from the base of the skull through the vertex without intravenous contrast. COMPARISON:  None. FINDINGS: Brain: There is hypodensity in the right temporal lobe. The remainder of the brain parenchyma is normal in appearance. There is no evidence of acute intracranial hemorrhage. There is no mass lesion. There is no midline shift. Vascular: There is a hyperdense vessel sign on the right. Skull: Normal. Negative for fracture or focal lesion. Sinuses/Orbits: There are mucous retention cysts in the bilateral maxillary and right sphenoid sinuses. The globes and orbits are unremarkable. Other: None. ASPECTS [facility]([place] Stroke Program Early CT Score) - Ganglionic level infarction (caudate, lentiform nuclei, internal capsule, insula, M1-M3 cortex): 3 - Supraganglionic infarction (M4-M6 cortex): 7 Total score (0-10 with 10 being normal): 10 IMPRESSION: 1. Hypodensity in the right anterior temporal lobe is concerning for infarct given hyperdense vessel sign on the right. Vascular imaging has already been performed at the time of dictation. 2. ASPECTS is 10 These results were called by telephone at the time of interpretation on 10/11/2020 at 9:55 am to provider DR Otelia LimesLindzen, who verbally acknowledged these results. Electronically Signed   By: Lesia HausenPeter  Noone M.D.   On: 10/11/2020 09:56   CT ANGIO HEAD NECK W WO CM W PERF (CODE STROKE)  Result Date: 10/11/2020 EXAM: CT ANGIOGRAPHY HEAD AND NECK CT PERFUSION BRAIN TECHNIQUE: Multidetector CT imaging of the head and neck was performed using the standard protocol during bolus administration of intravenous contrast. Multiplanar CT image reconstructions and MIPs were obtained to evaluate the vascular anatomy.  Carotid stenosis measurements (when applicable) are obtained utilizing NASCET criteria, using the distal internal carotid diameter as the denominator. Multiphase CT imaging of the brain was performed following IV bolus contrast injection. Subsequent parametric perfusion maps were calculated using RAPID software. CONTRAST:  100mL OMNIPAQUE IOHEXOL 350 MG/ML SOLN COMPARISON:  Same-day noncontrast CT head FINDINGS: CTA NECK FINDINGS Aortic arch: Standard branching. Imaged portion shows no evidence of aneurysm or dissection. No significant stenosis of the major arch vessel origins. Right carotid system: No evidence of dissection, stenosis (50% or greater) or occlusion. Left carotid system: No evidence of dissection, stenosis (50% or greater) or occlusion. Vertebral arteries: The left vertebral artery is dominant, a normal variant. The vertebral arteries are patent, without hemodynamically significant stenosis, occlusion, or dissection. Skeleton: The bones are normal. Other neck: The soft tissues are normal. Upper chest: The lung apices are clear. Review of the MIP images confirms the above findings CTA HEAD FINDINGS Anterior circulation: The bilateral intracranial ICAs are patent. There is thrombus in the distal right M1 segment with near complete occlusion. There is reconstitution of flow in the superior M2 division but no inferior M2 division is identified. The left MCA and bilateral ACAs are patent. There is no aneurysm. Posterior circulation: The V4 segments of the vertebral arteries are patent. The basilar artery is patent. The bilateral PCAs are patent. Venous sinuses: Not evaluated due to contrast timing. Anatomic variants: None. Review of the MIP images confirms the above findings CT Brain Perfusion Findings: CBF (<30%) Volume: 11mL Perfusion (Tmax>6.0s) volume: 86mL Mismatch Volume: 75mL Infarction Location:Right corona radiata IMPRESSION: 1. Thrombus in the distal right M1 segment with near complete occlusion.  There is reconstitution  of flow in superior M2 division but no inferior M2 division is identified. 2. 11 cc infarct core in the right corona radiata, 75 cc penumbra. These results were called by telephone at the time of interpretation on 10/11/2020 at 9:58 am to provider Dr Otelia Limes, who verbally acknowledged these results. Electronically Signed   By: Lesia Hausen M.D.   On: 10/11/2020 10:10    Labs:  CBC: Recent Labs    10/11/20 0936 10/11/20 0943 10/12/20 0018  WBC 6.3  --  8.5  HGB 13.5 12.9 13.2  HCT 40.4 38.0 39.3  PLT 283  --  304    COAGS: Recent Labs    10/11/20 0936  INR 1.0  APTT 27    BMP: Recent Labs    10/11/20 0936 10/11/20 0943 10/12/20 0018  NA 138 141 137  K 4.2 4.2 3.5  CL 110 109 107  CO2 24  --  19*  GLUCOSE 101* 100* 156*  BUN 7 9 6   CALCIUM 8.8*  --  8.9  CREATININE 0.56 0.50 0.50  GFRNONAA >60  --  >60    LIVER FUNCTION TESTS: Recent Labs    10/11/20 0936  BILITOT 0.8  AST 20  ALT 13  ALKPHOS 44  PROT 6.0*  ALBUMIN 3.8    Assessment and Plan:  L MCA occlusion L MCA revascularization in IR with Dr 12/11/20 9/9 Doing well Plan per Neuro May sit up in bed Will need PT to evaluate and determine activity  Electronically Signed: 11/9, PA-C 10/12/2020, 8:48 AM   I spent a total of 15 Minutes at the the patient's bedside AND on the patient's hospital floor or unit, greater than 50% of which was counseling/coordinating care for L MCA revasc

## 2020-10-12 NOTE — Sedation Documentation (Signed)
Right femoral sheath removed, 52fr angioseal closure device.

## 2020-10-12 NOTE — Progress Notes (Signed)
Patient ID: Kristen Grant, female   DOB: 04-12-1995, 25 y.o.   MRN: [id number] 69 y RT H F s/p RT MCA revascularization yesterday with near complete recovery,developed new onset Rt sided hemiplegia,,rt gaze preference and slurred speech at approx 305 pm. Code stroke called . CT brain No ICH .ASPECTS 4? CTA occluded dominant sup division. CTP CBF< 30 % 91ml and Tmax>6.0s 59ml;. Mismatch 8 ml. Findings reviewed with mother. Exact significance of the CTP studies were unclear for the patient. Area  of brain to save was small though critical in eloquence. . Given her age and excellent recovery from initial procedure ,and the time of onset of new symptoms  being very short,endovascular revascularization was an option . Risk of an ICH would be theoretically higher than that usually  quoted. She expressed understanding and provided consent to proceed. S.Micki Cassel MD

## 2020-10-12 NOTE — Progress Notes (Signed)
1500hrs: Patient observed being lethargic, having facial drooping and left sided weakness. Code stroke implemented. Notified attending MD  CTA done, patient brought to IR. Handover report given to IR RN and CRNA.

## 2020-10-12 NOTE — Anesthesia Procedure Notes (Addendum)
Arterial Line Insertion Start/End9/11/2020 4:33 AM, 10/12/2020 4:37 AM Performed by: Shelton Silvas, MD, Edmonia Caprio, CRNA, CRNA  Patient location: Pre-op. Preanesthetic checklist: patient identified, IV checked, site marked, risks and benefits discussed, surgical consent, monitors and equipment checked, pre-op evaluation, timeout performed and anesthesia consent Lidocaine 1% used for infiltration Left, radial was placed Catheter size: 20 G Hand hygiene performed  and maximum sterile barriers used   Attempts: 1 Procedure performed without using ultrasound guided technique. Following insertion, dressing applied and Biopatch. Post procedure assessment: normal and unchanged  Patient tolerated the procedure well with no immediate complications.

## 2020-10-12 NOTE — Code Documentation (Signed)
Stroke Response Nurse Documentation Code Documentation  [name] is a 25 y.o. female who arrived yesterday, 10/11/20,  because her boyfriend noticed that she suddenly became weak on her left side. She was noted to have facial droop. Today, 10/12/20, her RN noted that she had new facial drooping and left sided plegia at 1515. She has a past medical hx of stroke and hyperlipidemia. Code stroke was activated at 1518 by 2H  staff.  The patient's LKW was 1515. The patient's mother has been bedside all day and had not noted any new facial droop.   Stroke team at the bedside on patient arrival. Patient to CT with team. NIHSS 18, see documentation for details and code stroke times. The following imaging was completed:  CT, CTA, CTP. Marland Kitchen Patient is not a candidate for IV Thrombolytic due to her receiving TnK on 10/11/20. Patient was taken to IR for thrombectomy for her M2 occlusion.     Bedside handoff with IR RN    Andrey Spearman  Stroke Response RN

## 2020-10-12 NOTE — Progress Notes (Signed)
1000 hrs Patient went for MRI, this RN was notified by MRI staff regarding patient complaining of nausea and right groin pain. Patient observed in the monitor HR was 37-40. Once RN arrived in MRI department patient was awake and HR slowly recovering to NSR 60-70's. MRI was postponed, notified attending MD regarding patient status

## 2020-10-12 NOTE — Procedures (Signed)
S/P RT common carotid arteriogram  Followed by revascularization of occluded dominant sup division  RT MCA x1 pass with 4 mmc 40 mm solitaireX retriever and contact aspiration achieving a TICI2B -2C revascularizatiopn. CT Brain No ICH . Rt groin hemostasis with an 52F angioseal. Distal pulses all intact. Pupils 17mm RT = LT. Extubated. Marland Kitchen Opens eyes to command. Identifies 2 fingers. Able to bend Lt knee and lift Lt arm off the bed. S.Thuy Atilano MD

## 2020-10-12 NOTE — Anesthesia Postprocedure Evaluation (Signed)
Anesthesia Post Note  Patient: Kristen Grant  Procedure(s) Performed: RADIOLOGY WITH ANESTHESIA     Patient location during evaluation: SICU Anesthesia Type: General Level of consciousness: awake Pain management: pain level controlled Vital Signs Assessment: post-procedure vital signs reviewed and stable Respiratory status: spontaneous breathing, nonlabored ventilation, respiratory function stable and patient connected to nasal cannula oxygen Cardiovascular status: blood pressure returned to baseline and stable Postop Assessment: no apparent nausea or vomiting Anesthetic complications: no   No notable events documented.  Last Vitals:  Vitals:   10/12/20 1615 10/12/20 1945  BP: 113/63 (!) 115/56  Pulse: 80   Resp: 18 17  Temp: 36.8 C   SpO2:  100%    Last Pain:  Vitals:   10/12/20 1615  TempSrc: Oral  PainSc:                  Shelton Silvas

## 2020-10-12 NOTE — Progress Notes (Addendum)
Noted slight decr in LLE and LUE motor strength, able to lift both slightly (inches) off bed. No other changes to mNIHSS or neuro assessment. Called Dr Wilford Corner, neurology, to update. Will continue to monitor closely for any decline/changes. Cleviprex gtt remains off with SBP in goal range, levo gtt strung if needed for BP support.

## 2020-10-12 NOTE — Anesthesia Procedure Notes (Addendum)
Procedure Name: Intubation Date/Time: 10/12/2020 4:31 PM Performed by: Edmonia Caprio, CRNA Pre-anesthesia Checklist: Patient identified, Emergency Drugs available, Suction available and Patient being monitored Patient Re-evaluated:Patient Re-evaluated prior to induction Oxygen Delivery Method: Circle system utilized Preoxygenation: Pre-oxygenation with 100% oxygen Induction Type: IV induction Ventilation: Mask ventilation without difficulty Laryngoscope Size: Miller and 2 Grade View: Grade I Tube type: Oral Tube size: 7.0 mm Number of attempts: 1 Airway Equipment and Method: Stylet and Oral airway Placement Confirmation: ETT inserted through vocal cords under direct vision, positive ETCO2 and breath sounds checked- equal and bilateral Secured at: 22 cm Tube secured with: Tape Dental Injury: Teeth and Oropharynx as per pre-operative assessment

## 2020-10-12 NOTE — Progress Notes (Signed)
STROKE TEAM PROGRESS NOTE   INTERVAL HISTORY Patient is now s/p bilateral common carotid and left VA angiograms followed by complete revascularization of occluded Rt MCA.  Her mother is at the bedside providing support.   Vitals:   10/12/20 1225 10/12/20 1230 10/12/20 1245 10/12/20 1300  BP:    103/67  Pulse:    78  Resp:  12 11 11   Temp: 98.3 F (36.8 C)     TempSrc: Oral     SpO2:   97% 97%  Weight:      Height:       CBC:  Recent Labs  Lab 10/11/20 0936 10/11/20 0943 10/12/20 0018  WBC 6.3  --  8.5  NEUTROABS 3.0  --  7.4  HGB 13.5 12.9 13.2  HCT 40.4 38.0 39.3  MCV 89.0  --  86.9  PLT 283  --  304   Basic Metabolic Panel:  Recent Labs  Lab 10/11/20 0936 10/11/20 0943 10/12/20 0018  NA 138 141 137  K 4.2 4.2 3.5  CL 110 109 107  CO2 24  --  19*  GLUCOSE 101* 100* 156*  BUN 7 9 6   CREATININE 0.56 0.50 0.50  CALCIUM 8.8*  --  8.9    Lipid Panel:  Recent Labs  Lab 10/12/20 0018  CHOL 171  TRIG 55  HDL 43  CHOLHDL 4.0  VLDL 11  LDLCALC *    HgbA1c:  Recent Labs  Lab 10/12/20 0018  HGBA1C 5.0   Urine Drug Screen:  Recent Labs  Lab 10/11/20 2219  LABOPIA NONE DETECTED  COCAINSCRNUR NONE DETECTED  LABBENZ POSITIVE*  AMPHETMU NONE DETECTED  THCU POSITIVE*  LABBARB NONE DETECTED    Alcohol Level  Recent Labs  Lab 10/11/20 0936  ETH <10    IMAGING past 24 hours ECHOCARDIOGRAM COMPLETE  Result Date: 10/11/2020    ECHOCARDIOGRAM REPORT   Patient Name:   Kristen Grant Date of Exam: 10/11/2020 Medical Rec #:  [name]      Height:       67.0 in Accession #:    12/11/2020     Weight:       208.6 lb Date of Birth:  12-27-1995       BSA:          2.059 m Patient Age:    25 years       BP:           124/76 mmHg Patient Gender: F              HR:           76 bpm. Exam Location:  Inpatient Procedure: 2D Echo Indications:    stroke  History:        Patient has no prior history of Echocardiogram examinations.  Sonographer:    [id number] RDCS  Referring Phys: [phone] ERIC LINDZEN IMPRESSIONS  1. Left ventricular ejection fraction, by estimation, is 60 to 65%. The left ventricle has normal function. The left ventricle has no regional wall motion abnormalities. Left ventricular diastolic parameters were normal.  2. Right ventricular systolic function is normal. The right ventricular size is normal. Tricuspid regurgitation signal is inadequate for assessing PA pressure.  3. The mitral valve is normal in structure. No evidence of mitral valve regurgitation. No evidence of mitral stenosis.  4. The aortic valve was not well visualized. Aortic valve regurgitation is not visualized. No aortic stenosis is present. Conclusion(s)/Recommendation(s): No intracardiac source of embolism detected on  this transthoracic study. A transesophageal echocardiogram is recommended to exclude cardiac source of embolism if clinically indicated. FINDINGS  Left Ventricle: Left ventricular ejection fraction, by estimation, is 60 to 65%. The left ventricle has normal function. The left ventricle has no regional wall motion abnormalities. The left ventricular internal cavity size was normal in size. There is  no left ventricular hypertrophy. Left ventricular diastolic parameters were normal. Right Ventricle: The right ventricular size is normal. No increase in right ventricular wall thickness. Right ventricular systolic function is normal. Tricuspid regurgitation signal is inadequate for assessing PA pressure. Left Atrium: Left atrial size was normal in size. Right Atrium: Right atrial size was normal in size. Pericardium: There is no evidence of pericardial effusion. Mitral Valve: The mitral valve is normal in structure. No evidence of mitral valve regurgitation. No evidence of mitral valve stenosis. Tricuspid Valve: The tricuspid valve is normal in structure. Tricuspid valve regurgitation is trivial. Aortic Valve: The aortic valve was not well visualized. Aortic valve regurgitation is  not visualized. No aortic stenosis is present. Pulmonic Valve: The pulmonic valve was not well visualized. Pulmonic valve regurgitation is not visualized. Aorta: The aortic root and ascending aorta are structurally normal, with no evidence of dilitation. IAS/Shunts: The interatrial septum was not well visualized.  LEFT VENTRICLE PLAX 2D LVIDd:         4.50 cm  Diastology LVIDs:         2.90 cm  LV e' medial:    10.60 cm/s LV PW:         0.90 cm  LV E/e' medial:  7.6 LV IVS:        0.80 cm  LV e' lateral:   15.90 cm/s LVOT diam:     1.90 cm  LV E/e' lateral: 5.0 LV SV:         62 LV SV Index:   30 LVOT Area:     2.84 cm  RIGHT VENTRICLE             IVC RV S prime:     12.90 cm/s  IVC diam: 1.10 cm TAPSE (M-mode): 2.3 cm LEFT ATRIUM             Index       RIGHT ATRIUM          Index LA diam:        3.20 cm 1.55 cm/m  RA Area:     9.88 cm LA Vol (A2C):   31.7 ml 15.40 ml/m RA Volume:   19.60 ml 9.52 ml/m LA Vol (A4C):   22.5 ml 10.93 ml/m LA Biplane Vol: 26.8 ml 13.02 ml/m  AORTIC VALVE LVOT Vmax:   126.00 cm/s LVOT Vmean:  81.500 cm/s LVOT VTI:    0.217 m  AORTA Ao Root diam: 2.80 cm Ao Asc diam:  2.60 cm MITRAL VALVE MV Area (PHT): 4.49 cm    SHUNTS MV Decel Time: 169 msec    Systemic VTI:  0.22 m MV E velocity: 80.10 cm/s  Systemic Diam: 1.90 cm MV A velocity: 51.80 cm/s MV E/A ratio:  1.55 Epifanio Lesches MD Electronically signed by Epifanio Lesches MD Signature Date/Time: 10/11/2020/10:54:58 PM    Final     PHYSICAL EXAM   Physical Exam  Constitutional: Appears well-developed and well-nourished.  Psych: Affect appropriate to situation Eyes: No scleral injection HENT: No OP obstrucion MSK: no joint deformities.  Cardiovascular: Normal rate and regular rhythm.  Respiratory: Effort normal, non-labored breathing GI: Soft.  No  distension. There is no tenderness.  Skin: WDI  Neuro: Mental Status: Patient is awake, alert, oriented to person, place, month, year, and situation.  Patient is  able to give a clear and coherent history.  No signs of aphasia or neglect  Cranial Nerves: II: Visual Fields are full. Pupils are equal, round, and reactive to light.    III,IV, VI: EOMI without ptosis or diploplia.  V: Facial sensation is symmetric to temperature VII: Facial movement is symmetric resting and smiling VIII: Hearing is intact to voice X: Palate elevates symmetrically XI: Shoulder shrug is symmetric. XII: Tongue protrudes midline without atrophy or fasciculations.  Motor: Tone is normal. Bulk is normal. 5/5 strength was present only at right arm. Right leg has moderate pain from procedure and patient unwilling to be examined. Left side with 4/5 strength. Sensory: Sensation is symmetric to light touch and temperature in the arms and legs. No extinction to DSS present.   Deep Tendon Reflexes: 2+ and symmetric in the biceps and patellae.   Plantars: Toes are downgoing bilaterally.   Cerebellar: FNF and HKS are intact bilaterally   ASSESSMENT/PLAN Kristen Grant is a 25 y.o. female with history of anxiety, asthma, on birth control who presents to the ED via EMS as a Code Stroke after her boyfriend noted her to suddenly become weak on her left side shortly after waking up this morning. LKN was 0815 after waking. She was still lying in bed when her partner noticed that the left side of her face was drooping, she was drooling and the left side of her body was weak. On EMS arrival, they noted same. CBG was 106. On arrival to the ED, she had slight right gaze deviation, left facial droop, dysarthria and inability to move/lift her LUE and LLE. She was able to slowly answer questions. She was complaining of a right sided headache.   STAT CT head revealed dense right M1 and M2 segments with equivocal early hypodensity in the right temporal lobe.    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  Code Stroke  CT head hypodensity at  right anterior temporal lobe.   ASPECTS 10.     CTA head & neck:   1. Thrombus in the distal right M1 segment with near complete occlusion. There is reconstitution of flow in superior M2 division but no inferior M2 division is identified. 2. 11 cc infarct core in the right corona radiata, 75 cc penumbra. CT perfusion : CBF (<30%) Volume: 80mL Perfusion (Tmax>6.0s) volume: 47mL Mismatch Volume: 14mL Infarction Location:Right corona radiata Cerebral angio : report from Dr. Corliss Skains:  bilateral common carotid and Lt VA angiograms followed by complete revascularization of occluded LT MCA M1 seg with x pass with contact aspiration and x1 pass with solitaire 26mm x 40 mm retriever and contact aspiration achieving a TICI 3 revascularization. Post CT brain No ICH . Consider hypercoagulable workup as outpt.   MRI  pending 2D Echo LVEF 60-65%, normal LA, poorly visualized interatrial septum  LDL 117 HgbA1c 5.0 VTE prophylaxis - scd    Diet   Diet heart healthy/carb modified Room service appropriate? Yes; Fluid consistency: Thin   No antithrombotic prior to admission, now on No antithrombotic.   Therapy recommendations:   pending Disposition:  likely home     Hyperlipidemia Home meds:   none,    LDL 117, goal < 70 Add  lipitor 80mg  daily   High  intensity statin   Continue statin at discharge    HgbA1c 5.0, goal < 7.0 CBGs Recent Labs    10/11/20 0935  GLUCAP 106*    SSI  Other Stroke Risk Factors  Birth control use   Substance abuse - UDS:  THC POSITIVE, Cocaine NONE DETECTED. Patient advised to stop using due to stroke risk.  Obesity, Body mass index is 30.8 kg/m., BMI >/= 30 associated with increased stroke risk, recommend weight loss, diet and exercise as appropriate   Family hx stroke (grandmother, multiple aunts)  Other Active Problems    Hospital day # 1    To contact Stroke Continuity provider, please refer to [email or web address]. After hours, contact General Neurology

## 2020-10-12 NOTE — Anesthesia Preprocedure Evaluation (Addendum)
Anesthesia Evaluation  Patient identified by MRN, date of birth, ID band  Reviewed: Unable to perform ROS - Chart review onlyPreop documentation limited or incomplete due to emergent nature of procedure.  Airway Mallampati: II  TM Distance: >3 FB Neck ROM: Full    Dental  (+) Teeth Intact   Pulmonary asthma , Current Smoker,    breath sounds clear to auscultation       Cardiovascular  Rhythm:Regular Rate:Normal     Neuro/Psych  Headaches, PSYCHIATRIC DISORDERS Anxiety CVA, Residual Symptoms    GI/Hepatic   Endo/Other    Renal/GU      Musculoskeletal   Abdominal Normal abdominal exam  (+)   Peds  Hematology   Anesthesia Other Findings   Reproductive/Obstetrics                             Anesthesia Physical Anesthesia Plan  ASA: 3 and emergent  Anesthesia Plan: General   Post-op Pain Management:    Induction: Intravenous, Rapid sequence and Cricoid pressure planned  PONV Risk Score and Plan: 2 and Ondansetron, Treatment may vary due to age or medical condition and Dexamethasone  Airway Management Planned: Oral ETT  Additional Equipment: Arterial line  Intra-op Plan:   Post-operative Plan: Possible Post-op intubation/ventilation  Informed Consent: I have reviewed the patients History and Physical, chart, labs and discussed the procedure including the risks, benefits and alternatives for the proposed anesthesia with the patient or authorized representative who has indicated his/her understanding and acceptance.     History available from chart only and Only emergency history available  Plan Discussed with: CRNA  Anesthesia Plan Comments: (Lab Results      Component                Value               Date                      WBC                      8.5                 10/12/2020                HGB                      13.2                10/12/2020                HCT                       39.3                10/12/2020                MCV                      86.9                10/12/2020                PLT                      304  10/12/2020            Lab Results      Component                Value               Date                      CREATININE               0.50                10/12/2020                BUN                      6                   10/12/2020                NA                       137                 10/12/2020                K                        3.5                 10/12/2020                CL                       107                 10/12/2020                CO2                      19 (L)              10/12/2020           )       Anesthesia Quick Evaluation

## 2020-10-12 NOTE — Consult Note (Signed)
NAME:  Kristen Grant, MRN:  [id number], DOB:  Apr 15, 1995, LOS: 1 ADMISSION DATE:  10/11/2020, CONSULTATION DATE:  10/12/2020 REFERRING MD:  Dr. Gerilyn Pilgrim, CHIEF COMPLAINT:  L M2 occlusion s/p thrombectomy   History of Present Illness:  Patient is a 25 yo F w/ pertinent PMH of asthma and migraine HA present to [facility] on 9/9 with L sided weakness (code stroke).  Patient suddenly became weak on left side soon after waking up on 9/9. LKN was 0815 am 9/9.  Partner noticed L sided facial drooping/drooling and her body was weak. EMS arrived and noticed slight R gaze deviation, left facial droop, dysarthria, and inability to move LUE/LLE. Patient complained of R sided HA.  Upon arrival 9/9 to [facility] ED, CT head showed R M1 and M2 segments w/ hypodensity in R temporal lobe. Patient risk factors are use of estrogenic progestogen birth control use and vaping. TNK was administered. Hypercoagulable panel ordered. Patient went to IR for b/l common carotid and Lt VA angiograms followed by complete revascularization of occluded LT MCA M1.   On 9/10 3:05 in the afternoon, patient had L hemiplegia, right gaze, and slurred speech. Neuro contacted and sent for repeat CTA which showed: showed proximal right M2 occlusion. Patient taken to IR for thrombectomy. Patient taken back to ICU extubated and on cleviprex.  PCCM consulted for icu medical management.    Pertinent  Medical History   Past Medical History:  Diagnosis Date   Anxiety    Asthma    no treatments required at this time   Migraine headache      Significant Hospital Events: Including procedures, antibiotic start and stop dates in addition to other pertinent events   9/9: admitted to [facility] for R m1 and m2 occlusion 9/10: patient had another stroke and taken to IR for thrombectomy. On cleviprex  Interim History / Subjective:  Patient back from thrombectomy On simple mask; sats 100% Patient is Alert/oriented; RUE stronger than LUE; weak BLE; PERRL; left  sided facial droop present when smiling; able to stick tongue out  Objective   Blood pressure 113/63, pulse 80, temperature 98.3 F (36.8 C), temperature source Oral, resp. rate 18, height 5\' 9"  (1.753 m), weight 94.6 kg, SpO2 98 %.        Intake/Output Summary (Last 24 hours) at 10/12/2020 1917 Last data filed at 10/12/2020 1853 Gross per 24 hour  Intake 1588.06 ml  Output 3675 ml  Net -2086.94 ml   Filed Weights   10/11/20 0900  Weight: 94.6 kg    Examination: General:  NAD HEENT: MM pink/moist; simple oxygen mask in place Neuro: Alert/oriented; RUE stronger than LUE; weak BLE; PERRL; left sided facial droop present when smiling; able to stick tongue out CV: s1s2, RRR, no m/r/g, on cleviprex PULM:  dim clear BS bilaterally; simple oxygen mask in place sats 100% GI: soft, bsx4 active  Extremities: warm/dry, no edema  Skin: no rashes or lesions   Labs Co2 19 Glucose 156, A1C 5  9/9: CT head revealed dense right M1 and M2 segments with equivocal early hypodensity in the right temporal lobe  Resolved Hospital Problem list     Assessment & Plan:  Right M1/M2 occlusion s/p thrombectomy P: -Per neuro/stroke team -admit to ICU -BP goal 120-140 per neuro: clevidipine and levophed ordered to maintain BP goal -continue verapamil -cbg monitoring; glucose goal 140-180. -repeat CT post thrombectomy -consider repeat MRI per neuro -follow hypercoagulable panel results -continue statin -frequent neuro checks -avoid fevers: prn  tylenol -PT/OT/SLP -SCDs in place -trend bmp/cbc  Hx of asthma P: -prn albuterol  Best Practice (right click and "Reselect all SmartList Selections" daily)   Diet/type: NPO DVT prophylaxis: SCD GI prophylaxis: PPI Lines: Arterial Line Foley:  Yes, and it is still needed Code Status:  full code Last date of multidisciplinary goals of care discussion [per primary]  Labs   CBC: Recent Labs  Lab 10/11/20 0936 10/11/20 0943 10/12/20 0018   WBC 6.3  --  8.5  NEUTROABS 3.0  --  7.4  HGB 13.5 12.9 13.2  HCT 40.4 38.0 39.3  MCV 89.0  --  86.9  PLT 283  --  304    Basic Metabolic Panel: Recent Labs  Lab 10/11/20 0936 10/11/20 0943 10/12/20 0018  NA 138 141 137  K 4.2 4.2 3.5  CL 110 109 107  CO2 24  --  19*  GLUCOSE 101* 100* 156*  BUN 7 9 6   CREATININE 0.56 0.50 0.50  CALCIUM 8.8*  --  8.9   GFR: Estimated Creatinine Clearance: 131.7 mL/min (by C-G formula based on SCr of 0.5 mg/dL). Recent Labs  Lab 10/11/20 0936 10/12/20 0018  WBC 6.3 8.5    Liver Function Tests: Recent Labs  Lab 10/11/20 0936  AST 20  ALT 13  ALKPHOS 44  BILITOT 0.8  PROT 6.0*  ALBUMIN 3.8   No results for input(s): LIPASE, AMYLASE in the last 168 hours. No results for input(s): AMMONIA in the last 168 hours.  ABG    Component Value Date/Time   TCO2 25 10/11/2020 0943     Coagulation Profile: Recent Labs  Lab 10/11/20 0936  INR 1.0    Cardiac Enzymes: No results for input(s): CKTOTAL, CKMB, CKMBINDEX, TROPONINI in the last 168 hours.  HbA1C: Hgb A1c MFr Bld  Date/Time Value Ref Range Status  10/12/2020 12:18 AM 5.0 4.8 - 5.6 % Final    Comment:    (NOTE) Pre diabetes:          5.7%-6.4%  Diabetes:              >6.4%  Glycemic control for   <7.0% adults with diabetes     CBG: Recent Labs  Lab 10/11/20 0935  GLUCAP 106*    Review of Systems:   Unable to obtain. Patient intubated. Obtained information from chart review and nurse.  Past Medical History:  She,  has a past medical history of Anxiety, Asthma, and Migraine headache.   Surgical History:   Past Surgical History:  Procedure Laterality Date   NO PAST SURGERIES       Social History:   reports that she has been smoking e-cigarettes. She started smoking about 20 months ago. She has never used smokeless tobacco. She reports that she does not currently use alcohol. She reports current drug use. Frequency: 3.00 times per week. Drug:  Marijuana.   Family History:  Her family history includes Anxiety disorder in her mother; Depression in her mother; Diabetes in her maternal grandmother; Epilepsy in her mother; Hypertension in her maternal grandmother and mother; Migraines in her mother; Stroke in her maternal grandmother. There is no history of Breast cancer.   Allergies No Known Allergies   Home Medications  Prior to Admission medications   Medication Sig Start Date End Date Taking? Authorizing Provider  medroxyPROGESTERone (DEPO-PROVERA) 150 MG/ML injection Inject 1 mL (150 mg total) into the muscle every 3 (three) months. 12/06/19 10/12/20 Yes 12/12/20, FNP  norethindrone (MICRONOR) 0.35 MG  tablet Take 1 tablet (0.35 mg total) by mouth daily for 28 days. Patient not taking: Reported on 10/11/2020 10/06/19 11/03/19  Alberteen Spindle, CNM     Critical care time: 45 minutes    JD Anselm Lis [facility] 10/12/2020, 7:17 PM  Please see [email or web address] for pager details.  From 7A-7P if no response, please call [phone]. After hours, please call ELink [phone].

## 2020-10-12 NOTE — Progress Notes (Signed)
OT Cancellation Note  Patient Details Name: Kristen Grant MRN: [id number] DOB: [birth year]   Cancelled Treatment:    Reason Eval/Treat Not Completed:  (Pt on bedrest per RN note with gradual raising of HOB. Will follow.)  Evern Bio 10/12/2020, 12:43 PM Martie Round, OTR/L Acute Rehabilitation Services Pager: [phone] Office: [phone]

## 2020-10-12 NOTE — Anesthesia Postprocedure Evaluation (Signed)
Anesthesia Post Note  Patient: Kristen Grant  Procedure(s) Performed: IR WITH ANESTHESIA     Patient location during evaluation: PACU Anesthesia Type: General Level of consciousness: awake Pain management: pain level controlled Vital Signs Assessment: post-procedure vital signs reviewed and stable Respiratory status: spontaneous breathing, nonlabored ventilation, respiratory function stable and patient connected to nasal cannula oxygen Cardiovascular status: blood pressure returned to baseline and stable Postop Assessment: no apparent nausea or vomiting Anesthetic complications: no   No notable events documented.  Last Vitals:  Vitals:   10/12/20 0415 10/12/20 0430  BP: (!) 105/57 101/60  Pulse:    Resp: 12 14  Temp:    SpO2: 98% 97%    Last Pain:  Vitals:   10/12/20 0400  TempSrc:   PainSc: 0-No pain                 Brallan Denio P Danecia Underdown

## 2020-10-12 NOTE — Progress Notes (Signed)
Mom and sister updated at bedside by Dr Wilford Corner, neurology and Dr Ardeth Perfect CCM,  Goal for very tight BP control, SBP 120-140. Weaning cleviprex for goal

## 2020-10-12 NOTE — Progress Notes (Signed)
S/P redo thrombectomy for L MCA -M2 branch occlusion. Extubated and more responsive. Alert " I doing OK" Follows all commands. VFF; L lower facial plegia; LUE 4/5 w mild drift; LLE 2-3/5 with marked drift.    This patient is critically ill due to stroke post procedure and at significant risk of neurological worsening, death form severe anemia, bleeding, recurrent stroke, intracranial stenosis. This patient's care requires constant monitoring of vital signs, hemodynamics, respiratory and cardiac monitoring, review of multiple databases, neurological assessment, other specialists and medical decision making of high complexity. 180 minutes of neurocritical care time in the care of this patient

## 2020-10-12 NOTE — Progress Notes (Signed)
   Called by RN  Pt reporting Rt groin pain after visit to MR RN describes while in MRI--pt had drop in HR to 40s Sweaty; pale  I have seen and evaluated pt myself She is now stable with HR 74 and BP 110/68  Rt groin is sl tender No hematoma No bleeding Clean and dry Rt foot warm; 2+ pulses  Pt is much better per report  Discussed with Dr Corliss Skains Rec:  flat bed rest x 2 hrs May raise HOB 30 degrees in 2 hrs May gradually raiseHOB there after

## 2020-10-12 NOTE — Progress Notes (Signed)
SLP Cancellation Note  Patient Details Name: Kristen Grant MRN: [id number] DOB: 1995-12-24   Cancelled treatment:       Reason Eval/Treat Not Completed: SLP screened, no needs identified, will sign off; spoke with pt; dysarthria and other symptoms resolved; passed Yale swallow screen and no dysphagia indicated by nursing/pt.  ST will s/o; please re-consult prn.   Tressie Stalker, M.S., CCC-SLP 10/12/2020, 10:31 AM

## 2020-10-13 ENCOUNTER — Inpatient Hospital Stay (HOSPITAL_COMMUNITY): Payer: Managed Care, Other (non HMO)

## 2020-10-13 DIAGNOSIS — I6601 Occlusion and stenosis of right middle cerebral artery: Secondary | ICD-10-CM | POA: Diagnosis not present

## 2020-10-13 DIAGNOSIS — I639 Cerebral infarction, unspecified: Secondary | ICD-10-CM | POA: Diagnosis not present

## 2020-10-13 LAB — COMPREHENSIVE METABOLIC PANEL
ALT: 14 U/L (ref 0–44)
AST: 14 U/L — ABNORMAL LOW (ref 15–41)
Albumin: 3.3 g/dL — ABNORMAL LOW (ref 3.5–5.0)
Alkaline Phosphatase: 37 U/L — ABNORMAL LOW (ref 38–126)
Anion gap: 7 (ref 5–15)
BUN: 6 mg/dL (ref 6–20)
CO2: 21 mmol/L — ABNORMAL LOW (ref 22–32)
Calcium: 8.4 mg/dL — ABNORMAL LOW (ref 8.9–10.3)
Chloride: 112 mmol/L — ABNORMAL HIGH (ref 98–111)
Creatinine, Ser: 0.45 mg/dL (ref 0.44–1.00)
GFR, Estimated: >60 mL/min (ref >60)
Glucose, Bld: 139 mg/dL — ABNORMAL HIGH (ref 70–99)
Potassium: 3.6 mmol/L (ref 3.5–5.1)
Sodium: 140 mmol/L (ref 135–145)
Total Bilirubin: 1.3 mg/dL — ABNORMAL HIGH (ref 0.3–1.2)
Total Protein: 5.7 g/dL — ABNORMAL LOW (ref 6.5–8.1)

## 2020-10-13 LAB — CBC WITH DIFFERENTIAL/PLATELET
Abs Immature Granulocytes: 0.05 10*3/uL (ref 0.00–0.07)
Basophils Absolute: 0 10*3/uL (ref 0.0–0.1)
Basophils Relative: 0 %
Eosinophils Absolute: 0 10*3/uL (ref 0.0–0.5)
Eosinophils Relative: 0 %
HCT: 34.4 % — ABNORMAL LOW (ref 36.0–46.0)
Hemoglobin: 11.6 g/dL — ABNORMAL LOW (ref 12.0–15.0)
Immature Granulocytes: 0 %
Lymphocytes Relative: 10 %
Lymphs Abs: 1.3 10*3/uL (ref 0.7–4.0)
MCH: 30.1 pg (ref 26.0–34.0)
MCHC: 33.7 g/dL (ref 30.0–36.0)
MCV: 89.4 fL (ref 80.0–100.0)
Monocytes Absolute: 0.8 10*3/uL (ref 0.1–1.0)
Monocytes Relative: 6 %
Neutro Abs: 11.2 10*3/uL — ABNORMAL HIGH (ref 1.7–7.7)
Neutrophils Relative %: 84 %
Platelets: 290 10*3/uL (ref 150–400)
RBC: 3.85 MIL/uL — ABNORMAL LOW (ref 3.87–5.11)
RDW: 13.9 % (ref 11.5–15.5)
WBC: 13.4 10*3/uL — ABNORMAL HIGH (ref 4.0–10.5)
nRBC: 0 % (ref 0.0–0.2)

## 2020-10-13 LAB — GLUCOSE, CAPILLARY
Glucose-Capillary: 129 mg/dL — ABNORMAL HIGH (ref 70–99)
Glucose-Capillary: 95 mg/dL (ref 70–99)

## 2020-10-13 LAB — RPR: RPR Ser Ql: NONREACTIVE

## 2020-10-13 LAB — MAGNESIUM: Magnesium: 1.7 mg/dL (ref 1.7–2.4)

## 2020-10-13 LAB — SEDIMENTATION RATE: Sed Rate: 2 mm/hr (ref 0–22)

## 2020-10-13 MED ORDER — POTASSIUM CHLORIDE 10 MEQ/100ML IV SOLN
10.0000 meq | INTRAVENOUS | Status: AC
  Administered 2020-10-13 (×3): 10 meq via INTRAVENOUS
  Filled 2020-10-13 (×2): qty 100

## 2020-10-13 MED ORDER — POLYETHYLENE GLYCOL 3350 17 G PO PACK
17.0000 g | PACK | Freq: Every day | ORAL | Status: DC
  Administered 2020-10-14 – 2020-10-17 (×3): 17 g via ORAL
  Filled 2020-10-13 (×3): qty 1

## 2020-10-13 MED ORDER — MAGNESIUM SULFATE 2 GM/50ML IV SOLN
2.0000 g | Freq: Once | INTRAVENOUS | Status: AC
  Administered 2020-10-13: 2 g via INTRAVENOUS
  Filled 2020-10-13: qty 50

## 2020-10-13 MED ORDER — ASPIRIN EC 81 MG PO TBEC
81.0000 mg | DELAYED_RELEASE_TABLET | Freq: Every day | ORAL | Status: DC
  Administered 2020-10-13 – 2020-10-17 (×5): 81 mg via ORAL
  Filled 2020-10-13 (×5): qty 1

## 2020-10-13 MED ORDER — POTASSIUM CHLORIDE 10 MEQ/100ML IV SOLN
10.0000 meq | Freq: Once | INTRAVENOUS | Status: AC
  Administered 2020-10-13: 10 meq via INTRAVENOUS

## 2020-10-13 MED ORDER — DOCUSATE SODIUM 100 MG PO CAPS
100.0000 mg | ORAL_CAPSULE | Freq: Two times a day (BID) | ORAL | Status: DC
  Administered 2020-10-13 – 2020-10-17 (×7): 100 mg via ORAL
  Filled 2020-10-13 (×7): qty 1

## 2020-10-13 MED ORDER — ASPIRIN EC 81 MG PO TBEC: 81.0000 mg | DELAYED_RELEASE_TABLET | Freq: Every day | ORAL | Status: DC

## 2020-10-13 NOTE — Progress Notes (Addendum)
STROKE TEAM PROGRESS NOTE   INTERVAL HISTORY Patient is now s/p bilateral common carotid and left VA angiograms followed by complete revascularization of occluded Rt MCA on 9/9/. Pt had reocclusion of right mca with repeat angioplasty on 9/10.   Her mother and sister are at the bedside providing support.   Vitals:   10/13/20 1000 10/13/20 1012 10/13/20 1100 10/13/20 1200  BP: 120/73  112/68 112/70  Pulse:  76    Resp: 19  17 19   Temp:      TempSrc:      SpO2: 96% 98% 97% 99%  Weight:      Height:       CBC:  Recent Labs  Lab 10/12/20 0018 10/13/20 0308  WBC 8.5 13.4*  NEUTROABS 7.4 11.2*  HGB 13.2 11.6*  HCT 39.3 34.4*  MCV 86.9 89.4  PLT 304 290    Basic Metabolic Panel:  Recent Labs  Lab 10/12/20 0018 10/13/20 0308  NA 137 140  K 3.5 3.6  CL 107 112*  CO2 19* 21*  GLUCOSE 156* 139*  BUN 6 6  CREATININE 0.50 0.45  CALCIUM 8.9 8.4*  MG  --  1.7     Lipid Panel:  Recent Labs  Lab 10/12/20 0018  CHOL 171  TRIG 55  HDL 43  CHOLHDL 4.0  VLDL 11  LDLCALC 12/12/20*     HgbA1c:  Recent Labs  Lab 10/12/20 0018  HGBA1C 5.0    Urine Drug Screen:  Recent Labs  Lab 10/11/20 2219  LABOPIA NONE DETECTED  COCAINSCRNUR NONE DETECTED  LABBENZ POSITIVE*  AMPHETMU NONE DETECTED  THCU POSITIVE*  LABBARB NONE DETECTED     Alcohol Level  Recent Labs  Lab 10/11/20 0936  ETH <10     IMAGING past 24 hours CT HEAD WO CONTRAST (12/11/20)  Result Date: 10/12/2020 CLINICAL DATA:  Follow-up examination for acute stroke, status post thrombectomy. EXAM: CT HEAD WITHOUT CONTRAST TECHNIQUE: Contiguous axial images were obtained from the base of the skull through the vertex without intravenous contrast. COMPARISON:  Prior CTA from earlier the same day. FINDINGS: Brain: Loss of gray-white matter differentiation involving the right insular region as well as the overlying right frontal operculum. Fogging at the right caudate and lentiform nuclei present as well. Findings  consistent with evolving right MCA distribution infarct. Superimposed hyperdensity at the level of the right sylvian fissure could reflect contrast staining or possibly small amount of subarachnoid hemorrhage (series 2, image 20). No significant mass effect at this time. Remainder the brain is otherwise normal in appearance. No other hemorrhage or acute large vessel territory infarct. No mass lesion or midline shift. No hydrocephalus. No extra-axial fluid collection. Vascular: No visible hyperdense vessel. Skull: Scalp soft tissues and calvarium demonstrate no new abnormality. Sinuses/Orbits: Globes and orbital soft tissues within normal limits. Scattered mucosal thickening in opacity present within the sphenoid ethmoidal and maxillary sinuses with a few scattered retention cyst. Mastoid air cells are clear. Other: None. IMPRESSION: 1. Evolving acute right MCA distribution infarct. Superimposed hyperdensity at the level of the right Sylvian fissure could reflect contrast staining or possibly small amount of subarachnoid hemorrhage. Short interval follow-up CT suggested. No significant mass effect at this time. 2. No other new acute intracranial abnormality. These results were communicated to Dr. 12/12/2020 at 8:55 pm on 10/12/2020 by text page via the [facility] messaging system. Electronically Signed   By: [facility] M.D.   On: 10/12/2020 20:56   CT ANGIO HEAD NECK W WO  CM W PERF (CODE STROKE)  Result Date: 10/12/2020 CLINICAL DATA:  Neuro deficit, acute, stroke suspected EXAM: CT ANGIOGRAPHY HEAD AND NECK CT PERFUSION BRAIN TECHNIQUE: Multidetector CT imaging of the head and neck was performed using the standard protocol during bolus administration of intravenous contrast. Multiplanar CT image reconstructions and MIPs were obtained to evaluate the vascular anatomy. Carotid stenosis measurements (when applicable) are obtained utilizing NASCET criteria, using the distal internal carotid diameter as the  denominator. Multiphase CT imaging of the brain was performed following IV bolus contrast injection. Subsequent parametric perfusion maps were calculated using RAPID software. CONTRAST:  OMNIPAQUE IOHEXOL 350 MG/ML SOLN COMPARISON:  10/11/2020. FINDINGS: CT HEAD FINDINGS Brain: Hypoattenuation and loss of gray-white differentiation in the right MCA territory, including the right frontal lobe, right insula, internal capsule right lentiform nucleus, caudate, and inferior right frontal cortex/operculum. Slight midline shift. No acute hemorrhage, hydrocephalus, mass lesion, or visible extra-axial fluid collection. ASPECTS [facility] Stroke Program Early CT Score) - Ganglionic level infarction (caudate, lentiform nuclei, internal capsule, insula, M1-M3 cortex): 1 - Supraganglionic infarction (M4-M6 cortex): 3 Total score (0-10 with 10 being normal): 4 Vascular: See below. Skull: No acute fracture. Sinuses: No acute findings. Orbits: No acute findings. Review of the MIP images confirms the above findings CTA NECK FINDINGS Aortic arch: Great vessel origins are patent. Right carotid system: No evidence of dissection, stenosis (50% or greater) or occlusion. Left carotid system: No evidence of dissection, stenosis (50% or greater) or occlusion. Vertebral arteries: Mildly left dominant. No significant (greater than 50%) stenosis. Skeleton: No acute abnormality. Other neck: No acute abnormality. CTA HEAD FINDINGS Anterior circulation: Bilateral ICAs and M1 MCAs are patent. Occlusion a of an anterior right M2 MCA branch (see series 11, images 237 through 240). Bilateral ACAs are patent. Posterior circulation: Bilateral intradural vertebral arteries basilar artery and posterior cerebral arteries are patent without proximal high-grade stenosis. Venous: No evidence of dural sinus thrombosis. CT Brain Perfusion Findings: ASPECTS: 4 CBF (<30%) Volume: 52mL Perfusion (Tmax>6.0s) volume: 31mL Mismatch Volume: 81mL Infarction  Location:Right MCA territory IMPRESSION: CT Head: 1. 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 ASPECTS is 4. Slight leftward midline shift. 2. No acute hemorrhage. CTA: 1. Proximal right M2 MCA occlusion. 2. No significant stenosis in the neck. CT Perfusion: Large area of core infarct in the right MCA territory, measuring at least 55 mL and likely underestimated given superior extent is not imaged. Small (8 mL) area of ischemic penumbra. Findings discussed with Dr. Otelia Limes via telephone at 4:00 PM Electronically Signed   By: Feliberto Harts M.D.   On: 10/12/2020 16:26    PHYSICAL EXAM   Physical Exam  Constitutional: Appears well-developed and well-nourished.  Psych: Affect appropriate to situation Eyes: No scleral injection HENT: No OP obstrucion MSK: no joint deformities.  Cardiovascular: Normal rate and regular rhythm.  Respiratory: Effort normal, non-labored breathing GI: Soft.  No distension. There is no tenderness.  Skin: WDI  Neuro: Mental Status: Patient is awake, alert, oriented to person, place, month, year, and situation.  Patient is dysarthric at time of exam, but is able to be understood in conversation.  No signs of aphasia or neglect   Cranial Nerves: II: Visual Fields are full. Pupils are equal, round, and reactive to light.    III,IV, VI: EOMI without ptosis or diploplia.  V: Facial sensation is asymmetric to temperature/ decreased sensation at left  hemiface VII: Facial movement is symmetric resting and smiling VIII: Hearing is intact to voice X: Palate elevates symmetrically XI: Shoulder shrug is asymmetric. XII: Tongue protrudes midline without atrophy or fasciculations.  Motor: Tone is normal. Bulk is normal. 5/5 strength was present only at right arm. Right leg has moderate pain from procedure and patient unwilling to be examined. Left side  remains plegic. Patient only able to move left arm weakly in bed, unable  to lift off bed at time of this exam. Lifts leg off weakly off bed transiently. Sensory: Sensation is asymmetric to light touch and temperature in the arms and legs. Markedly decreased sensation at left hemibdy.   Plantars: Toes are downgoing bilaterally.   Cerebellar: FNF and HKS are intact bilaterally   ASSESSMENT/PLAN Ms. Kristen Grant is a 25 y.o. female with history of anxiety, asthma, on birth control who presents to the ED via EMS as a Code Stroke after her boyfriend noted her to suddenly become weak on her left side shortly after waking up this morning. LKN was 0815 after waking. She was still lying in bed when her partner noticed that the left side of her face was drooping, she was drooling and the left side of her body was weak. On EMS arrival, they noted same. CBG was 106. On arrival to the ED, she had slight right gaze deviation, left facial droop, dysarthria and inability to move/lift her LUE and LLE. She was able to slowly answer questions. She was complaining of a right sided headache.   STAT CT head revealed dense right M1 and M2 segments with equivocal early hypodensity in the right temporal lobe. she was taken on 9/9 for revascularization of right MCA then had reocclusion of same on 9/10 and underwent repeat angioplasty on 9/10. Developed persistent left hemiplegia. CTH on 9/10 post angioplasty showed possible small amount of subarachnoid hemorrhage versus contrast staining.   Patient has waxing and waning symptoms throughout day.   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  Code Stroke  CT head hypodensity at right anterior temporal lobe.   ASPECTS 10.     CTA head & neck:   1. Thrombus in the distal right M1 segment with near complete occlusion. There is reconstitution of flow in superior M2 division but no inferior M2 division is identified. 2. 11 cc  infarct core in the right corona radiata, 75 cc penumbra. CT perfusion : CBF (<30%) Volume: 11mL Perfusion (Tmax>6.0s) volume: 86mL Mismatch Volume: 75mL Infarction Location:Right corona radiata Cerebral angio : report from Dr. Corliss Skainseveshwar  bilateral common carotid and Lt VA angiograms followed by complete revascularization of occluded LT MCA M1 seg with x pass with contact aspiration and x1 pass with solitaire 3mm x 40 mm retriever and contact aspiration achieving a TICI 3 revascularization. Post CT brain No ICH . Hypercoagulable workup pending   MRI BRAIN:  1. Acute anterior right MCA territory infarct, predominantly involving the right caudate, putamen, anterior limb of the right internal capsule, insula, operculum and overlying right frontal lobe. A few punctate acute infarcts in the right parietal lobe. 2. Associated cytotoxic edema with slight (2-3 mm) leftward midline shift at the foramen of Monro. 3. Probable small amount of hemorrhage in the posterior right frontal lobe/insular region. 2D Echo LVEF 60-65%, normal LA, poorly visualized interatrial septum  LDL 117 HgbA1c 5.0 VTE prophylaxis - scd    Diet   Diet NPO time specified  No antithrombotic prior to admission, now on No antithrombotic.   Therapy recommendations:   pending Disposition:  likely home  Hyperlipidemia Home meds:   none,    LDL 117, goal < 70 Add  lipitor 80mg  daily   High intensity statin   Continue statin at discharge    HgbA1c 5.0, goal < 7.0 CBGs Recent Labs    10/11/20 0935 10/12/20 2104 10/13/20 0313  GLUCAP 106* 106* 129*     SSI  Other Stroke Risk Factors  Birth control use   Substance abuse - UDS:  THC POSITIVE, Cocaine NONE DETECTED. Patient advised to stop using due to stroke risk.  Obesity, Body mass index is 30.83 kg/m., BMI >/= 30 associated with increased stroke risk, recommend weight loss, diet and exercise as appropriate   Family hx stroke (grandmother, multiple  aunts)   Hospital day # 2 This patient is critically ill due to stroke post procedure and at significant risk of neurological worsening, death form severe anemia, bleeding, recurrent stroke, intracranial stenosis. This patient's care requires constant monitoring of vital signs, hemodynamics, respiratory and cardiac monitoring, review of multiple databases, neurological assessment, other specialists and medical decision making of high complexity. I spent 45 minutes of neurocritical care time in the care of this patient       To contact Stroke Continuity provider, please refer to 12/13/20. After hours, contact General Neurology

## 2020-10-13 NOTE — Evaluation (Signed)
Clinical/Bedside Swallow Evaluation Patient Details  Name: Kristen Grant MRN: [id number] Date of Birth: 04/21/1995  Today's Date: 10/13/2020 Time: SLP Start Time (ACUTE ONLY): 1522 SLP Stop Time (ACUTE ONLY): 1539 SLP Time Calculation (min) (ACUTE ONLY): 17 min  Past Medical History:  Past Medical History:  Diagnosis Date   Anxiety    Asthma    no treatments required at this time   Migraine headache    Past Surgical History:  Past Surgical History:  Procedure Laterality Date   NO PAST SURGERIES     HPI:  Pt is a 25 y/o female who presented 9/9 with c/o L sided weakness and facial droop. TNK administered. CT head 9/9: Hypodensity in the right anterior temporal lobe is concerning for infarct given hyperdense vessel sign on the right. Pt s/p L MCA mechanical thrombectomy with revascularization on 9/9. On 9/10 pt had L hemiplegia, right gaze, and slurred speech. Neuro contacted and sent for repeat CTA which showed: proximal right M2 occlusion; pt returned to IR for repeat thrombectomy. Pt passed [facility] on 9/9 and 9/11, but SLP consulted as a precaution. PMH includes: anxiety, migraines, birth control use, THC (+)   Assessment / Plan / Recommendation Clinical Impression  Pt was seen for bedside swallow evaluation with her sister and mother present; all parties denied the pt having a history of dysphagia. Oral mechanism exam was significant for reduced left-sided facial and lingual strength and ROM. Dentition was adequate. Throat clearing was noted at baseline and pt reported that she has been doing this since extubation. Pt demonstrated inconsistent throat clearing with thin liquids and regular texture solids. Mild residue was noted in the left lateral sulcus and improved with cues lingual sweeps. No symptoms of pharyngeal residue was noted. A regular texture diet with thin liquids is recommended at this time. SLP will follow to assess tolerance and for instrumental assessment if clinically  indicated. SLP Visit Diagnosis: Dysphagia, unspecified (R13.10)    Aspiration Risk  Mild aspiration risk    Diet Recommendation Regular;Thin liquid   Liquid Administration via: Cup;Straw Medication Administration: Whole meds with puree Supervision: Staff to assist with self feeding Compensations: Slow rate;Small sips/bites Postural Changes: Seated upright at 90 degrees    Other  Recommendations Oral Care Recommendations: Oral care BID   Follow up Recommendations  (TBD)      Frequency and Duration min 1 x/week  1 week       Prognosis Prognosis for Safe Diet Advancement: Good      Swallow Study   General Date of Onset: 10/12/20 HPI: Pt is a 25 y/o female who presented 9/9 with c/o L sided weakness and facial droop. TNK administered. CT head 9/9: Hypodensity in the right anterior temporal lobe is concerning for infarct given hyperdense vessel sign on the right. Pt s/p L MCA mechanical thrombectomy with revascularization on 9/9. On 9/10 pt had L hemiplegia, right gaze, and slurred speech. Neuro contacted and sent for repeat CTA which showed: proximal right M2 occlusion; pt returned to IR for repeat thrombectomy. Pt passed [facility] on 9/9 and 9/11, but SLP consulted as a precaution. PMH includes: anxiety, migraines, birth control use, THC (+) Type of Study: Bedside Swallow Evaluation Previous Swallow Assessment: none Diet Prior to this Study: Regular;Thin liquids Temperature Spikes Noted: No Respiratory Status: Room air History of Recent Intubation: Yes Length of Intubations (days):  (for procedure) Date extubated: 10/13/20 Behavior/Cognition: Alert;Cooperative;Pleasant mood Oral Cavity Assessment: Within Functional Limits Oral Care Completed by SLP: No Oral Cavity -  Dentition: Adequate natural dentition Vision: Functional for self-feeding Self-Feeding Abilities: Able to feed self Patient Positioning: Upright in bed;Postural control adequate for testing Baseline Vocal Quality:  Normal Volitional Cough: Strong Volitional Swallow: Able to elicit    Oral/Motor/Sensory Function Overall Oral Motor/Sensory Function: Moderate impairment Facial ROM: Reduced left;Suspected CN VII (facial) dysfunction Facial Symmetry: Abnormal symmetry left;Suspected CN VII (facial) dysfunction Facial Strength: Reduced left;Suspected CN VII (facial) dysfunction Facial Sensation: Reduced left Lingual ROM: Reduced left Lingual Symmetry: Within Functional Limits Lingual Strength: Reduced;Suspected CN XII (hypoglossal) dysfunction Lingual Sensation: Within Functional Limits Velum: Within Functional Limits Mandible: Within Functional Limits   Ice Chips Ice chips: Within functional limits Presentation: Spoon   Thin Liquid Thin Liquid: Impaired Presentation: Straw;Cup Pharyngeal  Phase Impairments: Throat Clearing - Immediate (inconsistent)    Nectar Thick Nectar Thick Liquid: Not tested   Honey Thick Honey Thick Liquid: Not tested   Puree Puree: Within functional limits Presentation: Spoon   Solid     Solid: Impaired Presentation: Self Fed Oral Phase Impairments: Reduced labial seal Oral Phase Functional Implications: Oral residue (Left lateral sulcus) Pharyngeal Phase Impairments: Throat Clearing - Delayed     Kristen Hannibal I. Vear Clock, MS, CCC-SLP Acute Rehabilitation Services Office number [phone] Pager [phone]  Kristen Grant 10/13/2020,3:53 PM

## 2020-10-13 NOTE — Plan of Care (Signed)

## 2020-10-13 NOTE — Progress Notes (Signed)
S/P thrombectomy - A x 3; VFF; R side 5/5; LUE 3/5 and LLE 2-3/5. L lower facial plegia; HEAD CT reviewed; MRI today; speech before advancing diet.

## 2020-10-13 NOTE — Progress Notes (Signed)
NAME:  Kristen Grant, MRN:  [id number], DOB:  Jun 24, 1995, LOS: 2 ADMISSION DATE:  10/11/2020, CONSULTATION DATE:  10/12/2020 REFERRING MD:  Dr. Gerilyn Pilgrim, CHIEF COMPLAINT:  L M2 occlusion s/p thrombectomy   History of Present Illness:  Patient is a 25 yo F w/ pertinent PMH of asthma and migraine HA present to [facility] on 9/9 with L sided weakness (code stroke).  Patient suddenly became weak on left side soon after waking up on 9/9. LKN was 0815 am 9/9.  Partner noticed L sided facial drooping/drooling and her body was weak. EMS arrived and noticed slight R gaze deviation, left facial droop, dysarthria, and inability to move LUE/LLE. Patient complained of R sided HA.  Upon arrival 9/[facility] to [facility] ED, CT head showed R M1 and M2 segments w/ hypodensity in R temporal lobe. Patient risk factors are use of estrogenic progestogen birth control use and vaping. TNK was administered. Hypercoagulable panel ordered. Patient went to IR for b/l common carotid and Lt VA angiograms followed by complete revascularization of occluded LT MCA M1.   On 9/10 3:05 in the afternoon, patient had L hemiplegia, right gaze, and slurred speech. Neuro contacted and sent for repeat CTA which showed: showed proximal right M2 occlusion. Patient taken to IR for thrombectomy. Patient taken back to ICU extubated and on cleviprex.  PCCM consulted for icu medical management.      Significant Hospital Events: Including procedures, antibiotic start and stop dates in addition to other pertinent events   9/9: admitted to [facility] for R m1 and m2 occlusion 9/10: patient had another stroke and taken to IR for thrombectomy. On cleviprex  Interim History / Subjective:  Patient remains off Cleviprex and Levophed, able to maintain blood pressure between 120-140 MRI brain is pending  Objective   Blood pressure 111/63, pulse 80, temperature 98.6 F (37 C), temperature source Oral, resp. rate 10, height 5\' 9"  (1.753 m), weight 94.7 kg, SpO2 97 %.         Intake/Output Summary (Last 24 hours) at 10/13/2020 0823 Last data filed at 10/13/2020 0600 Gross per 24 hour  Intake 2227.11 ml  Output 3750 ml  Net -1522.89 ml    Filed Weights   10/11/20 0900 10/13/20 0600  Weight: 94.6 kg 94.7 kg    Examination: General: Young morbidly obese Caucasian female, lying on the bed HEENT: Atraumatic, normocephalic, moist mucous membranes, no JVD Neuro: Alert, awake, following simple commands.  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, s/p thrombectomy on 9/10 Continue neuro watch every hour MRI brain is pending Neurology is following Continue secondary stroke prophylaxis, currently patient is not on antiplatelet or atorvastatin, defer to neurology when to start Stroke work-up is pending SBP goal 120-140 Continue verapamil A1c is 5 PT/OT/SLP SCDs in place  Obesity Dietitian follow-up  Asthma, not in exacerbation Continue prn albuterol  Best Practice (right click and "Reselect all SmartList Selections" daily)   Diet/type: NPO, speech and swallow evaluation is pending DVT prophylaxis: SCD GI prophylaxis: PPI Lines: Arterial Line Foley:  Yes, and it is still needed Code Status:  full code Last date of multidisciplinary goals of care discussion [per primary team]  Labs   CBC: Recent Labs  Lab 10/11/20 0936 10/11/20 0943 10/12/20 0018 10/13/20 0308  WBC 6.3  --  8.5 13.4*  NEUTROABS 3.0  --  7.4 11.2*  HGB 13.5 12.9 13.2 11.6*  HCT 40.4 38.0 39.3 34.4*  MCV 89.0  --  86.9 89.4  PLT 283  --  304 290      Basic Metabolic Panel: Recent Labs  Lab 10/11/20 0936 10/11/20 0943 10/12/20 0018 10/13/20 0308  NA 138 141 137 140  K 4.2 4.2 3.5 3.6  CL 110 109 107 112*  CO2 24  --  19* 21*  GLUCOSE 101* 100* 156* 139*  BUN 7 9 6 6   CREATININE 0.56 0.50 0.50 0.45  CALCIUM 8.8*  --  8.9 8.4*  MG  --   --   --  1.7    GFR: Estimated Creatinine Clearance: 131.7 mL/min (by C-G formula based on SCr of 0.45 mg/dL). Recent Labs  Lab 10/11/20 0936 10/12/20 0018 10/13/20 0308  WBC 6.3 8.5 13.4*     Liver Function Tests: Recent Labs  Lab 10/11/20 0936 10/13/20 0308  AST 20 14*  ALT 13 14  ALKPHOS 44 37*  BILITOT 0.8 1.3*  PROT 6.0* 5.7*  ALBUMIN 3.8 3.3*    No results for input(s): LIPASE, AMYLASE in the last 168 hours. No results for input(s): AMMONIA in the last 168 hours.  ABG    Component Value Date/Time   TCO2 25 10/11/2020 0943      Coagulation Profile: Recent Labs  Lab 10/11/20 0936  INR 1.0     Cardiac Enzymes: No results for input(s): CKTOTAL, CKMB, CKMBINDEX, TROPONINI in the last 168 hours.  HbA1C: Hgb A1c MFr Bld  Date/Time Value Ref Range Status  10/12/2020 12:18 AM 5.0 4.8 - 5.6 % Final    Comment:    (NOTE) Pre diabetes:          5.7%-6.4%  Diabetes:              >6.4%  Glycemic control for   <7.0% adults with diabetes     CBG: Recent Labs  Lab 10/11/20 0935 10/12/20 2104 10/13/20 0313  GLUCAP 106* 106* 129*    Total critical care time: 37 minutes  Performed by: 12/13/20   Critical care time was exclusive of separately billable procedures and treating other patients.   Critical care was necessary to treat or prevent imminent or life-threatening deterioration.   Critical care was time spent personally by me on the following activities: development of treatment plan with patient and/or surrogate as well as nursing, discussions with consultants, evaluation of patient's response to treatment, examination of patient,  obtaining history from patient or surrogate, ordering and performing treatments and interventions, ordering and review of laboratory studies, ordering and review of radiographic studies, pulse oximetry and re-evaluation of patient's condition.   Cheri Fowler MD [facility] See Amion for pager If no response to pager, please call [phone] until 7pm After 7pm, Please call E-link [phone]

## 2020-10-13 NOTE — Progress Notes (Signed)
OT Cancellation Note  Patient Details Name: Kristen Grant MRN: [id number] DOB: [birth year]   Cancelled Treatment:    Reason Eval/Treat Not Completed: Medical issues which prohibited therapy. Pt still on strict bedrest--awaiting neurology clearance to mobilize patient.  Ignacia Palma, OTR/L Acute Rehab Services Pager [phone] Office [phone]    Evette Georges 10/13/2020, 8:16 AM

## 2020-10-13 NOTE — Plan of Care (Signed)
S/p  redo-thrombectomy R MCA, R groin approach, bedrest completed, site tender but no complications.  Aline for BP monitoring with goal SBP 120-140. Initially on cleviprex gtt, weaned of quickly on return from IR, required low dose levo gtt (max 2 mcg/min) to maintain BP goal, Leo weaned off this am. NSR 70s-80s, Sats stable on RA.   Alert, oriented, frequent neuro assessments/mNIHSS     L facial droop    L weakness, LUE/LLE able to lift slightly off bed, stronger at times (may be complicated by fatigue/being woken up q 1 hr for assessments)     No visual or sensory deficits. Speech clear.   Headache tx with morphine IV and environmental changes.   Repeat Head CT done.   Problem: Education: Goal: Knowledge of General Education information will improve Description: Including pain rating scale, medication(s)/side effects and non-pharmacologic comfort measures Outcome: Progressing   Problem: Clinical Measurements: Goal: Ability to maintain clinical measurements within normal limits will improve Outcome: Progressing Goal: Will remain free from infection Outcome: Progressing Goal: Respiratory complications will improve Outcome: Progressing Goal: Cardiovascular complication will be avoided Outcome: Progressing   Problem: Coping: Goal: Level of anxiety will decrease Outcome: Progressing   Problem: Safety: Goal: Ability to remain free from injury will improve Outcome: Progressing   Problem: Skin Integrity: Goal: Risk for impaired skin integrity will decrease Outcome: Progressing   Problem: Education: Goal: Knowledge of disease or condition will improve Outcome: Progressing   Problem: Coping: Goal: Will verbalize positive feelings about self Outcome: Progressing   Problem: Ischemic Stroke/TIA Tissue Perfusion: Goal: Complications of ischemic stroke/TIA will be minimized Outcome: Progressing

## 2020-10-13 NOTE — Progress Notes (Signed)
   10/13/20 1930  Glasgow Coma Scale  Eye Opening 4  Best Verbal Response (NON-intubated) 5  Best Motor Response 6  Glasgow Coma Scale Score 15  Richmond Agitation Sedation Scale  Richmond Agitation Sedation Scale (RASS) 0  RASS Goal 0  NIH Stroke Scale ( + Modified Stroke Scale Criteria)   Interval Other (Comment) (Hand off at bedside)  Level of Consciousness (1a.)    0  LOC Questions (1b. )   + 0  LOC Commands (1c. )   +  0  Best Gaze (2. )  + 0  Visual (3. )  + 0  Facial Palsy (4. )     1  Motor Arm, Left (5a. )   + 1  Motor Arm, Right (5b. )   + 0  Motor Leg, Left (6a. )   + 2  Motor Leg, Right (6b. )   + 2  Limb Ataxia (7. ) 0  Sensory (8. )   + 0  Best Language (9. )   + 0  Dysarthria (10. ) 0  Extinction/Inattention (11.)   + 0  Modified SS Total  + 5  Complete NIHSS TOTAL 6  Cardiac  Cardiac Rhythm NSR;Heart block  Telemetry Box Number [id number]  Tele Box Verification Completed by Second Verifier Completed   Hand off report done at bedside.

## 2020-10-13 NOTE — Progress Notes (Signed)
PT Cancellation Note  Patient Details Name: Kristen Grant MRN: [id number] DOB: 1995/02/28   Cancelled Treatment:    Reason Eval/Treat Not Completed: Patient not medically ready (await neuro clearance for mobility progression)   Kristen Grant 10/13/2020, 7:48 AM Merryl Hacker, PT Acute Rehabilitation Services Pager: [phone] Office: [phone]

## 2020-10-13 NOTE — Progress Notes (Signed)
Referring Physician(s): Dr Agnes LawrenceE Lindzen  Supervising Physician: Julieanne Cottoneveshwar, Sanjeev  [facility]  Chief Complaint:  CVA R MCA revascularization in IR 9/9 Repeat angioplasty 9/10  Subjective:  R MCA revascularization 9/9 in IR 9/10 pt was doing well-- moving all 4s Great strength B Neuro intact At 300 pm yesterday- developed sxs of left side weakness; slurred speech; tachy  Evaluation by Neuro CTA revealing R M2 occlusion Taken to IR for repeat R MCA revascularization MCA x1 pass with 4 mmc 40 mm solitaireX retriever and contact aspiration achieving a TICI2B -2C revascularizatiopn.   Pt is lying in bed Soft spoken Does know place and name Mother at bedside    Allergies: Patient has no known allergies.  Medications: Prior to Admission medications   Medication Sig Start Date End Date Taking? Authorizing Provider  medroxyPROGESTERone (DEPO-PROVERA) 150 MG/ML injection Inject 1 mL (150 mg total) into the muscle every 3 (three) months. 12/06/19 10/12/20 Yes Larene PickettLatta, Cynthia, FNP  norethindrone (MICRONOR) 0.35 MG tablet Take 1 tablet (0.35 mg total) by mouth daily for 28 days. Patient not taking: Reported on 10/11/2020 10/06/19 11/03/19  Alberteen SpindleSciora, Elizabeth A, CNM     Vital Signs: BP 107/69   Pulse 80   Temp 98.6 F (37 C) (Oral)   Resp 10   Ht 5\' 9"  (1.753 m)   Wt 208 lb 12.4 oz (94.7 kg)   SpO2 98%   BMI 30.83 kg/m   Physical Exam Constitutional:      Comments: Face is symmetrical Tongue is midline  HENT:     Mouth/Throat:     Mouth: Mucous membranes are moist.  Eyes:     Extraocular Movements: Extraocular movements intact.  Pulmonary:     Breath sounds: Rales: CVA.  Musculoskeletal:     Comments: Moving right well Good strength and sensation  Can lift left arm off bed minimally Left hand is in fist Unable to hold fingers straight for any length Can bend left knee-- can left foot off bed; and leg off bed to some degree  Rt groin soft -  no bleeding; no hematoma Rt foot 2 + pulses  Neurological:     Mental Status: She is alert.  Psychiatric:     Comments: Answers all of Dr Reginia Fortseveshwars questions appropriately    Imaging: CT HEAD WO CONTRAST (5MM)  Result Date: 10/12/2020 CLINICAL DATA:  Follow-up examination for acute stroke, status post thrombectomy. EXAM: CT HEAD WITHOUT CONTRAST TECHNIQUE: Contiguous axial images were obtained from the base of the skull through the vertex without intravenous contrast. COMPARISON:  Prior CTA from earlier the same day. FINDINGS: Brain: Loss of gray-white matter differentiation involving the right insular region as well as the overlying right frontal operculum. Fogging at the right caudate and lentiform nuclei present as well. Findings consistent with evolving right MCA distribution infarct. Superimposed hyperdensity at the level of the right sylvian fissure could reflect contrast staining or possibly small amount of subarachnoid hemorrhage (series 2, image 20). No significant mass effect at this time. Remainder the brain is otherwise normal in appearance. No other hemorrhage or acute large vessel territory infarct. No mass lesion or midline shift. No hydrocephalus. No extra-axial fluid collection. Vascular: No visible hyperdense vessel. Skull: Scalp soft tissues and calvarium demonstrate no new abnormality. Sinuses/Orbits: Globes and orbital soft tissues within normal limits. Scattered mucosal thickening in opacity present within the sphenoid ethmoidal and maxillary sinuses with a few scattered retention cyst. Mastoid air cells are clear.  Other: None. IMPRESSION: 1. Evolving acute right MCA distribution infarct. Superimposed hyperdensity at the level of the right Sylvian fissure could reflect contrast staining or possibly small amount of subarachnoid hemorrhage. Short interval follow-up CT suggested. No significant mass effect at this time. 2. No other new acute intracranial abnormality. These results were  communicated to Dr. Wilford Corner at 8:55 pm on 10/12/2020 by text page via the [facility] messaging system. Electronically Signed   By: Rise Mu M.D.   On: 10/12/2020 20:56   ECHOCARDIOGRAM COMPLETE  Result Date: 10/11/2020    ECHOCARDIOGRAM REPORT   Patient Name:   Kristen Grant Date of Exam: 10/11/2020 Medical Rec #:  [id number]      Height:       67.0 in Accession #:    [id number]     Weight:       208.6 lb Date of Birth:  1995-02-16       BSA:          2.059 m Patient Age:    25 years       BP:           124/76 mmHg Patient Gender: F              HR:           76 bpm. Exam Location:  Inpatient Procedure: 2D Echo Indications:    stroke  History:        Patient has no prior history of Echocardiogram examinations.  Sonographer:    Delcie Roch RDCS Referring Phys: [phone] ERIC LINDZEN IMPRESSIONS  1. Left ventricular ejection fraction, by estimation, is 60 to 65%. The left ventricle has normal function. The left ventricle has no regional wall motion abnormalities. Left ventricular diastolic parameters were normal.  2. Right ventricular systolic function is normal. The right ventricular size is normal. Tricuspid regurgitation signal is inadequate for assessing PA pressure.  3. The mitral valve is normal in structure. No evidence of mitral valve regurgitation. No evidence of mitral stenosis.  4. The aortic valve was not well visualized. Aortic valve regurgitation is not visualized. No aortic stenosis is present. Conclusion(s)/Recommendation(s): No intracardiac source of embolism detected on this transthoracic study. A transesophageal echocardiogram is recommended to exclude cardiac source of embolism if clinically indicated. FINDINGS  Left Ventricle: Left ventricular ejection fraction, by estimation, is 60 to 65%. The left ventricle has normal function. The left ventricle has no regional wall motion abnormalities. The left ventricular internal cavity size was normal in size. There is  no left ventricular  hypertrophy. Left ventricular diastolic parameters were normal. Right Ventricle: The right ventricular size is normal. No increase in right ventricular wall thickness. Right ventricular systolic function is normal. Tricuspid regurgitation signal is inadequate for assessing PA pressure. Left Atrium: Left atrial size was normal in size. Right Atrium: Right atrial size was normal in size. Pericardium: There is no evidence of pericardial effusion. Mitral Valve: The mitral valve is normal in structure. No evidence of mitral valve regurgitation. No evidence of mitral valve stenosis. Tricuspid Valve: The tricuspid valve is normal in structure. Tricuspid valve regurgitation is trivial. Aortic Valve: The aortic valve was not well visualized. Aortic valve regurgitation is not visualized. No aortic stenosis is present. Pulmonic Valve: The pulmonic valve was not well visualized. Pulmonic valve regurgitation is not visualized. Aorta: The aortic root and ascending aorta are structurally normal, with no evidence of dilitation. IAS/Shunts: The interatrial septum was not well visualized.  LEFT VENTRICLE PLAX 2D LVIDd:  4.50 cm  Diastology LVIDs:         2.90 cm  LV e' medial:    10.60 cm/s LV PW:         0.90 cm  LV E/e' medial:  7.6 LV IVS:        0.80 cm  LV e' lateral:   15.90 cm/s LVOT diam:     1.90 cm  LV E/e' lateral: 5.0 LV SV:         62 LV SV Index:   30 LVOT Area:     2.84 cm  RIGHT VENTRICLE             IVC RV S prime:     12.90 cm/s  IVC diam: 1.10 cm TAPSE (M-mode): 2.3 cm LEFT ATRIUM             Index       RIGHT ATRIUM          Index LA diam:        3.20 cm 1.55 cm/m  RA Area:     9.88 cm LA Vol (A2C):   31.7 ml 15.40 ml/m RA Volume:   19.60 ml 9.52 ml/m LA Vol (A4C):   22.5 ml 10.93 ml/m LA Biplane Vol: 26.8 ml 13.02 ml/m  AORTIC VALVE LVOT Vmax:   126.00 cm/s LVOT Vmean:  81.500 cm/s LVOT VTI:    0.217 m  AORTA Ao Root diam: 2.80 cm Ao Asc diam:  2.60 cm MITRAL VALVE MV Area (PHT): 4.49 cm    SHUNTS  MV Decel Time: 169 msec    Systemic VTI:  0.22 m MV E velocity: 80.10 cm/s  Systemic Diam: 1.90 cm MV A velocity: 51.80 cm/s MV E/A ratio:  1.55 Epifanio Lesches MD Electronically signed by Epifanio Lesches MD Signature Date/Time: 10/11/2020/10:54:58 PM    Final    CT HEAD CODE STROKE WO CONTRAST  Result Date: 10/11/2020 CLINICAL DATA:  Code stroke.  Weakness EXAM: CT HEAD WITHOUT CONTRAST TECHNIQUE: Contiguous axial images were obtained from the base of the skull through the vertex without intravenous contrast. COMPARISON:  None. FINDINGS: Brain: There is hypodensity in the right temporal lobe. The remainder of the brain parenchyma is normal in appearance. There is no evidence of acute intracranial hemorrhage. There is no mass lesion. There is no midline shift. Vascular: There is a hyperdense vessel sign on the right. Skull: Normal. Negative for fracture or focal lesion. Sinuses/Orbits: There are mucous retention cysts in the bilateral maxillary and right sphenoid sinuses. The globes and orbits are unremarkable. Other: None. ASPECTS [facility] Stroke Program Early CT Score) - Ganglionic level infarction (caudate, lentiform nuclei, internal capsule, insula, M1-M3 cortex): 3 - Supraganglionic infarction (M4-M6 cortex): 7 Total score (0-10 with 10 being normal): 10 IMPRESSION: 1. Hypodensity in the right anterior temporal lobe is concerning for infarct given hyperdense vessel sign on the right. Vascular imaging has already been performed at the time of dictation. 2. ASPECTS is 10 These results were called by telephone at the time of interpretation on 10/11/2020 at 9:55 am to provider DR Otelia Limes, who verbally acknowledged these results. Electronically Signed   By: Lesia Hausen M.D.   On: 10/11/2020 09:56   CT ANGIO HEAD NECK W WO CM W PERF (CODE STROKE)  Result Date: 10/12/2020 CLINICAL DATA:  Neuro deficit, acute, stroke suspected EXAM: CT ANGIOGRAPHY HEAD AND NECK CT PERFUSION BRAIN TECHNIQUE: Multidetector  CT imaging of the head and neck was performed using the standard protocol during  bolus administration of intravenous contrast. Multiplanar CT image reconstructions and MIPs were obtained to evaluate the vascular anatomy. Carotid stenosis measurements (when applicable) are obtained utilizing NASCET criteria, using the distal internal carotid diameter as the denominator. Multiphase CT imaging of the brain was performed following IV bolus contrast injection. Subsequent parametric perfusion maps were calculated using RAPID software. CONTRAST:  OMNIPAQUE IOHEXOL 350 MG/ML SOLN COMPARISON:  10/11/2020. FINDINGS: CT HEAD FINDINGS Brain: Hypoattenuation and loss of gray-white differentiation in the right MCA territory, including the right frontal lobe, right insula, internal capsule right lentiform nucleus, caudate, and inferior right frontal cortex/operculum. Slight midline shift. No acute hemorrhage, hydrocephalus, mass lesion, or visible extra-axial fluid collection. ASPECTS [facility] Stroke Program Early CT Score) - Ganglionic level infarction (caudate, lentiform nuclei, internal capsule, insula, M1-M3 cortex): 1 - Supraganglionic infarction (M4-M6 cortex): 3 Total score (0-10 with 10 being normal): 4 Vascular: See below. Skull: No acute fracture. Sinuses: No acute findings. Orbits: No acute findings. Review of the MIP images confirms the above findings CTA NECK FINDINGS Aortic arch: Great vessel origins are patent. Right carotid system: No evidence of dissection, stenosis (50% or greater) or occlusion. Left carotid system: No evidence of dissection, stenosis (50% or greater) or occlusion. Vertebral arteries: Mildly left dominant. No significant (greater than 50%) stenosis. Skeleton: No acute abnormality. Other neck: No acute abnormality. CTA HEAD FINDINGS Anterior circulation: Bilateral ICAs and M1 MCAs are patent. Occlusion a of an anterior right M2 MCA branch (see series 11, images 237 through 240). Bilateral  ACAs are patent. Posterior circulation: Bilateral intradural vertebral arteries basilar artery and posterior cerebral arteries are patent without proximal high-grade stenosis. Venous: No evidence of dural sinus thrombosis. CT Brain Perfusion Findings: ASPECTS: 4 CBF (<30%) Volume: 50mL Perfusion (Tmax>6.0s) volume: 50mL Mismatch Volume: 64mL Infarction Location:Right MCA territory IMPRESSION: CT Head: 1. 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 ASPECTS is 4. Slight leftward midline shift. 2. No acute hemorrhage. CTA: 1. Proximal right M2 MCA occlusion. 2. No significant stenosis in the neck. CT Perfusion: Large area of core infarct in the right MCA territory, measuring at least 55 mL and likely underestimated given superior extent is not imaged. Small (8 mL) area of ischemic penumbra. Findings discussed with Dr. Otelia Limes via telephone at 4:00 PM Electronically Signed   By: Feliberto Harts M.D.   On: 10/12/2020 16:26   CT ANGIO HEAD NECK W WO CM W PERF (CODE STROKE)  Result Date: 10/11/2020 EXAM: CT ANGIOGRAPHY HEAD AND NECK CT PERFUSION BRAIN TECHNIQUE: Multidetector CT imaging of the head and neck was performed using the standard protocol during bolus administration of intravenous contrast. Multiplanar CT image reconstructions and MIPs were obtained to evaluate the vascular anatomy. Carotid stenosis measurements (when applicable) are obtained utilizing NASCET criteria, using the distal internal carotid diameter as the denominator. Multiphase CT imaging of the brain was performed following IV bolus contrast injection. Subsequent parametric perfusion maps were calculated using RAPID software. CONTRAST:  OMNIPAQUE IOHEXOL 350 MG/ML SOLN COMPARISON:  Same-day noncontrast CT head FINDINGS: CTA NECK FINDINGS Aortic arch: Standard branching. Imaged portion shows no evidence of aneurysm or  dissection. No significant stenosis of the major arch vessel origins. Right carotid system: No evidence of dissection, stenosis (50% or greater) or occlusion. Left carotid system: No evidence of dissection, stenosis (50% or greater) or occlusion. Vertebral arteries: The left vertebral artery is dominant, a normal variant.  The vertebral arteries are patent, without hemodynamically significant stenosis, occlusion, or dissection. Skeleton: The bones are normal. Other neck: The soft tissues are normal. Upper chest: The lung apices are clear. Review of the MIP images confirms the above findings CTA HEAD FINDINGS Anterior circulation: The bilateral intracranial ICAs are patent. There is thrombus in the distal right M1 segment with near complete occlusion. There is reconstitution of flow in the superior M2 division but no inferior M2 division is identified. The left MCA and bilateral ACAs are patent. There is no aneurysm. Posterior circulation: The V4 segments of the vertebral arteries are patent. The basilar artery is patent. The bilateral PCAs are patent. Venous sinuses: Not evaluated due to contrast timing. Anatomic variants: None. Review of the MIP images confirms the above findings CT Brain Perfusion Findings: CBF (<30%) Volume: 21mL Perfusion (Tmax>6.0s) volume: 16mL Mismatch Volume: 43mL Infarction Location:Right corona radiata IMPRESSION: 1. Thrombus in the distal right M1 segment with near complete occlusion. There is reconstitution of flow in superior M2 division but no inferior M2 division is identified. 2. 11 cc infarct core in the right corona radiata, 75 cc penumbra. These results were called by telephone at the time of interpretation on 10/11/2020 at 9:58 am to provider Dr Otelia Limes, who verbally acknowledged these results. Electronically Signed   By: Lesia Hausen M.D.   On: 10/11/2020 10:10    Labs:  CBC: Recent Labs    10/11/20 0936 10/11/20 0943 10/12/20 0018 10/13/20 0308  WBC 6.3  --  8.5 13.4*   HGB 13.5 12.9 13.2 11.6*  HCT 40.4 38.0 39.3 34.4*  PLT 283  --  304 290    COAGS: Recent Labs    10/11/20 0936  INR 1.0  APTT 27    BMP: Recent Labs    10/11/20 0936 10/11/20 0943 10/12/20 0018 10/13/20 0308  NA 138 141 137 140  K 4.2 4.2 3.5 3.6  CL 110 109 107 112*  CO2 24  --  19* 21*  GLUCOSE 101* 100* 156* 139*  BUN 7 9 6 6   CALCIUM 8.8*  --  8.9 8.4*  CREATININE 0.56 0.50 0.50 0.45  GFRNONAA >60  --  >60 >60    LIVER FUNCTION TESTS: Recent Labs    10/11/20 0936 10/13/20 0308  BILITOT 0.8 1.3*  AST 20 14*  ALT 13 14  ALKPHOS 44 37*  PROT 6.0* 5.7*  ALBUMIN 3.8 3.3*    Assessment and Plan:  CVA R MCA revascularization 9/9 Reocclusion--- repeat IR procedure 9/10 pm Plan per Neuro Will need PT/OT evaluation  Electronically Signed: 11/9, PA-C 10/13/2020, 9:08 AM   I spent a total of 15 Minutes at the the patient's bedside AND on the patient's hospital floor or unit, greater than 50% of which was counseling/coordinating care for CVA/R MCA revascularization

## 2020-10-13 NOTE — Progress Notes (Signed)
Inpatient Rehab Admissions Coordinator Note:   Per PT patient was screened for CIR candidacy by Jose Corvin Luvenia Starch, CCC-SLP. At this time, pt appears to be a potential candidate for CIR. I will place an order for rehab consult for full assessment, per our protocol.  Please contact me any with questions.Wolfgang Phoenix, MS, CCC-SLP Admissions Coordinator [phone] 10/13/20 5:01 PM

## 2020-10-13 NOTE — Progress Notes (Signed)
SLP Cancellation Note  Patient Details Name: YONA KOSEK MRN: [id number] DOB: 04-15-1995   Cancelled treatment:       Reason Eval/Treat Not Completed: Patient at procedure or test/unavailable (Pt off unit for MRI. SLP will follow up.)  Nicola Heinemann I. Vear Clock, MS, CCC-SLP Acute Rehabilitation Services Office number [phone] Pager [phone]  Scheryl Marten 10/13/2020, 12:56 PM

## 2020-10-13 NOTE — Progress Notes (Signed)
Sleeping but arousable: EOMI; VFF: LUE 1-2/5; LLE2-3/5. MRI reveiwed in person and discussed with mother. ASA to be started 24 hr after thrombectomy.

## 2020-10-13 NOTE — Evaluation (Signed)
Physical Therapy Re-Evaluation Patient Details Name: Kristen Grant MRN: [id number] DOB: 1995/03/03 Today's Date: 10/13/2020   History of Present Illness  Tht pt is a 25 yo female presenting 9/9 with c/o L sided weakness and facial droop. Pt s/p L MCA mechanical thrombectomy with revascularization on 9/9. 9/10 pt lethargic with left hemiplegia with additional CVA and return to IR for repeat thrombectomy. PMH includes: anxiety, migraines, birth control use, THC (+)  Clinical Impression  Pt pleasant but reports pain in right groin, right hand and HA. Pt with significant weakness LUE and LLE with decreased sensation LLE. Pt with impaired balance, transfers, functional mobility and safety who will benefit from acute therapy to maximize mobility, safety, balance and function to decrease burden of care. Pt with nausea and HA in standing and deferred OOB to chair. Pt positioned in chair position in bed end of session. Mom present and reports she can provide 24hr assist as needed at D/C. Pt with increased left hand flexion tone with rolled washcloth positioned in hand and family encouraged to cut nails for skin integrity. Pt would greatly benefit from CIR.   HR 76 SpO2 98% on RA BP 109/70    Follow Up Recommendations CIR;Supervision for mobility/OOB    Equipment Recommendations  Other (comment) (TBD with progression)    Recommendations for Other Services OT consult;Rehab consult     Precautions / Restrictions Precautions Precautions: Fall Precaution Comments: left hemiparesis, right groin pain Restrictions Weight Bearing Restrictions: No      Mobility  Bed Mobility Overal bed mobility: Needs Assistance Bed Mobility: Rolling;Sidelying to Sit;Sit to Supine Rolling: Mod assist Sidelying to sit: Mod assist;+2 for physical assistance   Sit to supine: Min assist;+2 for safety/equipment   General bed mobility comments: cues and assist to bend right knee and roll toward left. Mod assist to  lift trunk with min assist to clear legs from surface. +2 for safety and lines. Pt with initial posterior left lean sitting EOB able to correct to midline after grossly 3 min with multimodal cues and initial assist of foot rail    Transfers Overall transfer level: Needs assistance   Transfers: Sit to/from Stand Sit to Stand: Mod assist;+2 safety/equipment         General transfer comment: mod assist with left knee blocked and bil UE hooking with assist of pad and belt to rise from bed x 2. Pt able to side step toward left toward [facility] with assist for weight shifting and physical assist to advance LLE. Pt denied OOB to chair after initial nausea and HA with standing and fearful of chair after 2nd CVA prior date  Ambulation/Gait             General Gait Details: unable to attempt this date  Stairs            Wheelchair Mobility    Modified Rankin (Stroke Patients Only) Modified Rankin (Stroke Patients Only) Pre-Morbid Rankin Score: No symptoms Modified Rankin: Severe disability     Balance Overall balance assessment: Needs assistance Sitting-balance support: Single extremity supported;Feet supported Sitting balance-Leahy Scale: Poor Sitting balance - Comments: initial posterior left lean with progression to minguard EOB grossly 8 min Postural control: Left lateral lean;Posterior lean Standing balance support: Bilateral upper extremity supported Standing balance-Leahy Scale: Poor Standing balance comment: left knee blocked with bil UE support  Pertinent Vitals/Pain Faces Pain Scale: Hurts little more Pain Location: rt groin, Rt arm IV site, HA Pain Descriptors / Indicators: Aching;Guarding;Discomfort Pain Intervention(s): Limited activity within patient's tolerance;Monitored during session;Premedicated before session;Repositioned    Home Living Family/patient expects to be discharged to:: Private residence Living  Arrangements: Children;Spouse/significant other Available Help at Discharge: Family;Available 24 hours/day Type of Home: House Home Access: Stairs to enter   [employer] of Steps: 3 Home Layout: One level Home Equipment: None      Prior Function Level of Independence: Independent         Comments: 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 Needs to be on her feet all day at either place. 5 yo son who just started school     Hand Dominance        Extremity/Trunk Assessment   Upper Extremity Assessment Upper Extremity Assessment: RUE deficits/detail;LUE deficits/detail RUE Deficits / Details: pt with decreased fine motor of right hand stating pain from IV site LUE Deficits / Details: pt with 2/5 shoulder flexion and 2-/5 elbow flexion. Pt with flexion of all digits 4/5 with assisted needed to extend LUE Coordination: decreased fine motor;decreased gross motor    Lower Extremity Assessment Lower Extremity Assessment: RLE deficits/detail;LLE deficits/detail RLE Deficits / Details: pt with limited hip and knee flexion actively due to pain in RT groin at sheath site. otherwise grossly functional for sensation and strength LLE Deficits / Details: pt able to sense light touch but reports numbness compared to RLE, knee flexion 2-/5, hip flexion 2-/5, 3/5 ankle dorsiflexion/plantarflexion LLE Coordination: decreased fine motor;decreased gross motor    Cervical / Trunk Assessment Cervical / Trunk Assessment: Other exceptions Cervical / Trunk Exceptions: pt able to perform full ROM with tendency for forward head and left lean  Communication   Communication: No difficulties;Other (comment) (difficulty with enunciating some words)  Cognition Arousal/Alertness: Awake/alert Behavior During Therapy: Flat affect Overall Cognitive Status: Within Functional Limits for tasks assessed                                         General Comments      Exercises     Assessment/Plan    PT Assessment Patient needs continued PT services  PT Problem List Decreased activity tolerance;Decreased balance;Decreased mobility;Decreased strength;Decreased range of motion;Decreased coordination;Decreased knowledge of use of DME;Decreased safety awareness       PT Treatment Interventions DME instruction;Gait training;Stair training;Functional mobility training;Therapeutic activities;Therapeutic exercise;Neuromuscular re-education;Patient/family education;Balance training    PT Goals (Current goals can be found in the Care Plan section)  Acute Rehab PT Goals Patient Stated Goal: be able to walk and go home PT Goal Formulation: With patient/family Time For Goal Achievement: 10/27/20 Potential to Achieve Goals: Fair    Frequency Min 4X/week   Barriers to discharge        Co-evaluation               AM-PAC PT "6 Clicks" Mobility  Outcome Measure Help needed turning from your back to your side while in a flat bed without using bedrails?: A Lot Help needed moving from lying on your back to sitting on the side of a flat bed without using bedrails?: Total Help needed moving to and from a bed to a chair (including a wheelchair)?: Total Help needed standing up from a chair using your arms (e.g., wheelchair  or bedside chair)?: Total Help needed to walk in hospital room?: Total Help needed climbing 3-5 steps with a railing? : Total 6 Click Score: 7    End of Session Equipment Utilized During Treatment: Gait belt Activity Tolerance: Patient tolerated treatment well Patient left: in bed;with call bell/phone within reach;with family/visitor present;with nursing/sitter in room Nurse Communication: Mobility status PT Visit Diagnosis: Other abnormalities of gait and mobility (R26.89);Difficulty in walking, not elsewhere classified (R26.2);Other symptoms and signs involving the nervous system (R29.898);Hemiplegia and  hemiparesis Hemiplegia - Right/Left: Left Hemiplegia - dominant/non-dominant: Non-dominant Hemiplegia - caused by: Cerebral infarction    Time: 6712-4580 PT Time Calculation (min) (ACUTE ONLY): 33 min   Charges:   PT Evaluation $PT Re-evaluation: 1 Re-eval PT Treatments $Therapeutic Activity: 8-22 mins        Amando Chaput P, PT Acute Rehabilitation Services Pager: [phone] Office: [phone]   Jerilyn Gillaspie B Adalis Gatti 10/13/2020, 10:23 AM

## 2020-10-14 ENCOUNTER — Encounter (HOSPITAL_COMMUNITY): Payer: Self-pay | Admitting: Radiology

## 2020-10-14 ENCOUNTER — Inpatient Hospital Stay (HOSPITAL_COMMUNITY): Payer: Managed Care, Other (non HMO)

## 2020-10-14 DIAGNOSIS — I639 Cerebral infarction, unspecified: Secondary | ICD-10-CM

## 2020-10-14 DIAGNOSIS — I63511 Cerebral infarction due to unspecified occlusion or stenosis of right middle cerebral artery: Secondary | ICD-10-CM

## 2020-10-14 LAB — PROTEIN C, TOTAL: Protein C, Total: 103 % (ref 60–150)

## 2020-10-14 LAB — ANA W/REFLEX IF POSITIVE: Anti Nuclear Antibody (ANA): NEGATIVE

## 2020-10-14 NOTE — Progress Notes (Signed)
  Speech Language Pathology Treatment: Dysphagia  Patient Details Name: Kristen Grant MRN: [id number] DOB: September 14, 1995 Today's Date: 10/14/2020 Time: 9249-3241 SLP Time Calculation (min) (ACUTE ONLY): 15 min  Assessment / Plan / Recommendation Clinical Impression  Pt seen with lunch meal, tolerated regular solids with mild sensorimotor deficit on left without significant function implication. Pt did have some initial throat clearing with thins, but tolerated further sips throughout her meal well. Pt noted to have flat affect, rapid speech with impaired intonation and prosody. Pt able to verbalize awareness of this. Needs full cognitive linguistic evaluation. Will write order and f/u tomorrow.   HPI HPI: Pt is a 25 y/o female who presented 9/9 with c/o L sided weakness and facial droop. TNK administered. CT head 9/9: Hypodensity in the right anterior temporal lobe is concerning for infarct given hyperdense vessel sign on the right. Pt s/p L MCA mechanical thrombectomy with revascularization on 9/9. On 9/10 pt had L hemiplegia, right gaze, and slurred speech. Neuro contacted and sent for repeat CTA which showed: proximal right M2 occlusion; pt returned to IR for repeat thrombectomy. Pt passed [facility] on 9/9 and 9/11, but SLP consulted as a precaution. PMH includes: anxiety, migraines, birth control use, THC (+)      SLP Plan  All goals met       Recommendations  Diet recommendations: Regular;Thin liquid Liquids provided via: Cup;Straw Medication Administration: Whole meds with puree Supervision: Patient able to self feed                Follow up Recommendations: Inpatient Rehab (for cognitive linguistic needs) Plan: All goals met       GO                Delbert Darley, Katherene Ponto 10/14/2020, 2:24 PM

## 2020-10-14 NOTE — PMR Pre-admission (Signed)
PMR Admission Coordinator Pre-Admission Assessment  Patient: Kristen Grant is an 25 y.o., female MRN: [id number] DOB: [birth year] Height: _0  (175.3 cm) Weight: 88.5 kg  Insurance Information HMO:     PPO:      PCP:      IPA:      80/20:      OTHER:  PRIMARY: [employer] Managed      Policy#: [id number]      Subscriber: patient CM Name: Kristen Grant      Phone#: [phone] ext [phone]     Fax#: [phone] Pre-Cert#: [id number] approved through 9/20 when updates due to [place] [employer] fax [phone] phone [phone] ext [phone]      Employer: Kristen Grant Benefits:  Phone #: [phone]     Name: 9/14 Eff. Date: 02/2020     Deduct: $7000      Out of Pocket Max: $7000      Life Max: none CIR: 70%      SNF: 70% Outpatient: 70%     Co-Pay: visits per medical neccesity Home Health: 70%      Co-Pay: visits per medical neccesity DME: 70%     Co-Pay: 30% Providers: in-network SECONDARY:       Policy#:      Phone#:   Development worker, community:       Phone#:   The Engineer, [employer]" for patients in Inpatient Rehabilitation Facilities with attached "Privacy Act [place] Records" was provided and verbally reviewed with: N/A  Emergency Contact Information Contact Information     Name Relation Home Work Mobile   [place] Mother [phone]         Current Medical History  Patient Admitting Diagnosis: R MCA, recurrent strokes  History of Present Illness: Kristen Grant is a 25 year old right-handed female with morbid obesity BMI 29.92, anxiety, migraine headaches, tobacco use.  Per chart review patient independent prior to admission working full-time.  She lives with her boyfriend and 15-year-old son.  Mother with good support.  Presented 10/11/2020 with acute onset of left-sided weakness, dysarthria and facial droop.  Cranial CT scan showed hypodensity in the right anterior temporal lobe concerning for infarct.  CT angiogram head and neck thrombus in the distal right M1  segment with near complete occlusion.  11 cc infarct core in the right corona radiata.  Admission chemistries alcohol negative, WBC 11,300, glucose 101, urine drug screen positive marijuana as well as benzo's.  Patient underwent mechanical thrombectomy 10/11/2020 with revascularization per interventional radiology requiring repeat thrombectomy 24 hours later for occluded dominant superior division of the right MCA achieving revascularization.  Latest MRI and imaging 10/13/2020 shows acute anterior right MCA territory infarct predominantly involving the right caudate putamen and anterior limb of the right internal capsule, insula, operculum and overlying right frontal lobe.  A few punctate acute infarcts in the right parietal lobe.  Associated cytotoxic edema with slight 2-3 mm leftward midline shift at the foramen of [place].  Probable small amount of hemorrhage in the posterior right frontal lobe/insular region.  Echocardiogram with ejection fraction of 60 to 65% no wall motion abnormalities.  Hyper coag work-up negative.  ANA and RPR negative.  TEE is pending.  Patient was cleared to begin aspirin for CVA prophylaxis.  Lower extremity Dopplers negative for DVT.  Cleviprex initiated for blood pressure control.  Tolerating a regular diet.  Therapy evaluations completed due to patient's left-sided weakness and dysarthria was admitted for a comprehensive rehab program.  Complete NIHSS TOTAL: 4  Patient's medical record  [facility] has been reviewed by the rehabilitation admission coordinator and physician.  Past Medical History  Past Medical History:  Diagnosis Date   Anxiety    Asthma    no treatments required at this time   Migraine headache    Has the patient had major surgery during 100 days prior to admission? Yes  Family History   family history includes Anxiety disorder in her mother; Depression in her mother; Diabetes in her maternal grandmother; Epilepsy in her mother; Hypertension in  her maternal grandmother and mother; Migraines in her mother; Stroke in her maternal grandmother.  Current Medications  Current Facility-Administered Medications:    [MAR Hold]  stroke: mapping our early stages of recovery book, , Does not apply, Once, Kerney Elbe, MD   0.9 %  sodium chloride infusion, 250 mL, Intravenous, Continuous, Rollene Rotunda, John D, PA-C   0.9 %  sodium chloride infusion, , Intravenous, Continuous, Cheryln Manly, NP   [MAR Hold] acetaminophen (TYLENOL) tablet 650 mg, 650 mg, Oral, Q4H PRN, 650 mg at 10/16/20 0837 **OR** [MAR Hold] acetaminophen (TYLENOL) 160 MG/5ML solution 650 mg, 650 mg, Per Tube, Q4H PRN, 650 mg at 10/14/20 0626 **OR** [MAR Hold] acetaminophen (TYLENOL) suppository 650 mg, 650 mg, Rectal, Q4H PRN, Deveshwar, Willaim Rayas, MD   [MAR Hold] albuterol (PROVENTIL) (2.5 MG/3ML) 0.083% nebulizer solution 2.5 mg, 2.5 mg, Nebulization, Q4H PRN, Rollene Rotunda, John D, PA-C   [MAR Hold] aspirin EC tablet 81 mg, 81 mg, Oral, Daily, Dennison Mascot, PA-C, 81 mg at 10/16/20 1214   [MAR Hold] atorvastatin (LIPITOR) tablet 80 mg, 80 mg, Oral, Daily, Dennison Mascot, PA-C, 80 mg at 10/16/20 1214   [MAR Hold] Chlorhexidine Gluconate Cloth 2 % PADS 6 each, 6 each, Topical, Daily, Kerney Elbe, MD, 6 each at 10/16/20 1330   clevidipine (CLEVIPREX) infusion 0.5 mg/mL, 0-21 mg/hr, Intravenous, Continuous, Mick Sell, PA-C   [facility] Hold] docusate sodium (COLACE) capsule 100 mg, 100 mg, Oral, BID, Mick Sell, PA-C, 100 mg at 10/16/20 2204   [facility] Hold] Gerhardt's butt cream, , Topical, TID, Garvin Fila, MD   [facility] Hold] morphine 2 MG/ML injection 2 mg, 2 mg, Intravenous, Q4H PRN, Dennison Mascot, PA-C, 2 mg at 10/13/20 0932   [MAR Hold] norepinephrine (LEVOPHED) 10m in 2549mpremix infusion, 2-10 mcg/min, Intravenous, Titrated, PaAndres Labrum, PA-C, Stopped at 10/14/20 0536   [MAR Hold] ondansetron (ZOFRAN) injection 4 mg, 4 mg, Intravenous, Q6H PRN, FrDennison MascotPA-C, 4 mg  at 10/17/20 0730   [MAR Hold] pantoprazole (PROTONIX) injection 40 mg, 40 mg, Intravenous, Daily, PaMick SellPA-C, 40 mg at 10/16/20 1215   [MAR Hold] polyethylene glycol (MIRALAX / GLYCOLAX) packet 17 g, 17 g, Oral, Daily, PaMick SellPA-C, 17 g at 10/15/20 0935   [MAR Hold] senna-docusate (Senokot-S) tablet 1 tablet, 1 tablet, Oral, QHS PRN, LiKerney ElbeMD   verapamil (ISOPTIN) injection, , , PRN, DeLuanne BrasMD, 2.5 mg at 10/12/20 1739  Patients Current Diet:  Diet Order             Diet NPO time specified  Diet effective midnight                 NPO for TEE on 9/15 On Regular diet with thin liquids prior ; whole meds with puree per SLP eval on 9/12  Precautions / Restrictions Precautions Precautions: Fall Precaution Comments: left hemiparesis Restrictions Weight Bearing Restrictions: No   Has the patient had  2 or more falls or a fall with injury in the past year? No  Prior Activity Level Community (5-7x/wk): works  Prior Functional Level Self Care: Did the patient need help bathing, dressing, using the toilet or eating? Independent  Indoor Mobility: Did the patient need assistance with walking from room to room (with or without device)? Independent  Stairs: Did the patient need assistance with internal or external stairs (with or without device)? Independent  Functional Cognition: Did the patient need help planning regular tasks such as shopping or remembering to take medications? Independent  Patient Information Are you of Hispanic, Latino/a,or Spanish origin?: A. No, not of Hispanic, Latino/a, or Spanish origin What is your race?: A. White Do you need or want an interpreter to communicate with a doctor or health care staff?: 0. No  Patient's Response To:  Health Literacy and Transportation Is the patient able to respond to health literacy and transportation needs?: Yes Health Literacy - How often do you need to have someone help you when you  read instructions, pamphlets, or other written material from your doctor or pharmacy?: Never In the past 12 months, has lack of transportation kept you from medical appointments or from getting medications?: No In the past 12 months, has lack of transportation kept you from meetings, work, or from getting things needed for daily living?: No  Development worker, international aid / [place] Devices/Equipment: None Home Equipment: None  Prior Device Use: Indicate devices/aids used by the patient prior to current illness, exacerbation or injury? None of the above  Current Functional Level Cognition  Overall Cognitive Status: Within Functional Limits for tasks assessed Orientation Level: Oriented X4 General Comments: Continue with flat affect, but very appreciative of session, at times smiling. Following commands appropriately    Extremity Assessment (includes Sensation/Coordination)  Upper Extremity Assessment: LUE deficits/detail RUE Deficits / Details: 2/5 shoulder, 2+/5 elbow (limited by IV site),3-/5 forearm, 2/5 hand, reports numbness in fingers. No functional use. LUE Deficits / Details: no active movement, but noted to shape hand on EVA walker and hand does not fall off lap, lags behind pt with bed mobility, pt reports it is completely numb today vs eval when it was only her fingertips LUE Coordination: decreased fine motor, decreased gross motor  Lower Extremity Assessment: Defer to PT evaluation RLE Deficits / Details: pt with limited hip and knee flexion actively due to pain in RT groin at sheath site. otherwise grossly functional for sensation and strength RLE Sensation: WNL RLE Coordination: WNL LLE Deficits / Details: pt able to sense light touch but reports numbness compared to RLE, knee flexion 2-/5, hip flexion 2-/5, 3/5 ankle dorsiflexion/plantarflexion LLE Coordination: decreased fine motor, decreased gross motor    ADLs  Overall ADL's : Needs  assistance/impaired Eating/Feeding: Minimal assistance, Sitting Grooming: Wash/dry hands, Set up, Sitting, Brushing hair, Total assistance Grooming Details (indicate cue type and reason): washing face somewhat perseveratively, total assist for knots out of her hair and to braid Upper Body Bathing: Moderate assistance, Sitting Lower Body Bathing: Total assistance, +2 for physical assistance, Sit to/from stand Upper Body Dressing : Moderate assistance, Sitting Lower Body Dressing: Total assistance, +2 for physical assistance, Sit to/from stand Toileting- Clothing Manipulation and Hygiene: Minimal assistance, Sit to/from stand Functional mobility during ADLs: Minimal assistance (EVA walker) General ADL Comments: Pt educated in hemitechniques for UB bathing and dressing.    Mobility  Overal bed mobility: Needs Assistance Bed Mobility: Supine to Sit Rolling: Mod assist Sidelying to sit: Mod assist, +  2 for physical assistance Supine to sit: Min assist, HOB elevated Sit to supine: Mod assist, +2 for physical assistance General bed mobility comments: Reliant on HOB elevated and use of bed rail, improved ability to manage LLE to EOB without UE assist, ultimately requiring minA to scoot L hip to EOB    Transfers  Overall transfer level: Needs assistance Equipment used: 2 person hand held assist Transfers: Sit to/from Stand Sit to Stand: Min assist, +2 safety/equipment General transfer comment: Pt frequently reaching for RUE support to pull to stand, requiring cues to push with RUE, requiring minA for trunk elevation and stability; totalA for LUE management    Ambulation / Gait / Stairs / Wheelchair Mobility  Ambulation/Gait Ambulation/Gait assistance: Mod assist, +2 safety/equipment Gait Distance (Feet): 10 Feet (+ 60' + 1' + 40') Assistive device: 2 person hand held assist, 1 person hand held assist, 4-wheeled walker Gait Pattern/deviations: Step-to pattern, Decreased dorsiflexion - left,  Decreased weight shift to left General Gait Details: Slow, unsteady gait with initial bilateral HHA, dependent for LUE management, reliant on RUE support and modA for stability; additional gait trials with eva walker to encourage weight bearing through LUE, as well as single LUE support, pt fluctuating min-modA for stability; inital improved ability to take complete steps with L foot, reliant on increased hip flex to clear foot, worsening LLE dragging with fatigue Gait velocity: Decreased    Posture / Balance Dynamic Sitting Balance Sitting balance - Comments: Difficulty reaching outside of BOS to reach feet with RUE Balance Overall balance assessment: Needs assistance Sitting-balance support: Single extremity supported, Feet supported Sitting balance-Leahy Scale: Fair Sitting balance - Comments: Difficulty reaching outside of BOS to reach feet with RUE Postural control: Left lateral lean, Posterior lean Standing balance support: During functional activity, Bilateral upper extremity supported, Single extremity supported Standing balance-Leahy Scale: Poor Standing balance comment: Reliant on UE support and/or external assist    Special needs/care consideration Likely elective PFO closure after rehab.   Previous Home Environment  Living Arrangements: Children, Spouse/significant other  Lives With: Son, Significant other Available Help at Discharge: Family, Available 24 hours/day Type of Home: House Home Layout: One level Home Access: Stairs to enter Entrance Stairs-Rails: None Entrance Stairs-Number of Steps: 2-3 Bathroom Shower/Tub: Optometrist: Yes How Accessible: Accessible via walker [place]: No  Discharge Living Setting Plans for Discharge Living Setting: Patient's home Type of Home at Discharge: House Discharge Home Layout: One level Discharge Home Access: Stairs to enter Entrance Stairs-Rails: None Entrance  Stairs-Number of Steps: 2-3 Discharge Bathroom Shower/Tub: Tub/shower unit Discharge Bathroom Toilet: Standard Discharge Bathroom Accessibility: Yes How Accessible: Accessible via walker Does the patient have any problems obtaining your medications?: No  Social/Family/Support Systems Patient Roles: Parent, Partner Anticipated Caregiver: Marlane Hatcher, mother and Fannie Knee, boyfriend Anticipated Caregiver's Contact Information: Katharine Look: [phone] Caregiver Availability: 24/7 Discharge Plan Discussed with Primary Caregiver: Yes Is Caregiver In Agreement with Plan?: Yes Does Caregiver/Family have Issues with Lodging/Transportation while Pt is in Rehab?: No  Goals Patient/Family Goal for Rehab: Mod I to supervision with PT, supervision to min with OT, Mod I ot superivsion with SLP Expected length of stay: ELOS 10 to 14 days Pt/Family Agrees to Admission and willing to participate: Yes Program Orientation Provided & Reviewed with Pt/Caregiver Including Roles  & Responsibilities: Yes  Decrease burden of Care through IP rehab admission: NA  Possible need for SNF placement upon discharge: Not nictitated  Patient Condition: I have reviewed medical  records from [facility], spoken with CM, and patient and family member. I met with patient at the bedside for inpatient rehabilitation assessment.  Patient will benefit from ongoing PT, OT, and SLP, can actively participate in 3 hours of therapy a day 5 days of the week, and can make measurable gains during the admission.  Patient will also benefit from the coordinated team approach during an Inpatient Acute Rehabilitation admission.  The patient will receive intensive therapy as well as Rehabilitation physician, nursing, social worker, and care management interventions.  Due to bladder management, bowel management, safety, skin/wound care, disease management, medication administration, pain management, and patient education the patient requires 24  hour a day rehabilitation nursing.  The patient is currently mod assist overall with mobility and basic ADLs.  Discharge setting and therapy post discharge at home with home health is anticipated.  Patient has agreed to participate in the Acute Inpatient Rehabilitation Program and will admit today.  Preadmission Screen Completed By:  Danne Baxter RN MSN, 10/17/2020 11:43 AM ______________________________________________________________________   Discussed status with Dr. Naaman Plummer on  10/17/2020 at 70 and received approval for admission today.  Admission Coordinator:  Danne Baxter RN MSN, time 1150 Date 10/17/2020   Assessment/Plan: Diagnosis: right MCA infarct Does the need for close, 24 hr/day Medical supervision in concert with the patient's rehab needs make it unreasonable for this patient to be served in a less intensive setting? Yes Co-Morbidities requiring supervision/potential complications: obesity, anxiety d/o, migraines Due to bladder management, bowel management, safety, skin/wound care, disease management, medication administration, pain management, and patient education, does the patient require 24 hr/day rehab nursing? Yes Does the patient require coordinated care of a physician, rehab nurse, PT, OT, and SLP to address physical and functional deficits in the context of the above medical diagnosis(es)? Yes Addressing deficits in the following areas: balance, endurance, locomotion, strength, transferring, bowel/bladder control, bathing, dressing, feeding, grooming, toileting, cognition, speech, and psychosocial support Can the patient actively participate in an intensive therapy program of at least 3 hrs of therapy 5 days a week? Yes The potential for patient to make measurable gains while on inpatient rehab is excellent Anticipated functional outcomes upon discharge from inpatient rehab: modified independent and supervision PT, modified independent and supervision OT, modified  independent SLP Estimated rehab length of stay to reach the above functional goals is: 10-14 days Anticipated discharge destination: Home 10. Overall Rehab/Functional Prognosis: excellent   MD Signature: Meredith Staggers, MD, [facility] 10/17/2020

## 2020-10-14 NOTE — Progress Notes (Signed)
Lower extremity venous & TCD bubble study has been completed.   Preliminary results in CV Proc.   Aundra Millet Cletis Clack 10/14/2020 3:50 PM

## 2020-10-14 NOTE — Progress Notes (Signed)
Inpatient Rehab Admissions:  Inpatient Rehab Consult received.  I met with patient and mother at the bedside for rehabilitation assessment and to discuss goals and expectations of an inpatient rehab admission.  Both acknowledged understanding of CIR goals and expectations. Both interested in pt pursuing CIR.  Will continue to follow.  Signed: Gayland Curry, [place], [place] Admissions Coordinator [phone]

## 2020-10-14 NOTE — Progress Notes (Signed)
Referring Physician(s): Dr. Otelia Limes  Supervising Physician: Julieanne Cotton  Patient Status:  [facility] - In-pt  Chief Complaint: CVA R MCA revascularization in IR 9/9 Repeat angioplasty 9/10  Subjective: Resting in bed.  Appears fatigued.  Participates in conversation and assessment.  Moving all extremities.  Remains weak on left side.  Mild speech slur, although difficult to tell if fatigue contributing.  Reports headache overnight.  Mother at bedside   Allergies: Patient has no known allergies.  Medications: Prior to Admission medications   Medication Sig Start Date End Date Taking? Authorizing Provider  medroxyPROGESTERone (DEPO-PROVERA) 150 MG/ML injection Inject 1 mL (150 mg total) into the muscle every 3 (three) months. 12/06/19 10/12/20 Yes Larene Pickett, FNP  norethindrone (MICRONOR) 0.35 MG tablet Take 1 tablet (0.35 mg total) by mouth daily for 28 days. Patient not taking: Reported on 10/11/2020 10/06/19 11/03/19  Alberteen Spindle, CNM     Vital Signs: BP 103/60   Pulse 66   Temp 98.7 F (37.1 C) (Oral)   Resp 11   Ht 5\' 9"  (1.753 m)   Wt 202 lb 9.6 oz (91.9 kg)   SpO2 99%   BMI 29.92 kg/m   Physical Exam NAD, alert Neuro: Tongue midline, EOMs intact, Mild left facial droop.  Can lift left arm off the bed and extend fingers with effort.  Decreased hand drip strength on left. Can bend left and right knee.  Lifts left leg off the bed.  Skin: Rt groin soft - no bleeding; no hematoma Rt foot 2 + pulses  Imaging: CT HEAD WO CONTRAST ( )  Result Date: 10/12/2020 CLINICAL DATA:  Follow-up examination for acute stroke, status post thrombectomy. EXAM: CT HEAD WITHOUT CONTRAST TECHNIQUE: Contiguous axial images were obtained from the base of the skull through the vertex without intravenous contrast. COMPARISON:  Prior CTA from earlier the same day. FINDINGS: Brain: Loss of gray-white matter differentiation involving the right insular region as well as the  overlying right frontal operculum. Fogging at the right caudate and lentiform nuclei present as well. Findings consistent with evolving right MCA distribution infarct. Superimposed hyperdensity at the level of the right sylvian fissure could reflect contrast staining or possibly small amount of subarachnoid hemorrhage (series 2, image 20). No significant mass effect at this time. Remainder the brain is otherwise normal in appearance. No other hemorrhage or acute large vessel territory infarct. No mass lesion or midline shift. No hydrocephalus. No extra-axial fluid collection. Vascular: No visible hyperdense vessel. Skull: Scalp soft tissues and calvarium demonstrate no new abnormality. Sinuses/Orbits: Globes and orbital soft tissues within normal limits. Scattered mucosal thickening in opacity present within the sphenoid ethmoidal and maxillary sinuses with a few scattered retention cyst. Mastoid air cells are clear. Other: None. IMPRESSION: 1. Evolving acute right MCA distribution infarct. Superimposed hyperdensity at the level of the right Sylvian fissure could reflect contrast staining or possibly small amount of subarachnoid hemorrhage. Short interval follow-up CT suggested. No significant mass effect at this time. 2. No other new acute intracranial abnormality. These results were communicated to Dr. 12/12/2020 at 8:55 pm on 10/12/2020 by text page via the [facility] messaging system. Electronically Signed   By: [facility] M.D.   On: 10/12/2020 20:56   MR BRAIN WO CONTRAST  Result Date: 10/13/2020 CLINICAL DATA:  Stroke, follow up EXAM: MRI HEAD WITHOUT CONTRAST TECHNIQUE: Multiplanar, multiecho pulse sequences of the brain and surrounding structures were obtained without intravenous contrast. COMPARISON:  CT head 10/12/2020. FINDINGS: Brain: Acute infarct  involving the anterior right MCA territory, predominantly including the caudate, putamen, anterior limb of the right internal capsule, insula, operculum  and overlying right frontal lobe. A few punctate acute infarcts in the right parietal lobe. Associated cytotoxic edema with slight (2-3 mm) leftward midline shift at the foramen of Monro. Small amount of susceptibility artifact in the posterior right frontal lobe/insular region, suggestive of small volume hemorrhage. No hydrocephalus. No extra-axial fluid collection. Vascular: Major arterial flow voids are maintained at the skull base. Skull and upper cervical spine: Normal marrow signal. Sinuses/Orbits: Right sphenoid sinus retention cyst. Otherwise, largely clear sinuses. Unremarkable orbits. Other: No mastoid effusions. IMPRESSION: 1. Acute anterior right MCA territory infarct, predominantly involving the right caudate, putamen, anterior limb of the right internal capsule, insula, operculum and overlying right frontal lobe. A few punctate acute infarcts in the right parietal lobe. 2. Associated cytotoxic edema with slight (2-3 mm) leftward midline shift at the foramen of Monro. 3. Probable small amount of hemorrhage in the posterior right frontal lobe/insular region. Electronically Signed   By: Feliberto Harts M.D.   On: 10/13/2020 14:38   ECHOCARDIOGRAM COMPLETE  Result Date: 10/11/2020    ECHOCARDIOGRAM REPORT   Patient Name:   KELISHA DALL Date of Exam: 10/11/2020 Medical Rec #:  [id number]      Height:       67.0 in Accession #:    [id number]     Weight:       208.6 lb Date of Birth:  Jan 09, 1996       BSA:          2.059 m Patient Age:    25 years       BP:           124/76 mmHg Patient Gender: F              HR:           76 bpm. Exam Location:  Inpatient Procedure: 2D Echo Indications:    stroke  History:        Patient has no prior history of Echocardiogram examinations.  Sonographer:    Delcie Roch RDCS Referring Phys: [phone] ERIC LINDZEN IMPRESSIONS  1. Left ventricular ejection fraction, by estimation, is 60 to 65%. The left ventricle has normal function. The left ventricle has no regional wall  motion abnormalities. Left ventricular diastolic parameters were normal.  2. Right ventricular systolic function is normal. The right ventricular size is normal. Tricuspid regurgitation signal is inadequate for assessing PA pressure.  3. The mitral valve is normal in structure. No evidence of mitral valve regurgitation. No evidence of mitral stenosis.  4. The aortic valve was not well visualized. Aortic valve regurgitation is not visualized. No aortic stenosis is present. Conclusion(s)/Recommendation(s): No intracardiac source of embolism detected on this transthoracic study. A transesophageal echocardiogram is recommended to exclude cardiac source of embolism if clinically indicated. FINDINGS  Left Ventricle: Left ventricular ejection fraction, by estimation, is 60 to 65%. The left ventricle has normal function. The left ventricle has no regional wall motion abnormalities. The left ventricular internal cavity size was normal in size. There is  no left ventricular hypertrophy. Left ventricular diastolic parameters were normal. Right Ventricle: The right ventricular size is normal. No increase in right ventricular wall thickness. Right ventricular systolic function is normal. Tricuspid regurgitation signal is inadequate for assessing PA pressure. Left Atrium: Left atrial size was normal in size. Right Atrium: Right atrial size was normal in size. Pericardium: There is  no evidence of pericardial effusion. Mitral Valve: The mitral valve is normal in structure. No evidence of mitral valve regurgitation. No evidence of mitral valve stenosis. Tricuspid Valve: The tricuspid valve is normal in structure. Tricuspid valve regurgitation is trivial. Aortic Valve: The aortic valve was not well visualized. Aortic valve regurgitation is not visualized. No aortic stenosis is present. Pulmonic Valve: The pulmonic valve was not well visualized. Pulmonic valve regurgitation is not visualized. Aorta: The aortic root and ascending aorta  are structurally normal, with no evidence of dilitation. IAS/Shunts: The interatrial septum was not well visualized.  LEFT VENTRICLE PLAX 2D LVIDd:         4.50 cm  Diastology LVIDs:         2.90 cm  LV e' medial:    10.60 cm/s LV PW:         0.90 cm  LV E/e' medial:  7.6 LV IVS:        0.80 cm  LV e' lateral:   15.90 cm/s LVOT diam:     1.90 cm  LV E/e' lateral: 5.0 LV SV:         62 LV SV Index:   30 LVOT Area:     2.84 cm  RIGHT VENTRICLE             IVC RV S prime:     12.90 cm/s  IVC diam: 1.10 cm TAPSE (M-mode): 2.3 cm LEFT ATRIUM             Index       RIGHT ATRIUM          Index LA diam:        3.20 cm 1.55 cm/m  RA Area:     9.88 cm LA Vol (A2C):   31.7 ml 15.40 ml/m RA Volume:   19.60 ml 9.52 ml/m LA Vol (A4C):   22.5 ml 10.93 ml/m LA Biplane Vol: 26.8 ml 13.02 ml/m  AORTIC VALVE LVOT Vmax:   126.00 cm/s LVOT Vmean:  81.500 cm/s LVOT VTI:    0.217 m  AORTA Ao Root diam: 2.80 cm Ao Asc diam:  2.60 cm MITRAL VALVE MV Area (PHT): 4.49 cm    SHUNTS MV Decel Time: 169 msec    Systemic VTI:  0.22 m MV E velocity: 80.10 cm/s  Systemic Diam: 1.90 cm MV A velocity: 51.80 cm/s MV E/A ratio:  1.55 Epifanio Lescheshristopher Schumann MD Electronically signed by Epifanio Lescheshristopher Schumann MD Signature Date/Time: 10/11/2020/10:54:58 PM    Final    CT HEAD CODE STROKE WO CONTRAST  Result Date: 10/11/2020 CLINICAL DATA:  Code stroke.  Weakness EXAM: CT HEAD WITHOUT CONTRAST TECHNIQUE: Contiguous axial images were obtained from the base of the skull through the vertex without intravenous contrast. COMPARISON:  None. FINDINGS: Brain: There is hypodensity in the right temporal lobe. The remainder of the brain parenchyma is normal in appearance. There is no evidence of acute intracranial hemorrhage. There is no mass lesion. There is no midline shift. Vascular: There is a hyperdense vessel sign on the right. Skull: Normal. Negative for fracture or focal lesion. Sinuses/Orbits: There are mucous retention cysts in the bilateral maxillary  and right sphenoid sinuses. The globes and orbits are unremarkable. Other: None. ASPECTS [facility]([place] Stroke Program Early CT Score) - Ganglionic level infarction (caudate, lentiform nuclei, internal capsule, insula, M1-M3 cortex): 3 - Supraganglionic infarction (M4-M6 cortex): 7 Total score (0-10 with 10 being normal): 10 IMPRESSION: 1. Hypodensity in the right anterior temporal lobe is concerning for  infarct given hyperdense vessel sign on the right. Vascular imaging has already been performed at the time of dictation. 2. ASPECTS is 10 These results were called by telephone at the time of interpretation on 10/11/2020 at 9:55 am to provider DR Otelia Limes, who verbally acknowledged these results. Electronically Signed   By: Lesia Hausen M.D.   On: 10/11/2020 09:56   CT ANGIO HEAD NECK W WO CM W PERF (CODE STROKE)  Result Date: 10/12/2020 CLINICAL DATA:  Neuro deficit, acute, stroke suspected EXAM: CT ANGIOGRAPHY HEAD AND NECK CT PERFUSION BRAIN TECHNIQUE: Multidetector CT imaging of the head and neck was performed using the standard protocol during bolus administration of intravenous contrast. Multiplanar CT image reconstructions and MIPs were obtained to evaluate the vascular anatomy. Carotid stenosis measurements (when applicable) are obtained utilizing NASCET criteria, using the distal internal carotid diameter as the denominator. Multiphase CT imaging of the brain was performed following IV bolus contrast injection. Subsequent parametric perfusion maps were calculated using RAPID software. CONTRAST:  OMNIPAQUE IOHEXOL 350 MG/ML SOLN COMPARISON:  10/11/2020. FINDINGS: CT HEAD FINDINGS Brain: Hypoattenuation and loss of gray-white differentiation in the right MCA territory, including the right frontal lobe, right insula, internal capsule right lentiform nucleus, caudate, and inferior right frontal cortex/operculum. Slight midline shift. No acute hemorrhage, hydrocephalus, mass lesion, or visible extra-axial fluid  collection. ASPECTS [facility] Stroke Program Early CT Score) - Ganglionic level infarction (caudate, lentiform nuclei, internal capsule, insula, M1-M3 cortex): 1 - Supraganglionic infarction (M4-M6 cortex): 3 Total score (0-10 with 10 being normal): 4 Vascular: See below. Skull: No acute fracture. Sinuses: No acute findings. Orbits: No acute findings. Review of the MIP images confirms the above findings CTA NECK FINDINGS Aortic arch: Great vessel origins are patent. Right carotid system: No evidence of dissection, stenosis (50% or greater) or occlusion. Left carotid system: No evidence of dissection, stenosis (50% or greater) or occlusion. Vertebral arteries: Mildly left dominant. No significant (greater than 50%) stenosis. Skeleton: No acute abnormality. Other neck: No acute abnormality. CTA HEAD FINDINGS Anterior circulation: Bilateral ICAs and M1 MCAs are patent. Occlusion a of an anterior right M2 MCA branch (see series 11, images 237 through 240). Bilateral ACAs are patent. Posterior circulation: Bilateral intradural vertebral arteries basilar artery and posterior cerebral arteries are patent without proximal high-grade stenosis. Venous: No evidence of dural sinus thrombosis. CT Brain Perfusion Findings: ASPECTS: 4 CBF (<30%) Volume: 65mL Perfusion (Tmax>6.0s) volume: 52mL Mismatch Volume: 35mL Infarction Location:Right MCA territory IMPRESSION: CT Head: 1. 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 ASPECTS is 4. Slight leftward midline shift. 2. No acute hemorrhage. CTA: 1. Proximal right M2 MCA occlusion. 2. No significant stenosis in the neck. CT Perfusion: Large area of core infarct in the right MCA territory, measuring at least 55 mL and likely underestimated given superior extent is not imaged. Small (8 mL) area of ischemic penumbra. Findings discussed with Dr. Otelia Limes via telephone at  4:00 PM Electronically Signed   By: Feliberto Harts M.D.   On: 10/12/2020 16:26   CT ANGIO HEAD NECK W WO CM W PERF (CODE STROKE)  Result Date: 10/11/2020 EXAM: CT ANGIOGRAPHY HEAD AND NECK CT PERFUSION BRAIN TECHNIQUE: Multidetector CT imaging of the head and neck was performed using the standard protocol during bolus administration of intravenous contrast. Multiplanar CT image reconstructions and MIPs were obtained to evaluate the vascular anatomy. Carotid stenosis measurements (when applicable) are  obtained utilizing NASCET criteria, using the distal internal carotid diameter as the denominator. Multiphase CT imaging of the brain was performed following IV bolus contrast injection. Subsequent parametric perfusion maps were calculated using RAPID software. CONTRAST:  OMNIPAQUE IOHEXOL 350 MG/ML SOLN COMPARISON:  Same-day noncontrast CT head FINDINGS: CTA NECK FINDINGS Aortic arch: Standard branching. Imaged portion shows no evidence of aneurysm or dissection. No significant stenosis of the major arch vessel origins. Right carotid system: No evidence of dissection, stenosis (50% or greater) or occlusion. Left carotid system: No evidence of dissection, stenosis (50% or greater) or occlusion. Vertebral arteries: The left vertebral artery is dominant, a normal variant. The vertebral arteries are patent, without hemodynamically significant stenosis, occlusion, or dissection. Skeleton: The bones are normal. Other neck: The soft tissues are normal. Upper chest: The lung apices are clear. Review of the MIP images confirms the above findings CTA HEAD FINDINGS Anterior circulation: The bilateral intracranial ICAs are patent. There is thrombus in the distal right M1 segment with near complete occlusion. There is reconstitution of flow in the superior M2 division but no inferior M2 division is identified. The left MCA and bilateral ACAs are patent. There is no aneurysm. Posterior circulation: The V4 segments of the  vertebral arteries are patent. The basilar artery is patent. The bilateral PCAs are patent. Venous sinuses: Not evaluated due to contrast timing. Anatomic variants: None. Review of the MIP images confirms the above findings CT Brain Perfusion Findings: CBF (<30%) Volume: 11mL Perfusion (Tmax>6.0s) volume: 86mL Mismatch Volume: 75mL Infarction Location:Right corona radiata IMPRESSION: 1. Thrombus in the distal right M1 segment with near complete occlusion. There is reconstitution of flow in superior M2 division but no inferior M2 division is identified. 2. 11 cc infarct core in the right corona radiata, 75 cc penumbra. These results were called by telephone at the time of interpretation on 10/11/2020 at 9:58 am to provider Dr Otelia Limes, who verbally acknowledged these results. Electronically Signed   By: Lesia Hausen M.D.   On: 10/11/2020 10:10    Labs:  CBC: Recent Labs    10/11/20 0936 10/11/20 0943 10/12/20 0018 10/13/20 0308  WBC 6.3  --  8.5 13.4*  HGB 13.5 12.9 13.2 11.6*  HCT 40.4 38.0 39.3 34.4*  PLT 283  --  304 290     COAGS: Recent Labs    10/11/20 0936  INR 1.0  APTT 27     BMP: Recent Labs    10/11/20 0936 10/11/20 0943 10/12/20 0018 10/13/20 0308  NA 138 141 137 140  K 4.2 4.2 3.5 3.6  CL 110 109 107 112*  CO2 24  --  19* 21*  GLUCOSE 101* 100* 156* 139*  BUN CALCIUM 8.8*  --  8.9 8.4*  CREATININE 0.56 0.50 0.50 0.45  GFRNONAA >60  --  >60 >60     LIVER FUNCTION TESTS: Recent Labs    10/11/20 0936 10/13/20 0308  BILITOT 0.8 1.3*  AST 20 14*  ALT 13 14  ALKPHOS 44 37*  PROT 6.0* 5.7*  ALBUMIN 3.8 3.3*     Assessment and Plan: Right MCA occlusion s/p revascularization 9/9 with thrombectomy Reocclusion, repeat thrombectomy 9/10 after worsening left-sided weakness Patient assessed alongside Dr. Corliss Skains today.  She is improved compared to yesterday, however remains weak on the left side.   MR Brain yesterday showed: 1. Acute anterior  right MCA territory infarct, predominantly involving the right caudate, putamen, anterior limb of the right internal capsule,  insula, operculum and overlying right frontal lobe. A few punctate acute infarcts in the right parietal lobe. 2. Associated cytotoxic edema with slight (2-3 mm) leftward midline shift at the foramen of Monro. 3. Probable small amount of hemorrhage in the posterior right frontal lobe/insular region.  Procedure site intact.  Further plan and management per Neuro.   Of note, ECHO was normal.  LE Dopplers pending today. Low BP overnight.  IR to follow.   Electronically Signed: Hoyt Koch, PA 10/14/2020, 10:22 AM   I spent a total of 25 Minutes at the the patient's bedside AND on the patient's hospital floor or unit, greater than 50% of which was counseling/coordinating care for CVA/R MCA revascularization

## 2020-10-14 NOTE — Evaluation (Signed)
Occupational Therapy Evaluation Patient Details Name: Kristen Grant MRN: [id number] DOB: 1995-03-26 Today's Date: 10/14/2020   History of Present Illness Pt is a 25 yo female presenting 9/9 with c/o L sided weakness and facial droop. Pt s/p L MCA mechanical thrombectomy with revascularization on 9/9. 9/10 pt lethargic with left hemiplegia with additional CVA and return to IR for repeat thrombectomy. PMH includes: anxiety, migraines, birth control use, THC (+)   Clinical Impression   Pt was independent prior to admission. Present with diffuse pain, generalized weakness, L hemiparesis with numbness in fingers and poor standing balance. She needs +2 assist for all mobility and min to total assist for ADL. Pt will need intensive rehab upon discharge. Will follow acutely.      Recommendations for follow up therapy are one component of a multi-disciplinary discharge planning process, led by the attending physician.  Recommendations may be updated based on patient status, additional functional criteria and insurance authorization.   Follow Up Recommendations  CIR    Equipment Recommendations  3 in 1 bedside commode    Recommendations for Other Services       Precautions / Restrictions Precautions Precautions: Fall Precaution Comments: left hemiparesis Restrictions Weight Bearing Restrictions: No      Mobility Bed Mobility Overal bed mobility: Needs Assistance Bed Mobility: Rolling;Sidelying to Sit;Sit to Supine Rolling: Mod assist Sidelying to sit: Mod assist;+2 for physical assistance   Sit to supine: Mod assist;+2 for physical assistance   General bed mobility comments: repeated cues for each movement but pt able to complete movements with BLE to move to EOB, then needing modA of 2 to complete elevation of trunk from [facility]. minA initially for static sitting, pt then able to maintain withotu assist. modA of 2 to return to sitting. pt again needing repeated sequential cues for movement  of each LE and repositioning    Transfers Overall transfer level: Needs assistance Equipment used: 2 person hand held assist Transfers: Sit to/from Stand Sit to Stand: Mod assist;Min assist;+2 physical assistance         General transfer comment: initially modA with L knee block, assist at hips to facilitate hip extension and to center balance (pt with L lateral lean). Pt then able to progress to +2 minA with assist to steady at L knee. pt able to demo good control with direct cues. completed x5 in session    Balance Overall balance assessment: Needs assistance Sitting-balance support: Single extremity supported;Feet supported Sitting balance-Leahy Scale: Poor Sitting balance - Comments: minA initially with progression to minG with pt resting BUE on her knees Postural control: Left lateral lean;Posterior lean Standing balance support: Bilateral upper extremity supported Standing balance-Leahy Scale: Poor Standing balance comment: minA to L hip to improve neutral stance rather than L lean. pt able to wt shift back and forth but unable to maintain midline without verbal or tactile cues                           ADL either performed or assessed with clinical judgement   ADL Overall ADL's : Needs assistance/impaired Eating/Feeding: Minimal assistance;Sitting   Grooming: Minimal assistance;Sitting   Upper Body Bathing: Moderate assistance;Sitting   Lower Body Bathing: Total assistance;+2 for physical assistance;Sit to/from stand   Upper Body Dressing : Moderate assistance;Sitting   Lower Body Dressing: Total assistance;+2 for physical assistance;Sit to/from stand       Toileting- Architect and Hygiene: Total assistance;+2 for physical assistance;Bed level  Vision Baseline Vision/History: 0 No visual deficits       Perception     Praxis      Pertinent Vitals/Pain Pain Assessment: Faces Faces Pain Scale: Hurts a little bit Pain  Location: R groin, headache, back and neck (stiff) Pain Descriptors / Indicators: Aching;Guarding;Discomfort Pain Intervention(s): Monitored during session;Repositioned     Hand Dominance Right   Extremity/Trunk Assessment Upper Extremity Assessment Upper Extremity Assessment: LUE deficits/detail RUE Deficits / Details: 2/5 shoulder, 2+/5 elbow (limited by IV site),3-/5 forearm, 2/5 hand, reports numbness in fingers. No functional use. LUE Coordination: decreased fine motor;decreased gross motor   Lower Extremity Assessment Lower Extremity Assessment: Defer to PT evaluation   Cervical / Trunk Assessment Cervical / Trunk Assessment: Other exceptions Cervical / Trunk Exceptions: increased body habitus   Communication Communication Communication: No difficulties   Cognition Arousal/Alertness: Awake/alert Behavior During Therapy: Flat affect Overall Cognitive Status: Within Functional Limits for tasks assessed                                 General Comments: pt with slight continued anxiety, but mostly flat affect at this time. needing repeated cues to complete tasks at times.   General Comments  VSS on RA    Exercises Exercises: Other exercises Other Exercises Other Exercises: partial squat with +1 assist and +1 managing L knee to prevent buckling and hyperextension. pt able to demo improved control with reps, needing cues for knee extension to achieve full upright standing. x10 through session Other Exercises: pt educated on cervical rotation and stretching to include: lateral rotation, lateral flexion, head circles, and shoulder circles as pt c/o significant neck stiffness   Shoulder Instructions      Home Living Family/patient expects to be discharged to:: Private residence Living Arrangements: Children;Spouse/significant other Available Help at Discharge: Family;Available 24 hours/day Type of Home: House Home Access: Stairs to enter [employer]  of Steps: 2-3 Entrance Stairs-Rails: None Home Layout: One level     Bathroom Shower/Tub: Chief Strategy Officer: Standard     Home Equipment: None      Lives With: Son;Significant other    Prior Functioning/Environment Level of Independence: Independent        Comments: 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 Needs to be on her feet all day at either place. 25 yo son who just started school        OT Problem List: Decreased strength;Decreased activity tolerance;Impaired balance (sitting and/or standing);Decreased coordination;Obesity;Impaired UE functional use;Pain      OT Treatment/Interventions: Self-care/ADL training;Neuromuscular education;DME and/or AE instruction;Therapeutic activities;Balance training;Patient/family education    OT Goals(Current goals can be found in the care plan section) Acute Rehab OT Goals Patient Stated Goal: be able to walk and go home OT Goal Formulation: With patient Time For Goal Achievement: 10/28/20 Potential to Achieve Goals: Good ADL Goals Pt Will Perform Eating: with modified independence;sitting Pt Will Perform Grooming: with set-up;sitting Pt Will Perform Upper Body Bathing: with min assist;sitting Pt Will Perform Upper Body Dressing: with min assist;sitting Pt Will Transfer to Toilet: with mod assist;ambulating;bedside commode Pt/caregiver will Perform Home Exercise Program: Increased strength;Left upper extremity;With minimal assist Additional ADL Goal #1: Pt will perform bed mobility with min assist in preparation for ADL.  OT Frequency: Min 3X/week   Barriers to D/C:            Co-evaluation  PT/OT/SLP Co-Evaluation/Treatment: Yes Reason for Co-Treatment: Complexity of the patient's impairments (multi-system involvement);For patient/therapist safety PT goals addressed during session: Mobility/safety with mobility;Balance;Strengthening/ROM OT goals addressed  during session: ADL's and self-care      AM-PAC OT "6 Clicks" Daily Activity     Outcome Measure Help from another person eating meals?: A Little Help from another person taking care of personal grooming?: A Little Help from another person toileting, which includes using toliet, bedpan, or urinal?: Total Help from another person bathing (including washing, rinsing, drying)?: A Lot Help from another person to put on and taking off regular upper body clothing?: A Lot Help from another person to put on and taking off regular lower body clothing?: Total 6 Click Score: 12   End of Session    Activity Tolerance: Patient tolerated treatment well Patient left: in bed;with call bell/phone within reach;with family/visitor present  OT Visit Diagnosis: Unsteadiness on feet (R26.81);Muscle weakness (generalized) (M62.81);Hemiplegia and hemiparesis;Pain Hemiplegia - Right/Left: Left Hemiplegia - dominant/non-dominant: Non-Dominant Hemiplegia - caused by: Cerebral infarction                Time: 4827-0786 OT Time Calculation (min): 32 min Charges:  OT General Charges $OT Visit: 1 Visit OT Evaluation $OT Eval Moderate Complexity: 1 Mod  Martie Round, OTR/L Acute Rehabilitation Services Pager: [phone] Office: [phone]  Evern Bio 10/14/2020, 4:57 PM

## 2020-10-14 NOTE — Progress Notes (Addendum)
STROKE TEAM PROGRESS NOTE   INTERVAL HISTORY Patient is sitting up in bed.  Her mother and boyfriend are at the bedside.  She complains of persistent headache but is refusing medicine for the same.  She has left-sided hemiplegia with persists but is able to move her extremities slightly off the bed.  Vital signs are stable.  Blood pressure adequately controlled.  She is off pressors. Patient does give history of migraine headaches, using Depo-Medrol injections and vaping nicotine.  She has no history of miscarriages.  No history of DVT or pulmonary embolism or family history of strokes at a young age.  However there is history of multiple family members who had strokes on the mother side later in life Her mother is at the bedside providing support.   Vitals:   10/14/20 0830 10/14/20 0900 10/14/20 0930 10/14/20 1000  BP: (!) 96/52 99/74 (!) 98/58 103/60  Pulse:      Resp: 13 18 17 11   Temp:      TempSrc:      SpO2: 99% 99% 98% 99%  Weight:      Height:       CBC:  Recent Labs  Lab 10/12/20 0018 10/13/20 0308  WBC 8.5 13.4*  NEUTROABS 7.4 11.2*  HGB 13.2 11.6*  HCT 39.3 34.4*  MCV 86.9 89.4  PLT 304 290    Basic Metabolic Panel:  Recent Labs  Lab 10/12/20 0018 10/13/20 0308  NA 137 140  K 3.5 3.6  CL 107 112*  CO2 19* 21*  GLUCOSE 156* 139*  BUN 6 6  CREATININE 0.50 0.45  CALCIUM 8.9 8.4*  MG  --  1.7     Lipid Panel:  Recent Labs  Lab 10/12/20 0018  CHOL 171  TRIG 55  HDL 43  CHOLHDL 4.0  VLDL 11  LDLCALC 12/12/20*     HgbA1c:  Recent Labs  Lab 10/12/20 0018  HGBA1C 5.0    Urine Drug Screen:  Recent Labs  Lab 10/11/20 2219  LABOPIA NONE DETECTED  COCAINSCRNUR NONE DETECTED  LABBENZ POSITIVE*  AMPHETMU NONE DETECTED  THCU POSITIVE*  LABBARB NONE DETECTED     Alcohol Level  Recent Labs  Lab 10/11/20 0936  ETH <10     IMAGING past 24 hours MR BRAIN WO CONTRAST  Result Date: 10/13/2020 CLINICAL DATA:  Stroke, follow up EXAM: MRI HEAD  WITHOUT CONTRAST TECHNIQUE: Multiplanar, multiecho pulse sequences of the brain and surrounding structures were obtained without intravenous contrast. COMPARISON:  CT head 10/12/2020. FINDINGS: Brain: Acute infarct involving the anterior right MCA territory, predominantly including the caudate, putamen, anterior limb of the right internal capsule, insula, operculum and overlying right frontal lobe. A few punctate acute infarcts in the right parietal lobe. Associated cytotoxic edema with slight (2-3 mm) leftward midline shift at the foramen of Monro. Small amount of susceptibility artifact in the posterior right frontal lobe/insular region, suggestive of small volume hemorrhage. No hydrocephalus. No extra-axial fluid collection. Vascular: Major arterial flow voids are maintained at the skull base. Skull and upper cervical spine: Normal marrow signal. Sinuses/Orbits: Right sphenoid sinus retention cyst. Otherwise, largely clear sinuses. Unremarkable orbits. Other: No mastoid effusions. IMPRESSION: 1. Acute anterior right MCA territory infarct, predominantly involving the right caudate, putamen, anterior limb of the right internal capsule, insula, operculum and overlying right frontal lobe. A few punctate acute infarcts in the right parietal lobe. 2. Associated cytotoxic edema with slight (2-3 mm) leftward midline shift at the foramen of Monro. 3. Probable small  amount of hemorrhage in the posterior right frontal lobe/insular region. Electronically Signed   By: Feliberto Harts M.D.   On: 10/13/2020 14:38    PHYSICAL EXAM   Physical Exam  Constitutional: Obese young Caucasian lady not in distress. Psych: Affect appropriate to situation Eyes: No scleral injection HENT: No OP obstrucion MSK: no joint deformities.  Cardiovascular: Normal rate and regular rhythm.  Respiratory: Effort normal, non-labored breathing GI: Soft.  No distension. There is no tenderness.  Skin: WDI  Neuro: Mental Status: Patient  is awake, alert, oriented to person, place, month, year, and situation.  Patient is minimally dysarthric at time of exam, but is able to be understood in conversation.  No signs of aphasia or neglect   Cranial Nerves: II: Visual Fields are full. Pupils are equal, round, and reactive to light.    III,IV, VI: EOMI without ptosis or diploplia.  V: Facial sensation is asymmetric to temperature/ decreased sensation at left hemiface VII: Facial movement is asymmetric resting and smiling.  Mild left lower facial weakness VIII: Hearing is intact to voice X: Palate elevates symmetrically XI: Shoulder shrug is asymmetric. XII: Tongue protrudes midline without atrophy or fasciculations.  Motor: Tone is normal. Bulk is normal. 5/5 strength was present only at right arm. Right leg has persistent discomfort from groin punctures and she gently lifts off bed. Left leg is able to lift off bed transiently, but she has minimal movmeents to left arm.   Sensory: Sensation is asymmetric to light touch and temperature in the arms and legs.   Plantars: Toes are downgoing bilaterally.   Cerebellar: FNF and HKS are intact bilaterally   NIH stroke scale 7 Premorbid baseline modified Rankin scale 0  ASSESSMENT/PLAN Ms. Kristen Grant is a 25 y.o. female with history of anxiety, asthma, on birth control who presents to the ED via EMS as a Code Stroke after her boyfriend noted her to suddenly become weak on her left side shortly after waking up this morning. LKN was 0815 after waking. She was still lying in bed when her partner noticed that the left side of her face was drooping, she was drooling and the left side of her body was weak. On EMS arrival, they noted same. CBG was 106. On arrival to the ED, she had slight right gaze deviation, left facial droop, dysarthria and inability to move/lift her LUE and LLE. She was able to slowly answer questions. She was complaining of a right sided headache.   STAT CT head  revealed dense right M1 and M2 segments with equivocal early hypodensity in the right temporal lobe. she was taken on 9/9 for revascularization of right MCA then had reocclusion of same on 9/10 and underwent repeat angioplasty on 9/10. Developed persistent left hemiplegia. CTH on 9/10 post angioplasty showed possible small amount of subarachnoid hemorrhage versus contrast staining.   Plan for doppler BLE as well as TCD today PT/OT eval today. Consider transfer out of ICU when medically stable.   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  Status posttreatment with IV TNKase and mechanical thrombectomy initially on 10/11/2020  with TICI 3 revascularizationand requiring repeat thrombectomy 24 hours later for occluded dominant superior division of the right MCA achieving TICI2B -2C revascularizatiopn  Code Stroke  CT head hypodensity at right anterior temporal lobe.   ASPECTS 10.     CTA head & neck:   1. Thrombus in the distal right M1 segment  with near complete occlusion. There is reconstitution of flow in superior M2 division but no inferior M2 division is identified. 2. 11 cc infarct core in the right corona radiata, 75 cc penumbra. CT perfusion : CBF (<30%) Volume: 1mL Perfusion (Tmax>6.0s) volume: 27mL Mismatch Volume: 75mL Infarction Location:Right corona radiata Cerebral angio : report from Dr. Corliss Skains  bilateral common carotid and Lt VA angiograms followed by complete revascularization of occluded LT MCA M1 seg with x pass with contact aspiration and x1 pass with solitaire 98mm x 40 mm retriever and contact aspiration achieving a TICI 3 revascularization. Post CT brain No ICH . Hypercoagulable workup pending   MRI BRAIN:  1. Acute anterior right MCA territory infarct, predominantly involving the right caudate, putamen, anterior limb of the right internal capsule, insula, operculum and overlying right frontal lobe. A few punctate acute  infarcts in the right parietal lobe. 2. Associated cytotoxic edema with slight (2-3 mm) leftward midline shift at the foramen of Monro. 3. Probable small amount of hemorrhage in the posterior right frontal lobe/insular region. 2D Echo LVEF 60-65%, normal LA, poorly visualized interatrial septum  LDL 117 HgbA1c 5.0 VTE prophylaxis - scd    Diet   Diet regular Room service appropriate? Yes with Assist; Fluid consistency: Thin   No antithrombotic prior to admission, now on aspirin 81mg  daily Therapy recommendations:   pending Disposition:  likely home  Hyperlipidemia Home meds:   none LDL 117, goal < 70 Add  lipitor 80mg  daily   High intensity statin   Continue statin at discharge    HgbA1c 5.0, goal < 7.0 CBGs Recent Labs    10/12/20 2104 10/13/20 0313 10/13/20 1955  GLUCAP 106* 129* 95     SSI  Other Stroke Risk Factors  Birth control use   Substance abuse - UDS:  THC POSITIVE, Cocaine NONE DETECTED. Patient advised to stop using due to stroke risk.  Obesity, Body mass index is 29.92 kg/m., BMI >/= 30 associated with increased stroke risk, recommend weight loss, diet and exercise as appropriate   Family hx stroke (grandmother, multiple aunts)   Hospital day # 3  Continue close neurological monitoring and mobilize out of bed with therapy consults.  Discontinue arterial line.  Check transcranial Doppler bubble study for PFO and lower extremity venous Dopplers for DVT.  Check anticardiolipin antibodies.  Patient appears to be at risk for sleep apnea and may benefit with possible consideration of participation in sleep smart study.  She was given information to review and decide.  This patient is critically ill due to stroke post procedure and at significant risk of neurological worsening, death form severe anemia, bleeding, recurrent stroke, intracranial stenosis. This patient's care requires constant monitoring of vital signs, hemodynamics, respiratory and cardiac  monitoring, review of multiple databases, neurological assessment, other specialists and medical decision making of high complexity. I spent 45 minutes of neurocritical care time in the care of this patient   12/13/20 MD    To contact Stroke Continuity provider, please refer to 12/13/20. After hours, contact General Neurology

## 2020-10-14 NOTE — Progress Notes (Signed)
NAME:  Kristen Grant, MRN:  [id number], DOB:  12/10/1995, LOS: 3 ADMISSION DATE:  10/11/2020, CONSULTATION DATE:  10/12/2020 REFERRING MD:  Dr. Gerilyn Pilgrim, CHIEF COMPLAINT:  L M2 occlusion s/p thrombectomy   History of Present Illness:  Patient is a 25 yo F w/ pertinent PMH of asthma and migraine HA present to [facility] on 9/9 with L sided weakness (code stroke).  Patient suddenly became weak on left side soon after waking up on 9/9. LKN was 0815 am 9/9.  Partner noticed L sided facial drooping/drooling and her body was weak. EMS arrived and noticed slight R gaze deviation, left facial droop, dysarthria, and inability to move LUE/LLE. Patient complained of R sided HA.  Upon arrival 9/9 to [facility] ED, CT head showed R M1 and M2 segments w/ hypodensity in R temporal lobe. Patient risk factors are use of estrogenic progestogen birth control use and vaping. TNK was administered. Hypercoagulable panel ordered. Patient went to IR for b/l common carotid and Lt VA angiograms followed by complete revascularization of occluded LT MCA M1.   On 9/10 3:05 in the afternoon, patient had L hemiplegia, right gaze, and slurred speech. Neuro contacted and sent for repeat CTA which showed: showed proximal right M2 occlusion. Patient taken to IR for thrombectomy. Patient taken back to ICU extubated and on cleviprex.  PCCM consulted for icu medical management.      Significant Hospital Events: Including procedures, antibiotic start and stop dates in addition to other pertinent events   9/9: admitted to [facility] for R m1 and m2 occlusion 9/10: patient had another stroke and taken to IR for thrombectomy. On cleviprex  Interim History / Subjective:  9/12: doing well, tolerating diet. Off continuous infusions. Drowsy this am. Feels as though strength is 50%. C/o continued R anterior knee pain. No effusion felt and no posterior pain.   Objective   Blood pressure 112/66, pulse 66, temperature 98.7 F (37.1 C), temperature source  Oral, resp. rate 12, height 5\' 9"  (1.753 m), weight 91.9 kg, SpO2 100 %.        Intake/Output Summary (Last 24 hours) at 10/14/2020 0803 Last data filed at 10/14/2020 0600 Gross per 24 hour  Intake 448.88 ml  Output 1875 ml  Net -1426.12 ml   Filed Weights   10/11/20 0900 10/13/20 0600 10/14/20 0600  Weight: 94.6 kg 94.7 kg 91.9 kg    Examination: General: Young morbidly obese Caucasian female, lying on the bed HEENT: Atraumatic, normocephalic, moist mucous membranes, no JVD Neuro: Alert, awake, following simple commands.  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, s/p thrombectomy on 9/10 Continue neuro watch every hour MRI brain 9/11 with R MCA infarct, 2-28mm leftward midline shift and small amount of hemorrhage Neurology is  primary Continue secondary stroke prophylaxis, statin, asa Stroke work-up is pending SBP goal 120-140 Continue verapamil A1c is 5 PT/OT/SLP SCDs in place  Obesity Dietitian follow-up  Asthma, not in exacerbation Continue prn albuterol  Best Practice (right click and "Reselect all SmartList Selections" daily)   Diet/type: reg and thin DVT prophylaxis: SCD GI prophylaxis: PPI Lines:  Arterial Line Foley:  Yes, and it is still needed Code Status:  full code Last date of multidisciplinary goals of care discussion [per primary team] CCM will sign off at this time, pt stable for transfer from ICU from our perspective. Should further medical guidance be needed, please consult TRH.   Labs   CBC: Recent Labs  Lab 10/11/20 0936 10/11/20 0943  10/12/20 0018 10/13/20 0308  WBC 6.3  --  8.5 13.4*  NEUTROABS 3.0  --  7.4 11.2*  HGB 13.5 12.9 13.2 11.6*  HCT 40.4 38.0 39.3 34.4*  MCV 89.0  --  86.9 89.4  PLT 283  --  304 290    Basic Metabolic Panel: Recent Labs  Lab 10/11/20 0936 10/11/20 0943 10/12/20 0018 10/13/20 0308  NA 138 141 137 140  K 4.2 4.2 3.5 3.6  CL 110 109 107 112*  CO2 24  --  19* 21*  GLUCOSE 101* 100* 156* 139*  BUN 7 9 6 6   CREATININE 0.56 0.50 0.50 0.45  CALCIUM 8.8*  --  8.9 8.4*  MG  --   --   --  1.7   GFR: Estimated Creatinine Clearance: 129.8 mL/min (by C-G formula based on SCr of 0.45 mg/dL). Recent Labs  Lab 10/11/20 0936 10/12/20 0018 10/13/20 0308  WBC 6.3 8.5 13.4*    Liver Function Tests: Recent Labs  Lab 10/11/20 0936 10/13/20 0308  AST 20 14*  ALT 13 14  ALKPHOS 44 37*  BILITOT 0.8 1.3*  PROT 6.0* 5.7*  ALBUMIN 3.8 3.3*   No results for input(s): LIPASE, AMYLASE in the last 168 hours. No results for input(s): AMMONIA in the last 168 hours.  ABG    Component Value Date/Time   TCO2 25 10/11/2020 0943     Coagulation Profile: Recent Labs  Lab 10/11/20 0936  INR 1.0    Cardiac Enzymes: No results for input(s): CKTOTAL, CKMB, CKMBINDEX, TROPONINI in the last 168 hours.  HbA1C: Hgb A1c MFr Bld  Date/Time Value Ref Range Status  10/12/2020 12:18 AM 5.0 4.8 - 5.6 % Final    Comment:    (NOTE) Pre diabetes:          5.7%-6.4%  Diabetes:              >6.4%  Glycemic control for   <7.0% adults with diabetes     CBG: Recent Labs  Lab 10/11/20 0935 10/12/20 2104 10/13/20 0313 10/13/20 1955  GLUCAP 106* 106* 129* 95   Critical care time: The patient is critically ill with multiple organ systems failure and requires high complexity decision making for assessment and support, frequent evaluation and titration of therapies, application of advanced monitoring technologies and extensive interpretation of multiple databases.  Critical care time 33 mins.  This represents my time independent of the NPs time taking care of the pt. This is excluding procedures.    12/13/20 DO [facility] 10/14/2020, 8:04 AM See Amion for pager If no response to pager, please call [phone] until 1900 After 1900 please call [facility] [phone]

## 2020-10-14 NOTE — Progress Notes (Signed)
Physical Therapy Treatment Patient Details Name: Kristen Grant MRN: [id number] DOB: 29-Jun-1995 Today's Date: 10/14/2020   History of Present Illness Tht pt is a 25 yo female presenting 9/9 with c/o L sided weakness and facial droop. Pt s/p L MCA mechanical thrombectomy with revascularization on 9/9. 9/10 pt lethargic with left hemiplegia with additional CVA and return to IR for repeat thrombectomy. PMH includes: anxiety, migraines, birth control use, THC (+)    PT Comments    The pt is making good progress with OOB mobility and transfers at this time despite continued deficits in L-sided strength, postural stability and awareness, and endurance. The pt was able to demo good initiation of movement with BLE to complete movement to EOB, but required modA of 2 to safely progress to sitting EOB. The pt initially needed minA to steady in static sitting, but was able to progress to minG and then to standing with modA of 2. The pt also made good progress with ability to complete sit-stand transfers within the session, but continues to need at least minA of 2 with assist to manage L knee to avoid buckling/hyperextension. The pt continues to be a great candidate for CIR level therapies at this time.    Recommendations for follow up therapy are one component of a multi-disciplinary discharge planning process, led by the attending physician.  Recommendations may be updated based on patient status, additional functional criteria and insurance authorization.  Follow Up Recommendations  CIR     Equipment Recommendations  Other (comment) (defer to post acute)    Recommendations for Other Services       Precautions / Restrictions Precautions Precautions: Fall Precaution Comments: left hemiparesis, right groin pain Restrictions Weight Bearing Restrictions: No     Mobility  Bed Mobility Overal bed mobility: Needs Assistance Bed Mobility: Rolling;Sidelying to Sit;Sit to Supine Rolling: Mod  assist Sidelying to sit: Mod assist;+2 for physical assistance   Sit to supine: Mod assist;+2 for physical assistance   General bed mobility comments: repeated cues for each movement but pt able to complete movements with BLE to move to EOB, then needing modA of 2 to complete elevation of trunk from [facility]. minA initially for static sitting, pt then able to maintain withotu assist. modA of 2 to return to sitting. pt again needing repeated sequential cues for movement of each LE and repositioning    Transfers Overall transfer level: Needs assistance Equipment used: 2 person hand held assist Transfers: Sit to/from Stand Sit to Stand: Mod assist;Min assist;+2 physical assistance         General transfer comment: initially modA with L knee block, assist at hips to facilitate hip extension and to center balance (pt with L lateral lean). Pt then able to progress to +2 minA with assist to steady at L knee. pt able to demo good control with direct cues. completed x5 in session  Ambulation/Gait Ambulation/Gait assistance: Mod assist;+2 physical assistance Gait Distance (Feet): 3 Feet Assistive device: 2 person hand held assist Gait Pattern/deviations: Step-to pattern;Decreased stride length;Decreased weight shift to left Gait velocity: decreased   General Gait Details: pt unable to lift or advance LLE without assist to wt shift to R and to advance LLE. taking L lateral steps along EOB. pt needs L knee blocked with stance for safety, no instances of L knee buckling at this time      Modified Rankin (Stroke Patients Only) Modified Rankin (Stroke Patients Only) Pre-Morbid Rankin Score: No symptoms Modified Rankin: Severe disability  Balance Overall balance assessment: Needs assistance Sitting-balance support: Single extremity supported;Feet supported Sitting balance-Leahy Scale: Poor Sitting balance - Comments: minA initially with progression to minG with pt resting BUE on her  knees Postural control: Left lateral lean;Posterior lean Standing balance support: Bilateral upper extremity supported Standing balance-Leahy Scale: Poor Standing balance comment: minA to L hip to improve neutral stance rather than L lean. pt able to wt shift back and forth but unable to maintain midline without verbal or tactile cues                            Cognition Arousal/Alertness: Awake/alert Behavior During Therapy: Flat affect Overall Cognitive Status: Within Functional Limits for tasks assessed                                 General Comments: pt with slight continued anxiety, but mostly flat affect at this time. needing repeated cues to complete tasks at times.      Exercises Other Exercises Other Exercises: partial squat with +1 assist and +1 managing L knee to prevent buckling and hyperextension. pt able to demo improved control with reps, needing cues for knee extension to achieve full upright standing. x10 through session Other Exercises: pt educated on cervical rotation and stretching to include: lateral rotation, lateral flexion, head circles, and shoulder circles as pt c/o significant neck stiffness    General Comments General comments (skin integrity, edema, etc.): VSS on RA      Pertinent Vitals/Pain Pain Assessment: Faces Faces Pain Scale: Hurts a little bit Pain Location: R groin, headache, back and neck (stiff) Pain Descriptors / Indicators: Aching;Guarding;Discomfort Pain Intervention(s): Limited activity within patient's tolerance;Monitored during session;Repositioned     PT Goals (current goals can now be found in the care plan section) Acute Rehab PT Goals Patient Stated Goal: be able to walk and go home PT Goal Formulation: With patient/family Time For Goal Achievement: 10/27/20 Potential to Achieve Goals: Fair Progress towards PT goals: Progressing toward goals    Frequency    Min 4X/week      PT Plan Current  plan remains appropriate    Co-evaluation PT/OT/SLP Co-Evaluation/Treatment: Yes Reason for Co-Treatment: Complexity of the patient's impairments (multi-system involvement);Necessary to address cognition/behavior during functional activity;For patient/therapist safety;To address functional/ADL transfers PT goals addressed during session: Mobility/safety with mobility;Balance;Strengthening/ROM        AM-PAC PT "6 Clicks" Mobility   Outcome Measure  Help needed turning from your back to your side while in a flat bed without using bedrails?: A Lot Help needed moving from lying on your back to sitting on the side of a flat bed without using bedrails?: A Lot Help needed moving to and from a bed to a chair (including a wheelchair)?: A Lot Help needed standing up from a chair using your arms (e.g., wheelchair or bedside chair)?: A Lot Help needed to walk in hospital room?: Total Help needed climbing 3-5 steps with a railing? : Total 6 Click Score: 10    End of Session Equipment Utilized During Treatment: Gait belt Activity Tolerance: Patient tolerated treatment well Patient left: in bed;with call bell/phone within reach;with family/visitor present;with nursing/sitter in room Nurse Communication: Mobility status PT Visit Diagnosis: Other abnormalities of gait and mobility (R26.89);Difficulty in walking, not elsewhere classified (R26.2);Other symptoms and signs involving the nervous system (R29.898);Hemiplegia and hemiparesis Hemiplegia - Right/Left: Left Hemiplegia - dominant/non-dominant: Non-dominant  Hemiplegia - caused by: Cerebral infarction     Time: 6381-7711 PT Time Calculation (min) (ACUTE ONLY): 32 min  Charges:  $Therapeutic Activity: 8-22 mins                     Vickki Muff, PT, DPT   Acute Rehabilitation Department Pager #: [phone]   Ronnie Derby 10/14/2020, 4:40 PM

## 2020-10-15 DIAGNOSIS — I639 Cerebral infarction, unspecified: Secondary | ICD-10-CM | POA: Diagnosis not present

## 2020-10-15 LAB — LUPUS ANTICOAGULANT PANEL
DRVVT: 35.9 s (ref 0.0–47.0)
PTT Lupus Anticoagulant: 31.8 s (ref 0.0–51.9)

## 2020-10-15 LAB — PROTEIN S ACTIVITY: Protein S Activity: 111 % (ref 63–140)

## 2020-10-15 LAB — PROTEIN S, TOTAL: Protein S Ag, Total: 102 % (ref 60–150)

## 2020-10-15 LAB — BETA-2-GLYCOPROTEIN I ABS, IGG/M/A
Beta-2 Glyco I IgG: <9 GPI IgG units (ref 0–20)
Beta-2-Glycoprotein I IgA: <9 GPI IgA units (ref 0–25)
Beta-2-Glycoprotein I IgM: <9 GPI IgM units (ref 0–32)

## 2020-10-15 LAB — PROTEIN C ACTIVITY: Protein C Activity: 128 % (ref 73–180)

## 2020-10-15 MED ORDER — TOPIRAMATE 25 MG PO TABS
50.0000 mg | ORAL_TABLET | Freq: Two times a day (BID) | ORAL | Status: AC
  Administered 2020-10-15 – 2020-10-16 (×4): 50 mg via ORAL
  Filled 2020-10-15 (×4): qty 2

## 2020-10-15 NOTE — Progress Notes (Signed)
Referring Physician(s): Dr. Otelia Limes   Supervising Physician: Julieanne Cotton  Patient Status:  [facility] [place] - In-pt  Chief Complaint:  CVA R MCA revascularization in IR 9/9 Repeat angioplasty 9/10  Subjective: Patient in bed, awake and alert. PT at the bedside to work with patient. She remains weak on the left side but she states it's slightly better today. Mother at the bedside.   Allergies: Patient has no known allergies.  Medications: Prior to Admission medications   Medication Sig Start Date End Date Taking? Authorizing Provider  medroxyPROGESTERone (DEPO-PROVERA) 150 MG/ML injection Inject 1 mL (150 mg total) into the muscle every 3 (three) months. 12/06/19 10/12/20 Yes Larene Pickett, FNP  norethindrone (MICRONOR) 0.35 MG tablet Take 1 tablet (0.35 mg total) by mouth daily for 28 days. Patient not taking: No sig reported 10/06/19 11/03/19  Alberteen Spindle, CNM     Vital Signs: BP (!) 110/58   Pulse 67   Temp 98.9 F (37.2 C) (Axillary)   Resp 14   Ht 5\' 9"  (1.753 m)   Wt 202 lb 9.6 oz (91.9 kg)   SpO2 98%   BMI 29.92 kg/m   Physical Exam NAD, alert Neuro: Tongue midline, EOMs intact, Mild left facial droop.  Can lift left arm off the bed and extend fingers with effort.  Decreased hand drip strength on left. Can bend left and right knee.  Lifts left leg off the bed.  Skin: Rt groin soft - no bleeding; no hematoma. Skin color appropriate for ethnicity. No signs of mottling or vasculitis.  Rt foot 2 + pulses  Imaging: CT HEAD WO CONTRAST ( )  Result Date: 10/12/2020 CLINICAL DATA:  Follow-up examination for acute stroke, status post thrombectomy. EXAM: CT HEAD WITHOUT CONTRAST TECHNIQUE: Contiguous axial images were obtained from the base of the skull through the vertex without intravenous contrast. COMPARISON:  Prior CTA from earlier the same day. FINDINGS: Brain: Loss of gray-white matter differentiation involving the right insular region as well as the overlying  right frontal operculum. Fogging at the right caudate and lentiform nuclei present as well. Findings consistent with evolving right MCA distribution infarct. Superimposed hyperdensity at the level of the right sylvian fissure could reflect contrast staining or possibly small amount of subarachnoid hemorrhage (series 2, image 20). No significant mass effect at this time. Remainder the brain is otherwise normal in appearance. No other hemorrhage or acute large vessel territory infarct. No mass lesion or midline shift. No hydrocephalus. No extra-axial fluid collection. Vascular: No visible hyperdense vessel. Skull: Scalp soft tissues and calvarium demonstrate no new abnormality. Sinuses/Orbits: Globes and orbital soft tissues within normal limits. Scattered mucosal thickening in opacity present within the sphenoid ethmoidal and maxillary sinuses with a few scattered retention cyst. Mastoid air cells are clear. Other: None. IMPRESSION: 1. Evolving acute right MCA distribution infarct. Superimposed hyperdensity at the level of the right Sylvian fissure could reflect contrast staining or possibly small amount of subarachnoid hemorrhage. Short interval follow-up CT suggested. No significant mass effect at this time. 2. No other new acute intracranial abnormality. These results were communicated to Dr. 12/12/2020 at 8:55 pm on 10/12/2020 by text page via the [facility] messaging system. Electronically Signed   By: [facility] M.D.   On: 10/12/2020 20:56   MR BRAIN WO CONTRAST  Result Date: 10/13/2020 CLINICAL DATA:  Stroke, follow up EXAM: MRI HEAD WITHOUT CONTRAST TECHNIQUE: Multiplanar, multiecho pulse sequences of the brain and surrounding structures were obtained without intravenous contrast. COMPARISON:  CT  head 10/12/2020. FINDINGS: Brain: Acute infarct involving the anterior right MCA territory, predominantly including the caudate, putamen, anterior limb of the right internal capsule, insula, operculum and  overlying right frontal lobe. A few punctate acute infarcts in the right parietal lobe. Associated cytotoxic edema with slight (2-3 mm) leftward midline shift at the foramen of Monro. Small amount of susceptibility artifact in the posterior right frontal lobe/insular region, suggestive of small volume hemorrhage. No hydrocephalus. No extra-axial fluid collection. Vascular: Major arterial flow voids are maintained at the skull base. Skull and upper cervical spine: Normal marrow signal. Sinuses/Orbits: Right sphenoid sinus retention cyst. Otherwise, largely clear sinuses. Unremarkable orbits. Other: No mastoid effusions. IMPRESSION: 1. Acute anterior right MCA territory infarct, predominantly involving the right caudate, putamen, anterior limb of the right internal capsule, insula, operculum and overlying right frontal lobe. A few punctate acute infarcts in the right parietal lobe. 2. Associated cytotoxic edema with slight (2-3 mm) leftward midline shift at the foramen of Monro. 3. Probable small amount of hemorrhage in the posterior right frontal lobe/insular region. Electronically Signed   By: Feliberto Harts M.D.   On: 10/13/2020 14:38   VAS [place] TRANSCRANIAL DOPPLER W BUBBLES  Result Date: 10/15/2020  Transcranial Doppler with Bubble Patient Name:  TIAN Grant  Date of Exam:   10/14/2020 Medical Rec #: [id number]       Accession #:    [id number] Date of Birth: 31-Oct-1995        Patient Gender: F Patient Age:   25 years Exam Location:  [facility] Procedure:      VAS [place] TRANSCRANIAL DOPPLER W BUBBLES Referring Phys: PRAMOD SETHI --------------------------------------------------------------------------------  Indications: Stroke. Performing Technologist: [place] Ponder RVS  Examination Guidelines: A complete evaluation includes B-mode imaging, spectral Doppler, color Doppler, and power Doppler as needed of all accessible portions of each vessel. Bilateral testing is considered an integral part of a  complete examination. Limited examinations for reoccurring indications may be performed as noted.  Summary:  A vascular evaluation was performed. The right middle cerebral artery was studied. An IV was inserted into the patient's right forearm . Verbal informed consent was obtained.  HITS heard at rest: spencer 3 HITS heard during valsalva: Partial curtain Positive TCD Bubble study indicative of right to left medium size shunt at rest and with valsalva *See table(s) above for TCD measurements and observations.  Diagnosing physician: Delia Heady MD Electronically signed by Delia Heady MD on 10/15/2020 at 12:47:16 PM.    Final    ECHOCARDIOGRAM COMPLETE  Result Date: 10/11/2020    ECHOCARDIOGRAM REPORT   Patient Name:   TU SHIMMEL Date of Exam: 10/11/2020 Medical Rec #:  [id number]      Height:       67.0 in Accession #:    [id number]     Weight:       208.6 lb Date of Birth:  04/07/1995       BSA:          2.059 m Patient Age:    25 years       BP:           124/76 mmHg Patient Gender: F              HR:           76 bpm. Exam Location:  Inpatient Procedure: 2D Echo Indications:    stroke  History:        Patient has no prior history of Echocardiogram  examinations.  Sonographer:    Delcie Roch RDCS Referring Phys: [phone] ERIC LINDZEN IMPRESSIONS  1. Left ventricular ejection fraction, by estimation, is 60 to 65%. The left ventricle has normal function. The left ventricle has no regional wall motion abnormalities. Left ventricular diastolic parameters were normal.  2. Right ventricular systolic function is normal. The right ventricular size is normal. Tricuspid regurgitation signal is inadequate for assessing PA pressure.  3. The mitral valve is normal in structure. No evidence of mitral valve regurgitation. No evidence of mitral stenosis.  4. The aortic valve was not well visualized. Aortic valve regurgitation is not visualized. No aortic stenosis is present. Conclusion(s)/Recommendation(s): No intracardiac  source of embolism detected on this transthoracic study. A transesophageal echocardiogram is recommended to exclude cardiac source of embolism if clinically indicated. FINDINGS  Left Ventricle: Left ventricular ejection fraction, by estimation, is 60 to 65%. The left ventricle has normal function. The left ventricle has no regional wall motion abnormalities. The left ventricular internal cavity size was normal in size. There is  no left ventricular hypertrophy. Left ventricular diastolic parameters were normal. Right Ventricle: The right ventricular size is normal. No increase in right ventricular wall thickness. Right ventricular systolic function is normal. Tricuspid regurgitation signal is inadequate for assessing PA pressure. Left Atrium: Left atrial size was normal in size. Right Atrium: Right atrial size was normal in size. Pericardium: There is no evidence of pericardial effusion. Mitral Valve: The mitral valve is normal in structure. No evidence of mitral valve regurgitation. No evidence of mitral valve stenosis. Tricuspid Valve: The tricuspid valve is normal in structure. Tricuspid valve regurgitation is trivial. Aortic Valve: The aortic valve was not well visualized. Aortic valve regurgitation is not visualized. No aortic stenosis is present. Pulmonic Valve: The pulmonic valve was not well visualized. Pulmonic valve regurgitation is not visualized. Aorta: The aortic root and ascending aorta are structurally normal, with no evidence of dilitation. IAS/Shunts: The interatrial septum was not well visualized.  LEFT VENTRICLE PLAX 2D LVIDd:         4.50 cm  Diastology LVIDs:         2.90 cm  LV e' medial:    10.60 cm/s LV PW:         0.90 cm  LV E/e' medial:  7.6 LV IVS:        0.80 cm  LV e' lateral:   15.90 cm/s LVOT diam:     1.90 cm  LV E/e' lateral: 5.0 LV SV:         62 LV SV Index:   30 LVOT Area:     2.84 cm  RIGHT VENTRICLE             IVC RV S prime:     12.90 cm/s  IVC diam: 1.10 cm TAPSE (M-mode):  2.3 cm LEFT ATRIUM             Index       RIGHT ATRIUM          Index LA diam:        3.20 cm 1.55 cm/m  RA Area:     9.88 cm LA Vol (A2C):   31.7 ml 15.40 ml/m RA Volume:   19.60 ml 9.52 ml/m LA Vol (A4C):   22.5 ml 10.93 ml/m LA Biplane Vol: 26.8 ml 13.02 ml/m  AORTIC VALVE LVOT Vmax:   126.00 cm/s LVOT Vmean:  81.500 cm/s LVOT VTI:    0.217 m  AORTA Ao Root diam:  2.80 cm Ao Asc diam:  2.60 cm MITRAL VALVE MV Area (PHT): 4.49 cm    SHUNTS MV Decel Time: 169 msec    Systemic VTI:  0.22 m MV E velocity: 80.10 cm/s  Systemic Diam: 1.90 cm MV A velocity: 51.80 cm/s MV E/A ratio:  1.55 Epifanio Lesches MD Electronically signed by Epifanio Lesches MD Signature Date/Time: 10/11/2020/10:54:58 PM    Final    VAS [place] LOWER EXTREMITY VENOUS (DVT)  Result Date: 10/14/2020  Lower Venous DVT Study Patient Name:  RYIAN LYNDE  Date of Exam:   10/14/2020 Medical Rec #: [id number]       Accession #:    [id number] Date of Birth: [birth year]        Patient Gender: F Patient Age:   25 years Exam Location:  [facility] Procedure:      VAS [place] LOWER EXTREMITY VENOUS (DVT) Referring Phys: Hilda Lias FRANCOIS --------------------------------------------------------------------------------  Indications: Stroke.  Limitations: Bandages. Comparison Study: no prior Performing Technologist: [place] Ponder RVS  Examination Guidelines: A complete evaluation includes B-mode imaging, spectral Doppler, color Doppler, and power Doppler as needed of all accessible portions of each vessel. Bilateral testing is considered an integral part of a complete examination. Limited examinations for reoccurring indications may be performed as noted. The reflux portion of the exam is performed with the patient in reverse Trendelenburg.  +---------+---------------+---------+-----------+----------+-------------------+ RIGHT    CompressibilityPhasicitySpontaneityPropertiesThrombus Aging       +---------+---------------+---------+-----------+----------+-------------------+ CFV                                                   not visualized due                                                        to bandages         +---------+---------------+---------+-----------+----------+-------------------+ FV Prox  Full                                                             +---------+---------------+---------+-----------+----------+-------------------+ FV Mid   Full                                                             +---------+---------------+---------+-----------+----------+-------------------+ FV DistalFull                                                             +---------+---------------+---------+-----------+----------+-------------------+ PFV      Full                                                             +---------+---------------+---------+-----------+----------+-------------------+  POP      Full           Yes      Yes                                      +---------+---------------+---------+-----------+----------+-------------------+ PTV      Full                                                             +---------+---------------+---------+-----------+----------+-------------------+ PERO     Full                                                             +---------+---------------+---------+-----------+----------+-------------------+   +---------+---------------+---------+-----------+----------+--------------+ LEFT     CompressibilityPhasicitySpontaneityPropertiesThrombus Aging +---------+---------------+---------+-----------+----------+--------------+ CFV      Full           Yes      Yes                                 +---------+---------------+---------+-----------+----------+--------------+ SFJ      Full                                                         +---------+---------------+---------+-----------+----------+--------------+ FV Prox  Full                                                        +---------+---------------+---------+-----------+----------+--------------+ FV Mid   Full                                                        +---------+---------------+---------+-----------+----------+--------------+ FV DistalFull                                                        +---------+---------------+---------+-----------+----------+--------------+ PFV      Full                                                        +---------+---------------+---------+-----------+----------+--------------+ POP      Full           Yes      Yes                                 +---------+---------------+---------+-----------+----------+--------------+  PTV      Full                                                        +---------+---------------+---------+-----------+----------+--------------+ PERO     Full                                                        +---------+---------------+---------+-----------+----------+--------------+     Summary: BILATERAL: - No evidence of deep vein thrombosis seen in the lower extremities, bilaterally. -No evidence of popliteal cyst, bilaterally.   *See table(s) above for measurements and observations. Electronically signed by Lemar Livings MD on 10/14/2020 at 11:00:27 PM.    Final    CT ANGIO HEAD NECK W WO CM W PERF (CODE STROKE)  Result Date: 10/12/2020 CLINICAL DATA:  Neuro deficit, acute, stroke suspected EXAM: CT ANGIOGRAPHY HEAD AND NECK CT PERFUSION BRAIN TECHNIQUE: Multidetector CT imaging of the head and neck was performed using the standard protocol during bolus administration of intravenous contrast. Multiplanar CT image reconstructions and MIPs were obtained to evaluate the vascular anatomy. Carotid stenosis measurements (when applicable) are obtained utilizing NASCET criteria,  using the distal internal carotid diameter as the denominator. Multiphase CT imaging of the brain was performed following IV bolus contrast injection. Subsequent parametric perfusion maps were calculated using RAPID software. CONTRAST:  OMNIPAQUE IOHEXOL 350 MG/ML SOLN COMPARISON:  10/11/2020. FINDINGS: CT HEAD FINDINGS Brain: Hypoattenuation and loss of gray-white differentiation in the right MCA territory, including the right frontal lobe, right insula, internal capsule right lentiform nucleus, caudate, and inferior right frontal cortex/operculum. Slight midline shift. No acute hemorrhage, hydrocephalus, mass lesion, or visible extra-axial fluid collection. ASPECTS [facility] Stroke Program Early CT Score) - Ganglionic level infarction (caudate, lentiform nuclei, internal capsule, insula, M1-M3 cortex): 1 - Supraganglionic infarction (M4-M6 cortex): 3 Total score (0-10 with 10 being normal): 4 Vascular: See below. Skull: No acute fracture. Sinuses: No acute findings. Orbits: No acute findings. Review of the MIP images confirms the above findings CTA NECK FINDINGS Aortic arch: Great vessel origins are patent. Right carotid system: No evidence of dissection, stenosis (50% or greater) or occlusion. Left carotid system: No evidence of dissection, stenosis (50% or greater) or occlusion. Vertebral arteries: Mildly left dominant. No significant (greater than 50%) stenosis. Skeleton: No acute abnormality. Other neck: No acute abnormality. CTA HEAD FINDINGS Anterior circulation: Bilateral ICAs and M1 MCAs are patent. Occlusion a of an anterior right M2 MCA branch (see series 11, images 237 through 240). Bilateral ACAs are patent. Posterior circulation: Bilateral intradural vertebral arteries basilar artery and posterior cerebral arteries are patent without proximal high-grade stenosis. Venous: No evidence of dural sinus thrombosis. CT Brain Perfusion Findings: ASPECTS: 4 CBF (<30%) Volume: 67mL Perfusion (Tmax>6.0s)  volume: 17mL Mismatch Volume: 81mL Infarction Location:Right MCA territory IMPRESSION: CT Head: 1. 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 ASPECTS is 4. Slight leftward midline shift. 2. No acute hemorrhage. CTA: 1. Proximal right M2 MCA occlusion. 2. No significant stenosis in the neck. CT Perfusion: Large area of core infarct in the right  MCA territory, measuring at least 55 mL and likely underestimated given superior extent is not imaged. Small (8 mL) area of ischemic penumbra. Findings discussed with Dr. Otelia Limes via telephone at 4:00 PM Electronically Signed   By: Feliberto Harts M.D.   On: 10/12/2020 16:26    Labs:  CBC: Recent Labs    10/11/20 0936 10/11/20 0943 10/12/20 0018 10/13/20 0308  WBC 6.3  --  8.5 13.4*  HGB 13.5 12.9 13.2 11.6*  HCT 40.4 38.0 39.3 34.4*  PLT 283  --  304 290    COAGS: Recent Labs    10/11/20 0936  INR 1.0  APTT 27    BMP: Recent Labs    10/11/20 0936 10/11/20 0943 10/12/20 0018 10/13/20 0308  NA 138 141 137 140  K 4.2 4.2 3.5 3.6  CL 110 109 107 112*  CO2 24  --  19* 21*  GLUCOSE 101* 100* 156* 139*  BUN 7 9 6 6   CALCIUM 8.8*  --  8.9 8.4*  CREATININE 0.56 0.50 0.50 0.45  GFRNONAA >60  --  >60 >60    LIVER FUNCTION TESTS: Recent Labs    10/11/20 0936 10/13/20 0308  BILITOT 0.8 1.3*  AST 20 14*  ALT 13 14  ALKPHOS 44 37*  PROT 6.0* 5.7*  ALBUMIN 3.8 3.3*    Assessment and Plan:  Right MCA occlusion s/p revascularization 9/9 with thrombectomy Reocclusion, repeat thrombectomy 9/10 after worsening left-sided weakness  Patient continues to improve. Right groin vascular site is clean/dry and soft. Mild tenderness to palpation. TCD bubble study done yesterday was positive for medium size right-to-left shunt. Lower extremity venous dopplers negative for DVT. Hypercoagulable work up is pending. TEE planned  for 10/17/20.   Ok to remove right groin dressing. Other plans per primary teams. Please call IR with any questions.    Electronically Signed: Alwyn Ren, AGACNP-BC [phone] 10/15/2020, 3:51 PM   I spent a total of 15 Minutes at the the patient's bedside AND on the patient's hospital floor or unit, greater than 50% of which was counseling/coordinating care for post-revascularization care.

## 2020-10-15 NOTE — Progress Notes (Signed)
SLP Cancellation Note  Patient Details Name: DEZARAY SHIBUYA MRN: [id number] DOB: Jan 07, 1996   Cancelled treatment:       Reason Eval/Treat Not Completed: Pain limiting ability to participate. Arrived to complete cognitive linguistic eval, pt requested deferral due to headache. Will f/u as able, but based on screen, pt presents with cognitive/speech impairment and would benefit from f/u at intensive rehabilitation. Recommend CIR where full cognitive eval can be completed if not done acutely.    Aariya Ferrick, Riley Nearing 10/15/2020, 8:58 AM

## 2020-10-15 NOTE — H&P (Signed)
Physical Medicine and Rehabilitation Admission H&P    Chief Complaint  Patient presents with   Code Stroke  : HPI: Kristen Grant is a 25 year old right-handed female with morbid obesity BMI 29.92, anxiety, migraine headaches, tobacco use.  Per chart review patient independent prior to admission working full-time.  She lives with her boyfriend and 7-year-old son.  Mother with good support.  Presented 10/11/2020 with acute onset of left-sided weakness, dysarthria and facial droop.  Cranial CT scan showed hypodensity in the right anterior temporal lobe concerning for infarct.  CT angiogram head and neck thrombus in the distal right M1 segment with near complete occlusion.  11 cc infarct core in the right corona radiata.  Admission chemistries alcohol negative, WBC 11,300, glucose 101, urine drug screen positive marijuana as well as benzos.  Patient underwent mechanical thrombectomy 10/11/2020 with revascularization per interventional radiology requiring repeat thrombectomy 24 hours later for occluded dominant superior division of the right MCA achieving revascularization.  Latest MRI and imaging 10/13/2020 shows acute anterior right MCA territory infarct predominantly involving the right caudate putamen and anterior limb of the right internal capsule, insula, operculum and overlying right frontal lobe.  A few punctate acute infarcts in the right parietal lobe.  Associated cytotoxic edema with slight 2-3 mm leftward midline shift at the foramen of Monro.  Probable small amount of hemorrhage in the posterior right frontal lobe/insular region.  Echocardiogram with ejection fraction of 60 to 65% no wall motion abnormalities.  Hyper coag work-up negative.  ANA and RPR negative.  TEE is pending.  Patient was cleared to begin aspirin for CVA prophylaxis.  Lower extremity Dopplers negative for DVT.  Cleviprex initiated for blood pressure control.  Tolerating a regular diet.  Therapy evaluations completed due to  patient's left-sided weakness and dysarthria was admitted for a comprehensive rehab program.  Review of Systems  Constitutional:  Negative for chills and fever.  HENT:  Negative for hearing loss.   Eyes:  Negative for blurred vision and double vision.  Respiratory:  Negative for cough and shortness of breath.   Cardiovascular:  Negative for chest pain, palpitations and leg swelling.  Gastrointestinal:  Positive for constipation. Negative for heartburn, nausea and vomiting.  Genitourinary:  Negative for dysuria, flank pain and hematuria.  Skin:  Negative for rash.  Neurological:  Positive for speech change, weakness and headaches.  Psychiatric/Behavioral:  The patient has insomnia.        Anxiety  All other systems reviewed and are negative. Past Medical History:  Diagnosis Date   Anxiety    Asthma    no treatments required at this time   Migraine headache    Past Surgical History:  Procedure Laterality Date   NO PAST SURGERIES     RADIOLOGY WITH ANESTHESIA N/A 10/12/2020   Procedure: RADIOLOGY WITH ANESTHESIA;  Surgeon: Radiologist, Medication, MD;  Location: [facility] OR;  Service: Radiology;  Laterality: N/A;   RADIOLOGY WITH ANESTHESIA N/A 10/11/2020   Procedure: IR WITH ANESTHESIA;  Surgeon: Radiologist, Medication, MD;  Location: [facility] OR;  Service: Radiology;  Laterality: N/A;   Family History  Problem Relation Age of Onset   Hypertension Mother    Epilepsy Mother    Anxiety disorder Mother    Depression Mother    Migraines Mother    Diabetes Maternal Grandmother    Stroke Maternal Grandmother    Hypertension Maternal Grandmother    Breast cancer Neg Hx    Social History:  reports that she has been  smoking e-cigarettes. She started smoking about 20 months ago. She has never used smokeless tobacco. She reports that she does not currently use alcohol. She reports current drug use. Frequency: 3.00 times per week. Drug: Marijuana. Allergies: No Known Allergies Medications Prior to  Admission  Medication Sig Dispense Refill   medroxyPROGESTERone (DEPO-PROVERA) 150 MG/ML injection Inject 1 mL (150 mg total) into the muscle every 3 (three) months. 1 mL 0   norethindrone (MICRONOR) 0.35 MG tablet Take 1 tablet (0.35 mg total) by mouth daily for 28 days. (Patient not taking: No sig reported) 28 tablet 12    Drug Regimen Review Drug regimen was reviewed and remains appropriate with no significant issues identified  Home: Home Living Family/patient expects to be discharged to:: Private residence Living Arrangements: Children, Spouse/significant other Available Help at Discharge: Family, Available 24 hours/day Type of Home: House Home Access: Stairs to enter [employer] of Steps: 2-3 Entrance Stairs-Rails: None Home Layout: One level Bathroom Shower/Tub: Engineer, manufacturing systems: Standard Bathroom Accessibility: Yes Home Equipment: None  Lives With: Son, Significant other   Functional History: Prior Function Level of Independence: Independent Comments: 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 Needs to be on her feet all day at either place. 5 yo son who just started school  Functional Status:  Mobility: Bed Mobility Overal bed mobility: Needs Assistance Bed Mobility: Rolling, Sidelying to Sit, Sit to Supine Rolling: Mod assist Sidelying to sit: Mod assist, +2 for physical assistance Sit to supine: Mod assist, +2 for physical assistance General bed mobility comments: repeated cues for each movement but pt able to complete movements with BLE to move to EOB, then needing modA of 2 to complete elevation of trunk from [facility]. minA initially for static sitting, pt then able to maintain withotu assist. modA of 2 to return to sitting. pt again needing repeated sequential cues for movement of each LE and repositioning Transfers Overall transfer level: Needs assistance Equipment used: 2 person hand held  assist Transfers: Sit to/from Stand Sit to Stand: Mod assist, Min assist, +2 physical assistance General transfer comment: initially modA with L knee block, assist at hips to facilitate hip extension and to center balance (pt with L lateral lean). Pt then able to progress to +2 minA with assist to steady at L knee. pt able to demo good control with direct cues. completed x5 in session Ambulation/Gait Ambulation/Gait assistance: Mod assist, +2 physical assistance Gait Distance (Feet): 3 Feet Assistive device: 2 person hand held assist Gait Pattern/deviations: Step-to pattern, Decreased stride length, Decreased weight shift to left General Gait Details: pt unable to lift or advance LLE without assist to wt shift to R and to advance LLE. taking L lateral steps along EOB. pt needs L knee blocked with stance for safety, no instances of L knee buckling at this time Gait velocity: decreased    ADL: ADL Overall ADL's : Needs assistance/impaired Eating/Feeding: Minimal assistance, Sitting Grooming: Minimal assistance, Sitting Upper Body Bathing: Moderate assistance, Sitting Lower Body Bathing: Total assistance, +2 for physical assistance, Sit to/from stand Upper Body Dressing : Moderate assistance, Sitting Lower Body Dressing: Total assistance, +2 for physical assistance, Sit to/from stand Toileting- Architect and Hygiene: Total assistance, +2 for physical assistance, Bed level  Cognition: Cognition Overall Cognitive Status: Within Functional Limits for tasks assessed Orientation Level: Oriented X4 Cognition Arousal/Alertness: Awake/alert Behavior During Therapy: Flat affect Overall Cognitive Status: Within Functional Limits for tasks assessed General Comments: pt  with slight continued anxiety, but mostly flat affect at this time. needing repeated cues to complete tasks at times.  Physical Exam: Blood pressure 105/61, pulse 77, temperature 98.7 F (37.1 C), temperature source  Oral, resp. rate 10, height 5\' 9"  (1.753 m), weight 91.9 kg, SpO2 98 %. Physical Exam Constitutional:      Appearance: Normal appearance.  HENT:     Head: Normocephalic and atraumatic.     Nose: Nose normal.     Mouth/Throat:     Mouth: Mucous membranes are moist.     Pharynx: Oropharynx is clear.  Eyes:     Extraocular Movements: Extraocular movements intact.     Pupils: Pupils are equal, round, and reactive to light.  Cardiovascular:     Rate and Rhythm: Normal rate and regular rhythm.     Pulses: Normal pulses.  Pulmonary:     Effort: Pulmonary effort is normal. No respiratory distress.     Breath sounds: No wheezing.  Abdominal:     General: Bowel sounds are normal. There is no distension.     Palpations: Abdomen is soft.  Musculoskeletal:        General: No swelling or deformity. Normal range of motion.     Cervical back: Normal range of motion.  Skin:    General: Skin is warm and dry.  Neurological:     Mental Status: She is alert.     Comments: Patient is alert.  Makes eye contact with examiner.  Fair insight and awareness.  Provides name and age.  Speech is mildly dysarthric. Mild left central 7. Fair insight and awareness. Mild left inattention. Senses pain and light touch in all 4. LUE 1 to 1+/5 prox to distal. LLE 3- to 3/5 prox to distal. No resting tone. DTR's 1+  Psychiatric:     Comments: Pt cooperative but anxious.     Results for orders placed or performed during the hospital encounter of 10/11/20 (from the past 48 hour(s))  Glucose, capillary     Status: None   Collection Time: 10/13/20  7:55 PM  Result Value Ref Range   Glucose-Capillary 95 70 - 99 mg/dL    Comment: Glucose reference range applies only to samples taken after fasting for at least 8 hours.   MR BRAIN WO CONTRAST  Result Date: 10/13/2020 CLINICAL DATA:  Stroke, follow up EXAM: MRI HEAD WITHOUT CONTRAST TECHNIQUE: Multiplanar, multiecho pulse sequences of the brain and surrounding structures  were obtained without intravenous contrast. COMPARISON:  CT head 10/12/2020. FINDINGS: Brain: Acute infarct involving the anterior right MCA territory, predominantly including the caudate, putamen, anterior limb of the right internal capsule, insula, operculum and overlying right frontal lobe. A few punctate acute infarcts in the right parietal lobe. Associated cytotoxic edema with slight (2-3 mm) leftward midline shift at the foramen of Monro. Small amount of susceptibility artifact in the posterior right frontal lobe/insular region, suggestive of small volume hemorrhage. No hydrocephalus. No extra-axial fluid collection. Vascular: Major arterial flow voids are maintained at the skull base. Skull and upper cervical spine: Normal marrow signal. Sinuses/Orbits: Right sphenoid sinus retention cyst. Otherwise, largely clear sinuses. Unremarkable orbits. Other: No mastoid effusions. IMPRESSION: 1. Acute anterior right MCA territory infarct, predominantly involving the right caudate, putamen, anterior limb of the right internal capsule, insula, operculum and overlying right frontal lobe. A few punctate acute infarcts in the right parietal lobe. 2. Associated cytotoxic edema with slight (2-3 mm) leftward midline shift at the foramen of Monro. 3. Probable small  amount of hemorrhage in the posterior right frontal lobe/insular region. Electronically Signed   By: Feliberto Harts M.D.   On: 10/13/2020 14:38   VAS [place] TRANSCRANIAL DOPPLER W BUBBLES  Result Date: 10/14/2020  Transcranial Doppler with Bubble Patient Name:  Kristen Grant  Date of Exam:   10/14/2020 Medical Rec #: [id number]       Accession #:    [id number] Date of Birth: 17-Sep-1995        Patient Gender: F Patient Age:   25 years Exam Location:  [facility] Procedure:      VAS [place] TRANSCRANIAL DOPPLER W BUBBLES Referring Phys: PRAMOD SETHI --------------------------------------------------------------------------------  Indications: Stroke. Performing  Technologist: [place] Ponder RVS  Examination Guidelines: A complete evaluation includes B-mode imaging, spectral Doppler, color Doppler, and power Doppler as needed of all accessible portions of each vessel. Bilateral testing is considered an integral part of a complete examination. Limited examinations for reoccurring indications may be performed as noted.  Summary:  A vascular evaluation was performed. The right middle cerebral artery was studied. An IV was inserted into the patient's right forearm . Verbal informed consent was obtained.  HITS heard at rest: spencer 3 HITS heard during valsalva: Partial curtain *See table(s) above for TCD measurements and observations.    Preliminary    VAS [place] LOWER EXTREMITY VENOUS (DVT)  Result Date: 10/14/2020  Lower Venous DVT Study Patient Name:  ALLEY NEILS  Date of Exam:   10/14/2020 Medical Rec #: [id number]       Accession #:    [id number] Date of Birth: 12/29/1995        Patient Gender: F Patient Age:   25 years Exam Location:  [facility] Procedure:      VAS [place] LOWER EXTREMITY VENOUS (DVT) Referring Phys: Hilda Lias FRANCOIS --------------------------------------------------------------------------------  Indications: Stroke.  Limitations: Bandages. Comparison Study: no prior Performing Technologist: Argentina Ponder RVS  Examination Guidelines: A complete evaluation includes B-mode imaging, spectral Doppler, color Doppler, and power Doppler as needed of all accessible portions of each vessel. Bilateral testing is considered an integral part of a complete examination. Limited examinations for reoccurring indications may be performed as noted. The reflux portion of the exam is performed with the patient in reverse Trendelenburg.  +---------+---------------+---------+-----------+----------+-------------------+ RIGHT    CompressibilityPhasicitySpontaneityPropertiesThrombus Aging       +---------+---------------+---------+-----------+----------+-------------------+ CFV                                                   not visualized due                                                        to bandages         +---------+---------------+---------+-----------+----------+-------------------+ FV Prox  Full                                                             +---------+---------------+---------+-----------+----------+-------------------+ FV Mid   Full                                                             +---------+---------------+---------+-----------+----------+-------------------+  FV DistalFull                                                             +---------+---------------+---------+-----------+----------+-------------------+ PFV      Full                                                             +---------+---------------+---------+-----------+----------+-------------------+ POP      Full           Yes      Yes                                      +---------+---------------+---------+-----------+----------+-------------------+ PTV      Full                                                             +---------+---------------+---------+-----------+----------+-------------------+ PERO     Full                                                             +---------+---------------+---------+-----------+----------+-------------------+   +---------+---------------+---------+-----------+----------+--------------+ LEFT     CompressibilityPhasicitySpontaneityPropertiesThrombus Aging +---------+---------------+---------+-----------+----------+--------------+ CFV      Full           Yes      Yes                                 +---------+---------------+---------+-----------+----------+--------------+ SFJ      Full                                                         +---------+---------------+---------+-----------+----------+--------------+ FV Prox  Full                                                        +---------+---------------+---------+-----------+----------+--------------+ FV Mid   Full                                                        +---------+---------------+---------+-----------+----------+--------------+ FV DistalFull                                                        +---------+---------------+---------+-----------+----------+--------------+  PFV      Full                                                        +---------+---------------+---------+-----------+----------+--------------+ POP      Full           Yes      Yes                                 +---------+---------------+---------+-----------+----------+--------------+ PTV      Full                                                        +---------+---------------+---------+-----------+----------+--------------+ PERO     Full                                                        +---------+---------------+---------+-----------+----------+--------------+     Summary: BILATERAL: - No evidence of deep vein thrombosis seen in the lower extremities, bilaterally. -No evidence of popliteal cyst, bilaterally.   *See table(s) above for measurements and observations. Electronically signed by Lemar Livings MD on 10/14/2020 at 11:00:27 PM.    Final        Medical Problem List and Plan: 1.  Left-sided weakness with dysarthria secondary to right MCA right MCA territory infarct predominantly right caudate putamen anterior limb right internal capsule and overlying right frontal lobe secondary to right M1 occlusion status post revascularization mechanical thrombectomy 10/11/2020 as well as requiring repeat thrombectomy 10/12/2020 for occluded dominant superior division of right MCA  -patient may  shower  -ELOS/Goals: 10-14 days, goals mod I to supervision 2.   Antithrombotics: -DVT/anticoagulation:  Mechanical: Sequential compression devices, below knee Bilateral lower extremities  -antiplatelet therapy: Aspirin 81 mg daily 3. Pain Management: Tylenol as needed 4. Mood/anxiety : Team to Provide emotional support  -antipsychotic agents: N/A 5. Neuropsych: This patient is capable of making decisions on her own behalf. 6. Skin/Wound Care: Routine skin checks 7. Fluids/Electrolytes/Nutrition: Routine in and outs with follow-up chemistries 8.  Hyperlipidemia.  Lipitor 9.  Urine drug screen positive marijuana.  Counseling 10.  Obesity.  BMI 29.92.  Dietary follow-up    Charlton Amor, PA-C 10/15/2020

## 2020-10-15 NOTE — Progress Notes (Addendum)
STROKE TEAM PROGRESS NOTE   INTERVAL HISTORY:  Patient is sitting up in bed.  Her brother and another lady are at the bedside.  She complains of persistent headache and Tylenol has not helped.  Left hemiparesis is improving particularly the left leg.  TCD bubble study done yesterday was positive for medium size right-to-left shunt.  Lower extremity venous Dopplers were negative for DVT.  Anticardiolipin antibodies are pending rest of the hypercoagulable panel which is back is negative so far Patient is interested in participating the sleep smart study.  Vitals:   10/15/20 0700 10/15/20 0715 10/15/20 0730 10/15/20 1140  BP: 111/65     Pulse: 79  78   Resp: 11 14 15    Temp:   98.4 F (36.9 C) 98.9 F (37.2 C)  TempSrc:   Oral Axillary  SpO2: 99% 99% 100%   Weight:      Height:       CBC:  Recent Labs  Lab 10/12/20 0018 10/13/20 0308  WBC 8.5 13.4*  NEUTROABS 7.4 11.2*  HGB 13.2 11.6*  HCT 39.3 34.4*  MCV 86.9 89.4  PLT 304 290    Basic Metabolic Panel:  Recent Labs  Lab 10/12/20 0018 10/13/20 0308  NA 137 140  K 3.5 3.6  CL 107 112*  CO2 19* 21*  GLUCOSE 156* 139*  BUN 6 6  CREATININE 0.50 0.45  CALCIUM 8.9 8.4*  MG  --  1.7     Lipid Panel:  Recent Labs  Lab 10/12/20 0018  CHOL 171  TRIG 55  HDL 43  CHOLHDL 4.0  VLDL 11  LDLCALC 12/12/20*     HgbA1c:  Recent Labs  Lab 10/12/20 0018  HGBA1C 5.0    Urine Drug Screen:  Recent Labs  Lab 10/11/20 2219  LABOPIA NONE DETECTED  COCAINSCRNUR NONE DETECTED  LABBENZ POSITIVE*  AMPHETMU NONE DETECTED  THCU POSITIVE*  LABBARB NONE DETECTED     Alcohol Level  Recent Labs  Lab 10/11/20 0936  ETH <10     IMAGING past 24 hours VAS 12/11/20 TRANSCRANIAL DOPPLER W BUBBLES  Result Date: 10/14/2020  Transcranial Doppler with Bubble Patient Name:  Kristen Grant  Date of Exam:   10/14/2020 Medical Rec #: 12/14/2020       Accession #:    [id number] Date of Birth: [birth year]        Patient Gender: F Patient Age:   25  years Exam Location:  [facility] Procedure:      VAS [facility] TRANSCRANIAL DOPPLER W BUBBLES Referring Phys: Kellis Topete --------------------------------------------------------------------------------  Indications: Stroke. Performing Technologist: [place] RVS  Examination Guidelines: A complete evaluation includes B-mode imaging, spectral Doppler, color Doppler, and power Doppler as needed of all accessible portions of each vessel. Bilateral testing is considered an integral part of a complete examination. Limited examinations for reoccurring indications may be performed as noted.  Summary:  A vascular evaluation was performed. The right middle cerebral artery was studied. An IV was inserted into the patient's right forearm . Verbal informed consent was obtained.  HITS heard at rest: spencer 3 HITS heard during valsalva: Partial curtain *See table(s) above for TCD measurements and observations.    Preliminary    VAS [place] P[place] LOWER EXTREMITY VENOUS (DVT)  Result Date: 10/14/2020  Lower Venous DVT Study Patient Name:  Kristen Grant  Date of Exam:   10/14/2020 Medical Rec #: 12/14/2020       Accession #:    [id number] Date of Birth:  01-22-1996        Patient Gender: F Patient Age:   25 years Exam Location:  [facility] Procedure:      VAS [place] LOWER EXTREMITY VENOUS (DVT) Referring Phys: Hilda Lias FRANCOIS --------------------------------------------------------------------------------  Indications: Stroke.  Limitations: Bandages. Comparison Study: no prior Performing Technologist: [place] Ponder RVS  Examination Guidelines: A complete evaluation includes B-mode imaging, spectral Doppler, color Doppler, and power Doppler as needed of all accessible portions of each vessel. Bilateral testing is considered an integral part of a complete examination. Limited examinations for reoccurring indications may be performed as noted. The reflux portion of the exam is performed with the patient in reverse Trendelenburg.   +---------+---------------+---------+-----------+----------+-------------------+ RIGHT    CompressibilityPhasicitySpontaneityPropertiesThrombus Aging      +---------+---------------+---------+-----------+----------+-------------------+ CFV                                                   not visualized due                                                        to bandages         +---------+---------------+---------+-----------+----------+-------------------+ FV Prox  Full                                                             +---------+---------------+---------+-----------+----------+-------------------+ FV Mid   Full                                                             +---------+---------------+---------+-----------+----------+-------------------+ FV DistalFull                                                             +---------+---------------+---------+-----------+----------+-------------------+ PFV      Full                                                             +---------+---------------+---------+-----------+----------+-------------------+ POP      Full           Yes      Yes                                      +---------+---------------+---------+-----------+----------+-------------------+ PTV      Full                                                             +---------+---------------+---------+-----------+----------+-------------------+  PERO     Full                                                             +---------+---------------+---------+-----------+----------+-------------------+   +---------+---------------+---------+-----------+----------+--------------+ LEFT     CompressibilityPhasicitySpontaneityPropertiesThrombus Aging +---------+---------------+---------+-----------+----------+--------------+ CFV      Full           Yes      Yes                                  +---------+---------------+---------+-----------+----------+--------------+ SFJ      Full                                                        +---------+---------------+---------+-----------+----------+--------------+ FV Prox  Full                                                        +---------+---------------+---------+-----------+----------+--------------+ FV Mid   Full                                                        +---------+---------------+---------+-----------+----------+--------------+ FV DistalFull                                                        +---------+---------------+---------+-----------+----------+--------------+ PFV      Full                                                        +---------+---------------+---------+-----------+----------+--------------+ POP      Full           Yes      Yes                                 +---------+---------------+---------+-----------+----------+--------------+ PTV      Full                                                        +---------+---------------+---------+-----------+----------+--------------+ PERO     Full                                                        +---------+---------------+---------+-----------+----------+--------------+  Summary: BILATERAL: - No evidence of deep vein thrombosis seen in the lower extremities, bilaterally. -No evidence of popliteal cyst, bilaterally.   *See table(s) above for measurements and observations. Electronically signed by Lemar Livings MD on 10/14/2020 at 11:00:27 PM.    Final     PHYSICAL EXAM   Physical Exam  Constitutional: Obese young Caucasian lady not in distress. Psych: Affect appropriate to situation Eyes: No scleral injection HENT: No OP obstrucion MSK: no joint deformities.  Cardiovascular: Normal rate and regular rhythm.  Respiratory: Effort normal, non-labored breathing GI: Soft.  No distension. There is no  tenderness.  Skin: WDI  Neuro: Mental Status: Patient is awake, alert, oriented to person, place, month, year, and situation.  Patient is minimally dysarthric at time of exam, but is able to be understood in conversation.  No signs of aphasia or neglect   Cranial Nerves: II: Visual Fields are full. Pupils are equal, round, and reactive to light.    III,IV, VI: EOMI without ptosis or diploplia.  V: Facial sensation is asymmetric to temperature/ decreased sensation at left hemiface VII: Facial movement is asymmetric resting and smiling.  Moderate left lower facial weakness VIII: Hearing is intact to voice X: Palate elevates symmetrically XI: Shoulder shrug is asymmetric. XII: Tongue protrudes midline without atrophy or fasciculations.  Motor: Tone is normal. Bulk is normal. 5/5 strength was present only at right arm.  Left upper extremity strength is grade 2-3/5.  Right leg is able to be lifted off bed without difficulty. Left leg is able to lift off bed for 5 count  Sensory: Sensation is asymmetric to light touch and temperature in the arms and legs.   Plantars: Toes are downgoing bilaterally.   Cerebellar: FNF and HKS are intact bilaterally     Premorbid baseline modified Rankin scale 0  ASSESSMENT/PLAN Ms. Kristen Grant is a 25 y.o. female with history of anxiety, asthma, on birth control who presents to the ED via EMS as a Code Stroke after her boyfriend noted her to suddenly become weak on her left side shortly after waking up this morning. LKN was 0815 after waking. She was still lying in bed when her partner noticed that the left side of her face was drooping, she was drooling and the left side of her body was weak. On EMS arrival, they noted same. CBG was 106. On arrival to the ED, she had slight right gaze deviation, left facial droop, dysarthria and inability to move/lift her LUE and LLE. She was able to slowly answer questions. She was complaining of a right sided headache.    STAT CT head revealed dense right M1 and M2 segments with equivocal early hypodensity in the right temporal lobe. she was taken on 9/9 for revascularization of right MCA then had reocclusion of same on 9/10 and underwent repeat angioplasty on 9/10. Developed persistent left hemiplegia. CTH on 9/10 post angioplasty showed possible small amount of subarachnoid hemorrhage versus contrast staining.   Plan for doppler BLE as well as TCD today PT/OT eval today. Consider transfer out of ICU when medically stable.   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  Status posttreatment with IV TNKase and mechanical thrombectomy initially on 10/11/2020  with TICI 3 revascularizationand requiring repeat thrombectomy 24 hours later for occluded dominant superior division of the right MCA achieving TICI2B -2C revascularization  Code Stroke  CT head hypodensity at right anterior temporal lobe.   ASPECTS  10.     CTA head & neck:   1. Thrombus in the distal right M1 segment with near complete occlusion. There is reconstitution of flow in superior M2 division but no inferior M2 division is identified. 2. 11 cc infarct core in the right corona radiata, 75 cc penumbra. CT perfusion : CBF (<30%) Volume: 58mL Perfusion (Tmax>6.0s) volume: 53mL Mismatch Volume: 22mL Infarction Location:Right corona radiata Cerebral angio : report from Dr. Corliss Skains  bilateral common carotid and Lt VA angiograms followed by complete revascularization of occluded LT MCA M1 seg with x pass with contact aspiration and x1 pass with solitaire 27mm x 40 mm retriever and contact aspiration achieving a TICI 3 revascularization. Post CT brain No ICH . 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 Lower extremity venous Dopplers negative for DVT For planned TEE on 10/17/20 Doppler BLE show no DVT.   MRI BRAIN:  1. Acute anterior right MCA  territory infarct, predominantly involving the right caudate, putamen, anterior limb of the right internal capsule, insula, operculum and overlying right frontal lobe. A few punctate acute infarcts in the right parietal lobe. 2. Associated cytotoxic edema with slight (2-3 mm) leftward midline shift at the foramen of Monro. 3. Probable small amount of hemorrhage in the posterior right frontal lobe/insular region. 2D Echo LVEF 60-65%, normal LA, poorly visualized interatrial septum  LDL 117 HgbA1c 5.0 VTE prophylaxis - scd    Diet   Diet regular Room service appropriate? Yes with Assist; Fluid consistency: Thin   No antithrombotic prior to admission, now on aspirin 81mg  daily Therapy recommendations:   CIR Disposition:  CIR   Hyperlipidemia Home meds:   none LDL 117, goal < 70 Add ed lipitor 80mg  daily   High intensity statin   Continue statin at discharge    HgbA1c 5.0, goal < 7.0 CBGs Recent Labs    10/12/20 2104 10/13/20 0313 10/13/20 1955  GLUCAP 106* 129* 95     SSI  Other Stroke Risk Factors  Birth control use   Substance abuse - UDS:  THC POSITIVE, Cocaine NONE DETECTED. Patient advised to stop using due to stroke risk.  Obesity, Body mass index is 29.92 kg/m., BMI >/= 30 associated with increased stroke risk, recommend weight loss, diet and exercise as appropriate   Family hx stroke (grandmother, multiple aunts)  Continue mobilization out of bed.  Transfer to neurology floor bed.  Check TEE on Thursday.  Continue dual antiplatelet therapy.  Elective consideration for endovascular PFO closure after finishing rehab.  Long discussion with the patient and brother at the bedside as well as subsequently with her mom as well about her condition and evaluation and treatment plan and answered questions.  Greater than 50% time during this 35-minute visit was spent on counseling and coordination of care about her stroke and PFO and answering questions.  12/13/20 MD   To  contact Stroke Continuity provider, please refer to Wednesday. After hours, contact General Neurology

## 2020-10-15 NOTE — Progress Notes (Signed)
Physical Therapy Treatment Patient Details Name: Kristen Grant MRN: [id number] DOB: 1995/10/05 Today's Date: 10/15/2020   History of Present Illness Pt is a 25 y.o. female admitted 10/11/20 with c/o L sided weakness and facial droop. Head CT 9/9 with hypodensity in R anterior temporal lobe infarct. CTA 9/9 with thrombus in distal R M1 segment with near complete occlusion, 11cc infarct R corona radiata, 75 cc penumbra. Pt s/p R MCA mechanical thrombactomy with revascularization on 9/9. Pt with recurrent symptoms requiring return to IR for repeat R MCA thrombectomy 9/10. Brain MRI 9/11 shows acute anterior R MCA infarcts, associated cytotoxic edema with slight (2-57mm) midline shift. 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 TEE planned 9/15. PMH includes anxiety, migraines.   PT Comments    Pt progressing well with mobility; noted improvements in LLE muscle activation. Today's session focused on transfer and gait training, pt requiring modA+2 for mobility. Pt limited by L-side weakness, decreased activity tolerance, and impaired balance strategies/postural reactions; pt motivated to participate and regain PLOF. Pt's mother present and supportive. Pt remains an excellent candidate for intensive CIR-leve therapies to maximize functional mobility and independence prior to return home.    Recommendations for follow up therapy are one component of a multi-disciplinary discharge planning process, led by the attending physician.  Recommendations may be updated based on patient status, additional functional criteria and insurance authorization.  Follow Up Recommendations  CIR     Equipment Recommendations  Other (comment) (TBD)    Recommendations for Other Services       Precautions / Restrictions Precautions Precautions: Fall Restrictions Weight Bearing Restrictions: No     Mobility  Bed Mobility Overal bed mobility: Needs Assistance Bed  Mobility: Supine to Sit     Supine to sit: Mod assist;HOB elevated     General bed mobility comments: Pt reliant on use of RUE to assist LLE to EOB, modA for RUE HHA to scoot hips towards EOB (all towards R-side of bed)    Transfers Overall transfer level: Needs assistance Equipment used: 2 person hand held assist Transfers: Sit to/from Stand Sit to Stand: Mod assist;+2 safety/equipment         General transfer comment: Pt reliant on RUE support and modA to stand from EOB; additional sit<>stands from recliner, pt able to push with RUE on armrest, requiring modA to assist trunk elevation and maintain balance; poor eccentric control into sitting, dependent for LUE management; no L knee blocking needed this session  Ambulation/Gait Ambulation/Gait assistance: Mod assist;+2 physical assistance;+2 safety/equipment Gait Distance (Feet): 18 Feet Assistive device: 2 person hand held assist Gait Pattern/deviations: Step-to pattern;Decreased dorsiflexion - left;Decreased weight shift to left;Shuffle Gait velocity: Decreased   General Gait Details: Pt able to advance LLE this session, pt still with difficulty clearing L foot from floor in order to take complete step; pt requiring modA+2 for balance, heavy reliance on RUE support when offloading RLE to take step; no overt knee instability noted this session, but pt attempting to keep knee hyperextended at times; decreased ability to advance LLE with fatigue   Stairs             Wheelchair Mobility    Modified Rankin (Stroke Patients Only)       Balance Overall balance assessment: Needs assistance Sitting-balance support: Single extremity supported;Feet supported Sitting balance-Leahy Scale: Fair Sitting balance - Comments: Able to maintain static sitting at EOB without UE support; reliant on RUE support at  edge of recliner with increasing fatigue   Standing balance support: During functional activity;Bilateral upper extremity  supported;Single extremity supported Standing balance-Leahy Scale: Poor Standing balance comment: Reliance on RUE support to maintain balance with static and dynamic activity                            Cognition Arousal/Alertness: Awake/alert Behavior During Therapy: Flat affect Overall Cognitive Status: Within Functional Limits for tasks assessed                                 General Comments: No anxiety noted this session; continue with flat affect, but very appreciative of getting to sit outside on 2H terrace this session; following simple commands appropriately      Exercises Other Exercises Other Exercises: Partial squats x10 (no knee buckling noted), LLE LAQ and seated hip flex (almost full range), bilateral ankle pumps (still lacking some L foot DF)    General Comments General comments (skin integrity, edema, etc.): Pt's mother Dois Davenport) present and supportive. RN agreeable to let pt and mother sit outside at end of session; pt very appreciative of this      Pertinent Vitals/Pain Pain Assessment: Faces Faces Pain Scale: Hurts a little bit Pain Location: Head Pain Descriptors / Indicators: Headache Pain Intervention(s): Monitored during session    Home Living                      Prior Function            PT Goals (current goals can now be found in the care plan section) Progress towards PT goals: Progressing toward goals    Frequency    Min 4X/week      PT Plan Current plan remains appropriate    Co-evaluation              AM-PAC PT "6 Clicks" Mobility   Outcome Measure  Help needed turning from your back to your side while in a flat bed without using bedrails?: A Lot Help needed moving from lying on your back to sitting on the side of a flat bed without using bedrails?: A Lot Help needed moving to and from a bed to a chair (including a wheelchair)?: A Lot Help needed standing up from a chair using your arms  (e.g., wheelchair or bedside chair)?: A Lot Help needed to walk in hospital room?: Total Help needed climbing 3-5 steps with a railing? : Total 6 Click Score: 10    End of Session Equipment Utilized During Treatment: Gait belt Activity Tolerance: Patient tolerated treatment well Patient left: in chair;with family/visitor present (seated outside; RN present and aware) Nurse Communication: Mobility status PT Visit Diagnosis: Other abnormalities of gait and mobility (R26.89);Difficulty in walking, not elsewhere classified (R26.2);Other symptoms and signs involving the nervous system (R29.898);Hemiplegia and hemiparesis Hemiplegia - Right/Left: Left Hemiplegia - dominant/non-dominant: Non-dominant Hemiplegia - caused by: Cerebral infarction     Time: 2130-8657 PT Time Calculation (min) (ACUTE ONLY): 34 min  Charges:  $Gait Training: 8-22 mins $Therapeutic Activity: 8-22 mins                     Ina Homes, PT, DPT Acute Rehabilitation Services  Pager [phone] Office [phone]  Malachy Chamber 10/15/2020, 5:38 PM

## 2020-10-16 DIAGNOSIS — I639 Cerebral infarction, unspecified: Secondary | ICD-10-CM | POA: Diagnosis not present

## 2020-10-16 MED ORDER — GERHARDT'S BUTT CREAM
TOPICAL_CREAM | Freq: Three times a day (TID) | CUTANEOUS | Status: DC
  Filled 2020-10-16 (×2): qty 1

## 2020-10-16 NOTE — Progress Notes (Addendum)
STROKE TEAM PROGRESS NOTE   INTERVAL HISTORY:  Patient is sitting up in bed.  Her family is at the bedside providing support..She states left hemiparesis is improving, particularly the left leg.  She walked with physical therapy and was able to walk a bit.  She did do the overnight NOx 3 monitor but did not have significant sleep apnea hence she did not qualify for the sleep smart study. Hypercoagulable work-up is negative but anticardiolipin antibodies are pending.  ANA and RPR are negative Vitals:   10/16/20 0500 10/16/20 0600 10/16/20 0806 10/16/20 0810  BP:    112/72  Pulse:      Resp: 16 11  11   Temp:   99.1 F (37.3 C)   TempSrc:   Oral   SpO2: 95% 98%  97%  Weight:      Height:       CBC:  Recent Labs  Lab 10/12/20 0018 10/13/20 0308  WBC 8.5 13.4*  NEUTROABS 7.4 11.2*  HGB 13.2 11.6*  HCT 39.3 34.4*  MCV 86.9 89.4  PLT 304 290    Basic Metabolic Panel:  Recent Labs  Lab 10/12/20 0018 10/13/20 0308  NA 137 140  K 3.5 3.6  CL 107 112*  CO2 19* 21*  GLUCOSE 156* 139*  BUN 6 6  CREATININE 0.50 0.45  CALCIUM 8.9 8.4*  MG  --  1.7     Lipid Panel:  Recent Labs  Lab 10/12/20 0018  CHOL 171  TRIG 55  HDL 43  CHOLHDL 4.0  VLDL 11  LDLCALC 12/12/20*     HgbA1c:  Recent Labs  Lab 10/12/20 0018  HGBA1C 5.0    Urine Drug Screen:  Recent Labs  Lab 10/11/20 2219  LABOPIA NONE DETECTED  COCAINSCRNUR NONE DETECTED  LABBENZ POSITIVE*  AMPHETMU NONE DETECTED  THCU POSITIVE*  LABBARB NONE DETECTED     Alcohol Level  Recent Labs  Lab 10/11/20 0936  ETH <10     IMAGING past 24 hours No results found.  PHYSICAL EXAM   Physical Exam  Constitutional: Obese  Caucasian lady not in distress. Psych: Affect appropriate to situation. She has a persistently flat affect, and often is not engaged in conversation.  Eyes: No scleral injection HENT: No OP obstrucion MSK: no joint deformities.  Cardiovascular: Normal rate and regular rhythm.  Respiratory:  Effort normal, non-labored breathing GI: Soft.  No distension. There is no tenderness.  Skin: WDI  Neuro: Mental Status: Patient is awake, alert, oriented to person, place, month, year, and situation.  Patient is minimally dysarthric at time of exam, but is able to be understood in conversation.  No signs of aphasia or neglect   Cranial Nerves: II: Visual Fields are full. Pupils are equal, round, and reactive to light.    III,IV, VI: EOMI without ptosis or diploplia.  V: Facial sensation is asymmetric to temperature/ decreased sensation at left hemiface VII: Facial movement is asymmetric resting and smiling.  Moderate left lower facial weakness VIII: Hearing is intact to voice X: Palate elevates symmetrically XI: Shoulder shrug is asymmetric. XII: Tongue protrudes midline without atrophy or fasciculations.  Motor: Tone is normal. Bulk is normal. 5/5 strength was present only at right arm.  Left upper extremity strength is grade 2/5.  Right leg is able to be lifted off bed without difficulty. Left leg is able to lift off bed for 5 count  Sensory: Sensation is asymmetric to light touch and temperature in the arms and legs.  Plantars: Toes are downgoing bilaterally.   Cerebellar: FNF and HKS are intact bilaterally     Premorbid baseline modified Rankin scale 0  ASSESSMENT/PLAN Ms. Kristen Grant is a 25 y.o. female with history of anxiety, asthma, on birth control who presents to the ED via EMS as a Code Stroke after her boyfriend noted her to suddenly become weak on her left side shortly after waking up this morning. LKN was 0815 after waking. She was still lying in bed when her partner noticed that the left side of her face was drooping, she was drooling and the left side of her body was weak. On EMS arrival, they noted same. CBG was 106. On arrival to the ED, she had slight right gaze deviation, left facial droop, dysarthria and inability to move/lift her LUE and LLE. She was able to  slowly answer questions. She was complaining of a right sided headache.   STAT CT head revealed dense right M1 and M2 segments with equivocal early hypodensity in the right temporal lobe. she was taken on 9/9 for revascularization of right MCA then had reocclusion of same on 9/10 and underwent repeat angioplasty on 9/10. Developed persistent left hemiplegia. CTH on 9/10 post angioplasty showed possible small amount of subarachnoid hemorrhage versus contrast staining.     PT/OT eval today. May be transfer out of ICU when bed available.   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  Status posttreatment with IV TNKase and mechanical thrombectomy initially on 10/11/2020  with TICI 3 revascularizationand requiring repeat thrombectomy 24 hours later for occluded dominant superior division of the right MCA achieving TICI2B -2C revascularization  Code Stroke  CT head hypodensity at right anterior temporal lobe.   ASPECTS 10.     CTA head & neck:   1. Thrombus in the distal right M1 segment with near complete occlusion. There is reconstitution of flow in superior M2 division but no inferior M2 division is identified. 2. 11 cc infarct core in the right corona radiata, 75 cc penumbra. CT perfusion : CBF (<30%) Volume: 91mL Perfusion (Tmax>6.0s) volume: 58mL Mismatch Volume: 43mL Infarction Location:Right corona radiata Cerebral angio : report from Dr. Corliss Skains  bilateral common carotid and Lt VA angiograms followed by complete revascularization of occluded LT MCA M1 seg with x pass with contact aspiration and x1 pass with solitaire 32mm x 40 mm retriever and contact aspiration achieving a TICI 3 revascularization. Post CT brain No ICH . 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 Continue dual antiplatelet therapy .  Lower extremity venous Dopplers negative for DVT For planned TEE on 10/17/20  Elective  consideration for endovascular PFO closure after finishing rehab.  Long discussion with the patient and boyfriend at the bedside  about her condition and evaluation and treatment plan and answered questions.   MRI BRAIN:  1. Acute anterior right MCA territory infarct, predominantly involving the right caudate, putamen, anterior limb of the right internal capsule, insula, operculum and overlying right frontal lobe. A few punctate acute infarcts in the right parietal lobe. 2. Associated cytotoxic edema with slight (2-3 mm) leftward midline shift at the foramen of Monro. 3. Probable small amount of hemorrhage in the posterior right frontal lobe/insular region. 2D Echo LVEF 60-65%, normal LA, poorly visualized interatrial septum  LDL 117 HgbA1c 5.0 VTE prophylaxis - scd    Diet   Diet regular Room service appropriate? Yes with Assist;  Fluid consistency: Thin   No antithrombotic prior to admission, now on aspirin 81mg  daily Therapy recommendations:   CIR Disposition:  CIR   Hyperlipidemia Home meds:   none LDL 117, goal < 70 Add ed lipitor 80mg  daily   High intensity statin   Continue statin at discharge    HgbA1c 5.0, goal < 7.0 CBGs Recent Labs    10/13/20 1955  GLUCAP 95     SSI  Other Stroke Risk Factors  Birth control use   Substance abuse - UDS:  THC POSITIVE, Cocaine NONE DETECTED. Patient advised to stop using due to stroke risk.  Obesity, Body mass index is 29.63 kg/m., BMI >/= 30 associated with increased stroke risk, recommend weight loss, diet and exercise as appropriate   Family hx stroke (grandmother, multiple aunts)  Continue mobilization out of bed.  Transfer to neurology floor bed.  Check TEE on Thursday.  Long discussion patient and wife are at the bedside and answered questions.  Greater than 50% time during this 25-minute visit was spent on counseling and coordination of care about her stroke and PFO and answering questions.  12/13/20 MD   To  contact Stroke Continuity provider, please refer to Monday. After hours, contact General Neurology

## 2020-10-16 NOTE — Progress Notes (Signed)
    [facility] has been requested to perform a transesophageal echocardiogram on 10/17/20 for Stroke.  After careful review of history and examination, the risks and benefits of transesophageal echocardiogram have been explained including risks of esophageal damage, perforation (1:10,000 risk), bleeding, pharyngeal hematoma as well as other potential complications associated with conscious sedation including aspiration, arrhythmia, respiratory failure and death. Alternatives to treatment were discussed, questions were answered. Patient is willing to proceed. Labs and vital signs stable.   Laverda Page, NP-C 10/16/2020 3:07 PM

## 2020-10-16 NOTE — Progress Notes (Addendum)
Physical Therapy Treatment Patient Details Name: Kristen Grant MRN: [id number] DOB: 28-Sep-1995 Today's Date: 10/16/2020   History of Present Illness Pt is a 25 y.o. female admitted 10/11/20 with c/o L sided weakness and facial droop. Head CT 9/9 with hypodensity in R anterior temporal lobe infarct. CTA 9/9 with thrombus in distal R M1 segment with near complete occlusion, 11cc infarct R corona radiata, 75 cc penumbra. Pt s/p R MCA mechanical thrombectomy with revascularization on 9/9. Pt with recurrent symptoms requiring return to IR for repeat R MCA thrombectomy 9/10. Brain MRI 9/11 shows acute anterior R MCA infarcts, associated cytotoxic edema with slight (2-21mm) midline shift. 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 TEE planned 9/15. PMH includes anxiety, migraines.   PT Comments    Pt progressing with mobility. Today's session focused on transfer and gait training while incorporating weight bearing through LUE. Pt still with little to no active movement noted in LUE, reports arm is numb; dependent for majority of LUE positioning. Pt remains limited by L-side weakness, decreased activity tolerance, and impaired balance strategies/postural reactions. Continue to recommend intensive CIR-level therapies to maximize functional mobility and independence prior to return home.    Recommendations for follow up therapy are one component of a multi-disciplinary discharge planning process, led by the attending physician.  Recommendations may be updated based on patient status, additional functional criteria and insurance authorization.  Follow Up Recommendations  CIR     Equipment Recommendations  Other (comment) (TBD)    Recommendations for Other Services       Precautions / Restrictions Precautions Precautions: Fall Restrictions Weight Bearing Restrictions: No     Mobility  Bed Mobility Overal bed mobility: Needs Assistance Bed  Mobility: Supine to Sit     Supine to sit: Min assist;HOB elevated     General bed mobility comments: Reliant on HOB elevated and use of bed rail, improved ability to manage LLE to EOB without UE assist, ultimately requiring minA to scoot L hip to EOB    Transfers Overall transfer level: Needs assistance Equipment used: 2 person hand held assist Transfers: Sit to/from Stand Sit to Stand: Min assist;+2 safety/equipment         General transfer comment: Pt frequently reaching for RUE support to pull to stand, requiring cues to push with RUE, requiring minA for trunk elevation and stability; totalA for LUE management  Ambulation/Gait Ambulation/Gait assistance: Mod assist;+2 safety/equipment Gait Distance (Feet): 10 Feet (+ 60' + 40' + 40') Assistive device: 2 person hand held assist;1 person hand held assist;4-wheeled walker Gait Pattern/deviations: Step-to pattern;Decreased dorsiflexion - left;Decreased weight shift to left Gait velocity: Decreased   General Gait Details: Slow, unsteady gait with initial bilateral HHA, dependent for LUE management, reliant on RUE support and modA for stability; additional gait trials with eva walker to encourage weight bearing through LUE, as well as single LUE support, pt fluctuating min-modA for stability; inital improved ability to take complete steps with L foot, reliant on increased hip flex to clear foot, worsening LLE dragging with fatigue   Stairs             Wheelchair Mobility    Modified Rankin (Stroke Patients Only)       Balance Overall balance assessment: Needs assistance Sitting-balance support: Single extremity supported;Feet supported Sitting balance-Leahy Scale: Fair Sitting balance - Comments: Difficulty reaching outside of BOS to reach feet with RUE   Standing balance support: During functional activity;Bilateral upper  extremity supported;Single extremity supported Standing balance-Leahy Scale: Poor Standing  balance comment: Reliant on UE support and/or external assist                            Cognition Arousal/Alertness: Awake/alert Behavior During Therapy: Flat affect Overall Cognitive Status: Within Functional Limits for tasks assessed                                 General Comments: Continue with flat affect, but very appreciative of session, at times smiling. Following commands appropriately      Exercises      General Comments General comments (skin integrity, edema, etc.): Pt motivated to participate; happy to incorporate bathing with OT, little to no active use of LUE noted with ADL tasks, pt reports "my arm feels numb" - continues to demonstrate improving LLE muscle activation, although still weak, especially with fatigue      Pertinent Vitals/Pain Pain Assessment: Faces Faces Pain Scale: Hurts a little bit Pain Location: R-side groin incision Pain Descriptors / Indicators: Discomfort Pain Intervention(s): Monitored during session    Home Living                      Prior Function            PT Goals (current goals can now be found in the care plan section) Progress towards PT goals: Progressing toward goals    Frequency    Min 4X/week      PT Plan Current plan remains appropriate    Co-evaluation              AM-PAC PT "6 Clicks" Mobility   Outcome Measure  Help needed turning from your back to your side while in a flat bed without using bedrails?: A Little Help needed moving from lying on your back to sitting on the side of a flat bed without using bedrails?: A Lot Help needed moving to and from a bed to a chair (including a wheelchair)?: A Lot Help needed standing up from a chair using your arms (e.g., wheelchair or bedside chair)?: A Little Help needed to walk in hospital room?: A Lot Help needed climbing 3-5 steps with a railing? : Total 6 Click Score: 13    End of Session Equipment Utilized During  Treatment: Gait belt Activity Tolerance: Patient tolerated treatment well Patient left: in chair;with call bell/phone within reach Nurse Communication: Mobility status PT Visit Diagnosis: Other abnormalities of gait and mobility (R26.89);Difficulty in walking, not elsewhere classified (R26.2);Other symptoms and signs involving the nervous system (R29.898);Hemiplegia and hemiparesis Hemiplegia - Right/Left: Left Hemiplegia - dominant/non-dominant: Non-dominant Hemiplegia - caused by: Cerebral infarction     Time: 3976-7341 PT Time Calculation (min) (ACUTE ONLY): 40 min  Charges:  $Gait Training: 8-22 mins $Therapeutic Activity: 8-22 mins                     Ina Homes, PT, DPT Acute Rehabilitation Services  Pager [phone] Office [phone]  Malachy Chamber 10/16/2020, 4:39 PM

## 2020-10-16 NOTE — H&P (View-Only) (Signed)
STROKE TEAM PROGRESS NOTE   INTERVAL HISTORY:  Patient is sitting up in bed.  Her family is at the bedside providing support..She states left hemiparesis is improving, particularly the left leg.  She walked with physical therapy and was able to walk a bit.  She did do the overnight NOx 3 monitor but did not have significant sleep apnea hence she did not qualify for the sleep smart study. Hypercoagulable work-up is negative but anticardiolipin antibodies are pending.  ANA and RPR are negative Vitals:   10/16/20 0500 10/16/20 0600 10/16/20 0806 10/16/20 0810  BP:    112/72  Pulse:      Resp: 16 11  11  Temp:   99.1 F (37.3 C)   TempSrc:   Oral   SpO2: 95% 98%  97%  Weight:      Height:       CBC:  Recent Labs  Lab 10/12/20 0018 10/13/20 0308  WBC 8.5 13.4*  NEUTROABS 7.4 11.2*  HGB 13.2 11.6*  HCT 39.3 34.4*  MCV 86.9 89.4  PLT 304 290    Basic Metabolic Panel:  Recent Labs  Lab 10/12/20 0018 10/13/20 0308  NA 137 140  K 3.5 3.6  CL 107 112*  CO2 19* 21*  GLUCOSE 156* 139*  BUN 6 6  CREATININE 0.50 0.45  CALCIUM 8.9 8.4*  MG  --  1.7     Lipid Panel:  Recent Labs  Lab 10/12/20 0018  CHOL 171  TRIG 55  HDL 43  CHOLHDL 4.0  VLDL 11  LDLCALC 117*     HgbA1c:  Recent Labs  Lab 10/12/20 0018  HGBA1C 5.0    Urine Drug Screen:  Recent Labs  Lab 10/11/20 2219  LABOPIA NONE DETECTED  COCAINSCRNUR NONE DETECTED  LABBENZ POSITIVE*  AMPHETMU NONE DETECTED  THCU POSITIVE*  LABBARB NONE DETECTED     Alcohol Level  Recent Labs  Lab 10/11/20 0936  ETH <10     IMAGING past 24 hours No results found.  PHYSICAL EXAM   Physical Exam  Constitutional: Obese  Caucasian lady not in distress. Psych: Affect appropriate to situation. She has a persistently flat affect, and often is not engaged in conversation.  Eyes: No scleral injection HENT: No OP obstrucion MSK: no joint deformities.  Cardiovascular: Normal rate and regular rhythm.  Respiratory:  Effort normal, non-labored breathing GI: Soft.  No distension. There is no tenderness.  Skin: WDI  Neuro: Mental Status: Patient is awake, alert, oriented to person, place, month, year, and situation.  Patient is minimally dysarthric at time of exam, but is able to be understood in conversation.  No signs of aphasia or neglect   Cranial Nerves: II: Visual Fields are full. Pupils are equal, round, and reactive to light.    III,IV, VI: EOMI without ptosis or diploplia.  V: Facial sensation is asymmetric to temperature/ decreased sensation at left hemiface VII: Facial movement is asymmetric resting and smiling.  Moderate left lower facial weakness VIII: Hearing is intact to voice X: Palate elevates symmetrically XI: Shoulder shrug is asymmetric. XII: Tongue protrudes midline without atrophy or fasciculations.  Motor: Tone is normal. Bulk is normal. 5/5 strength was present only at right arm.  Left upper extremity strength is grade 2/5.  Right leg is able to be lifted off bed without difficulty. Left leg is able to lift off bed for 5 count  Sensory: Sensation is asymmetric to light touch and temperature in the arms and legs.     Plantars: Toes are downgoing bilaterally.   Cerebellar: FNF and HKS are intact bilaterally     Premorbid baseline modified Rankin scale 0  ASSESSMENT/PLAN Ms. Kristen Grant is a 25 y.o. female with history of anxiety, asthma, on birth control who presents to the ED via EMS as a Code Stroke after her boyfriend noted her to suddenly become weak on her left side shortly after waking up this morning. LKN was 0815 after waking. She was still lying in bed when her partner noticed that the left side of her face was drooping, she was drooling and the left side of her body was weak. On EMS arrival, they noted same. CBG was 106. On arrival to the ED, she had slight right gaze deviation, left facial droop, dysarthria and inability to move/lift her LUE and LLE. She was able to  slowly answer questions. She was complaining of a right sided headache.   STAT CT head revealed dense right M1 and M2 segments with equivocal early hypodensity in the right temporal lobe. she was taken on 9/9 for revascularization of right MCA then had reocclusion of same on 9/10 and underwent repeat angioplasty on 9/10. Developed persistent left hemiplegia. CTH on 9/10 post angioplasty showed possible small amount of subarachnoid hemorrhage versus contrast staining.     PT/OT eval today. May be transfer out of ICU when bed available.   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  Status posttreatment with IV TNKase and mechanical thrombectomy initially on 10/11/2020  with TICI 3 revascularizationand requiring repeat thrombectomy 24 hours later for occluded dominant superior division of the right MCA achieving TICI2B -2C revascularization  Code Stroke  CT head hypodensity at right anterior temporal lobe.   ASPECTS 10.     CTA head & neck:   1. Thrombus in the distal right M1 segment with near complete occlusion. There is reconstitution of flow in superior M2 division but no inferior M2 division is identified. 2. 11 cc infarct core in the right corona radiata, 75 cc penumbra. CT perfusion : CBF (<30%) Volume: 11mL Perfusion (Tmax>6.0s) volume: 86mL Mismatch Volume: 75mL Infarction Location:Right corona radiata Cerebral angio : report from Dr. Deveshwar  bilateral common carotid and Lt VA angiograms followed by complete revascularization of occluded LT MCA M1 seg with x pass with contact aspiration and x1 pass with solitaire 3mm x 40 mm retriever and contact aspiration achieving a TICI 3 revascularization. Post CT brain No ICH . 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 Continue dual antiplatelet therapy .  Lower extremity venous Dopplers negative for DVT For planned TEE on 10/17/20  Elective  consideration for endovascular PFO closure after finishing rehab.  Long discussion with the patient and boyfriend at the bedside  about her condition and evaluation and treatment plan and answered questions.   MRI BRAIN:  1. Acute anterior right MCA territory infarct, predominantly involving the right caudate, putamen, anterior limb of the right internal capsule, insula, operculum and overlying right frontal lobe. A few punctate acute infarcts in the right parietal lobe. 2. Associated cytotoxic edema with slight (2-3 mm) leftward midline shift at the foramen of Monro. 3. Probable small amount of hemorrhage in the posterior right frontal lobe/insular region. 2D Echo LVEF 60-65%, normal LA, poorly visualized interatrial septum  LDL 117 HgbA1c 5.0 VTE prophylaxis - scd    Diet   Diet regular Room service appropriate? Yes with Assist;   Fluid consistency: Thin   No antithrombotic prior to admission, now on aspirin 81mg  daily Therapy recommendations:   CIR Disposition:  CIR   Hyperlipidemia Home meds:   none LDL 117, goal < 70 Add ed lipitor 80mg  daily   High intensity statin   Continue statin at discharge    HgbA1c 5.0, goal < 7.0 CBGs Recent Labs    10/13/20 1955  GLUCAP 95     SSI  Other Stroke Risk Factors  Birth control use   Substance abuse - UDS:  THC POSITIVE, Cocaine NONE DETECTED. Patient advised to stop using due to stroke risk.  Obesity, Body mass index is 29.63 kg/m., BMI >/= 30 associated with increased stroke risk, recommend weight loss, diet and exercise as appropriate   Family hx stroke (grandmother, multiple aunts)  Continue mobilization out of bed.  Transfer to neurology floor bed.  Check TEE on Thursday.  Long discussion patient and wife are at the bedside and answered questions.  Greater than 50% time during this 25-minute visit was spent on counseling and coordination of care about her stroke and PFO and answering questions.  12/13/20 MD   To  contact Stroke Continuity provider, please refer to Monday. After hours, contact General Neurology

## 2020-10-16 NOTE — Progress Notes (Signed)
Pt sat at sink on [facility] for UB bathing and dressing, grooming and pericare. Educated in hemitechniques throughout. Pt ambulating with +2 min hand held assist into bathroom, progressed to EVA walker and then to one hand assist. No active movement noted in L UE, reports increase in numb sensation this date. Continues to be an excellent intensive rehab candidate.   10/16/20 1655  OT Visit Information  Last OT Received On 10/16/20  Assistance Needed +1  History of Present Illness Pt is a 25 y.o. female admitted 10/11/20 with c/o L sided weakness and facial droop. Head CT 9/9 with hypodensity in R anterior temporal lobe infarct. CTA 9/9 with thrombus in distal R M1 segment with near complete occlusion, 11cc infarct R corona radiata, 75 cc penumbra. Pt s/p R MCA mechanical thrombectomy with revascularization on 9/9. Pt with recurrent symptoms requiring return to IR for repeat R MCA thrombectomy 9/10. Brain MRI 9/11 shows acute anterior R MCA infarcts, associated cytotoxic edema with slight (2-24mm) midline shift. 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 TEE planned 9/15. PMH includes anxiety, migraines.  Precautions  Precautions Fall  Precaution Comments left hemiparesis  Pain Assessment  Pain Assessment Faces  Faces Pain Scale 2  Pain Location R-side groin incision  Pain Descriptors / Indicators Discomfort;Sore  Pain Intervention(s) Monitored during session  Cognition  Arousal/Alertness Awake/alert  Behavior During Therapy Flat affect  Overall Cognitive Status Within Functional Limits for tasks assessed  General Comments Continue with flat affect, but very appreciative of session, at times smiling. Following commands appropriately  Upper Extremity Assessment  Upper Extremity Assessment LUE deficits/detail  LUE Deficits / Details no active movement, but noted to shape hand on EVA walker and hand does not fall off lap, lags behind pt with bed  mobility, pt reports it is completely numb today vs eval when it was only her fingertips  LUE Coordination decreased fine motor;decreased gross motor  ADL  Overall ADL's  Needs assistance/impaired  Grooming Wash/dry hands;Set up;Sitting;Brushing hair;Total assistance  Grooming Details (indicate cue type and reason) washing face somewhat perseveratively, total assist for knots out of her hair and to braid  Upper Body Bathing Moderate assistance;Sitting  Upper Body Dressing  Moderate assistance;Sitting  Toileting- Clothing Manipulation and Hygiene Minimal assistance;Sit to/from stand  Functional mobility during ADLs Minimal assistance (EVA walker)  General ADL Comments Pt educated in hemitechniques for UB bathing and dressing.  Bed Mobility  Overal bed mobility Needs Assistance  Bed Mobility Supine to Sit  Supine to sit Min assist;HOB elevated  Balance  Overall balance assessment Needs assistance  Sitting balance-Leahy Scale Fair  Standing balance-Leahy Scale Poor  Standing balance comment Reliant on UE support and/or external assist  Transfers  Overall transfer level Needs assistance  Equipment used 2 person hand held assist  Transfers Sit to/from Stand  Sit to Stand Min assist;+2 safety/equipment  General transfer comment Pt frequently reaching for RUE support to pull to stand, requiring cues to push with RUE, requiring minA for trunk elevation and stability; totalA for LUE management  OT - End of Session  Activity Tolerance Patient tolerated treatment well  Patient left in chair;with call bell/phone within reach  OT Assessment/Plan  OT Plan Discharge plan remains appropriate  OT Visit Diagnosis Unsteadiness on feet (R26.81);Muscle weakness (generalized) (M62.81);Hemiplegia and hemiparesis;Pain  Hemiplegia - Right/Left Left  Hemiplegia - dominant/non-dominant Non-Dominant  Hemiplegia - caused by Cerebral infarction  OT Frequency (ACUTE ONLY) Min 3X/week  Follow Up Recommendations  CIR  OT Equipment 3 in 1 bedside commode  AM-PAC OT "6 Clicks" Daily Activity Outcome Measure (Version 2)  Help from another person eating meals? 3  Help from another person taking care of personal grooming? 2  Help from another person toileting, which includes using toliet, bedpan, or urinal? 2  Help from another person bathing (including washing, rinsing, drying)? 2  Help from another person to put on and taking off regular upper body clothing? 2  Help from another person to put on and taking off regular lower body clothing? 2  6 Click Score 13  Progressive Mobility  What is the highest level of mobility based on the progressive mobility assessment? Level 5 (Walks with assist in room/hall) - Balance while stepping forward/back and can walk in room with assist - Complete  Mobility Ambulated with assistance in hallway  OT Goal Progression  Progress towards OT goals Progressing toward goals  Acute Rehab OT Goals  Patient Stated Goal be able to walk and go home  OT Goal Formulation With patient  Time For Goal Achievement 10/28/20  Potential to Achieve Goals Good  OT Time Calculation  OT Start Time (ACUTE ONLY) 1502  OT Stop Time (ACUTE ONLY) 1543  OT Time Calculation (min) 41 min  OT General Charges  $OT Visit 1 Visit  OT Treatments  $Self Care/Home Management  8-22 mins  Martie Round, OTR/L Acute Rehabilitation Services Pager: [phone] Office: [phone]

## 2020-10-16 NOTE — Progress Notes (Signed)
Inpatient Rehabilitation Admissions Coordinator   I have received insurance approval for CIR admit. I await medical workup completion to then plan CIR admit. I will follow up tomorrow.  Ottie Glazier, RN, MSN Rehab Admissions Coordinator [phone] 10/16/2020 6:19 PM

## 2020-10-17 ENCOUNTER — Other Ambulatory Visit: Payer: Self-pay

## 2020-10-17 ENCOUNTER — Inpatient Hospital Stay (HOSPITAL_COMMUNITY): Payer: Managed Care, Other (non HMO) | Admitting: Certified Registered Nurse Anesthetist

## 2020-10-17 ENCOUNTER — Encounter (HOSPITAL_COMMUNITY): Payer: Self-pay | Admitting: Physical Medicine & Rehabilitation

## 2020-10-17 ENCOUNTER — Encounter (HOSPITAL_COMMUNITY): Payer: Self-pay | Admitting: Neurology

## 2020-10-17 ENCOUNTER — Encounter (HOSPITAL_COMMUNITY)
Admission: EM | Disposition: A | Discharge status: Rehabilitation Facility | Payer: Self-pay | Source: Home / Self Care | Attending: Neurology

## 2020-10-17 ENCOUNTER — Inpatient Hospital Stay (HOSPITAL_COMMUNITY): Payer: Managed Care, Other (non HMO)

## 2020-10-17 ENCOUNTER — Inpatient Hospital Stay (HOSPITAL_COMMUNITY)
Admission: RE | Admit: 2020-10-17 | Discharge: 2020-10-26 | DRG: 057 | Disposition: A | Discharge status: Home / Self Care | Payer: Managed Care, Other (non HMO) | Source: Intra-hospital | Attending: Physical Medicine & Rehabilitation | Admitting: Physical Medicine & Rehabilitation

## 2020-10-17 DIAGNOSIS — I69392 Facial weakness following cerebral infarction: Secondary | ICD-10-CM

## 2020-10-17 DIAGNOSIS — I69354 Hemiplegia and hemiparesis following cerebral infarction affecting left non-dominant side: Principal | ICD-10-CM

## 2020-10-17 DIAGNOSIS — Q211 Atrial septal defect: Secondary | ICD-10-CM | POA: Diagnosis not present

## 2020-10-17 DIAGNOSIS — I69322 Dysarthria following cerebral infarction: Secondary | ICD-10-CM

## 2020-10-17 DIAGNOSIS — G8110 Spastic hemiplegia affecting unspecified side: Secondary | ICD-10-CM

## 2020-10-17 DIAGNOSIS — D72829 Elevated white blood cell count, unspecified: Secondary | ICD-10-CM

## 2020-10-17 DIAGNOSIS — G8194 Hemiplegia, unspecified affecting left nondominant side: Secondary | ICD-10-CM

## 2020-10-17 DIAGNOSIS — E785 Hyperlipidemia, unspecified: Secondary | ICD-10-CM | POA: Diagnosis present

## 2020-10-17 DIAGNOSIS — F419 Anxiety disorder, unspecified: Secondary | ICD-10-CM | POA: Diagnosis present

## 2020-10-17 DIAGNOSIS — K5900 Constipation, unspecified: Secondary | ICD-10-CM | POA: Diagnosis present

## 2020-10-17 DIAGNOSIS — D649 Anemia, unspecified: Secondary | ICD-10-CM | POA: Diagnosis present

## 2020-10-17 DIAGNOSIS — I639 Cerebral infarction, unspecified: Secondary | ICD-10-CM | POA: Diagnosis not present

## 2020-10-17 DIAGNOSIS — F17290 Nicotine dependence, other tobacco product, uncomplicated: Secondary | ICD-10-CM | POA: Diagnosis present

## 2020-10-17 DIAGNOSIS — I63511 Cerebral infarction due to unspecified occlusion or stenosis of right middle cerebral artery: Secondary | ICD-10-CM

## 2020-10-17 DIAGNOSIS — Z79899 Other long term (current) drug therapy: Secondary | ICD-10-CM | POA: Diagnosis not present

## 2020-10-17 DIAGNOSIS — Z818 Family history of other mental and behavioral disorders: Secondary | ICD-10-CM

## 2020-10-17 DIAGNOSIS — Z6829 Body mass index (BMI) 29.0-29.9, adult: Secondary | ICD-10-CM | POA: Diagnosis not present

## 2020-10-17 DIAGNOSIS — Z793 Long term (current) use of hormonal contraceptives: Secondary | ICD-10-CM

## 2020-10-17 DIAGNOSIS — G4700 Insomnia, unspecified: Secondary | ICD-10-CM | POA: Diagnosis present

## 2020-10-17 DIAGNOSIS — F411 Generalized anxiety disorder: Secondary | ICD-10-CM | POA: Diagnosis not present

## 2020-10-17 DIAGNOSIS — Z823 Family history of stroke: Secondary | ICD-10-CM | POA: Diagnosis not present

## 2020-10-17 DIAGNOSIS — E669 Obesity, unspecified: Secondary | ICD-10-CM | POA: Diagnosis present

## 2020-10-17 HISTORY — DX: Cerebral infarction due to unspecified occlusion or stenosis of right middle cerebral artery: I63.511

## 2020-10-17 HISTORY — PX: TEE WITHOUT CARDIOVERSION: SHX5443

## 2020-10-17 HISTORY — PX: BUBBLE STUDY: SHX6837

## 2020-10-17 LAB — CARDIOLIPIN ANTIBODIES, IGG, IGM, IGA
Anticardiolipin IgA: <9 APL U/mL (ref 0–11)
Anticardiolipin IgG: <9 GPL U/mL (ref 0–14)
Anticardiolipin IgM: 16 MPL U/mL — ABNORMAL HIGH (ref 0–12)

## 2020-10-17 LAB — FACTOR 5 LEIDEN

## 2020-10-17 SURGERY — ECHOCARDIOGRAM, TRANSESOPHAGEAL
Anesthesia: General

## 2020-10-17 MED ORDER — GERHARDT'S BUTT CREAM
TOPICAL_CREAM | Freq: Three times a day (TID) | CUTANEOUS | Status: DC
  Filled 2020-10-17: qty 1

## 2020-10-17 MED ORDER — FENTANYL CITRATE (PF) 250 MCG/5ML IJ SOLN
INTRAMUSCULAR | Status: DC | PRN
  Administered 2020-10-17: 50 ug via INTRAVENOUS

## 2020-10-17 MED ORDER — ATORVASTATIN CALCIUM 80 MG PO TABS: 80.0000 mg | ORAL_TABLET | Freq: Every day | ORAL | 0 refills | Status: DC

## 2020-10-17 MED ORDER — ONDANSETRON HCL 4 MG/2ML IJ SOLN
INTRAMUSCULAR | Status: DC | PRN
  Administered 2020-10-17: 4 mg via INTRAVENOUS

## 2020-10-17 MED ORDER — PANTOPRAZOLE SODIUM 40 MG PO TBEC
40.0000 mg | DELAYED_RELEASE_TABLET | Freq: Every day | ORAL | Status: DC
  Administered 2020-10-18 – 2020-10-26 (×9): 40 mg via ORAL
  Filled 2020-10-17 (×9): qty 1

## 2020-10-17 MED ORDER — POLYETHYLENE GLYCOL 3350 17 G PO PACK
17.0000 g | PACK | Freq: Every day | ORAL | Status: DC
  Administered 2020-10-18 – 2020-10-24 (×4): 17 g via ORAL
  Filled 2020-10-17 (×8): qty 1

## 2020-10-17 MED ORDER — SODIUM CHLORIDE 0.9 % IV SOLN: INTRAVENOUS | Status: DC

## 2020-10-17 MED ORDER — ATORVASTATIN CALCIUM 80 MG PO TABS
80.0000 mg | ORAL_TABLET | Freq: Every day | ORAL | Status: DC
  Administered 2020-10-18 – 2020-10-26 (×9): 80 mg via ORAL
  Filled 2020-10-17 (×9): qty 1

## 2020-10-17 MED ORDER — GERHARDT'S BUTT CREAM: 1.0000 "application " | TOPICAL_CREAM | Freq: Two times a day (BID) | CUTANEOUS | Status: DC

## 2020-10-17 MED ORDER — ACETAMINOPHEN 650 MG RE SUPP: 650.0000 mg | RECTAL | Status: DC | PRN

## 2020-10-17 MED ORDER — SENNOSIDES-DOCUSATE SODIUM 8.6-50 MG PO TABS: 1.0000 | ORAL_TABLET | Freq: Every evening | ORAL | Status: DC | PRN

## 2020-10-17 MED ORDER — LIDOCAINE 2% (20 MG/ML) 5 ML SYRINGE
INTRAMUSCULAR | Status: DC | PRN
  Administered 2020-10-17: 80 mg via INTRAVENOUS

## 2020-10-17 MED ORDER — DEXAMETHASONE SODIUM PHOSPHATE 10 MG/ML IJ SOLN
INTRAMUSCULAR | Status: DC | PRN
  Administered 2020-10-17: 8 mg via INTRAVENOUS

## 2020-10-17 MED ORDER — SUCCINYLCHOLINE CHLORIDE 200 MG/10ML IV SOSY
PREFILLED_SYRINGE | INTRAVENOUS | Status: DC | PRN
Start: 2020-10-17 — End: 2020-10-17
  Administered 2020-10-17: 160 mg via INTRAVENOUS

## 2020-10-17 MED ORDER — ACETAMINOPHEN 80 MG PO CHEW: 80.0000 mg | CHEWABLE_TABLET | Freq: Four times a day (QID) | ORAL | Status: DC | PRN

## 2020-10-17 MED ORDER — ALBUTEROL SULFATE (2.5 MG/3ML) 0.083% IN NEBU: 2.5000 mg | INHALATION_SOLUTION | RESPIRATORY_TRACT | 12 refills | Status: DC | PRN

## 2020-10-17 MED ORDER — PROPOFOL 10 MG/ML IV BOLUS
INTRAVENOUS | Status: DC | PRN
  Administered 2020-10-17: 200 mg via INTRAVENOUS

## 2020-10-17 MED ORDER — FENTANYL CITRATE (PF) 100 MCG/2ML IJ SOLN
INTRAMUSCULAR | Status: AC
  Filled 2020-10-17: qty 2

## 2020-10-17 MED ORDER — ACETAMINOPHEN 160 MG/5ML PO SOLN: 650.0000 mg | ORAL | Status: DC | PRN

## 2020-10-17 MED ORDER — ASPIRIN EC 81 MG PO TBEC
81.0000 mg | DELAYED_RELEASE_TABLET | Freq: Every day | ORAL | Status: DC
  Administered 2020-10-18 – 2020-10-26 (×9): 81 mg via ORAL
  Filled 2020-10-17 (×9): qty 1

## 2020-10-17 MED ORDER — ASPIRIN 81 MG PO TBEC: 81.0000 mg | DELAYED_RELEASE_TABLET | Freq: Every day | ORAL | 11 refills | Status: DC

## 2020-10-17 MED ORDER — MIDAZOLAM HCL (PF) 5 MG/ML IJ SOLN
INTRAMUSCULAR | Status: AC
  Filled 2020-10-17: qty 1

## 2020-10-17 MED ORDER — ACETAMINOPHEN 325 MG PO TABS
650.0000 mg | ORAL_TABLET | ORAL | Status: DC | PRN
  Administered 2020-10-18 – 2020-10-22 (×4): 650 mg via ORAL
  Filled 2020-10-17 (×4): qty 2

## 2020-10-17 MED ORDER — DOCUSATE SODIUM 100 MG PO CAPS
100.0000 mg | ORAL_CAPSULE | Freq: Two times a day (BID) | ORAL | Status: DC
  Administered 2020-10-17 – 2020-10-25 (×13): 100 mg via ORAL
  Filled 2020-10-17 (×18): qty 1

## 2020-10-17 MED ORDER — ALBUTEROL SULFATE (2.5 MG/3ML) 0.083% IN NEBU: 2.5000 mg | INHALATION_SOLUTION | RESPIRATORY_TRACT | Status: DC | PRN

## 2020-10-17 MED ORDER — MIDAZOLAM HCL 2 MG/2ML IJ SOLN
INTRAMUSCULAR | Status: DC | PRN
  Administered 2020-10-17: 1 mg via INTRAVENOUS

## 2020-10-17 NOTE — Anesthesia Postprocedure Evaluation (Signed)
Anesthesia Post Note  Patient: Kristen Grant  Procedure(s) Performed: TRANSESOPHAGEAL ECHOCARDIOGRAM (TEE) BUBBLE STUDY     Patient location during evaluation: PACU Anesthesia Type: General Level of consciousness: awake and alert Pain management: pain level controlled Vital Signs Assessment: post-procedure vital signs reviewed and stable Respiratory status: spontaneous breathing, nonlabored ventilation and respiratory function stable Cardiovascular status: stable and blood pressure returned to baseline Anesthetic complications: no   No notable events documented.  Last Vitals:  Vitals:   10/17/20 1325 10/17/20 1335  BP: 113/68 119/63  Pulse: 81 80  Resp: 14 12  Temp:    SpO2: 100% 100%    Last Pain:  Vitals:   10/17/20 1335  TempSrc:   PainSc: 0-No pain                 Beryle Lathe

## 2020-10-17 NOTE — Progress Notes (Signed)
Inpatient Rehabilitation Medication Review by a Pharmacist  A complete drug regimen review was completed for this patient to identify any potential clinically significant medication issues.  High Risk Drug Classes Is patient taking? Indication by Medication  Antipsychotic No   Anticoagulant No   Antibiotic No   Opioid Yes, as an intravenous medication Fentanyl IV: PRN pain  Antiplatelet Yes Aspirin: post-stroke ppx  Hypoglycemics/insulin No   Vasoactive Medication No   Chemotherapy No   Other Yes Acetaminophen: PRN pain Albuterol: PRN SOB Atorvastatin: hyperlipidemia Docusate: constipation ppx Ondansetron: PRN nausea Pantoprazole: GERD ppx PEG: constipation ppx Senokot-S: constipation ppx Topiramate: HA/migraine ppx     Type of Medication Issue Identified Description of Issue Recommendation(s)  Drug Interaction(s) (clinically significant)     Duplicate Therapy     Allergy     No Medication Administration End Date     Incorrect Dose     Additional Drug Therapy Needed     Significant med changes from prior encounter (inform family/care partners about these prior to discharge).    Other       Clinically significant medication issues were identified that warrant physician communication and completion of prescribed/recommended actions by midnight of the next day:  No  Name of provider notified for urgent issues identified: n/a  Pharmacist comments: n/a  Time spent performing this drug regimen review (minutes):  15 minutes   Thank you for allowing pharmacy to be a part of this patient's care.  Thelma Barge, PharmD Clinical Pharmacist

## 2020-10-17 NOTE — Progress Notes (Signed)
Meredith Staggers, MD   Physician  Physical Medicine and Rehabilitation  PMR Pre-admission     Signed  Date of Service:  10/14/2020 11:28 AM       Related encounter: ED to Hosp-Admission (Current) from 10/11/2020 in [facility]       Signed      Show:Clear all _0 Written_1 Templated_2 Copied  Added by: _3 Cristina Gong, RN_4 Lind Covert, Lauren Mamie Nick, CCC-SLP_5 Meredith Staggers, MD  _6 Hover for details                                                                                                                                                                                                                                                                                                                                                                                                                                                 PMR Admission Coordinator Pre-Admission Assessment   Patient: Kristen Grant is an 25 y.o., female MRN: [id number] DOB: Feb 14, 1995 Height: _7  (175.3 cm) Weight: 88.5 kg   Insurance Information HMO:     PPO:      PCP:      IPA:      80/20:      OTHER:  PRIMARY: [employer] Managed      Policy#: [id number]      Subscriber: patient CM Name: Butch Penny  Phone#: [phone] ext [phone]     Fax#: [phone] Pre-Cert#: [id number] approved through 9/20 when updates due to [place] [employer] fax [phone] phone [phone] ext [phone]      Employer: [employer] Benefits:  Phone #: [phone]     Name: 9/14 Eff. Date: 02/2020     Deduct: $7000      Out of Pocket Max: $7000      Life Max: none CIR: 70%      SNF: 70% Outpatient: 70%     Co-Pay: visits per medical neccesity Home Health: 70%      Co-Pay: visits per medical neccesity DME: 70%     Co-Pay: 30% Providers: in-network SECONDARY:       Policy#:       Phone#:    Development worker, community:       Phone#:    The Engineer, petroleum" for patients in Inpatient Rehabilitation Facilities with attached "Privacy Act [place] Records" was provided and verbally reviewed with: N/A   Emergency Contact Information Contact Information       Name Relation Home Work Mobile    [place] Mother [phone]               Current Medical History  Patient Admitting Diagnosis: R MCA, recurrent strokes   History of Present Illness: Kristen Grant is a 25 year old right-handed female with morbid obesity BMI 29.92, anxiety, migraine headaches, tobacco use.  Per chart review patient independent prior to admission working full-time.  She lives with her boyfriend and 32-year-old son.  Mother with good support.  Presented 10/11/2020 with acute onset of left-sided weakness, dysarthria and facial droop.  Cranial CT scan showed hypodensity in the right anterior temporal lobe concerning for infarct.  CT angiogram head and neck thrombus in the distal right M1 segment with near complete occlusion.  11 cc infarct core in the right corona radiata.  Admission chemistries alcohol negative, WBC 11,300, glucose 101, urine drug screen positive marijuana as well as benzo's.  Patient underwent mechanical thrombectomy 10/11/2020 with revascularization per interventional radiology requiring repeat thrombectomy 24 hours later for occluded dominant superior division of the right MCA achieving revascularization.  Latest MRI and imaging 10/13/2020 shows acute anterior right MCA territory infarct predominantly involving the right caudate putamen and anterior limb of the right internal capsule, insula, operculum and overlying right frontal lobe.  A few punctate acute infarcts in the right parietal lobe.  Associated cytotoxic edema with slight 2-3 mm leftward midline shift at the foramen of [place].  Probable small amount of hemorrhage in the posterior right frontal  lobe/insular region.  Echocardiogram with ejection fraction of 60 to 65% no wall motion abnormalities.  Hyper coag work-up negative.  ANA and RPR negative.  TEE is pending.  Patient was cleared to begin aspirin for CVA prophylaxis.  Lower extremity Dopplers negative for DVT.  Cleviprex initiated for blood pressure control.  Tolerating a regular diet.  Therapy evaluations completed due to patient's left-sided weakness and dysarthria was admitted for a comprehensive rehab program.   Complete NIHSS TOTAL: 4   Patient's medical record from [facility] has been reviewed by the rehabilitation admission coordinator and physician.   Past Medical History      Past Medical History:  Diagnosis Date   Anxiety     Asthma      no treatments required at this time   Migraine headache      Has the patient had major surgery during 100 days prior to admission?  Yes   Family History   family history includes Anxiety disorder in her mother; Depression in her mother; Diabetes in her maternal grandmother; Epilepsy in her mother; Hypertension in her maternal grandmother and mother; Migraines in her mother; Stroke in her maternal grandmother.   Current Medications   Current Facility-Administered Medications:    [MAR Hold]  stroke: mapping our early stages of recovery book, , Does not apply, Once, Kerney Elbe, MD   0.9 %  sodium chloride infusion, 250 mL, Intravenous, Continuous, Rollene Rotunda, John D, PA-C   0.9 %  sodium chloride infusion, , Intravenous, Continuous, Cheryln Manly, NP   [MAR Hold] acetaminophen (TYLENOL) tablet 650 mg, 650 mg, Oral, Q4H PRN, 650 mg at 10/16/20 0837 **OR** [MAR Hold] acetaminophen (TYLENOL) 160 MG/5ML solution 650 mg, 650 mg, Per Tube, Q4H PRN, 650 mg at 10/14/20 0626 **OR** [MAR Hold] acetaminophen (TYLENOL) suppository 650 mg, 650 mg, Rectal, Q4H PRN, Deveshwar, Willaim Rayas, MD   [MAR Hold] albuterol (PROVENTIL) (2.5 MG/3ML) 0.083% nebulizer solution 2.5 mg, 2.5 mg,  Nebulization, Q4H PRN, Rollene Rotunda, John D, PA-C   [MAR Hold] aspirin EC tablet 81 mg, 81 mg, Oral, Daily, Dennison Mascot, PA-C, 81 mg at 10/16/20 1214   [MAR Hold] atorvastatin (LIPITOR) tablet 80 mg, 80 mg, Oral, Daily, Dennison Mascot, PA-C, 80 mg at 10/16/20 1214   [MAR Hold] Chlorhexidine Gluconate Cloth 2 % PADS 6 each, 6 each, Topical, Daily, Kerney Elbe, MD, 6 each at 10/16/20 1330   clevidipine (CLEVIPREX) infusion 0.5 mg/mL, 0-21 mg/hr, Intravenous, Continuous, Mick Sell, PA-C   [facility] Hold] docusate sodium (COLACE) capsule 100 mg, 100 mg, Oral, BID, Mick Sell, PA-C, 100 mg at 10/16/20 2204   [facility] Hold] Gerhardt's butt cream, , Topical, TID, Garvin Fila, MD   [facility] Hold] morphine 2 MG/ML injection 2 mg, 2 mg, Intravenous, Q4H PRN, Dennison Mascot, PA-C, 2 mg at 10/13/20 0932   [MAR Hold] norepinephrine (LEVOPHED) 19m in 2558mpremix infusion, 2-10 mcg/min, Intravenous, Titrated, PaMick SellPA-C, Stopped at 10/14/20 0536   [MAR Hold] ondansetron (ZOFRAN) injection 4 mg, 4 mg, Intravenous, Q6H PRN, FrDennison MascotPA-C, 4 mg at 10/17/20 0730   [MAR Hold] pantoprazole (PROTONIX) injection 40 mg, 40 mg, Intravenous, Daily, PaMick SellPA-C, 40 mg at 10/16/20 1215   [MAR Hold] polyethylene glycol (MIRALAX / GLYCOLAX) packet 17 g, 17 g, Oral, Daily, PaMick SellPA-C, 17 g at 10/15/20 0935   [MAR Hold] senna-docusate (Senokot-S) tablet 1 tablet, 1 tablet, Oral, QHS PRN, LiKerney ElbeMD   verapamil (ISOPTIN) injection, , , PRN, DeLuanne BrasMD, 2.5 mg at 10/12/20 1739   Patients Current Diet:  Diet Order                  Diet NPO time specified  Diet effective midnight                     NPO for TEE on 9/15 On Regular diet with thin liquids prior ; whole meds with puree per SLP eval on 9/12   Precautions / Restrictions Precautions Precautions: Fall Precaution Comments: left hemiparesis Restrictions Weight Bearing Restrictions: No    Has the  patient had 2 or more falls or a fall with injury in the past year? No   Prior Activity Level Community (5-7x/wk): works   Prior Functional Level Self Care: Did the patient need help bathing, dressing, using the toilet or eating? Independent   Indoor  Mobility: Did the patient need assistance with walking from room to room (with or without device)? Independent   Stairs: Did the patient need assistance with internal or external stairs (with or without device)? Independent   Functional Cognition: Did the patient need help planning regular tasks such as shopping or remembering to take medications? Independent   Patient Information Are you of Hispanic, Latino/a,or Spanish origin?: A. No, not of Hispanic, Latino/a, or Spanish origin What is your race?: A. White Do you need or want an interpreter to communicate with a doctor or health care staff?: 0. No   Patient's Response To:  Health Literacy and Transportation Is the patient able to respond to health literacy and transportation needs?: Yes Health Literacy - How often do you need to have someone help you when you read instructions, pamphlets, or other written material from your doctor or pharmacy?: Never In the past 12 months, has lack of transportation kept you from medical appointments or from getting medications?: No In the past 12 months, has lack of transportation kept you from meetings, work, or from getting things needed for daily living?: No   Development worker, international aid / [place] Devices/Equipment: None Home Equipment: None   Prior Device Use: Indicate devices/aids used by the patient prior to current illness, exacerbation or injury? None of the above   Current Functional Level Cognition   Overall Cognitive Status: Within Functional Limits for tasks assessed Orientation Level: Oriented X4 General Comments: Continue with flat affect, but very appreciative of session, at times smiling. Following commands  appropriately    Extremity Assessment (includes Sensation/Coordination)   Upper Extremity Assessment: LUE deficits/detail RUE Deficits / Details: 2/5 shoulder, 2+/5 elbow (limited by IV site),3-/5 forearm, 2/5 hand, reports numbness in fingers. No functional use. LUE Deficits / Details: no active movement, but noted to shape hand on EVA walker and hand does not fall off lap, lags behind pt with bed mobility, pt reports it is completely numb today vs eval when it was only her fingertips LUE Coordination: decreased fine motor, decreased gross motor  Lower Extremity Assessment: Defer to PT evaluation RLE Deficits / Details: pt with limited hip and knee flexion actively due to pain in RT groin at sheath site. otherwise grossly functional for sensation and strength RLE Sensation: WNL RLE Coordination: WNL LLE Deficits / Details: pt able to sense light touch but reports numbness compared to RLE, knee flexion 2-/5, hip flexion 2-/5, 3/5 ankle dorsiflexion/plantarflexion LLE Coordination: decreased fine motor, decreased gross motor     ADLs   Overall ADL's : Needs assistance/impaired Eating/Feeding: Minimal assistance, Sitting Grooming: Wash/dry hands, Set up, Sitting, Brushing hair, Total assistance Grooming Details (indicate cue type and reason): washing face somewhat perseveratively, total assist for knots out of her hair and to braid Upper Body Bathing: Moderate assistance, Sitting Lower Body Bathing: Total assistance, +2 for physical assistance, Sit to/from stand Upper Body Dressing : Moderate assistance, Sitting Lower Body Dressing: Total assistance, +2 for physical assistance, Sit to/from stand Toileting- Clothing Manipulation and Hygiene: Minimal assistance, Sit to/from stand Functional mobility during ADLs: Minimal assistance (EVA walker) General ADL Comments: Pt educated in hemitechniques for UB bathing and dressing.     Mobility   Overal bed mobility: Needs Assistance Bed Mobility:  Supine to Sit Rolling: Mod assist Sidelying to sit: Mod assist, +2 for physical assistance Supine to sit: Min assist, HOB elevated Sit to supine: Mod assist, +2 for physical assistance General bed mobility comments: Reliant on HOB elevated and  use of bed rail, improved ability to manage LLE to EOB without UE assist, ultimately requiring minA to scoot L hip to EOB     Transfers   Overall transfer level: Needs assistance Equipment used: 2 person hand held assist Transfers: Sit to/from Stand Sit to Stand: Min assist, +2 safety/equipment General transfer comment: Pt frequently reaching for RUE support to pull to stand, requiring cues to push with RUE, requiring minA for trunk elevation and stability; totalA for LUE management     Ambulation / Gait / Stairs / Wheelchair Mobility   Ambulation/Gait Ambulation/Gait assistance: Mod assist, +2 safety/equipment Gait Distance (Feet): 10 Feet (+ 60' + 50' + 40') Assistive device: 2 person hand held assist, 1 person hand held assist, 4-wheeled walker Gait Pattern/deviations: Step-to pattern, Decreased dorsiflexion - left, Decreased weight shift to left General Gait Details: Slow, unsteady gait with initial bilateral HHA, dependent for LUE management, reliant on RUE support and modA for stability; additional gait trials with eva walker to encourage weight bearing through LUE, as well as single LUE support, pt fluctuating min-modA for stability; inital improved ability to take complete steps with L foot, reliant on increased hip flex to clear foot, worsening LLE dragging with fatigue Gait velocity: Decreased     Posture / Balance Dynamic Sitting Balance Sitting balance - Comments: Difficulty reaching outside of BOS to reach feet with RUE Balance Overall balance assessment: Needs assistance Sitting-balance support: Single extremity supported, Feet supported Sitting balance-Leahy Scale: Fair Sitting balance - Comments: Difficulty reaching outside of BOS  to reach feet with RUE Postural control: Left lateral lean, Posterior lean Standing balance support: During functional activity, Bilateral upper extremity supported, Single extremity supported Standing balance-Leahy Scale: Poor Standing balance comment: Reliant on UE support and/or external assist     Special needs/care consideration Likely elective PFO closure after rehab.    Previous Home Environment  Living Arrangements: Children, Spouse/significant other  Lives With: Son, Significant other Available Help at Discharge: Family, Available 24 hours/day Type of Home: House Home Layout: One level Home Access: Stairs to enter Entrance Stairs-Rails: None Entrance Stairs-Number of Steps: 2-3 Bathroom Shower/Tub: Optometrist: Yes How Accessible: Accessible via walker [place]: No   Discharge Living Setting Plans for Discharge Living Setting: Patient's home Type of Home at Discharge: House Discharge Home Layout: One level Discharge Home Access: Stairs to enter Entrance Stairs-Rails: None Entrance Stairs-Number of Steps: 2-3 Discharge Bathroom Shower/Tub: Tub/shower unit Discharge Bathroom Toilet: Standard Discharge Bathroom Accessibility: Yes How Accessible: Accessible via walker Does the patient have any problems obtaining your medications?: No   Social/Family/Support Systems Patient Roles: Parent, Partner Anticipated Caregiver: Marlane Hatcher, mother and Fannie Knee, boyfriend Anticipated Caregiver's Contact Information: Katharine Look: [phone] Caregiver Availability: 24/7 Discharge Plan Discussed with Primary Caregiver: Yes Is Caregiver In Agreement with Plan?: Yes Does Caregiver/Family have Issues with Lodging/Transportation while Pt is in Rehab?: No   Goals Patient/Family Goal for Rehab: Mod I to supervision with PT, supervision to min with OT, Mod I ot superivsion with SLP Expected length of stay: ELOS 10 to 14  days Pt/Family Agrees to Admission and willing to participate: Yes Program Orientation Provided & Reviewed with Pt/Caregiver Including Roles  & Responsibilities: Yes   Decrease burden of Care through IP rehab admission: NA   Possible need for SNF placement upon discharge: Not nictitated   Patient Condition: I have reviewed medical records from [facility], spoken with CM, and patient and family member. I met with  patient at the bedside for inpatient rehabilitation assessment.  Patient will benefit from ongoing PT, OT, and SLP, can actively participate in 3 hours of therapy a day 5 days of the week, and can make measurable gains during the admission.  Patient will also benefit from the coordinated team approach during an Inpatient Acute Rehabilitation admission.  The patient will receive intensive therapy as well as Rehabilitation physician, nursing, social worker, and care management interventions.  Due to bladder management, bowel management, safety, skin/wound care, disease management, medication administration, pain management, and patient education the patient requires 24 hour a day rehabilitation nursing.  The patient is currently mod assist overall with mobility and basic ADLs.  Discharge setting and therapy post discharge at home with home health is anticipated.  Patient has agreed to participate in the Acute Inpatient Rehabilitation Program and will admit today.   Preadmission Screen Completed By:  Danne Baxter RN MSN, 10/17/2020 11:43 AM ______________________________________________________________________   Discussed status with Dr. Naaman Plummer on  10/17/2020 at 31 and received approval for admission today.   Admission Coordinator:  Danne Baxter RN MSN, time 1150 Date 10/17/2020    Assessment/Plan: Diagnosis: right MCA infarct Does the need for close, 24 hr/day Medical supervision in concert with the patient's rehab needs make it unreasonable for this patient to be served in a  less intensive setting? Yes Co-Morbidities requiring supervision/potential complications: obesity, anxiety d/o, migraines Due to bladder management, bowel management, safety, skin/wound care, disease management, medication administration, pain management, and patient education, does the patient require 24 hr/day rehab nursing? Yes Does the patient require coordinated care of a physician, rehab nurse, PT, OT, and SLP to address physical and functional deficits in the context of the above medical diagnosis(es)? Yes Addressing deficits in the following areas: balance, endurance, locomotion, strength, transferring, bowel/bladder control, bathing, dressing, feeding, grooming, toileting, cognition, speech, and psychosocial support Can the patient actively participate in an intensive therapy program of at least 3 hrs of therapy 5 days a week? Yes The potential for patient to make measurable gains while on inpatient rehab is excellent Anticipated functional outcomes upon discharge from inpatient rehab: modified independent and supervision PT, modified independent and supervision OT, modified independent SLP Estimated rehab length of stay to reach the above functional goals is: 10-14 days Anticipated discharge destination: Home 10. Overall Rehab/Functional Prognosis: excellent     MD Signature: Meredith Staggers, MD, [place] [facility] 10/17/2020          Revision History                                    Note Details  Author Meredith Staggers, MD File Time 10/17/2020 12:20 PM  Author Type Physician Status Signed  Last Editor Meredith Staggers, MD Service Physical Medicine and Rehabilitation

## 2020-10-17 NOTE — Progress Notes (Signed)
PA Pam made aware of pt mood assessment score (18) Score 15-19 = Moderately severe. No new orders at this time. Mylo Red, LPN

## 2020-10-17 NOTE — Progress Notes (Signed)
  Echocardiogram Echocardiogram Transesophageal has been performed.  Kristen Grant F 10/17/2020, 1:35 PM

## 2020-10-17 NOTE — Progress Notes (Signed)
Inpatient Rehabilitation Admissions Coordinator   I met with patient and her Mom at bedside. We have insurance approval and CIR bed to admit her to today. They are in agreement. I have alerted Dr Leonie Man, acute team and TOC. I will make the arrangements to admit today.  Danne Baxter, RN, MSN Rehab Admissions Coordinator [phone] 10/17/2020 2:22 PM

## 2020-10-17 NOTE — Discharge Summary (Signed)
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 Active Problems:   Middle cerebral artery embolism, right   Long term current use of hormonal contraceptive Left hemiparesis Patent foramen ovale Hyperlipidemia   Medications to be continued on Rehab Allergies as of 10/17/2020   No Known Allergies      Medication List     STOP taking these medications    medroxyPROGESTERone 150 MG/ML injection Commonly known as: DEPO-PROVERA   norethindrone 0.35 MG tablet Commonly known as: MICRONOR       TAKE these medications    albuterol (2.5 MG/3ML) 0.083% nebulizer solution Commonly known as: PROVENTIL Take 3 mLs (2.5 mg total) by nebulization every 4 (four) hours as needed for wheezing or shortness of breath.   aspirin 81 MG EC tablet Take 1 tablet (81 mg total) by mouth daily. Swallow whole.   atorvastatin 80 MG tablet Commonly known as: LIPITOR Take 1 tablet (80 mg total) by mouth daily.   Gerhardt's butt cream Crea Apply 1 application topically 2 (two) times daily.        LABORATORY STUDIES CBC    Component Value Date/Time   WBC 13.4 (H) 10/13/2020 0308   RBC 3.85 (L) 10/13/2020 0308   HGB 11.6 (L) 10/13/2020 0308   HGB 14.9 03/19/2013 0807   HCT 34.4 (L) 10/13/2020 0308   HCT 42.5 03/19/2013 0807   PLT 290 10/13/2020 0308   PLT 304 03/19/2013 0807   MCV 89.4 10/13/2020 0308   MCV 87 03/19/2013 0807   MCH 30.1 10/13/2020 0308   MCHC 33.7 10/13/2020 0308   RDW 13.9 10/13/2020 0308   RDW 13.5 03/19/2013 0807    LYMPHSABS 1.3 10/13/2020 0308   MONOABS 0.8 10/13/2020 0308   EOSABS 0.0 10/13/2020 0308   BASOSABS 0.0 10/13/2020 0308   CMP    Component Value Date/Time   NA 140 10/13/2020 0308   NA 140 03/19/2013 0807   K 3.6 10/13/2020 0308   K 3.7 03/19/2013 0807   CL 112 (H) 10/13/2020 0308   CL 109 (H) 03/19/2013 0807   CO2 21 (L) 10/13/2020 0308   CO2 22 03/19/2013 0807   GLUCOSE 139 (H) 10/13/2020 0308   GLUCOSE 105 (H) 03/19/2013 0807   BUN 6 10/13/2020 0308   BUN 8 (L) 03/19/2013 0807   CREATININE 0.45 10/13/2020 0308   CREATININE 0.57 (L) 03/19/2013 0807   CALCIUM 8.4 (L) 10/13/2020 0308   CALCIUM 9.0 03/19/2013 0807   PROT 5.7 (L) 10/13/2020 0308   PROT 8.2 03/19/2013 0807   ALBUMIN 3.3 (L) 10/13/2020 0308   ALBUMIN 4.3 03/19/2013 0807   AST 14 (L) 10/13/2020 0308   AST 23 03/19/2013 0807   ALT 14 10/13/2020 0308   ALT 38 03/19/2013 0807   ALKPHOS 37 (L) 10/13/2020 0308   ALKPHOS 95 03/19/2013 0807   BILITOT 1.3 (H) 10/13/2020 0308   BILITOT 0.6 03/19/2013 0807   GFRNONAA >60 10/13/2020 0308   GFRAA >60 12/30/2017 2122   COAGS Lab Results  Component Value Date   INR 1.0 10/11/2020  Lipid Panel    Component Value Date/Time   CHOL 171 10/12/2020 0018   TRIG 55 10/12/2020 0018   HDL 43 10/12/2020 0018   CHOLHDL 4.0 10/12/2020 0018   VLDL 11 10/12/2020 0018   LDLCALC 117 (H) 10/12/2020 0018   HgbA1C  Lab Results  Component Value Date   HGBA1C 5.0 10/12/2020   Urinalysis    Component Value Date/Time   COLORURINE YELLOW 10/11/2020 2219   APPEARANCEUR CLEAR 10/11/2020 2219   APPEARANCEUR Clear 03/19/2013 0919   LABSPEC 1.025 10/11/2020 2219   LABSPEC 1.055 03/19/2013 0919   PHURINE 6.5 10/11/2020 2219   GLUCOSEU NEGATIVE 10/11/2020 2219   GLUCOSEU Negative 03/19/2013 0919   HGBUR TRACE (A) 10/11/2020 2219   BILIRUBINUR NEGATIVE 10/11/2020 2219   BILIRUBINUR Negative 03/19/2013 0919   KETONESUR NEGATIVE 10/11/2020 2219   PROTEINUR NEGATIVE 10/11/2020  2219   NITRITE NEGATIVE 10/11/2020 2219   LEUKOCYTESUR NEGATIVE 10/11/2020 2219   LEUKOCYTESUR Negative 03/19/2013 0919   Urine Drug Screen     Component Value Date/Time   LABOPIA NONE DETECTED 10/11/2020 2219   COCAINSCRNUR NONE DETECTED 10/11/2020 2219   COCAINSCRNUR NEGATIVE 03/19/2013 0919   LABBENZ POSITIVE (A) 10/11/2020 2219   AMPHETMU NONE DETECTED 10/11/2020 2219   THCU POSITIVE (A) 10/11/2020 2219   LABBARB NONE DETECTED 10/11/2020 2219    Alcohol Level    Component Value Date/Time   ETH <10 10/11/2020 0936     SIGNIFICANT DIAGNOSTIC STUDIES CT HEAD WO CONTRAST ( )  Result Date: 10/12/2020 CLINICAL DATA:  Follow-up examination for acute stroke, status post thrombectomy. EXAM: CT HEAD WITHOUT CONTRAST TECHNIQUE: Contiguous axial images were obtained from the base of the skull through the vertex without intravenous contrast. COMPARISON:  Prior CTA from earlier the same day. FINDINGS: Brain: Loss of gray-white matter differentiation involving the right insular region as well as the overlying right frontal operculum. Fogging at the right caudate and lentiform nuclei present as well. Findings consistent with evolving right MCA distribution infarct. Superimposed hyperdensity at the level of the right sylvian fissure could reflect contrast staining or possibly small amount of subarachnoid hemorrhage (series 2, image 20). No significant mass effect at this time. Remainder the brain is otherwise normal in appearance. No other hemorrhage or acute large vessel territory infarct. No mass lesion or midline shift. No hydrocephalus. No extra-axial fluid collection. Vascular: No visible hyperdense vessel. Skull: Scalp soft tissues and calvarium demonstrate no new abnormality. Sinuses/Orbits: Globes and orbital soft tissues within normal limits. Scattered mucosal thickening in opacity present within the sphenoid ethmoidal and maxillary sinuses with a few scattered retention cyst. Mastoid air  cells are clear. Other: None. IMPRESSION: 1. Evolving acute right MCA distribution infarct. Superimposed hyperdensity at the level of the right Sylvian fissure could reflect contrast staining or possibly small amount of subarachnoid hemorrhage. Short interval follow-up CT suggested. No significant mass effect at this time. 2. No other new acute intracranial abnormality. These results were communicated to Dr. Wilford Corner at 8:55 pm on 10/12/2020 by text page via the [facility] messaging system. Electronically Signed   By: Rise Mu M.D.   On: 10/12/2020 20:56   MR BRAIN WO CONTRAST  Result Date: 10/13/2020 CLINICAL DATA:  Stroke, follow up EXAM: MRI HEAD WITHOUT CONTRAST TECHNIQUE: Multiplanar, multiecho pulse sequences of the brain and surrounding structures were obtained without intravenous contrast. COMPARISON:  CT head 10/12/2020. FINDINGS: Brain: Acute infarct involving the anterior right MCA territory, predominantly including the caudate, putamen, anterior limb of the right internal capsule,  insula, operculum and overlying right frontal lobe. A few punctate acute infarcts in the right parietal lobe. Associated cytotoxic edema with slight (2-3 mm) leftward midline shift at the foramen of Monro. Small amount of susceptibility artifact in the posterior right frontal lobe/insular region, suggestive of small volume hemorrhage. No hydrocephalus. No extra-axial fluid collection. Vascular: Major arterial flow voids are maintained at the skull base. Skull and upper cervical spine: Normal marrow signal. Sinuses/Orbits: Right sphenoid sinus retention cyst. Otherwise, largely clear sinuses. Unremarkable orbits. Other: No mastoid effusions. IMPRESSION: 1. Acute anterior right MCA territory infarct, predominantly involving the right caudate, putamen, anterior limb of the right internal capsule, insula, operculum and overlying right frontal lobe. A few punctate acute infarcts in the right parietal lobe. 2. Associated  cytotoxic edema with slight (2-3 mm) leftward midline shift at the foramen of Monro. 3. Probable small amount of hemorrhage in the posterior right frontal lobe/insular region. Electronically Signed   By: Feliberto Harts M.D.   On: 10/13/2020 14:38   VAS [place] TRANSCRANIAL DOPPLER W BUBBLES  Result Date: 10/15/2020  Transcranial Doppler with Bubble Patient Name:  Kristen Grant  Date of Exam:   10/14/2020 Medical Rec #: [id number]       Accession #:    [id number] Date of Birth: [birth year]        Patient Gender: F Patient Age:   25 years Exam Location:  [facility] Procedure:      VAS [place] TRANSCRANIAL DOPPLER W BUBBLES Referring Phys: Rolonda Pontarelli --------------------------------------------------------------------------------  Indications: Stroke. Performing Technologist: [place] Ponder RVS  Examination Guidelines: A complete evaluation includes B-mode imaging, spectral Doppler, color Doppler, and power Doppler as needed of all accessible portions of each vessel. Bilateral testing is considered an integral part of a complete examination. Limited examinations for reoccurring indications may be performed as noted.  Summary:  A vascular evaluation was performed. The right middle cerebral artery was studied. An IV was inserted into the patient's right forearm . Verbal informed consent was obtained.  HITS heard at rest: spencer 3 HITS heard during valsalva: Partial curtain Positive TCD Bubble study indicative of right to left medium size shunt at rest and with valsalva *See table(s) above for TCD measurements and observations.  Diagnosing physician: Delia Heady MD Electronically signed by Delia Heady MD on 10/15/2020 at 12:47:16 PM.    Final    ECHOCARDIOGRAM COMPLETE  Result Date: 10/11/2020    ECHOCARDIOGRAM REPORT   Patient Name:   Kristen Grant Date of Exam: 10/11/2020 Medical Rec #:  [id number]      Height:       67.0 in Accession #:    [id number]     Weight:       208.6 lb Date of Birth:  [birth year]        BSA:          2.059 m Patient Age:    25 years       BP:           124/76 mmHg Patient Gender: F              HR:           76 bpm. Exam Location:  Inpatient Procedure: 2D Echo Indications:    stroke  History:        Patient has no prior history of Echocardiogram examinations.  Sonographer:    Delcie Roch RDCS Referring Phys: [phone] ERIC LINDZEN IMPRESSIONS  1. Left ventricular ejection fraction, by estimation, is  60 to 65%. The left ventricle has normal function. The left ventricle has no regional wall motion abnormalities. Left ventricular diastolic parameters were normal.  2. Right ventricular systolic function is normal. The right ventricular size is normal. Tricuspid regurgitation signal is inadequate for assessing PA pressure.  3. The mitral valve is normal in structure. No evidence of mitral valve regurgitation. No evidence of mitral stenosis.  4. The aortic valve was not well visualized. Aortic valve regurgitation is not visualized. No aortic stenosis is present. Conclusion(s)/Recommendation(s): No intracardiac source of embolism detected on this transthoracic study. A transesophageal echocardiogram is recommended to exclude cardiac source of embolism if clinically indicated. FINDINGS  Left Ventricle: Left ventricular ejection fraction, by estimation, is 60 to 65%. The left ventricle has normal function. The left ventricle has no regional wall motion abnormalities. The left ventricular internal cavity size was normal in size. There is  no left ventricular hypertrophy. Left ventricular diastolic parameters were normal. Right Ventricle: The right ventricular size is normal. No increase in right ventricular wall thickness. Right ventricular systolic function is normal. Tricuspid regurgitation signal is inadequate for assessing PA pressure. Left Atrium: Left atrial size was normal in size. Right Atrium: Right atrial size was normal in size. Pericardium: There is no evidence of pericardial effusion. Mitral  Valve: The mitral valve is normal in structure. No evidence of mitral valve regurgitation. No evidence of mitral valve stenosis. Tricuspid Valve: The tricuspid valve is normal in structure. Tricuspid valve regurgitation is trivial. Aortic Valve: The aortic valve was not well visualized. Aortic valve regurgitation is not visualized. No aortic stenosis is present. Pulmonic Valve: The pulmonic valve was not well visualized. Pulmonic valve regurgitation is not visualized. Aorta: The aortic root and ascending aorta are structurally normal, with no evidence of dilitation. IAS/Shunts: The interatrial septum was not well visualized.  LEFT VENTRICLE PLAX 2D LVIDd:         4.50 cm  Diastology LVIDs:         2.90 cm  LV e' medial:    10.60 cm/s LV PW:         0.90 cm  LV E/e' medial:  7.6 LV IVS:        0.80 cm  LV e' lateral:   15.90 cm/s LVOT diam:     1.90 cm  LV E/e' lateral: 5.0 LV SV:         62 LV SV Index:   30 LVOT Area:     2.84 cm  RIGHT VENTRICLE             IVC RV S prime:     12.90 cm/s  IVC diam: 1.10 cm TAPSE (M-mode): 2.3 cm LEFT ATRIUM             Index       RIGHT ATRIUM          Index LA diam:        3.20 cm 1.55 cm/m  RA Area:     9.88 cm LA Vol (A2C):   31.7 ml 15.40 ml/m RA Volume:   19.60 ml 9.52 ml/m LA Vol (A4C):   22.5 ml 10.93 ml/m LA Biplane Vol: 26.8 ml 13.02 ml/m  AORTIC VALVE LVOT Vmax:   126.00 cm/s LVOT Vmean:  81.500 cm/s LVOT VTI:    0.217 m  AORTA Ao Root diam: 2.80 cm Ao Asc diam:  2.60 cm MITRAL VALVE MV Area (PHT): 4.49 cm    SHUNTS MV Decel Time: 169 msec  Systemic VTI:  0.22 m MV E velocity: 80.10 cm/s  Systemic Diam: 1.90 cm MV A velocity: 51.80 cm/s MV E/A ratio:  1.55 Epifanio Lesches MD Electronically signed by Epifanio Lesches MD Signature Date/Time: 10/11/2020/10:54:58 PM    Final    CT HEAD CODE STROKE WO CONTRAST  Result Date: 10/11/2020 CLINICAL DATA:  Code stroke.  Weakness EXAM: CT HEAD WITHOUT CONTRAST TECHNIQUE: Contiguous axial images were obtained from  the base of the skull through the vertex without intravenous contrast. COMPARISON:  None. FINDINGS: Brain: There is hypodensity in the right temporal lobe. The remainder of the brain parenchyma is normal in appearance. There is no evidence of acute intracranial hemorrhage. There is no mass lesion. There is no midline shift. Vascular: There is a hyperdense vessel sign on the right. Skull: Normal. Negative for fracture or focal lesion. Sinuses/Orbits: There are mucous retention cysts in the bilateral maxillary and right sphenoid sinuses. The globes and orbits are unremarkable. Other: None. ASPECTS [facility] Stroke Program Early CT Score) - Ganglionic level infarction (caudate, lentiform nuclei, internal capsule, insula, M1-M3 cortex): 3 - Supraganglionic infarction (M4-M6 cortex): 7 Total score (0-10 with 10 being normal): 10 IMPRESSION: 1. Hypodensity in the right anterior temporal lobe is concerning for infarct given hyperdense vessel sign on the right. Vascular imaging has already been performed at the time of dictation. 2. ASPECTS is 10 These results were called by telephone at the time of interpretation on 10/11/2020 at 9:55 am to provider DR Otelia Limes, who verbally acknowledged these results. Electronically Signed   By: Lesia Hausen M.D.   On: 10/11/2020 09:56   VAS [place] LOWER EXTREMITY VENOUS (DVT)  Result Date: 10/14/2020  Lower Venous DVT Study Patient Name:  Kristen Grant  Date of Exam:   10/14/2020 Medical Rec #: [id number]       Accession #:    [id number] Date of Birth: [birth year]        Patient Gender: F Patient Age:   25 years Exam Location:  [facility] Procedure:      VAS [place] LOWER EXTREMITY VENOUS (DVT) Referring Phys: Hilda Lias FRANCOIS --------------------------------------------------------------------------------  Indications: Stroke.  Limitations: Bandages. Comparison Study: no prior Performing Technologist: [place] Ponder RVS  Examination Guidelines: A complete evaluation includes B-mode imaging,  spectral Doppler, color Doppler, and power Doppler as needed of all accessible portions of each vessel. Bilateral testing is considered an integral part of a complete examination. Limited examinations for reoccurring indications may be performed as noted. The reflux portion of the exam is performed with the patient in reverse Trendelenburg.  +---------+---------------+---------+-----------+----------+-------------------+ RIGHT    CompressibilityPhasicitySpontaneityPropertiesThrombus Aging      +---------+---------------+---------+-----------+----------+-------------------+ CFV                                                   not visualized due                                                        to bandages         +---------+---------------+---------+-----------+----------+-------------------+ FV Prox  Full                                                             +---------+---------------+---------+-----------+----------+-------------------+  FV Mid   Full                                                             +---------+---------------+---------+-----------+----------+-------------------+ FV DistalFull                                                             +---------+---------------+---------+-----------+----------+-------------------+ PFV      Full                                                             +---------+---------------+---------+-----------+----------+-------------------+ POP      Full           Yes      Yes                                      +---------+---------------+---------+-----------+----------+-------------------+ PTV      Full                                                             +---------+---------------+---------+-----------+----------+-------------------+ PERO     Full                                                              +---------+---------------+---------+-----------+----------+-------------------+   +---------+---------------+---------+-----------+----------+--------------+ LEFT     CompressibilityPhasicitySpontaneityPropertiesThrombus Aging +---------+---------------+---------+-----------+----------+--------------+ CFV      Full           Yes      Yes                                 +---------+---------------+---------+-----------+----------+--------------+ SFJ      Full                                                        +---------+---------------+---------+-----------+----------+--------------+ FV Prox  Full                                                        +---------+---------------+---------+-----------+----------+--------------+ FV Mid   Full                                                        +---------+---------------+---------+-----------+----------+--------------+  FV DistalFull                                                        +---------+---------------+---------+-----------+----------+--------------+ PFV      Full                                                        +---------+---------------+---------+-----------+----------+--------------+ POP      Full           Yes      Yes                                 +---------+---------------+---------+-----------+----------+--------------+ PTV      Full                                                        +---------+---------------+---------+-----------+----------+--------------+ PERO     Full                                                        +---------+---------------+---------+-----------+----------+--------------+     Summary: BILATERAL: - No evidence of deep vein thrombosis seen in the lower extremities, bilaterally. -No evidence of popliteal cyst, bilaterally.   *See table(s) above for measurements and observations. Electronically signed by Lemar Livings MD on 10/14/2020 at  11:00:27 PM.    Final    CT ANGIO HEAD NECK W WO CM W PERF (CODE STROKE)  Result Date: 10/12/2020 CLINICAL DATA:  Neuro deficit, acute, stroke suspected EXAM: CT ANGIOGRAPHY HEAD AND NECK CT PERFUSION BRAIN TECHNIQUE: Multidetector CT imaging of the head and neck was performed using the standard protocol during bolus administration of intravenous contrast. Multiplanar CT image reconstructions and MIPs were obtained to evaluate the vascular anatomy. Carotid stenosis measurements (when applicable) are obtained utilizing NASCET criteria, using the distal internal carotid diameter as the denominator. Multiphase CT imaging of the brain was performed following IV bolus contrast injection. Subsequent parametric perfusion maps were calculated using RAPID software. CONTRAST:  OMNIPAQUE IOHEXOL 350 MG/ML SOLN COMPARISON:  10/11/2020. FINDINGS: CT HEAD FINDINGS Brain: Hypoattenuation and loss of gray-white differentiation in the right MCA territory, including the right frontal lobe, right insula, internal capsule right lentiform nucleus, caudate, and inferior right frontal cortex/operculum. Slight midline shift. No acute hemorrhage, hydrocephalus, mass lesion, or visible extra-axial fluid collection. ASPECTS [facility] Stroke Program Early CT Score) - Ganglionic level infarction (caudate, lentiform nuclei, internal capsule, insula, M1-M3 cortex): 1 - Supraganglionic infarction (M4-M6 cortex): 3 Total score (0-10 with 10 being normal): 4 Vascular: See below. Skull: No acute fracture. Sinuses: No acute findings. Orbits: No acute findings. Review of the MIP images confirms the above findings CTA NECK FINDINGS Aortic arch: Great vessel origins are patent. Right carotid system: No evidence of dissection, stenosis (50% or  greater) or occlusion. Left carotid system: No evidence of dissection, stenosis (50% or greater) or occlusion. Vertebral arteries: Mildly left dominant. No significant (greater than 50%) stenosis. Skeleton:  No acute abnormality. Other neck: No acute abnormality. CTA HEAD FINDINGS Anterior circulation: Bilateral ICAs and M1 MCAs are patent. Occlusion a of an anterior right M2 MCA branch (see series 11, images 237 through 240). Bilateral ACAs are patent. Posterior circulation: Bilateral intradural vertebral arteries basilar artery and posterior cerebral arteries are patent without proximal high-grade stenosis. Venous: No evidence of dural sinus thrombosis. CT Brain Perfusion Findings: ASPECTS: 4 CBF (<30%) Volume: 55mL Perfusion (Tmax>6.0s) volume: 63mL Mismatch Volume: 8mL Infarction Location:Right MCA territory IMPRESSION: CT Head: 1. 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 ASPECTS is 4. Slight leftward midline shift. 2. No acute hemorrhage. CTA: 1. Proximal right M2 MCA occlusion. 2. No significant stenosis in the neck. CT Perfusion: Large area of core infarct in the right MCA territory, measuring at least 55 mL and likely underestimated given superior extent is not imaged. Small (8 mL) area of ischemic penumbra. Findings discussed with Dr. Otelia Limes via telephone at 4:00 PM Electronically Signed   By: Feliberto Harts M.D.   On: 10/12/2020 16:26   CT ANGIO HEAD NECK W WO CM W PERF (CODE STROKE)  Result Date: 10/11/2020 EXAM: CT ANGIOGRAPHY HEAD AND NECK CT PERFUSION BRAIN TECHNIQUE: Multidetector CT imaging of the head and neck was performed using the standard protocol during bolus administration of intravenous contrast. Multiplanar CT image reconstructions and MIPs were obtained to evaluate the vascular anatomy. Carotid stenosis measurements (when applicable) are obtained utilizing NASCET criteria, using the distal internal carotid diameter as the denominator. Multiphase CT imaging of the brain was performed following IV bolus contrast injection. Subsequent parametric perfusion maps were  calculated using RAPID software. CONTRAST:  OMNIPAQUE IOHEXOL 350 MG/ML SOLN COMPARISON:  Same-day noncontrast CT head FINDINGS: CTA NECK FINDINGS Aortic arch: Standard branching. Imaged portion shows no evidence of aneurysm or dissection. No significant stenosis of the major arch vessel origins. Right carotid system: No evidence of dissection, stenosis (50% or greater) or occlusion. Left carotid system: No evidence of dissection, stenosis (50% or greater) or occlusion. Vertebral arteries: The left vertebral artery is dominant, a normal variant. The vertebral arteries are patent, without hemodynamically significant stenosis, occlusion, or dissection. Skeleton: The bones are normal. Other neck: The soft tissues are normal. Upper chest: The lung apices are clear. Review of the MIP images confirms the above findings CTA HEAD FINDINGS Anterior circulation: The bilateral intracranial ICAs are patent. There is thrombus in the distal right M1 segment with near complete occlusion. There is reconstitution of flow in the superior M2 division but no inferior M2 division is identified. The left MCA and bilateral ACAs are patent. There is no aneurysm. Posterior circulation: The V4 segments of the vertebral arteries are patent. The basilar artery is patent. The bilateral PCAs are patent. Venous sinuses: Not evaluated due to contrast timing. Anatomic variants: None. Review of the MIP images confirms the above findings CT Brain Perfusion Findings: CBF (<30%) Volume: 11mL Perfusion (Tmax>6.0s) volume: 86mL Mismatch Volume: 75mL Infarction Location:Right corona radiata IMPRESSION: 1. Thrombus in the distal right M1 segment with near complete occlusion. There is reconstitution of flow in superior M2 division but no inferior M2 division is identified. 2. 11 cc infarct core in the right corona radiata, 75 cc penumbra. These  results were called by telephone at the time of interpretation on 10/11/2020 at 9:58 am to provider Dr  Otelia Limes, who verbally acknowledged these results. Electronically Signed   By: Lesia Hausen M.D.   On: 10/11/2020 10:10       HISTORY OF PRESENT ILLNESS Kristen Grant is an 25 y.o. female who presents to the ED via EMS as a Code Stroke after her boyfriend noted her to suddenly become weak on her left side shortly after waking up this morning. LKN was 0815 after waking. She was still lying in bed when her partner noticed that the left side of her face was drooping, she was drooling and the left side of her body was weak. On EMS arrival, they noted same. CBG was 106. On arrival to the ED, she had slight right gaze deviation, left facial droop, dysarthria and inability to move/lift her LUE and LLE. She was able to slowly answer questions. She was complaining of a right sided headache.   STAT CT head revealed dense right M1 and M2 segments with equivocal early hypodensity in the right temporal lobe.    LSN: [id number] TNK Given: Yes NIHSS: 14 mRS: 0    DISCHARGE EXAM Blood pressure 125/74, pulse 67, temperature (!) 97 F (36.1 C), temperature source Temporal, resp. rate 12, height 5\' 9"  (1.753 m), weight 88.5 kg, SpO2 99 %.   Constitutional: Obese  Caucasian lady not in distress. Psych: Affect appropriate to situation. She has a persistently flat affect, and often is not engaged in conversation.  Eyes: No scleral injection HENT: No OP obstrucion MSK: no joint deformities.  Cardiovascular: Normal rate and regular rhythm.  Respiratory: Effort normal, non-labored breathing GI: Soft.  No distension. There is no tenderness.  Skin: WDI  Neuro: Mental Status: Patient is awake, alert, oriented to person, place, month, year, and situation.  Patient is minimally dysarthric at time of exam, but is able to be understood in conversation.  No signs of aphasia or neglect   Cranial Nerves: II: Visual Fields are full. Pupils are equal, round, and reactive to light.    III,IV, VI: EOMI without ptosis or  diploplia.  V: Facial sensation is asymmetric to temperature/ decreased sensation at left hemiface VII: Facial movement is asymmetric resting and smiling.  Moderate left lower facial weakness VIII: Hearing is intact to voice X: Palate elevates symmetrically XI: Shoulder shrug is asymmetric. XII: Tongue protrudes midline without atrophy or fasciculations.  Motor: Tone is normal. Bulk is normal. 5/5 strength was present only at right arm.  Left upper extremity strength is grade 2/5.  Right leg is able to be lifted off bed without difficulty. Left leg is able to lift off bed for 5 count  Sensory: Sensation is asymmetric to light touch and temperature in the arms and legs.   Plantars: Toes are downgoing bilaterally.   Cerebellar: FNF and HKS are intact bilaterally  Premorbid baseline modified Rankin scale 0 Discharge Diet   Regular diet   HOSPITAL COURSE Ms. Kristen Grant is a 25 y.o. female with history of anxiety, asthma, on birth control who presents to the ED via EMS as a Code Stroke after her boyfriend noted her to suddenly become weak on her left side shortly after waking up this morning. LKN was 0815 after waking. She was still lying in bed when her partner noticed that the left side of her face was drooping, she was drooling and the left side of her body was weak. On EMS  arrival, they noted same. CBG was 106. On arrival to the ED, she had slight right gaze deviation, left facial droop, dysarthria and inability to move/lift her LUE and LLE. She was able to slowly answer questions. She was complaining of a right sided headache.    STAT CT head revealed dense right M1 and M2 segments with equivocal early hypodensity in the right temporal lobe. she was taken on 9/9 for revascularization of right MCA then had reocclusion of same on 9/10 and underwent repeat angioplasty on 9/10. Developed persistent left hemiplegia. CTH on 9/10 post angioplasty showed possible small amount of subarachnoid  hemorrhage versus contrast staining.    To acute rehab. Working etiology: PFO, although remains cryptogenic. Can consider 30 day loop recorder at outpatient appointment in stroke clinic .    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  Status posttreatment with IV TNKase and mechanical thrombectomy initially on 10/11/2020  with TICI 3 revascularizationand requiring repeat thrombectomy 24 hours later for occluded dominant superior division of the right MCA achieving TICI2B -2C revascularization   Code Stroke  CT head hypodensity at right anterior temporal lobe.   ASPECTS 10.     CTA head & neck:   1. Thrombus in the distal right M1 segment with near complete occlusion. There is reconstitution of flow in superior M2 division but no inferior M2 division is identified. 2. 11 cc infarct core in the right corona radiata, 75 cc penumbra. CT perfusion : CBF (<30%) Volume: 75mL Perfusion (Tmax>6.0s) volume: 73mL Mismatch Volume: 74mL Infarction Location:Right corona radiata Cerebral angio : report from Dr. Corliss Skains  bilateral common carotid and Lt VA angiograms followed by complete revascularization of occluded LT MCA M1 seg with x pass with contact aspiration and x1 pass with solitaire 4mm x 40 mm retriever and contact aspiration achieving a TICI 3 revascularization. Post CT brain No ICH . 5.MRI BRAIN:   Acute anterior right MCA territory infarct, predominantly involving the right caudate, putamen, anterior limb of the right internal capsule, insula, operculum and overlying right frontal lobe. A few punctate acute infarcts in the right parietal lobe.  Associated cytotoxic edema with slight (2-3 mm) leftward midline shift at the foramen of Monro. Probable small amount of hemorrhage in the posterior right frontal lobe/insular region. 6. 2D Echo LVEF 60-65%, normal LA, poorly visualized interatrial septum  7. 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 11Continue dual antiplatelet therapy .  12Lower extremity venous Dopplers negative for DVT 13For planned TEE on 10/17/20, once performed ok for d/c to acute rehab.  14 Elective consideration for endovascular PFO closure after finishing rehab.  Long discussion with the patient and boyfriend at the bedside  about her condition and evaluation and treatment plan and answered questions 9/14.   No antithrombotic prior to admission, now on aspirin 81mg  daily Therapy recommendations:   CIR Disposition:  CIR    Hyperlipidemia Home meds:   none LDL 117, goal < 70 Added lipitor 80mg  daily   High intensity statin   Continue statin at discharge     Other Stroke Risk Factors  Birth control use : counseled not to use hormonal birth control and to instead utilize mechanical barrier ppx and to see gyn for alternatives long term.   Substance abuse - UDS:  THC POSITIVE, Cocaine NONE DETECTED. Patient advised to stop using due to stroke risk.  Obesity, Body  mass index is 29.63 kg/m., BMI >/= 30 associated with increased stroke risk, recommend weight loss, diet and exercise as appropriate   Family hx stroke (grandmother, multiple aunts)  DISCHARGE PLAN Disposition:  Transfer to [facility] for ongoing PT, OT and ST aspirin 81 mg daily and atorvastatin  for secondary stroke prevention  Recommend ongoing stroke risk factor control by Primary Care Physician at time of discharge from inpatient rehabilitation. Follow-up PCP Patient, No Pcp Per (Inactive) in 2 weeks following discharge from rehab. Follow-up in [facility] in 4 weeks following discharge from rehab, office to schedule an appointment.   40 minutes were spent preparing discharge.  Sanjuana Letters, PA-C Neurology   I have personally obtained history,examined this patient, reviewed notes, independently viewed  imaging studies, participated in medical decision making and plan of care.ROS completed by me personally and pertinent positives fully documented  I have made any additions or clarifications directly to the above note. Agree with note above.    Delia Heady, MD Medical Director [facility] Pager: [phone] 10/17/2020 6:13 PM  .To contact Stroke Continuity provider, please refer to [email or web address]. After hours, contact General Neurology

## 2020-10-17 NOTE — Anesthesia Preprocedure Evaluation (Addendum)
Anesthesia Evaluation  Patient identified by MRN, date of birth, ID band Patient awake    Reviewed: Allergy & Precautions, NPO status , Patient's Chart, lab work & pertinent test results  Airway Mallampati: II  TM Distance: >3 FB Neck ROM: Full    Dental  (+) Teeth Intact, Dental Advisory Given   Pulmonary asthma , Current Smoker and Patient abstained from smoking.,    breath sounds clear to auscultation       Cardiovascular  Rhythm:Regular Rate:Normal     Neuro/Psych  Headaches, PSYCHIATRIC DISORDERS Anxiety CVA (left sided weakness), Residual Symptoms    GI/Hepatic Neg liver ROS, GERD  Poorly Controlled,  Endo/Other  negative endocrine ROS  Renal/GU negative Renal ROS  negative genitourinary   Musculoskeletal negative musculoskeletal ROS (+)   Abdominal Normal abdominal exam  (+)   Peds negative pediatric ROS (+)  Hematology negative hematology ROS (+)   Anesthesia Other Findings Pt is a 25 y.o. female admitted 10/11/20 with c/o L sided weakness and facial droop. Head CT 9/9 with hypodensity in R anterior temporal lobe infarct. CTA 9/9 with thrombus in distal R M1 segment with near complete occlusion, 11cc infarct R corona radiata, 75 cc penumbra. Pt s/p R MCA mechanical thrombectomy with revascularization on 9/9. Pt with recurrent symptoms requiring return to IR for repeat R MCA thrombectomy 9/10. Brain MRI 9/11 shows acute anterior R MCA infarcts, associated cytotoxic edema with slight (2-33mm) midline shift. TCD bubble study 9/12 positive for medium-sized right to left shunt  Reproductive/Obstetrics                         Anesthesia Physical  Anesthesia Plan  ASA: 4  Anesthesia Plan: General   Post-op Pain Management:    Induction: Intravenous, Rapid sequence and Cricoid pressure planned  PONV Risk Score and Plan: 2 and Ondansetron, Treatment may vary due to age or medical condition and  Dexamethasone  Airway Management Planned: Oral ETT  Additional Equipment:   Intra-op Plan:   Post-operative Plan: Extubation in OR  Informed Consent: I have reviewed the patients History and Physical, chart, labs and discussed the procedure including the risks, benefits and alternatives for the proposed anesthesia with the patient or authorized representative who has indicated his/her understanding and acceptance.     Dental advisory given  Plan Discussed with: CRNA and Anesthesiologist  Anesthesia Plan Comments:      Anesthesia Quick Evaluation

## 2020-10-17 NOTE — Interval H&P Note (Signed)
History and Physical Interval Note:  10/17/2020 11:09 AM  Kristen Grant  has presented today for surgery, with the diagnosis of STROKE.  The various methods of treatment have been discussed with the patient and family. After consideration of risks, benefits and other options for treatment, the patient has consented to  Procedure(s): TRANSESOPHAGEAL ECHOCARDIOGRAM (TEE) (N/A) as a surgical intervention.  The patient's history has been reviewed, patient examined, no change in status, stable for surgery.  I have reviewed the patient's chart and labs.  Questions were answered to the patient's satisfaction.     [employer]

## 2020-10-17 NOTE — Progress Notes (Signed)
PT Cancellation Note  Patient Details Name: Kristen Grant MRN: [id number] DOB: [birth year]   Cancelled Treatment:    Reason Eval/Treat Not Completed: Patient discharging to CIR this afternoon, will defer treatment.  Ina Homes, PT, DPT Acute Rehabilitation Services  Pager [phone] Office [phone]  Malachy Chamber 10/17/2020, 4:12 PM

## 2020-10-17 NOTE — CV Procedure (Signed)
Please see TEE report in results for details.  Donato Schultz, MD

## 2020-10-17 NOTE — H&P (Signed)
Physical Medicine and Rehabilitation Admission H&P        Chief Complaint  Patient presents with   Code Stroke  : HPI: Kristen Grant is a 25 year old right-handed female with morbid obesity BMI 29.92, anxiety, migraine headaches, tobacco use.  Per chart review patient independent prior to admission working full-time.  She lives with her boyfriend and 68-year-old son.  Mother with good support.  Presented 10/11/2020 with acute onset of left-sided weakness, dysarthria and facial droop.  Cranial CT scan showed hypodensity in the right anterior temporal lobe concerning for infarct.  CT angiogram head and neck thrombus in the distal right M1 segment with near complete occlusion.  11 cc infarct core in the right corona radiata.  Admission chemistries alcohol negative, WBC 11,300, glucose 101, urine drug screen positive marijuana as well as benzos.  Patient underwent mechanical thrombectomy 10/11/2020 with revascularization per interventional radiology requiring repeat thrombectomy 24 hours later for occluded dominant superior division of the right MCA achieving revascularization.  Latest MRI and imaging 10/13/2020 shows acute anterior right MCA territory infarct predominantly involving the right caudate putamen and anterior limb of the right internal capsule, insula, operculum and overlying right frontal lobe.  A few punctate acute infarcts in the right parietal lobe.  Associated cytotoxic edema with slight 2-3 mm leftward midline shift at the foramen of Monro.  Probable small amount of hemorrhage in the posterior right frontal lobe/insular region.  Echocardiogram with ejection fraction of 60 to 65% no wall motion abnormalities.  Hyper coag work-up negative.  ANA and RPR negative.  TEE is pending.  Patient was cleared to begin aspirin for CVA prophylaxis.  Lower extremity Dopplers negative for DVT.  Cleviprex initiated for blood pressure control.  Tolerating a regular diet.  Therapy evaluations completed due to  patient's left-sided weakness and dysarthria was admitted for a comprehensive rehab program.   Review of Systems  Constitutional:  Negative for chills and fever.  HENT:  Negative for hearing loss.   Eyes:  Negative for blurred vision and double vision.  Respiratory:  Negative for cough and shortness of breath.   Cardiovascular:  Negative for chest pain, palpitations and leg swelling.  Gastrointestinal:  Positive for constipation. Negative for heartburn, nausea and vomiting.  Genitourinary:  Negative for dysuria, flank pain and hematuria.  Skin:  Negative for rash.  Neurological:  Positive for speech change, weakness and headaches.  Psychiatric/Behavioral:  The patient has insomnia.        Anxiety  All other systems reviewed and are negative.     Past Medical History:  Diagnosis Date   Anxiety     Asthma      no treatments required at this time   Migraine headache           Past Surgical History:  Procedure Laterality Date   NO PAST SURGERIES       RADIOLOGY WITH ANESTHESIA N/A 10/12/2020    Procedure: RADIOLOGY WITH ANESTHESIA;  Surgeon: Radiologist, Medication, MD;  Location: [facility] OR;  Service: Radiology;  Laterality: N/A;   RADIOLOGY WITH ANESTHESIA N/A 10/11/2020    Procedure: IR WITH ANESTHESIA;  Surgeon: Radiologist, Medication, MD;  Location: [facility] OR;  Service: Radiology;  Laterality: N/A;         Family History  Problem Relation Age of Onset   Hypertension Mother     Epilepsy Mother     Anxiety disorder Mother     Depression Mother     Migraines Mother  Diabetes Maternal Grandmother     Stroke Maternal Grandmother     Hypertension Maternal Grandmother     Breast cancer Neg Hx      Social History:  reports that she has been smoking e-cigarettes. She started smoking about 20 months ago. She has never used smokeless tobacco. She reports that she does not currently use alcohol. She reports current drug use. Frequency: 3.00 times per week. Drug: Marijuana. Allergies: No  Known Allergies       Medications Prior to Admission  Medication Sig Dispense Refill   medroxyPROGESTERone (DEPO-PROVERA) 150 MG/ML injection Inject 1 mL (150 mg total) into the muscle every 3 (three) months. 1 mL 0   norethindrone (MICRONOR) 0.35 MG tablet Take 1 tablet (0.35 mg total) by mouth daily for 28 days. (Patient not taking: No sig reported) 28 tablet 12      Drug Regimen Review Drug regimen was reviewed and remains appropriate with no significant issues identified   Home: Home Living Family/patient expects to be discharged to:: Private residence Living Arrangements: Children, Spouse/significant other Available Help at Discharge: Family, Available 24 hours/day Type of Home: House Home Access: Stairs to enter [employer] of Steps: 2-3 Entrance Stairs-Rails: None Home Layout: One level Bathroom Shower/Tub: Engineer, manufacturing systems: Standard Bathroom Accessibility: Yes Home Equipment: None  Lives With: Son, Significant other   Functional History: Prior Function Level of Independence: Independent Comments: 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 Needs to be on her feet all day at either place. 5 yo son who just started school   Functional Status:  Mobility: Bed Mobility Overal bed mobility: Needs Assistance Bed Mobility: Rolling, Sidelying to Sit, Sit to Supine Rolling: Mod assist Sidelying to sit: Mod assist, +2 for physical assistance Sit to supine: Mod assist, +2 for physical assistance General bed mobility comments: repeated cues for each movement but pt able to complete movements with BLE to move to EOB, then needing modA of 2 to complete elevation of trunk from [facility]. minA initially for static sitting, pt then able to maintain withotu assist. modA of 2 to return to sitting. pt again needing repeated sequential cues for movement of each LE and repositioning Transfers Overall transfer level: Needs  assistance Equipment used: 2 person hand held assist Transfers: Sit to/from Stand Sit to Stand: Mod assist, Min assist, +2 physical assistance General transfer comment: initially modA with L knee block, assist at hips to facilitate hip extension and to center balance (pt with L lateral lean). Pt then able to progress to +2 minA with assist to steady at L knee. pt able to demo good control with direct cues. completed x5 in session Ambulation/Gait Ambulation/Gait assistance: Mod assist, +2 physical assistance Gait Distance (Feet): 3 Feet Assistive device: 2 person hand held assist Gait Pattern/deviations: Step-to pattern, Decreased stride length, Decreased weight shift to left General Gait Details: pt unable to lift or advance LLE without assist to wt shift to R and to advance LLE. taking L lateral steps along EOB. pt needs L knee blocked with stance for safety, no instances of L knee buckling at this time Gait velocity: decreased   ADL: ADL Overall ADL's : Needs assistance/impaired Eating/Feeding: Minimal assistance, Sitting Grooming: Minimal assistance, Sitting Upper Body Bathing: Moderate assistance, Sitting Lower Body Bathing: Total assistance, +2 for physical assistance, Sit to/from stand Upper Body Dressing : Moderate assistance, Sitting Lower Body Dressing: Total assistance, +2 for physical assistance, Sit to/from stand Toileting- Clothing  Manipulation and Hygiene: Total assistance, +2 for physical assistance, Bed level   Cognition: Cognition Overall Cognitive Status: Within Functional Limits for tasks assessed Orientation Level: Oriented X4 Cognition Arousal/Alertness: Awake/alert Behavior During Therapy: Flat affect Overall Cognitive Status: Within Functional Limits for tasks assessed General Comments: pt with slight continued anxiety, but mostly flat affect at this time. needing repeated cues to complete tasks at times.   Physical Exam: Blood pressure 105/61, pulse 77,  temperature 98.7 F (37.1 C), temperature source Oral, resp. rate 10, height 5\' 9"  (1.753 m), weight 91.9 kg, SpO2 98 %. Physical Exam Constitutional:      Appearance: Normal appearance.  HENT:     Head: Normocephalic and atraumatic.     Nose: Nose normal.     Mouth/Throat:     Mouth: Mucous membranes are moist.     Pharynx: Oropharynx is clear.  Eyes:     Extraocular Movements: Extraocular movements intact.     Pupils: Pupils are equal, round, and reactive to light.  Cardiovascular:     Rate and Rhythm: Normal rate and regular rhythm.     Pulses: Normal pulses.  Pulmonary:     Effort: Pulmonary effort is normal. No respiratory distress.     Breath sounds: No wheezing.  Abdominal:     General: Bowel sounds are normal. There is no distension.     Palpations: Abdomen is soft.  Musculoskeletal:        General: No swelling or deformity. Normal range of motion.     Cervical back: Normal range of motion.  Skin:    General: Skin is warm and dry.  Neurological:     Mental Status: She is alert.     Comments: Patient is alert.  Makes eye contact with examiner.  Fair insight and awareness.  Provides name and age.  Speech is mildly dysarthric. Mild left central 7. Fair insight and awareness. Mild left inattention. Senses pain and light touch in all 4. LUE 1 to 1+/5 prox to distal. LLE 3- to 3/5 prox to distal. No resting tone. DTR's 1+  Psychiatric:     Comments: Pt cooperative but anxious.       Lab Results Last 48 Hours        Results for orders placed or performed during the hospital encounter of 10/11/20 (from the past 48 hour(s))  Glucose, capillary     Status: None    Collection Time: 10/13/20  7:55 PM  Result Value Ref Range    Glucose-Capillary 95 70 - 99 mg/dL      Comment: Glucose reference range applies only to samples taken after fasting for at least 8 hours.       Imaging Results (Last 48 hours)  MR BRAIN WO CONTRAST   Result Date: 10/13/2020 CLINICAL DATA:  Stroke,  follow up EXAM: MRI HEAD WITHOUT CONTRAST TECHNIQUE: Multiplanar, multiecho pulse sequences of the brain and surrounding structures were obtained without intravenous contrast. COMPARISON:  CT head 10/12/2020. FINDINGS: Brain: Acute infarct involving the anterior right MCA territory, predominantly including the caudate, putamen, anterior limb of the right internal capsule, insula, operculum and overlying right frontal lobe. A few punctate acute infarcts in the right parietal lobe. Associated cytotoxic edema with slight (2-3 mm) leftward midline shift at the foramen of Monro. Small amount of susceptibility artifact in the posterior right frontal lobe/insular region, suggestive of small volume hemorrhage. No hydrocephalus. No extra-axial fluid collection. Vascular: Major arterial flow voids are maintained at the skull base. Skull and upper  cervical spine: Normal marrow signal. Sinuses/Orbits: Right sphenoid sinus retention cyst. Otherwise, largely clear sinuses. Unremarkable orbits. Other: No mastoid effusions. IMPRESSION: 1. Acute anterior right MCA territory infarct, predominantly involving the right caudate, putamen, anterior limb of the right internal capsule, insula, operculum and overlying right frontal lobe. A few punctate acute infarcts in the right parietal lobe. 2. Associated cytotoxic edema with slight (2-3 mm) leftward midline shift at the foramen of Monro. 3. Probable small amount of hemorrhage in the posterior right frontal lobe/insular region. Electronically Signed   By: Feliberto Harts M.D.   On: 10/13/2020 14:38    VAS [place] TRANSCRANIAL DOPPLER W BUBBLES   Result Date: 10/14/2020  Transcranial Doppler with Bubble Patient Name:  YEMARIAM MAGAR  Date of Exam:   10/14/2020 Medical Rec #: [id number]       Accession #:    [id number] Date of Birth: [birth year]        Patient Gender: F Patient Age:   25 years Exam Location:  [facility] Procedure:      VAS [place] TRANSCRANIAL DOPPLER W BUBBLES Referring  Phys: PRAMOD SETHI --------------------------------------------------------------------------------  Indications: Stroke. Performing Technologist: [place] Ponder RVS  Examination Guidelines: A complete evaluation includes B-mode imaging, spectral Doppler, color Doppler, and power Doppler as needed of all accessible portions of each vessel. Bilateral testing is considered an integral part of a complete examination. Limited examinations for reoccurring indications may be performed as noted.  Summary:  A vascular evaluation was performed. The right middle cerebral artery was studied. An IV was inserted into the patient's right forearm . Verbal informed consent was obtained.  HITS heard at rest: spencer 3 HITS heard during valsalva: Partial curtain *See table(s) above for TCD measurements and observations.    Preliminary     VAS [place] LOWER EXTREMITY VENOUS (DVT)   Result Date: 10/14/2020  Lower Venous DVT Study Patient Name:  RHIANA MORASH  Date of Exam:   10/14/2020 Medical Rec #: [id number]       Accession #:    [id number] Date of Birth: 04-19-1995        Patient Gender: F Patient Age:   25 years Exam Location:  [facility] Procedure:      VAS [place] LOWER EXTREMITY VENOUS (DVT) Referring Phys: Hilda Lias FRANCOIS --------------------------------------------------------------------------------  Indications: Stroke.  Limitations: Bandages. Comparison Study: no prior Performing Technologist: Argentina Ponder RVS  Examination Guidelines: A complete evaluation includes B-mode imaging, spectral Doppler, color Doppler, and power Doppler as needed of all accessible portions of each vessel. Bilateral testing is considered an integral part of a complete examination. Limited examinations for reoccurring indications may be performed as noted. The reflux portion of the exam is performed with the patient in reverse Trendelenburg.  +---------+---------------+---------+-----------+----------+-------------------+ RIGHT     CompressibilityPhasicitySpontaneityPropertiesThrombus Aging      +---------+---------------+---------+-----------+----------+-------------------+ CFV                                                   not visualized due                                                        to bandages         +---------+---------------+---------+-----------+----------+-------------------+  FV Prox  Full                                                             +---------+---------------+---------+-----------+----------+-------------------+ FV Mid   Full                                                             +---------+---------------+---------+-----------+----------+-------------------+ FV DistalFull                                                             +---------+---------------+---------+-----------+----------+-------------------+ PFV      Full                                                             +---------+---------------+---------+-----------+----------+-------------------+ POP      Full           Yes      Yes                                      +---------+---------------+---------+-----------+----------+-------------------+ PTV      Full                                                             +---------+---------------+---------+-----------+----------+-------------------+ PERO     Full                                                             +---------+---------------+---------+-----------+----------+-------------------+   +---------+---------------+---------+-----------+----------+--------------+ LEFT     CompressibilityPhasicitySpontaneityPropertiesThrombus Aging +---------+---------------+---------+-----------+----------+--------------+ CFV      Full           Yes      Yes                                 +---------+---------------+---------+-----------+----------+--------------+ SFJ      Full                                                         +---------+---------------+---------+-----------+----------+--------------+ FV Prox  Full                                                        +---------+---------------+---------+-----------+----------+--------------+  FV Mid   Full                                                        +---------+---------------+---------+-----------+----------+--------------+ FV DistalFull                                                        +---------+---------------+---------+-----------+----------+--------------+ PFV      Full                                                        +---------+---------------+---------+-----------+----------+--------------+ POP      Full           Yes      Yes                                 +---------+---------------+---------+-----------+----------+--------------+ PTV      Full                                                        +---------+---------------+---------+-----------+----------+--------------+ PERO     Full                                                        +---------+---------------+---------+-----------+----------+--------------+     Summary: BILATERAL: - No evidence of deep vein thrombosis seen in the lower extremities, bilaterally. -No evidence of popliteal cyst, bilaterally.   *See table(s) above for measurements and observations. Electronically signed by Lemar Livings MD on 10/14/2020 at 11:00:27 PM.    Final              Medical Problem List and Plan: 1.  Left-sided weakness with dysarthria secondary to right MCA right MCA territory infarct predominantly right caudate putamen anterior limb right internal capsule and overlying right frontal lobe secondary to right M1 occlusion status post revascularization mechanical thrombectomy 10/11/2020 as well as requiring repeat thrombectomy 10/12/2020 for occluded dominant superior division of right MCA. CVA of cryptogenic origin. TEE really  unremarkable. ? Small PFO             -patient may  shower             -ELOS/Goals: 10-14 days, goals mod I to supervision 2.  Antithrombotics: -DVT/anticoagulation:  Mechanical: Sequential compression devices, below knee Bilateral lower extremities             -antiplatelet therapy: Aspirin 81 mg daily 3. Pain Management: Tylenol as needed 4. Mood/anxiety : Team to Provide emotional support             -antipsychotic agents: N/A  -add low dose xanax for anxity  5. Neuropsych: This patient is capable of making decisions on her own behalf. 6. Skin/Wound Care: Routine skin checks 7. Fluids/Electrolytes/Nutrition: Routine in and outs with follow-up chemistries 8.  Hyperlipidemia.  Lipitor 9.  Urine drug screen positive marijuana.  Counseling 10.  Obesity.  BMI 29.92.  Dietary follow-up       Charlton Amor, PA-C 10/15/2020   I have personally performed a face to face diagnostic evaluation of this patient and formulated the key components of the plan.  Additionally, I have personally reviewed laboratory data, imaging studies, as well as relevant notes and concur with the physician assistant's documentation above.  The patient's status has not changed from the original H&P.  Any changes in documentation from the acute care chart have been noted above.  Ranelle Oyster, MD, [place]

## 2020-10-17 NOTE — Anesthesia Procedure Notes (Signed)
Procedure Name: Intubation Date/Time: 10/17/2020 12:24 PM Performed by: Inda Coke, CRNA Pre-anesthesia Checklist: Patient identified, Emergency Drugs available, Suction available and Patient being monitored Patient Re-evaluated:Patient Re-evaluated prior to induction Oxygen Delivery Method: Circle System Utilized Preoxygenation: Pre-oxygenation with 100% oxygen Induction Type: IV induction and Rapid sequence Ventilation: Mask ventilation without difficulty Laryngoscope Size: Mac and 3 Grade View: Grade I Tube type: Oral Tube size: 7.0 mm Number of attempts: 1 Airway Equipment and Method: Stylet and Oral airway Placement Confirmation: ETT inserted through vocal cords under direct vision, positive ETCO2 and breath sounds checked- equal and bilateral Secured at: 22 cm Tube secured with: Tape Dental Injury: Teeth and Oropharynx as per pre-operative assessment

## 2020-10-17 NOTE — Progress Notes (Signed)
Pt arrived to 4M09 per wheelchair. Pt oriented to rehab and policies reviewed. Pt in agreement. Vitals obtained, assessment complete. No complications noted. Mylo Red, LPN

## 2020-10-17 NOTE — Transfer of Care (Signed)
Immediate Anesthesia Transfer of Care Note  Patient: Kristen Grant  Procedure(s) Performed: TRANSESOPHAGEAL ECHOCARDIOGRAM (TEE) BUBBLE STUDY  Patient Location: Endoscopy Unit  Anesthesia Type:General  Level of Consciousness: awake and drowsy  Airway & Oxygen Therapy: Patient Spontanous Breathing and Patient connected to nasal cannula oxygen  Post-op Assessment: Report given to RN and Post -op Vital signs reviewed and stable  Post vital signs: Reviewed and stable  Last Vitals:  Vitals Value Taken Time  BP 126/57 10/17/20 1313  Temp    Pulse 87 10/17/20 1314  Resp 16 10/17/20 1314  SpO2 100 % 10/17/20 1314  Vitals shown include unvalidated device data.  Last Pain:  Vitals:   10/17/20 1126  TempSrc: Temporal  PainSc: 0-No pain      Patients Stated Pain Goal: 1 (10/16/20 0837)  Complications: No notable events documented.

## 2020-10-18 DIAGNOSIS — I63511 Cerebral infarction due to unspecified occlusion or stenosis of right middle cerebral artery: Secondary | ICD-10-CM | POA: Diagnosis not present

## 2020-10-18 LAB — CBC WITH DIFFERENTIAL/PLATELET
Abs Immature Granulocytes: 0.06 10*3/uL (ref 0.00–0.07)
Basophils Absolute: 0.1 10*3/uL (ref 0.0–0.1)
Basophils Relative: 0 %
Eosinophils Absolute: 0.2 10*3/uL (ref 0.0–0.5)
Eosinophils Relative: 1 %
HCT: 37.3 % (ref 36.0–46.0)
Hemoglobin: 12.7 g/dL (ref 12.0–15.0)
Immature Granulocytes: 1 %
Lymphocytes Relative: 20 %
Lymphs Abs: 2.5 10*3/uL (ref 0.7–4.0)
MCH: 29.6 pg (ref 26.0–34.0)
MCHC: 34 g/dL (ref 30.0–36.0)
MCV: 86.9 fL (ref 80.0–100.0)
Monocytes Absolute: 1.1 10*3/uL — ABNORMAL HIGH (ref 0.1–1.0)
Monocytes Relative: 9 %
Neutro Abs: 8.6 10*3/uL — ABNORMAL HIGH (ref 1.7–7.7)
Neutrophils Relative %: 69 %
Platelets: 314 10*3/uL (ref 150–400)
RBC: 4.29 MIL/uL (ref 3.87–5.11)
RDW: 13.5 % (ref 11.5–15.5)
WBC: 12.5 10*3/uL — ABNORMAL HIGH (ref 4.0–10.5)
nRBC: 0 % (ref 0.0–0.2)

## 2020-10-18 LAB — COMPREHENSIVE METABOLIC PANEL
ALT: 20 U/L (ref 0–44)
AST: 21 U/L (ref 15–41)
Albumin: 3.6 g/dL (ref 3.5–5.0)
Alkaline Phosphatase: 55 U/L (ref 38–126)
Anion gap: 16 — ABNORMAL HIGH (ref 5–15)
BUN: 15 mg/dL (ref 6–20)
CO2: 19 mmol/L — ABNORMAL LOW (ref 22–32)
Calcium: 9.6 mg/dL (ref 8.9–10.3)
Chloride: 103 mmol/L (ref 98–111)
Creatinine, Ser: 0.59 mg/dL (ref 0.44–1.00)
GFR, Estimated: >60 mL/min (ref >60)
Glucose, Bld: 106 mg/dL — ABNORMAL HIGH (ref 70–99)
Potassium: 3.5 mmol/L (ref 3.5–5.1)
Sodium: 138 mmol/L (ref 135–145)
Total Bilirubin: 1.9 mg/dL — ABNORMAL HIGH (ref 0.3–1.2)
Total Protein: 6.7 g/dL (ref 6.5–8.1)

## 2020-10-18 LAB — PROTHROMBIN GENE MUTATION

## 2020-10-18 LAB — HOMOCYSTEINE: Homocysteine: 8.5 umol/L (ref 0.0–14.5)

## 2020-10-18 NOTE — Discharge Instructions (Addendum)
Inpatient Rehab Discharge Instructions  Kristen Grant Discharge date and time: No discharge date for patient encounter.   Activities/Precautions/ Functional Status: Activity: activity as tolerated Diet: regular diet Wound Care: Routine skin checks Functional status:  ___ No restrictions     ___ Walk up steps independently ___ 24/7 supervision/assistance   ___ Walk up steps with assistance ___ Intermittent supervision/assistance  ___ Bathe/dress independently ___ Walk with walker     __x_ Bathe/dress with assistance ___ Walk Independently    ___ Shower independently ___ Walk with assistance    ___ Shower with assistance ___ No alcohol     ___ Return to work/school ________    COMMUNITY REFERRALS UPON DISCHARGE:   Outpatient: PT     OT                 Agency:[facility] Phone: [phone]              Appointment Date/Time:TBD  New Patient Visit with Rodman Pickle on Monday, December 16, 2020 at 8:20 AM with [facility]   Special Instructions:  No driving smoking or alcoholSTROKE/TIA DISCHARGE INSTRUCTIONS SMOKING Cigarette smoking nearly doubles your risk of having a stroke & is the single most alterable risk factor  If you smoke or have smoked in the last 12 months, you are advised to quit smoking for your health. Most of the excess cardiovascular risk related to smoking disappears within a year of stopping. Ask you doctor about anti-smoking medications [place] Quit Line: 1-800-QUIT NOW Free Smoking Cessation Classes [phone]  CHOLESTEROL Know your levels; limit fat & cholesterol in your diet  Lipid Panel     Component Value Date/Time   CHOL 171 10/12/2020 0018   TRIG 55 10/12/2020 0018   HDL 43 10/12/2020 0018   CHOLHDL 4.0 10/12/2020 0018   VLDL 11 10/12/2020 0018   LDLCALC 117 (H) 10/12/2020 0018     Many patients benefit from treatment even if their cholesterol is at goal. Goal: Total Cholesterol (CHOL) less than 160 Goal:  Triglycerides  (TRIG) less than 150 Goal:  HDL greater than 40 Goal:  LDL (LDLCALC) less than 100   BLOOD PRESSURE American Stroke Association blood pressure target is less that 120/80 mm/Hg  Your discharge blood pressure is:  BP: 118/68 Monitor your blood pressure Limit your salt and alcohol intake Many individuals will require more than one medication for high blood pressure  DIABETES (A1c is a blood sugar average for last 3 months) Goal HGBA1c is under 7% (HBGA1c is blood sugar average for last 3 months)  Diabetes: No known diagnosis of diabetes    Lab Results  Component Value Date   HGBA1C 5.0 10/12/2020    Your HGBA1c can be lowered with medications, healthy diet, and exercise. Check your blood sugar as directed by your physician Call your physician if you experience unexplained or low blood sugars.  PHYSICAL ACTIVITY/REHABILITATION Goal is 30 minutes at least 4 days per week  Activity: Increase activity slowly, Therapies: Physical Therapy: Home Health Return to work:  Activity decreases your risk of heart attack and stroke and makes your heart stronger.  It helps control your weight and blood pressure; helps you relax and can improve your mood. Participate in a regular exercise program. Talk with your doctor about the best form of exercise for you (dancing, walking, swimming, cycling).  DIET/WEIGHT Goal is to maintain a healthy weight  Your discharge diet is:  Diet Order  Diet regular Room service appropriate? Yes; Fluid consistency: Thin  Diet effective now                   liquids Your height is:  Height: 5\' 9"  (175.3 cm) Your current weight is: Weight: 88.1 kg Your Body Mass Index (BMI) is:  BMI (Calculated): 28.67 Following the type of diet specifically designed for you will help prevent another stroke. Your goal weight range is:   Your goal Body Mass Index (BMI) is 19-24. Healthy food habits can help reduce 3 risk factors for stroke:  High cholesterol, hypertension,  and excess weight.  RESOURCES Stroke/Support Group:  Call [phone]   STROKE EDUCATION PROVIDED/REVIEWED AND GIVEN TO PATIENT Stroke warning signs and symptoms How to activate emergency medical system (call 911). Medications prescribed at discharge. Need for follow-up after discharge. Personal risk factors for stroke. Pneumonia vaccine given: No Flu vaccine given: No My questions have been answered, the writing is legible, and I understand these instructions.  I will adhere to these goals & educational materials that have been provided to me after my discharge from the hospital.       My questions have been answered and I understand these instructions. I will adhere to these goals and the provided educational materials after my discharge from the hospital.  Patient/Caregiver Signature _______________________________ Date __________  Clinician Signature _______________________________________ Date __________  Please bring this form and your medication list with you to all your follow-up doctor's appointments.

## 2020-10-18 NOTE — Evaluation (Signed)
Physical Therapy Assessment and Plan  Patient Details  Name: Kristen Grant MRN: [id number] Date of Birth: August 24, 1995  PT Diagnosis: Abnormal posture, Abnormality of gait, Difficulty walking, Hemiparesis non-dominant, Hypotonia, and Muscle weakness Rehab Potential: Good ELOS: ~2 weeks   Today's Date: 10/18/2020 PT Individual Time: 1102-1200 PT Individual Time Calculation (min): 58 min    Hospital Problem: Active Problems:   Right middle cerebral artery stroke [facility])   Past Medical History:  Past Medical History:  Diagnosis Date   Anxiety    Asthma    no treatments required at this time   Migraine headache    Past Surgical History:  Past Surgical History:  Procedure Laterality Date   BUBBLE STUDY  10/17/2020   Procedure: BUBBLE STUDY;  Surgeon: Jerline Pain, MD;  Location: [facility] ENDOSCOPY;  Service: Cardiovascular;;   NO PAST SURGERIES     RADIOLOGY WITH ANESTHESIA N/A 10/12/2020   Procedure: RADIOLOGY WITH ANESTHESIA;  Surgeon: Radiologist, Medication, MD;  Location: [place];  Service: Radiology;  Laterality: N/A;   RADIOLOGY WITH ANESTHESIA N/A 10/11/2020   Procedure: IR WITH ANESTHESIA;  Surgeon: Radiologist, Medication, MD;  Location: [place];  Service: Radiology;  Laterality: N/A;   TEE WITHOUT CARDIOVERSION N/A 10/17/2020   Procedure: TRANSESOPHAGEAL ECHOCARDIOGRAM (TEE);  Surgeon: Jerline Pain, MD;  Location: [facility] ENDOSCOPY;  Service: Cardiovascular;  Laterality: N/A;    Assessment & Plan Clinical Impression: Patient is a 25 y.o. year old female with morbid obesity BMI 29.92, anxiety, migraine headaches, tobacco use.  Per chart review patient independent prior to admission working full-time.  She lives with her boyfriend and 31-year-old son.  Mother with good support.  Presented 10/11/2020 with acute onset of left-sided weakness, dysarthria and facial droop.  Cranial CT scan showed hypodensity in the right anterior temporal lobe concerning for infarct.  CT angiogram head and neck  thrombus in the distal right M1 segment with near complete occlusion.  11 cc infarct core in the right corona radiata.  Admission chemistries alcohol negative, WBC 11,300, glucose 101, urine drug screen positive marijuana as well as benzos.  Patient underwent mechanical thrombectomy 10/11/2020 with revascularization per interventional radiology requiring repeat thrombectomy 24 hours later for occluded dominant superior division of the right MCA achieving revascularization.  Latest MRI and imaging 10/13/2020 shows acute anterior right MCA territory infarct predominantly involving the right caudate putamen and anterior limb of the right internal capsule, insula, operculum and overlying right frontal lobe.  A few punctate acute infarcts in the right parietal lobe.  Associated cytotoxic edema with slight 2-3 mm leftward midline shift at the foramen of [place].  Probable small amount of hemorrhage in the posterior right frontal lobe/insular region.  Echocardiogram with ejection fraction of 60 to 65% no wall motion abnormalities.  Hyper coag work-up negative.  ANA and RPR negative.  TEE is pending.  Patient was cleared to begin aspirin for CVA prophylaxis.  Lower extremity Dopplers negative for DVT.  Cleviprex initiated for blood pressure control.  Tolerating a regular diet.  Therapy evaluations completed due to patient's left-sided weakness and dysarthria was admitted for a comprehensive rehab program.  Patient currently requires mod with mobility secondary to muscle weakness, decreased cardiorespiratoy endurance, abnormal tone and decreased coordination, and decreased sitting balance, decreased standing balance, decreased postural control, hemiplegia, and decreased balance strategies.  Prior to hospitalization, patient was independent  with mobility and lived with Son, Significant other in a House home.  Home access is 2Stairs to enter.  Patient will benefit  from skilled PT intervention to maximize safe functional  mobility and minimize fall risk for planned discharge home with 24 hour supervision.  Anticipate patient will benefit from follow up OP at discharge.  PT - End of Session Activity Tolerance: Tolerates 10 - 20 min activity with multiple rests Endurance Deficit: Yes PT Assessment Rehab Potential (ACUTE/IP ONLY): Good PT Patient demonstrates impairments in the following area(s): Balance;Behavior;Endurance;Motor;Nutrition;Perception;Safety;Sensory;Skin Integrity PT Transfers Functional Problem(s): Bed Mobility;Bed to Chair;Car;Furniture;Floor PT Locomotion Functional Problem(s): Ambulation;Stairs PT Plan PT Intensity: Minimum of 1-2 x/day ,45 to 90 minutes PT Frequency: 5 out of 7 days PT Duration Estimated Length of Stay: ~2 weeks PT Treatment/Interventions: Ambulation/gait training;Discharge planning;Functional mobility training;Psychosocial support;Therapeutic Activities;Visual/perceptual remediation/compensation;Balance/vestibular training;Disease management/prevention;Neuromuscular re-education;Skin care/wound management;Wheelchair propulsion/positioning;Therapeutic Exercise;Cognitive remediation/compensation;DME/adaptive equipment instruction;Pain management;Splinting/orthotics;UE/LE Strength taining/ROM;Community reintegration;Functional electrical stimulation;Patient/family education;Stair training;UE/LE Coordination activities PT Transfers Anticipated Outcome(s): modI PT Locomotion Anticipated Outcome(s): spv PT Recommendation Recommendations for Other Services: Neuropsych consult;Therapeutic Recreation consult Therapeutic Recreation Interventions: Stress management Follow Up Recommendations: Outpatient PT;24 hour supervision/assistance Patient destination: Home Equipment Recommended: To be determined   PT Evaluation Precautions/Restrictions Precautions Precautions: Fall Precaution Comments: left hemiparesis Restrictions Weight Bearing Restrictions: No General   Vital Signs   Pain Pain Assessment Pain Scale: 0-10 Pain Score: 0-No pain Pain Interference Pain Interference Pain Effect on Sleep: 0. Does not apply - I have not had any pain or hurting in the past 5 days Pain Interference with Therapy Activities: 1. Rarely or not at all Pain Interference with Day-to-Day Activities: 1. Rarely or not at all Home Living/Prior [place]: Spouse/significant other;Parent Available Help at Discharge: Family;Available 24 hours/day (mom is moving in to be avail 24/7) Type of Home: House Home Access: Stairs to enter [employer] of Steps: 2 Entrance Stairs-Rails: None Home Layout: One level Bathroom Shower/Tub: Chiropodist: Standard Bathroom Accessibility: Yes  Lives With: Son;Significant other Prior Function Level of Independence: Independent with basic ADLs;Independent with transfers;Independent with homemaking with ambulation;Independent with gait  Able to Take Stairs?: Yes Driving: Yes Vocation: Full time employment Comments: working full time at [employer]- starting at [employer] when she can. On her feet full time at work Vision/Perception  Perception Perception: Within [place]: Intact  Cognition Overall Cognitive Status: Within Functional Limits for tasks assessed Arousal/Alertness: Awake/alert Orientation Level: Oriented X4 Awareness: Appears intact Problem Solving: Appears intact Safety/Judgment: Appears intact Sensation Sensation Light Touch: Appears Intact Hot/Cold: Not tested Proprioception: Appears Intact Stereognosis: Appears Intact Coordination Gross Motor Movements are Fluid and Coordinated: No Fine Motor Movements are Fluid and Coordinated: No Coordination and Movement Description: L hemiparesis Heel Shin Test: decreased AROM L LE Motor  Motor Motor: Hemiplegia;Abnormal tone;Abnormal postural alignment and control Motor - Skilled  Clinical Observations: L hemiparesis   Trunk/Postural Assessment  Cervical Assessment Cervical Assessment: Within Functional Limits Thoracic Assessment Thoracic Assessment: Within Functional Limits Lumbar Assessment Lumbar Assessment: Exceptions to [facility] (posterior pelvic tilt) Postural Control Postural Control: Deficits on evaluation Righting Reactions: delayed and inadequate Protective Responses: delayed and inadequate  Balance Balance Balance Assessed: Yes Standardized Balance Assessment Standardized Balance Assessment: Berg Balance Test Berg Balance Test Sit to Stand: Needs minimal aid to stand or to stabilize Standing Unsupported: Unable to stand 30 seconds unassisted Sitting with Back Unsupported but Feet Supported on Floor or Stool: Able to sit 2 minutes under supervision Stand to Sit: Uses backs of legs against chair to control descent Transfers: Needs one person to assist Standing Unsupported with Eyes Closed: Needs help to  keep from falling Standing Ubsupported with Feet Together: Needs help to attain position and unable to hold for 15 seconds From Standing, Reach Forward with Outstretched Arm: Reaches forward but needs supervision From Standing Position, Pick up Object from Floor: Unable to try/needs assist to keep balance From Standing Position, Turn to Look Behind Over each Shoulder: Needs assist to keep from losing balance and falling Turn 360 Degrees: Needs assistance while turning Standing Unsupported, Alternately Place Feet on Step/Stool: Needs assistance to keep from falling or unable to try Standing Unsupported, One Foot in Front: Loses balance while stepping or standing Standing on One Leg: Unable to try or needs assist to prevent fall Total Score: 8 Static Sitting Balance Static Sitting - Balance Support: Feet supported Static Sitting - Level of Assistance: 5: Stand by assistance Dynamic Sitting Balance Dynamic Sitting - Balance Support: Feet supported;During  functional activity Dynamic Sitting - Level of Assistance: 4: Min assist Static Standing Balance Static Standing - Balance Support: During functional activity Static Standing - Level of Assistance: 4: Min assist Dynamic Standing Balance Dynamic Standing - Balance Support: During functional activity Dynamic Standing - Level of Assistance: 3: Mod assist Extremity Assessment      RLE Assessment RLE Assessment: Within Functional Limits LLE Assessment LLE Assessment: Exceptions to [facility] LLE Strength Left Hip Flexion: 2/5 Left Hip ABduction: 3/5 Left Hip ADduction: 3/5 Left Knee Flexion: 3-/5 Left Knee Extension: 3/5 Left Ankle Plantar Flexion: 3/5  Care Tool Care Tool Bed Mobility Roll left and right activity   Roll left and right assist level: Minimal Assistance - Patient > 75%    Sit to lying activity   Sit to lying assist level: Minimal Assistance - Patient > 75%    Lying to sitting on side of bed activity   Lying to sitting on side of bed assist level: the ability to move from lying on the back to sitting on the side of the bed with no back support.: Minimal Assistance - Patient > 75%     Care Tool Transfers Sit to stand transfer   Sit to stand assist level: Minimal Assistance - Patient > 75%    Chair/bed transfer   Chair/bed transfer assist level: Moderate Assistance - Patient 50 - 74%     Toilet transfer   Assist Level: Moderate Assistance - Patient 50 - 74%    Car transfer   Car transfer assist level: Moderate Assistance - Patient 50 - 74%      Care Tool Locomotion Ambulation   Assist level: Moderate Assistance - Patient 50 - 74% Assistive device: Hand held assist Max distance: 40  Walk 10 feet activity   Assist level: Moderate Assistance - Patient - 50 - 74% Assistive device: Hand held assist   Walk 50 feet with 2 turns activity Walk 50 feet with 2 turns activity did not occur: Safety/medical concerns      Walk 150 feet activity Walk 150 feet activity did  not occur: Safety/medical concerns      Walk 10 feet on uneven surfaces activity Walk 10 feet on uneven surfaces activity did not occur: Safety/medical concerns      Stairs   Assist level: Moderate Assistance - Patient - 50 - 74% Stairs assistive device: 1 hand rail Max number of stairs: 12  Walk up/down 1 step activity   Walk up/down 1 step (curb) assist level: Moderate Assistance - Patient - 50 - 74% Walk up/down 1 step or curb assistive device: 1 hand rail  Walk up/down 4 steps activity Walk up/down 4 steps assist level: Moderate Assistance - Patient - 50 - 74% Walk up/down 4 steps assistive device: 1 hand rail  Walk up/down 12 steps activity   Walk up/down 12 steps assist level: Moderate Assistance - Patient - 50 - 74% Walk up/down 12 steps assistive device: 1 hand rail  Pick up small objects from floor Pick up small object from the floor (from standing position) activity did not occur: Safety/medical concerns      Wheelchair Is the patient using a wheelchair?: No          Wheel 50 feet with 2 turns activity      Wheel 150 feet activity        Refer to Care Plan for Long Term Goals  SHORT TERM GOAL WEEK 1 PT Short Term Goal 1 (Week 1): Patient will ambulate at least 70f with MinA and LRAD PT Short Term Goal 2 (Week 1): Patient will transfer bed <> wc with LRAD and MinA consistently PT Short Term Goal 3 (Week 1): Patient will score >12/56 on the BBS  Recommendations for other services: Neuropsych and Therapeutic Recreation  Stress management  Skilled Therapeutic Intervention Mobility Bed Mobility Bed Mobility: Rolling Right;Rolling Left;Supine to Sit;Sit to Supine Rolling Right: Supervision/verbal cueing Rolling Left: Supervision/Verbal cueing Supine to Sit: Minimal Assistance - Patient > 75% Sit to Supine: Minimal Assistance - Patient > 75% Transfers Transfers: Sit to Stand;Stand to Sit;Stand Pivot Transfers Sit to Stand: Minimal Assistance - Patient >  75% Stand to Sit: Minimal Assistance - Patient > 75% Stand Pivot Transfers: Minimal Assistance - Patient > 75% Stand Pivot Transfer Details: Verbal cues for sequencing;Verbal cues for technique;Verbal cues for precautions/safety;Verbal cues for gait pattern Transfer (Assistive device): 1 person hand held assist Locomotion  Gait Ambulation: Yes Gait Assistance: Minimal Assistance - Patient > 75%;Moderate Assistance - Patient 50-74% Gait Distance (Feet): 40 Feet Assistive device: 1 person hand held assist Gait Assistance Details: Verbal cues for gait pattern;Verbal cues for precautions/safety Gait Gait: Yes Gait Pattern: Impaired Gait Pattern: Step-to pattern;Decreased step length - right;Decreased stance time - left;Decreased hip/knee flexion - left;Decreased weight shift to left;Poor foot clearance - left Gait velocity: Decreased Stairs / Additional Locomotion Stairs: Yes Stairs Assistance: Minimal Assistance - Patient > 75%;Moderate Assistance - Patient 50 - 74% Stair Management Technique: One rail Right Number of Stairs: 12 Height of Stairs: 6 Curb: Moderate Assistance - Patient 50 - 74% Wheelchair Mobility Wheelchair Mobility: No   Patient received supine in bed, agreeable to PT. She denies pain at the moment, but reports that she does experience pain in her R groin related to sx site. She requires grossly MinA/ModA for all functional mobility. She maintains a flat affect throughout eval.   Discharge Criteria: Patient will be discharged from PT if patient refuses treatment 3 consecutive times without medical reason, if treatment goals not met, if there is a change in medical status, if patient makes no progress towards goals or if patient is discharged from hospital.  The above assessment, treatment plan, treatment alternatives and goals were discussed and mutually agreed upon: by patient  JDebbora Dus9/16/2022, 12:13 PM

## 2020-10-18 NOTE — Plan of Care (Signed)
  Problem: RH Balance Goal: LTG Patient will maintain dynamic sitting balance (PT) Description: LTG:  Patient will maintain dynamic sitting balance with assistance during mobility activities (PT) Flowsheets (Taken 10/18/2020 1217) LTG: Pt will maintain dynamic sitting balance during mobility activities with:: Independent Goal: LTG Patient will maintain dynamic standing balance (PT) Description: LTG:  Patient will maintain dynamic standing balance with assistance during mobility activities (PT) Flowsheets (Taken 10/18/2020 1217) LTG: Pt will maintain dynamic standing balance during mobility activities with:: Supervision/Verbal cueing   Problem: Sit to Stand Goal: LTG:  Patient will perform sit to stand with assistance level (PT) Description: LTG:  Patient will perform sit to stand with assistance level (PT) Flowsheets (Taken 10/18/2020 1217) LTG: PT will perform sit to stand in preparation for functional mobility with assistance level: Supervision/Verbal cueing   Problem: RH Bed Mobility Goal: LTG Patient will perform bed mobility with assist (PT) Description: LTG: Patient will perform bed mobility with assistance, with/without cues (PT). Flowsheets (Taken 10/18/2020 1217) LTG: Pt will perform bed mobility with assistance level of: Independent   Problem: RH Bed to Chair Transfers Goal: LTG Patient will perform bed/chair transfers w/assist (PT) Description: LTG: Patient will perform bed to chair transfers with assistance (PT). Flowsheets (Taken 10/18/2020 1217) LTG: Pt will perform Bed to Chair Transfers with assistance level: Independent with assistive device    Problem: RH Car Transfers Goal: LTG Patient will perform car transfers with assist (PT) Description: LTG: Patient will perform car transfers with assistance (PT). Flowsheets (Taken 10/18/2020 1217) LTG: Pt will perform car transfers with assist:: Contact Guard/Touching assist   Problem: RH Ambulation Goal: LTG Patient will  ambulate in controlled environment (PT) Description: LTG: Patient will ambulate in a controlled environment, # of feet with assistance (PT). Flowsheets (Taken 10/18/2020 1217) LTG: Pt will ambulate in controlled environ  assist needed:: Supervision/Verbal cueing LTG: Ambulation distance in controlled environment: 50 Goal: LTG Patient will ambulate in home environment (PT) Description: LTG: Patient will ambulate in home environment, # of feet with assistance (PT). Flowsheets (Taken 10/18/2020 1217) LTG: Pt will ambulate in home environ  assist needed:: Supervision/Verbal cueing LTG: Ambulation distance in home environment: 50 Goal: LTG Patient will ambulate in community environment (PT) Description: LTG: Patient will ambulate in community environment, # of feet with assistance (PT). Flowsheets (Taken 10/18/2020 1217) LTG: Pt will ambulate in community environ  assist needed:: Supervision/Verbal cueing LTG: Ambulation distance in community environment: 300   Problem: RH Stairs Goal: LTG Patient will ambulate up and down stairs w/assist (PT) Description: LTG: Patient will ambulate up and down # of stairs with assistance (PT) Flowsheets (Taken 10/18/2020 1217) LTG: Pt will ambulate up/down stairs assist needed:: Supervision/Verbal cueing LTG: Pt will  ambulate up and down number of stairs: 2 Note: No HR per home set up

## 2020-10-18 NOTE — Progress Notes (Signed)
Inpatient Rehabilitation  Patient information reviewed and entered into eRehab system by Carmeron Heady M. Byran Bilotti, M.A., CCC/SLP, PPS Coordinator.  Information including medical coding, functional ability and quality indicators will be reviewed and updated through discharge.    

## 2020-10-18 NOTE — Progress Notes (Signed)
Inpatient Rehabilitation Care Coordinator Assessment and Plan Patient Details  Name: Kristen Grant MRN: [id number] Date of Birth: [birth year]  Today's Date: 10/18/2020  Hospital Problems: Active Problems:   Right middle cerebral artery stroke [facility])  Past Medical History:  Past Medical History:  Diagnosis Date   Anxiety    Asthma    no treatments required at this time   Migraine headache    Past Surgical History:  Past Surgical History:  Procedure Laterality Date   BUBBLE STUDY  10/17/2020   Procedure: BUBBLE STUDY;  Surgeon: Jerline Pain, MD;  Location: [facility] ENDOSCOPY;  Service: Cardiovascular;;   NO PAST SURGERIES     RADIOLOGY WITH ANESTHESIA N/A 10/12/2020   Procedure: RADIOLOGY WITH ANESTHESIA;  Surgeon: Radiologist, Medication, MD;  Location: [place];  Service: Radiology;  Laterality: N/A;   RADIOLOGY WITH ANESTHESIA N/A 10/11/2020   Procedure: IR WITH ANESTHESIA;  Surgeon: Radiologist, Medication, MD;  Location: [place];  Service: Radiology;  Laterality: N/A;   TEE WITHOUT CARDIOVERSION N/A 10/17/2020   Procedure: TRANSESOPHAGEAL ECHOCARDIOGRAM (TEE);  Surgeon: Jerline Pain, MD;  Location: [facility] ENDOSCOPY;  Service: Cardiovascular;  Laterality: N/A;   Social History:  reports that she has been smoking e-cigarettes. She started smoking about 20 months ago. She has never used smokeless tobacco. She reports that she does not currently use alcohol. She reports current drug use. Frequency: 3.00 times per week. Drug: Marijuana.  Family / Support Systems Marital Status: Single Patient Roles: Partner, Parent Spouse/Significant Other: [name] Children: 26 y.o son Other Supports: Marlane Hatcher (Mother) Anticipated Caregiver: Katharine Look and Fannie Knee Ability/Limitations of Caregiver: none Caregiver Availability: 24/7 Family Dynamics: support from family  Social History Preferred language: English Religion: None Education: [employer] - How often do you need to have someone help  you when you read instructions, pamphlets, or other written material from your doctor or pharmacy?: Never Writes: Yes Employment Status: Employed 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[employer] of Employment: 8 Return to Work Plans: Surveyor, minerals Issues: n/a Guardian/Conservator: n/a   Abuse/Neglect Abuse/Neglect Assessment Can Be Completed: Yes Physical Abuse: Denies Verbal Abuse: Denies Sexual Abuse: Denies Exploitation of patient/patient's resources: Denies Self-Neglect: Denies  Patient response to: Social Isolation - How often do you feel lonely or isolated from those around you?: Never  Emotional Status Pt's affect, behavior and adjustment status: yes Recent Psychosocial Issues: anxiety Psychiatric History: n/a Substance Abuse History: Tobacco use  Patient / Family Perceptions, Expectations & Goals Pt/Family understanding of illness & functional limitations: yes Premorbid pt/family roles/activities: Pt previously independent living with boyfriend and son Anticipated changes in roles/activities/participation: mother and boyfriend able to assist with roles and task Pt/family expectations/goals: MOD I to [place]: None Premorbid Home Care/DME Agencies: Other (Comment) Media planner) Transportation available at discharge: family able to transport Is the patient able to respond to transportation needs?: Yes In the past 12 months, has lack of transportation kept you from medical appointments or from getting medications?: No In the past 12 months, has lack of transportation kept you from meetings, work, or from getting things needed for daily living?: No Resource referrals recommended: Neuropsychology  Discharge Planning Living Arrangements: Spouse/significant other, Parent Support Systems: Spouse/significant other, Parent Type of Residence: Private residence (1 level home 2-3 steps) Insurance Resources: English as a second language teacher (specify) Passenger transport manager) Financial Resources: Employment Museum/gallery curator Screen Referred: No Living Expenses: Education officer, community Management: Patient, Significant Other Does the patient have any problems obtaining your medications?:  No Home Management: Independent Patient/Family Preliminary Plans: mother and boyfriend able to assist if needed Care Coordinator Barriers to Discharge: Insurance for SNF coverage Care Coordinator Barriers to Discharge Comments: [place] barrier due to insurance Care Coordinator Anticipated Follow Up Needs: HH/OP Expected length of stay: 10-14 Days  Clinical Impression Sw met with pt, introduced self, explained role and addressed questions and concerns. PT plans to discharge back home, boyfriend and mother will assist the pt inside the home at d/c. PT has FMLA paperwork, SW information provided to pt. No additional questions or concerns, sw will continue to follow up   Dyanne Iha 10/18/2020, 12:29 PM

## 2020-10-18 NOTE — Evaluation (Signed)
Speech Language Pathology Assessment and Plan  Patient Details  Name: Kristen Grant MRN: [id number] Date of Birth: Jan 13, 1996  SLP Diagnosis: Cognitive Impairments;Dysphagia  Rehab Potential: Excellent ELOS: 2 weeks    Today's Date: 10/18/2020 SLP Individual Time: 1093-2355 SLP Individual Time Calculation (min): 11 min   Hospital Problem: Active Problems:   Right middle cerebral artery stroke [facility])  Past Medical History:  Past Medical History:  Diagnosis Date   Anxiety    Asthma    no treatments required at this time   Migraine headache    Past Surgical History:  Past Surgical History:  Procedure Laterality Date   BUBBLE STUDY  10/17/2020   Procedure: BUBBLE STUDY;  Surgeon: Jerline Pain, MD;  Location: [facility] ENDOSCOPY;  Service: Cardiovascular;;   NO PAST SURGERIES     RADIOLOGY WITH ANESTHESIA N/A 10/12/2020   Procedure: RADIOLOGY WITH ANESTHESIA;  Surgeon: Radiologist, Medication, MD;  Location: [place];  Service: Radiology;  Laterality: N/A;   RADIOLOGY WITH ANESTHESIA N/A 10/11/2020   Procedure: IR WITH ANESTHESIA;  Surgeon: Radiologist, Medication, MD;  Location: [place];  Service: Radiology;  Laterality: N/A;   TEE WITHOUT CARDIOVERSION N/A 10/17/2020   Procedure: TRANSESOPHAGEAL ECHOCARDIOGRAM (TEE);  Surgeon: Jerline Pain, MD;  Location: [facility] ENDOSCOPY;  Service: Cardiovascular;  Laterality: N/A;    Assessment / Plan / Recommendation Clinical Impression Patient is a 25 year old right-handed female with morbid obesity, anxiety, migraine headaches, tobacco use.  Per chart review patient independent prior to admission working full-time.  She lives with her boyfriend and 30-year-old son.  Presented 10/11/2020 with acute onset of left-sided weakness, dysarthria and facial droop.  Cranial CT scan showed hypodensity in the right anterior temporal lobe concerning for infarct.  CT angiogram head and neck thrombus in the distal right M1 segment with near complete occlusion.  11 cc infarct  core in the right corona radiata.  Admission chemistries alcohol negative, WBC 11,300, glucose 101, urine drug screen positive marijuana as well as benzos.  Patient underwent mechanical thrombectomy 10/11/2020 with revascularization per interventional radiology requiring repeat thrombectomy 24 hours later for occluded dominant superior division of the right MCA achieving revascularization.  Latest MRI and imaging 10/13/2020 shows acute anterior right MCA territory infarct predominantly involving the right caudate putamen and anterior limb of the right internal capsule, insula, operculum and overlying right frontal lobe.  A few punctate acute infarcts in the right parietal lobe.  Associated cytotoxic edema with slight 2-3 mm leftward midline shift.  Probable small amount of hemorrhage in the posterior right frontal lobe/insular region.  Tolerating a regular diet.  Therapy evaluations completed with recommendations for a comprehensive rehab program. Patient admitted 10/17/20.  SLP administered the [facility] Mental Status Examination (SLUMS) and patient scored  25/30 points with a score of 27 or above considered normal. Patient demonstrates mild cognitive impairments characterized by impaired complex problem solving, impaired higher-level attention and impaired emergent awareness. Patient has a flat affect and her verbal expression is monotone with impaired prosody. Patient with a mild left oral-motor weakness but was 100% intelligible at the conversation level. Patient's overall weakness results in mildly prolonged mastication with solid textures and mild left anterior spillage. Patient consumed large, sequential sips of thin liquids without overt s/s of aspiration and demonstrated what appeared to be a timely swallow initiation. However, one overt cough was observed with mixed consistencies. Prior to PO intake, patient required cues for appropriate postioning. Recommend patient continue current diet of  regular textures with thin  liquids with ongoing education reinforcing use of an upright position and swallowing compensatory strategies. Patient would benefit from continued skilled SLP intervention to maximize her cognitive and swallowing functioning and overall functional independence prior to discharge.     Skilled Therapeutic Interventions          Administered a cognitive-linguistic evaluation and BSE, please see above for details.   SLP Assessment  Patient will need skilled Speech Lanaguage Pathology Services during CIR admission    Recommendations  SLP Diet Recommendations: Age appropriate regular solids;Thin Liquid Administration via: Cup;Straw Medication Administration: Whole meds with puree Supervision: Patient able to self feed Compensations: Slow rate;Small sips/bites;Minimize environmental distractions;Monitor for anterior loss Postural Changes and/or Swallow Maneuvers: Seated upright 90 degrees;Upright 30-60 min after meal Oral Care Recommendations: Oral care BID Recommendations for Other Services: Neuropsych consult Patient destination: Home Follow up Recommendations: 24 hour supervision/assistance;Outpatient SLP Equipment Recommended: None recommended by SLP    SLP Frequency 3 to 5 out of 7 days   SLP Duration  SLP Intensity  SLP Treatment/Interventions 2 weeks  Minumum of 1-2 x/day, 30 to 90 minutes  Cognitive remediation/compensation;Internal/external aids;Cueing hierarchy;Environmental controls;Functional tasks;Patient/family education;Therapeutic Activities;Dysphagia/aspiration precaution training    Pain No/Denies Pain   SLP Evaluation Cognition Overall Cognitive Status: Impaired/Different from baseline Arousal/Alertness: Awake/alert Orientation Level: Oriented X4 Attention: Alternating Alternating Attention: Impaired Alternating Attention Impairment: Functional complex Memory: Appears intact Awareness: Impaired Awareness Impairment: Emergent  impairment Problem Solving: Impaired Problem Solving Impairment: Functional complex Safety/Judgment: Appears intact  Comprehension Auditory Comprehension Overall Auditory Comprehension: Appears within functional limits for tasks assessed Visual Recognition/Discrimination Discrimination: Not tested Reading Comprehension Reading Status: Not tested Expression Expression Primary Mode of Expression: Verbal Verbal Expression Overall Verbal Expression: Impaired Initiation: No impairment Automatic Speech: Name;Social Response Repetition: No impairment Naming: No impairment Pragmatics: Impairment Impairments: Abnormal affect;Eye contact;Monotone Written Expression Dominant Hand: Right Written Expression: Not tested Oral Motor Oral Motor/Sensory Function Overall Oral Motor/Sensory Function: Mild impairment Facial ROM: Reduced left;Suspected CN VII (facial) dysfunction Facial Symmetry: Abnormal symmetry left;Suspected CN VII (facial) dysfunction Facial Strength: Reduced left;Suspected CN VII (facial) dysfunction Facial Sensation: Reduced left Lingual ROM: Reduced left Lingual Symmetry: Within Functional Limits Lingual Strength: Reduced;Suspected CN XII (hypoglossal) dysfunction Lingual Sensation: Within Functional Limits Velum: Within Functional Limits Mandible: Within Functional Limits Motor Speech Overall Motor Speech: Appears within functional limits for tasks assessed  Care Tool Care Tool Cognition Ability to hear (with hearing aid or hearing appliances if normally used Ability to hear (with hearing aid or hearing appliances if normally used): 0. Adequate - no difficulty in normal conservation, social interaction, listening to TV   Expression of Ideas and Wants Expression of Ideas and Wants: 3. Some difficulty - exhibits some difficulty with expressing needs and ideas (e.g, some words or finishing thoughts) or speech is not clear   Understanding Verbal and Non-Verbal Content  Understanding Verbal and Non-Verbal Content: 3. Usually understands - understands most conversations, but misses some part/intent of message. Requires cues at times to understand  Memory/Recall Ability Memory/Recall Ability : Current season;Location of own room;Staff names and faces;That he or she is in a hospital/hospital unit    Bedside Swallowing Assessment General Date of Onset: 10/12/20 Previous Swallow Assessment: BSE on 9/11: Recommended regular textures with thin liquids Diet Prior to this Study: Regular;Thin liquids Temperature Spikes Noted: No Respiratory Status: Room air History of Recent Intubation: Yes Length of Intubations (days):  (for procedure) Date extubated: 10/13/20 Behavior/Cognition: Alert;Pleasant mood;Cooperative Oral Cavity - Dentition: Adequate natural dentition Self-Feeding Abilities: Able  to feed self Vision: Functional for self-feeding Patient Positioning: Upright in bed Baseline Vocal Quality: Normal Volitional Cough: Strong Volitional Swallow: Able to elicit   Ice Chips Ice chips: Within functional limits Presentation: Spoon Thin Liquid Thin Liquid: Impaired Presentation: Straw;Cup Pharyngeal  Phase Impairments: Cough - Immediate Other Comments: with mixed consistencies Nectar Thick Nectar Thick Liquid: Not tested Honey Thick Honey Thick Liquid: Not tested Puree Puree: Within functional limits Presentation: Spoon;Self Fed Solid Solid: Impaired Presentation: Self Fed Oral Phase Impairments: Reduced labial seal Oral Phase Functional Implications: Oral residue;Left anterior spillage Pharyngeal Phase Impairments: Throat Clearing - Delayed Other Comments: intermittent throat clearing, patient reports due to TEE yesterday, atinet with intermittent throat clearing without trials BSE Assessment Risk for Aspiration Other Related Risk Factors: Deconditioning  Short Term Goals: Week 1: SLP Short Term Goal 1 (Week 1): Patient will complete complex  problem solving tasks with supervision level verbal cues. SLP Short Term Goal 2 (Week 1): Patient will demonstrate alternating attention between 2 tasks for 30 minutes with supervision level verbal cues for redirection. SLP Short Term Goal 3 (Week 1): Patient will self-monitor and correct errors during complex tasks with supervision level verbal cues. SLP Short Term Goal 4 (Week 1): Patient will consume current diet with minimal overt s/s of aspiration with Mod I for use of swallowing compensatory strategies.  Refer to Care Plan for Long Term Goals  Recommendations for other services: Neuropsych  Discharge Criteria: Patient will be discharged from SLP if patient refuses treatment 3 consecutive times without medical reason, if treatment goals not met, if there is a change in medical status, if patient makes no progress towards goals or if patient is discharged from hospital.  The above assessment, treatment plan, treatment alternatives and goals were discussed and mutually agreed upon: by patient  Ahyan Kreeger 10/18/2020, 3:47 PM

## 2020-10-18 NOTE — Progress Notes (Signed)
Patient slept well throughout the night. No c/o pain or discomfort.

## 2020-10-18 NOTE — IPOC Note (Addendum)
Overall Plan of Care [facility]) Patient Details Name: Kristen Grant MRN: [id number] DOB: February 28, 1995  Admitting Diagnosis: <principal problem not specified>  Hospital Problems: Active Problems:   Right middle cerebral artery stroke (HCC)   Hemiparesis affecting left side as late effect of stroke (HCC)   Leukocytosis   Dyslipidemia   Spastic hemiparesis (HCC)     Functional Problem List: Nursing Pain, Endurance, Medication Management, Safety, Bowel  PT Balance, Behavior, Endurance, Motor, Nutrition, Perception, Safety, Sensory, Skin Integrity  OT Balance, Cognition, Endurance, Motor, Safety  SLP Cognition, Nutrition  TR         Basic ADL's: OT Eating, Grooming, Bathing, Dressing, Toileting     Advanced  ADL's: OT Simple Meal Preparation     Transfers: PT Bed Mobility, Bed to Chair, Car, Furniture, Floor  OT Toilet, Tub/Shower     Locomotion: PT Ambulation, Stairs     Additional Impairments: OT Fuctional Use of Upper Extremity  SLP Swallowing, Communication, Social Cognition expression Problem Solving, Attention, Awareness  TR      Anticipated Outcomes Item Anticipated Outcome  Self Feeding modified independent  Swallowing  Mod I   Basic self-care  supervision to modified independent  Toileting  modified independent   Bathroom Transfers supervison to modified independent  Bowel/Bladder  manage bowel w mod I assist  Transfers  modI  Locomotion  spv  Communication     Cognition  Mod I-Supervision  Pain  at or below level 4  Safety/Judgment  maintain w cues/reminders   Therapy Plan: PT Intensity: Minimum of 1-2 x/day ,45 to 90 minutes PT Frequency: 5 out of 7 days PT Duration Estimated Length of Stay: ~2 weeks OT Intensity: Minimum of 1-2 x/day, 45 to 90 minutes OT Frequency: 5 out of 7 days OT Duration/Estimated Length of Stay: 14-16 days SLP Intensity: Minumum of 1-2 x/day, 30 to 90 minutes SLP Frequency: 3 to 5 out of 7 days SLP  Duration/Estimated Length of Stay: 2 weeks   Due to the current state of emergency, patients may not be receiving their 3-hours of Medicare-mandated therapy.   Team Interventions: Nursing Interventions Disease Management/Prevention, Medication Management, Discharge Planning, Pain Management, Bowel Management, Patient/Family Education  PT interventions Ambulation/gait training, Discharge planning, Functional mobility training, Psychosocial support, Therapeutic Activities, Visual/perceptual remediation/compensation, Balance/vestibular training, Disease management/prevention, Neuromuscular re-education, Skin care/wound management, Wheelchair propulsion/positioning, Therapeutic Exercise, Cognitive remediation/compensation, DME/adaptive equipment instruction, Pain management, Splinting/orthotics, UE/LE Strength taining/ROM, Community reintegration, Development worker, international aid stimulation, Patient/family education, Museum/gallery curator, UE/LE Coordination activities  OT Interventions Warden/ranger, Discharge planning, Self Care/advanced ADL retraining, Therapeutic Activities, UE/LE Coordination activities, Cognitive remediation/compensation, Functional mobility training, Patient/family education, Therapeutic Exercise, DME/adaptive equipment instruction, UE/LE Strength taining/ROM, Neuromuscular re-education  SLP Interventions Cognitive remediation/compensation, Internal/external aids, Cueing hierarchy, Environmental controls, Functional tasks, Patient/family education, Therapeutic Activities, Dysphagia/aspiration precaution training  TR Interventions    SW/CM Interventions Discharge Planning, Psychosocial Support, Patient/Family Education, Disease Management/Prevention   Barriers to Discharge MD  Medical stability  Nursing Decreased caregiver support, Home environment access/layout 1 level 2 ste w boyfriend/son  PT      OT      SLP  (N/A)    SW Insurance for SNF coverage HH barrier due to  insurance   Team Discharge Planning: Destination: PT-Home ,OT- Home , SLP-Home Projected Follow-up: PT-Outpatient PT, 24 hour supervision/assistance, OT-  24 hour supervision/assistance, SLP-24 hour supervision/assistance, Outpatient SLP Projected Equipment Needs: PT-To be determined, OT- To be determined, SLP-None recommended by SLP Equipment Details: PT- , OT-  Patient/family involved in  discharge planning: PT- Patient,  OT-Patient, SLP-Patient  MD ELOS: 10-14d Medical Rehab Prognosis:  Good Assessment: 25 year old right-handed female with morbid obesity BMI 29.92, anxiety, migraine headaches, tobacco use.  Per chart review patient independent prior to admission working full-time.  She lives with her boyfriend and 14-year-old son.  Mother with good support.  Presented 10/11/2020 with acute onset of left-sided weakness, dysarthria and facial droop.  Cranial CT scan showed hypodensity in the right anterior temporal lobe concerning for infarct.  CT angiogram head and neck thrombus in the distal right M1 segment with near complete occlusion.  11 cc infarct core in the right corona radiata.  Admission chemistries alcohol negative, WBC 11,300, glucose 101, urine drug screen positive marijuana as well as benzos.  Patient underwent mechanical thrombectomy 10/11/2020 with revascularization per interventional radiology requiring repeat thrombectomy 24 hours later for occluded dominant superior division of the right MCA achieving revascularization.  Latest MRI and imaging 10/13/2020 shows acute anterior right MCA territory infarct predominantly involving the right caudate putamen and anterior limb of the right internal capsule, insula, operculum and overlying right frontal lobe.  A few punctate acute infarcts in the right parietal lobe.  Associated cytotoxic edema with slight 2-3 mm leftward midline shift at the foramen of [place].  Probable small amount of hemorrhage in the posterior right frontal lobe/insular region.   Echocardiogram with ejection fraction of 60 to 65% no wall motion abnormalities.  Hyper coag work-up negative.  ANA and RPR negative.  TEE is pending.  Patient was cleared to begin aspirin for CVA prophylaxis.  Lower extremity Dopplers negative for DVT.  Cleviprex initiated for blood pressure control.  Tolerating a regular diet.     See Team Conference Notes for weekly updates to the plan of care

## 2020-10-18 NOTE — Progress Notes (Signed)
[facility] Individual Statement of Services  Patient Name:  Kristen Grant  Date:  10/18/2020  Welcome to the [facility]patient [facility].  Our goal is to provide you with an individualized program based on your diagnosis and situation, designed to meet your specific needs.  With this comprehensive rehabilitation program, you will be expected to participate in at least 3 hours of rehabilitation therapies Monday-Friday, with modified therapy programming on the weekends.  Your rehabilitation program will include the following services:  Physical Therapy (PT), Occupational Therapy (OT), Speech Therapy (ST), 24 hour per day rehabilitation nursing, Therapeutic Recreaction (TR), Neuropsychology, Care Coordinator, Rehabilitation Medicine, Nutrition Services, Pharmacy Services, and Other  Weekly team conferences will be held on Wednesdays to discuss your progress.  Your Inpatient Rehabilitation Care Coordinator will talk with you frequently to get your input and to update you on team discussions.  Team conferences with you and your family in attendance may also be held.  Expected length of stay:  10-14 Days  Overall anticipated outcome:  MOD I to Supervision  Depending on your progress and recovery, your program may change. Your Inpatient Rehabilitation Care Coordinator will coordinate services and will keep you informed of any changes. Your Inpatient Rehabilitation Care Coordinator's name and contact numbers are listed  below.  The following services may also be recommended but are not provided by the Inpatient Rehabilitation Center:   Home Health Rehabiltiation Services Outpatient Rehabilitation Services    Arrangements will be made to provide these services after discharge if needed.  Arrangements include referral to agencies that provide these services.  Your insurance has been verified to be:  [employer]  Your primary doctor is:  NO PCP  Pertinent information will  be shared with your doctor and your insurance company.  Inpatient Rehabilitation Care Coordinator:  Lavera Guise, [place] [phone] or [phone]  Information discussed with and copy given to patient by: Andria Rhein, 10/18/2020, 11:40 AM

## 2020-10-18 NOTE — Progress Notes (Signed)
Patient ID: Kristen Grant, female   DOB: 1995/11/12, 25 y.o.   MRN: [id number] Met with the patient to introduce self and the role of the nurse CM. Reviewed rehab routine, medications and plan of care. Discussed etiology of stroke and pending labs. Patient reported she is aware of the need to stop smoking and use alternative means for birth control. Also discussed dietary modifications with handouts given and information on exercise for the heart. Patient without a PCP; reviewed options available to her. Patient asked about FMLA paperwork completion, to have mother send in papers. Continue to follow along to discharge to address educational needs and collaborate with the team to facilitate prep for discharge. Margarito Liner, RN

## 2020-10-18 NOTE — Evaluation (Signed)
Occupational Therapy Assessment and Plan  Patient Details  Name: Kristen Grant MRN: [id number] Date of Birth: Jun 07, 1995  OT Diagnosis: acute pain, cognitive deficits, hemiplegia affecting non-dominant side, and muscle weakness (generalized) Rehab Potential: Rehab Potential (ACUTE ONLY): Excellent ELOS: 14-16 days   Today's Date: 10/18/2020 OT Individual Time: 8144-8185 OT Individual Time Calculation (min): 75 min     Hospital Problem: Active Problems:   Right middle cerebral artery stroke [facility])   Past Medical History:  Past Medical History:  Diagnosis Date   Anxiety    Asthma    no treatments required at this time   Migraine headache    Past Surgical History:  Past Surgical History:  Procedure Laterality Date   BUBBLE STUDY  10/17/2020   Procedure: BUBBLE STUDY;  Surgeon: Jerline Pain, MD;  Location: [facility] ENDOSCOPY;  Service: Cardiovascular;;   NO PAST SURGERIES     RADIOLOGY WITH ANESTHESIA N/A 10/12/2020   Procedure: RADIOLOGY WITH ANESTHESIA;  Surgeon: Radiologist, Medication, MD;  Location: [place];  Service: Radiology;  Laterality: N/A;   RADIOLOGY WITH ANESTHESIA N/A 10/11/2020   Procedure: IR WITH ANESTHESIA;  Surgeon: Radiologist, Medication, MD;  Location: [place];  Service: Radiology;  Laterality: N/A;   TEE WITHOUT CARDIOVERSION N/A 10/17/2020   Procedure: TRANSESOPHAGEAL ECHOCARDIOGRAM (TEE);  Surgeon: Jerline Pain, MD;  Location: [facility] ENDOSCOPY;  Service: Cardiovascular;  Laterality: N/A;    Assessment & Plan Clinical Impression: Patient is a 25 y.o. year old female with recent admission to the hospital on 10/11/2020 with acute onset of left-sided weakness, dysarthria and facial droop.  Cranial CT scan showed hypodensity in the right anterior temporal lobe concerning for infarct.  CT angiogram head and neck thrombus in the distal right M1 segment with near complete occlusion.  11 cc infarct core in the right corona radiata.  Admission chemistries alcohol negative, WBC  11,300, glucose 101, urine drug screen positive marijuana as well as benzos.  Patient underwent mechanical thrombectomy 10/11/2020 with revascularization per interventional radiology requiring repeat thrombectomy 24 hours later for occluded dominant superior division of the right MCA achieving revascularization.  Latest MRI and imaging 10/13/2020 shows acute anterior right MCA territory infarct predominantly involving the right caudate putamen and anterior limb of the right internal capsule, insula, operculum and overlying right frontal lobe.  A few punctate acute infarcts in the right parietal lobe.  Patient transferred to CIR on 10/17/2020 .    Patient currently requires mod with basic self-care skills secondary to muscle weakness and muscle paralysis, impaired timing and sequencing, unbalanced muscle activation, and decreased coordination, decreased awareness, decreased problem solving, and decreased safety awareness, and decreased standing balance, decreased postural control, hemiplegia, and decreased balance strategies.  Prior to hospitalization, patient could complete ADLs with independent .  Patient will benefit from skilled intervention to decrease level of assist with basic self-care skills, increase independence with basic self-care skills, and increase level of independence with iADL prior to discharge home with care partner.  Anticipate patient will require 24 hour supervision and follow up outpatient.  OT - End of Session Activity Tolerance: Improving Endurance Deficit: Yes OT Assessment Rehab Potential (ACUTE ONLY): Excellent OT Patient demonstrates impairments in the following area(s): Balance;Cognition;Endurance;Motor;Safety OT Basic ADL's Functional Problem(s): Eating;Grooming;Bathing;Dressing;Toileting OT Advanced ADL's Functional Problem(s): Simple Meal Preparation OT Transfers Functional Problem(s): Toilet;Tub/Shower OT Additional Impairment(s): Fuctional Use of Upper Extremity OT  Plan OT Intensity: Minimum of 1-2 x/day, 45 to 90 minutes OT Frequency: 5 out of 7 days OT Duration/Estimated Length  of Stay: 14-16 days OT Treatment/Interventions: Balance/vestibular training;Discharge planning;Self Care/advanced ADL retraining;Therapeutic Activities;UE/LE Coordination activities;Cognitive remediation/compensation;Functional mobility training;Patient/family education;Therapeutic Exercise;DME/adaptive equipment instruction;UE/LE Strength taining/ROM;Neuromuscular re-education OT Self Feeding Anticipated Outcome(s): modified independent OT Basic Self-Care Anticipated Outcome(s): supervision to modified independent OT Toileting Anticipated Outcome(s): modified independent OT Bathroom Transfers Anticipated Outcome(s): supervison to modified independent OT Recommendation Recommendations for Other Services: Therapeutic Recreation consult Therapeutic Recreation Interventions: Stress management Patient destination: Home Follow Up Recommendations: 24 hour supervision/assistance Equipment Recommended: To be determined   OT Evaluation Precautions/Restrictions  Precautions Precautions: Fall Precaution Comments: left hemiparesis Restrictions Weight Bearing Restrictions: No   Pain  No report of pain  Home Living/Prior Functioning Home Living Family/patient expects to be discharged to:: Private residence Living Arrangements: Spouse/significant other, Parent Available Help at Discharge: Family, Available 24 hours/day Type of Home: House Home Access: Stairs to enter Technical brewer of Steps: 2 Entrance Stairs-Rails: None Home Layout: One level Bathroom Shower/Tub: Chiropodist: Standard Bathroom Accessibility: Yes  Lives With: Son, Significant other IADL History Homemaking Responsibilities: Yes Meal Prep Responsibility: Primary Laundry Responsibility: Primary Cleaning Responsibility: Primary Bill Paying/Finance Responsibility: Primary Child  Care Responsibility: Primary Current License: Yes Occupation: Full time employment Prior Function Level of Independence: Independent with basic ADLs, Independent with transfers, Independent with homemaking with ambulation, Independent with gait  Able to Take Stairs?: Yes Driving: Yes Vocation: Full time employment Comments: working full time at [employer]- starting at [employer] when she can. On her feet full time at work Vision Baseline Vision/History: 0 No visual deficits Ability to See in Adequate Light: 0 Adequate Patient Visual Report: No change from baseline Vision Assessment?: No apparent visual deficits;Yes Ocular Range of Motion: Within Functional Limits Alignment/Gaze Preference: Within Defined Limits Tracking/Visual Pursuits: Able to track stimulus in all quads without difficulty Saccades: Within functional limits Convergence: Within functional limits Visual Fields: No apparent deficits Perception  Perception: Within Functional Limits Praxis Praxis: Intact Cognition Overall Cognitive Status: Impaired/Different from baseline Arousal/Alertness: Awake/alert Orientation Level: Person;Place;Situation Person: Oriented Place: Oriented Situation: Oriented Year: 2022 Month: September Day of Week: Correct Memory: Appears intact Immediate Memory Recall: Sock;Blue;Bed Memory Recall Sock: Without Cue Memory Recall Blue: Without Cue Memory Recall Bed: Without Cue Attention: Alternating;Sustained;Focused Focused Attention: Appears intact Sustained Attention: Appears intact Alternating Attention Impairment: Functional complex Awareness: Impaired Awareness Impairment: Anticipatory impairment Problem Solving: Impaired Problem Solving Impairment: Functional complex Safety/Judgment: Appears intact Sensation Sensation Light Touch: Appears Intact Hot/Cold: Appears Intact Proprioception: Appears Intact Stereognosis: Appears Intact Coordination Gross Motor  Movements are Fluid and Coordinated: No Fine Motor Movements are Fluid and Coordinated: No Coordination and Movement Description: Pt with LUE paresis with max assist needed for hand over hand use with selfcare tasks. Motor  Motor Motor: Hemiplegia;Abnormal tone;Abnormal postural alignment and control Motor - Skilled Clinical Observations: L hemiparesis  Trunk/Postural Assessment  Cervical Assessment Cervical Assessment: Within Functional Limits Thoracic Assessment Thoracic Assessment: Within Functional Limits Lumbar Assessment Lumbar Assessment: Exceptions to [facility] (posterior pelvic tilt) Postural Control Postural Control: Deficits on evaluation Protective Responses: posterior LOB  Balance Balance Balance Assessed: Yes Static Sitting Balance Static Sitting - Balance Support: Feet supported Static Sitting - Level of Assistance: 5: Stand by assistance Dynamic Sitting Balance Dynamic Sitting - Balance Support: During functional activity Dynamic Sitting - Level of Assistance: 4: Min assist Static Standing Balance Static Standing - Balance Support: During functional activity Static Standing - Level of Assistance: 4: Min assist Dynamic Standing Balance Dynamic Standing - Balance Support: During functional activity Dynamic Standing -  Level of Assistance: 3: Mod assist Extremity/Trunk Assessment RUE Assessment RUE Assessment: Within Functional Limits LUE Assessment LUE Assessment: Exceptions to [facility] Passive Range of Motion (PROM) Comments: WFLs for all joints Active Range of Motion (AROM) Comments: Pt with Brunnstrum stage II-III movement in the left arm and hand.  Care Tool Care Tool Self Care Eating   Eating Assist Level: Set up assist    Oral Care    Oral Care Assist Level: Minimal Assistance - Patient > 75% (standing)    Bathing   Body parts bathed by patient: Left arm;Chest;Abdomen;Front perineal area;Buttocks;Right upper leg;Left upper leg;Right lower leg;Left lower  leg;Face Body parts bathed by helper: Right arm   Assist Level: Moderate Assistance - Patient 50 - 74% (mod for balance)    Upper Body Dressing(including orthotics)   What is the patient wearing?: Pull over shirt   Assist Level: Moderate Assistance - Patient 50 - 74%    Lower Body Dressing (excluding footwear)   What is the patient wearing?: Pants;Underwear/pull up Assist for lower body dressing: Moderate Assistance - Patient 50 - 74%    Putting on/Taking off footwear   What is the patient wearing?: Non-skid slipper socks;Ted hose Assist for footwear: Maximal Assistance - Patient 25 - 49%       Care Tool Toileting Toileting activity   Assist for toileting: Moderate Assistance - Patient 50 - 74%     Care Tool Bed Mobility Roll left and right activity        Sit to lying activity   Sit to lying assist level: Minimal Assistance - Patient > 75%    Lying to sitting on side of bed activity   Lying to sitting on side of bed assist level: the ability to move from lying on the back to sitting on the side of the bed with no back support.: Minimal Assistance - Patient > 75%     Care Tool Transfers Sit to stand transfer   Sit to stand assist level: Minimal Assistance - Patient > 75%    Chair/bed transfer   Chair/bed transfer assist level: Moderate Assistance - Patient 50 - 74%     Toilet transfer   Assist Level: Moderate Assistance - Patient 50 - 74%     Care Tool Cognition  Expression of Ideas and Wants Expression of Ideas and Wants: 3. Some difficulty - exhibits some difficulty with expressing needs and ideas (e.g, some words or finishing thoughts) or speech is not clear  Understanding Verbal and Non-Verbal Content Understanding Verbal and Non-Verbal Content: 3. Usually understands - understands most conversations, but misses some part/intent of message. Requires cues at times to understand   Memory/Recall Ability Memory/Recall Ability : Current season;Location of own  room;Staff names and faces;That he or she is in a hospital/hospital unit   Refer to Care Plan for [place] 1 OT Short Term Goal 1 (Week 1): Pt will complete UB dressing with supervision for pullover shirt only, OT Short Term Goal 2 (Week 1): Pt will complete LB bathing with min guard assist sit to stand for two consecutive sessions. OT Short Term Goal 3 (Week 1): Pt will complete LB dressing with min assist for two consecutive sessions. OT Short Term Goal 4 (Week 1): Pt will complete self AAROM exercises for the LUE following handout with min guard assist.  Recommendations for other services: None  and Therapeutic Recreation  Stress management   Skilled Therapeutic Intervention ADL ADL Eating: Supervision/safety Where Assessed-Eating:  Chair Grooming: Minimal assistance Where Assessed-Grooming: Standing at sink Upper Body Bathing: Minimal assistance Where Assessed-Upper Body Bathing: Chair;Shower Lower Body Bathing: Moderate assistance Where Assessed-Lower Body Bathing: Chair;Shower Upper Body Dressing: Minimal assistance Where Assessed-Upper Body Dressing: Chair Lower Body Dressing: Minimal assistance Where Assessed-Lower Body Dressing: Chair Toileting: Moderate assistance Where Assessed-Toileting: Glass blower/designer: Moderate assistance Toilet Transfer Method: Counselling psychologist: Geophysical data processor: Moderate assistance Social research officer, government Method: Heritage manager: Shower seat with back Mobility  Bed Mobility Bed Mobility: Supine to Sit;Sit to Supine Supine to Sit: Minimal Assistance - Patient > 75% Sit to Supine: Minimal Assistance - Patient > 75%  Session Note:  Pt completed shower and dressing during session.  Mod assist for functional mobility to the shower without assistive device.  She needed max assist for placing her hair in the shower cap with overall mod assist for  bathing.  Increased LOB to the left when standing in the shower.  She was unable to use the LUE to assist with washing but did attempt to hold the shower head with the LUE and overall mod assist as a stabilizer.  Mod assist for donning a pullover shirt with mod assist for donning LB clothing as well secondary to not beginning with the LLE or LUE first and then needing assist to pull over the hips.  She needed max hand over hand assist to apply deodorant with use of the LUE.  Finished session with brief education on self AAROM exercises for the LUE with return to the bed at min assist level.  Call button in reach with bed alarm in place.    Discharge Criteria: Patient will be discharged from OT if patient refuses treatment 3 consecutive times without medical reason, if treatment goals not met, if there is a change in medical status, if patient makes no progress towards goals or if patient is discharged from hospital.  The above assessment, treatment plan, treatment alternatives and goals were discussed and mutually agreed upon: by patient  Naysha Sholl OTR/L 10/18/2020, 5:23 PM

## 2020-10-18 NOTE — Progress Notes (Signed)
PROGRESS NOTE   Subjective/Complaints:  No issues overnite , discussed TEE results with pt.  Also discussed rehab process and timeframe of recovery  ROS- + constip, neg N/V/ neg abd pain  Objective:   ECHO TEE  Result Date: 10/17/2020    TRANSESOPHOGEAL ECHO REPORT   Patient Name:   Kristen Grant Date of Exam: 10/17/2020 Medical Rec #:  [id number]      Height:       69.0 in Accession #:    [id number]     Weight:       195.1 lb Date of Birth:  11/27/1995       BSA:          2.044 m Patient Age:    25 years       BP:           126/57 mmHg Patient Gender: F              HR:           96 bpm. Exam Location:  Inpatient Procedure: Transesophageal Echo, Cardiac Doppler, Color Doppler and 3D Echo Indications:     Stroke  History:         Patient has prior history of Echocardiogram examinations, most                  recent 10/11/2020.  Sonographer:     Roosvelt Maser RDCS Referring Phys:  [id number] [facility] B ROBERTS Diagnosing Phys: Donato Schultz MD PROCEDURE: The patient was intubated. The transesophogeal probe was passed without difficulty through the esophogus of the patient. Sedation performed by different physician. The patient was monitored while under deep sedation. The patient's vital signs;  including heart rate, blood pressure, and oxygen saturation; remained stable throughout the procedure. The patient developed no complications during the procedure. IMPRESSIONS  1. There are very few bubbles seen in cross over upon second bubble study performed. A small PFO may be present. There is also small color flow Doppler signal seen at PFO sight.  2. Left ventricular ejection fraction, by estimation, is 60 to 65%. The left ventricle has normal function.  3. Right ventricular systolic function is normal. The right ventricular size is normal.  4. No left atrial/left atrial appendage thrombus was detected.  5. The mitral valve is normal in structure. No evidence of  mitral valve regurgitation.  6. The aortic valve is normal in structure. Aortic valve regurgitation is not visualized.  7. Minimal evidence of shunting. FINDINGS  Left Ventricle: Left ventricular ejection fraction, by estimation, is 60 to 65%. The left ventricle has normal function. The left ventricular internal cavity size was small. Right Ventricle: The right ventricular size is normal. No increase in right ventricular wall thickness. Right ventricular systolic function is normal. Left Atrium: Left atrial size was normal in size. No left atrial/left atrial appendage thrombus was detected. Right Atrium: Right atrial size was normal in size. Pericardium: There is no evidence of pericardial effusion. Mitral Valve: The mitral valve is normal in structure. No evidence of mitral valve regurgitation. Tricuspid Valve: The tricuspid valve is normal in structure. Tricuspid valve regurgitation is not demonstrated. Aortic Valve: The aortic valve  is normal in structure. Aortic valve regurgitation is not visualized. Pulmonic Valve: The pulmonic valve was normal in structure. Pulmonic valve regurgitation is not visualized. Aorta: The aortic root is normal in size and structure. IAS/Shunts: Minimal evidence of shunting. Agitated saline contrast was given intravenously to evaluate for intracardiac shunting. Donato Schultz MD Electronically signed by Donato Schultz MD Signature Date/Time: 10/17/2020/2:33:17 PM    Final    Recent Labs    10/18/20 0517  WBC 12.5*  HGB 12.7  HCT 37.3  PLT 314   Recent Labs    10/18/20 0517  NA 138  K 3.5  CL 103  CO2 19*  GLUCOSE 106*  BUN 15  CREATININE 0.59  CALCIUM 9.6    Intake/Output Summary (Last 24 hours) at 10/18/2020 0749 Last data filed at 10/17/2020 1930 Gross per 24 hour  Intake 600 ml  Output --  Net 600 ml        Physical Exam: Vital Signs Blood pressure 116/72, pulse 84, temperature 98.9 F (37.2 C), temperature source Oral, resp. rate 16, height 5\' 9"  (1.753  m), weight 88.1 kg, SpO2 100 %.   General: No acute distress Mood and affect are appropriate Heart: Regular rate and rhythm no rubs murmurs or extra sounds Lungs: Clear to auscultation, breathing unlabored, no rales or wheezes Abdomen: Positive bowel sounds, soft nontender to palpation, nondistended Extremities: No clubbing, cyanosis, or edema Skin: No evidence of breakdown, no evidence of rash  Neurologic: Cranial nerves II through XII intact, motor strength is 5/5 in bilateral deltoid, bicep, tricep, grip, hip flexor, knee extensors, ankle dorsiflexor and plantar flexor Sensory exam normal sensation to light touch and proprioception in bilateral upper and lower extremities Cerebellar exam normal finger to nose to finger as well as heel to shin in bilateral upper and lower extremities Musculoskeletal: Full range of motion in all 4 extremities. No joint swelling    Assessment/Plan: 1. Functional deficits which require 3+ hours per day of interdisciplinary therapy in a comprehensive inpatient rehab setting. Physiatrist is providing close team supervision and 24 hour management of active medical problems listed below. Physiatrist and rehab team continue to assess barriers to discharge/monitor patient progress toward functional and medical goals  Care Tool:  Bathing              Bathing assist       Upper Body Dressing/Undressing Upper body dressing        Upper body assist Assist Level: Moderate Assistance - Patient 50 - 74%    Lower Body Dressing/Undressing Lower body dressing            Lower body assist       Toileting Toileting    Toileting assist Assist for toileting: Minimal Assistance - Patient > 75%     Transfers Chair/bed transfer  Transfers assist           Locomotion Ambulation   Ambulation assist              Walk 10 feet activity   Assist           Walk 50 feet activity   Assist           Walk 150 feet  activity   Assist           Walk 10 feet on uneven surface  activity   Assist           Wheelchair     Assist  Wheelchair 50 feet with 2 turns activity    Assist            Wheelchair 150 feet activity     Assist          Blood pressure 116/72, pulse 84, temperature 98.9 F (37.2 C), temperature source Oral, resp. rate 16, height 5\' 9"  (1.753 m), weight 88.1 kg, SpO2 100 %.  Medical Problem List and Plan: 1.  Left-sided weakness with dysarthria secondary to right MCA right MCA territory infarct predominantly right caudate putamen anterior limb right internal capsule and overlying right frontal lobe secondary to right M1 occlusion status post revascularization mechanical thrombectomy 10/11/2020 as well as requiring repeat thrombectomy 10/12/2020 for occluded dominant superior division of right MCA. CVA of cryptogenic origin. TEE really unremarkable. ? Small PFO             -patient may  shower             -ELOS/Goals: 10-14 days, goals mod I to supervision 2.  Antithrombotics: -DVT/anticoagulation:  Mechanical: Sequential compression devices, below knee Bilateral lower extremities             -antiplatelet therapy: Aspirin 81 mg daily 3. Pain Management: Tylenol as needed 4. Mood/anxiety : Team to Provide emotional support             -antipsychotic agents: N/A             -add low dose xanax for anxity 5. Neuropsych: This patient is capable of making decisions on her own behalf. 6. Skin/Wound Care: Routine skin checks 7. Fluids/Electrolytes/Nutrition: Routine in and outs with follow-up chemistries 8.  Hyperlipidemia.  Lipitor 9.  Urine drug screen positive marijuana.  Counseling 10.  Obesity.  BMI 29.92.  Dietary follow-up  11. Constipation order prn laxative 12.  PFO looks smaller on TEE than on TTE, unlikely to require PFO closure, will f/u with neuro as OP after inpt rehab  LOS: 1 days A FACE TO FACE EVALUATION WAS  PERFORMED  12/12/2020 10/18/2020, 7:49 AM

## 2020-10-19 DIAGNOSIS — I69354 Hemiplegia and hemiparesis following cerebral infarction affecting left non-dominant side: Secondary | ICD-10-CM

## 2020-10-19 DIAGNOSIS — G8110 Spastic hemiplegia affecting unspecified side: Secondary | ICD-10-CM

## 2020-10-19 DIAGNOSIS — E785 Hyperlipidemia, unspecified: Secondary | ICD-10-CM

## 2020-10-19 DIAGNOSIS — D72829 Elevated white blood cell count, unspecified: Secondary | ICD-10-CM

## 2020-10-19 MED ORDER — BACLOFEN 5 MG HALF TABLET
5.0000 mg | ORAL_TABLET | Freq: Every day | ORAL | Status: DC
  Administered 2020-10-19 – 2020-10-25 (×7): 5 mg via ORAL
  Filled 2020-10-19 (×7): qty 1

## 2020-10-19 NOTE — Progress Notes (Signed)
Occupational Therapy Session Note  Patient Details  Name: Kristen Grant MRN: [id number] Date of Birth: 06-Aug-1995  Today's Date: 10/19/2020 OT Individual Time: 9371-6967 OT Individual Time Calculation (min): 43 min    Short Term Goals: Week 1:  OT Short Term Goal 1 (Week 1): Pt will complete UB dressing with supervision for pullover shirt only, OT Short Term Goal 2 (Week 1): Pt will complete LB bathing with min guard assist sit to stand for two consecutive sessions. OT Short Term Goal 3 (Week 1): Pt will complete LB dressing with min assist for two consecutive sessions. OT Short Term Goal 4 (Week 1): Pt will complete self AAROM exercises for the LUE following handout with min guard assist.  Skilled Therapeutic Interventions/Progress Updates:    Pt greeted at time of session supine in bed with PRAFO on, eager to get OOB. No pain reported except at IV site on LUE with weight bearing, resolved with positional change. Removed PRAFO and donned pants sit > stand level Min/Mod A and pt recalling to thread LLE first. Stand pivot no AD to R side with Min A. Wheelchair transport for time conservation > gym. Stand pivot wheelchair > mat Min A toward L side. Focused on standing balance and LUE/LLE NMR with the following: sit <> stands 1x10 with mirror for feedback with LUE in belt for proprioceptive feedback, cone taps alternating BLEs 1x15 each, bean bag taps remembering 4 colors in order with 100% accuracy w/ LLE. Pt also performing functional mobility short distances in the following order: 5", 10" and 20" with HHA/Min. All transfers throughout session CGA/Min. Back in room alarm on call bell in reach. Pt stating sister coming later for visit, confirmed with nursing pt does have ground pass. Relayed to pt she is not to walk or stand without staff present when on visit.   Therapy Documentation Precautions:  Precautions Precautions: Fall Precaution Comments: left hemiparesis Restrictions Weight Bearing  Restrictions: No     Therapy/Group: Individual Therapy  Erasmo Score 10/19/2020, 12:36 PM

## 2020-10-19 NOTE — Progress Notes (Signed)
Ortho tech called re: brace

## 2020-10-19 NOTE — Progress Notes (Signed)
PROGRESS NOTE   Subjective/Complaints: Patient seen laying in bed this morning.  She states she slept well overnight.  She does not remember seeing a physician yesterday.  She states she had a good first day of therapies yesterday.  ROS: Denies CP, SOB, N/V/D  Objective:   ECHO TEE  Result Date: 10/17/2020    TRANSESOPHOGEAL ECHO REPORT   Patient Name:   Kristen Grant Date of Exam: 10/17/2020 Medical Rec #:  [id number]      Height:       69.0 in Accession #:    [id number]     Weight:       195.1 lb Date of Birth:  Dec 10, 1995       BSA:          2.044 m Patient Age:    25 years       BP:           126/57 mmHg Patient Gender: F              HR:           96 bpm. Exam Location:  Inpatient Procedure: Transesophageal Echo, Cardiac Doppler, Color Doppler and 3D Echo Indications:     Stroke  History:         Patient has prior history of Echocardiogram examinations, most                  recent 10/11/2020.  Sonographer:     Roosvelt Maser RDCS Referring Phys:  [id number] [facility] B ROBERTS Diagnosing Phys: Donato Schultz MD PROCEDURE: The patient was intubated. The transesophogeal probe was passed without difficulty through the esophogus of the patient. Sedation performed by different physician. The patient was monitored while under deep sedation. The patient's vital signs;  including heart rate, blood pressure, and oxygen saturation; remained stable throughout the procedure. The patient developed no complications during the procedure. IMPRESSIONS  1. There are very few bubbles seen in cross over upon second bubble study performed. A small PFO may be present. There is also small color flow Doppler signal seen at PFO sight.  2. Left ventricular ejection fraction, by estimation, is 60 to 65%. The left ventricle has normal function.  3. Right ventricular systolic function is normal. The right ventricular size is normal.  4. No left atrial/left atrial appendage thrombus was  detected.  5. The mitral valve is normal in structure. No evidence of mitral valve regurgitation.  6. The aortic valve is normal in structure. Aortic valve regurgitation is not visualized.  7. Minimal evidence of shunting. FINDINGS  Left Ventricle: Left ventricular ejection fraction, by estimation, is 60 to 65%. The left ventricle has normal function. The left ventricular internal cavity size was small. Right Ventricle: The right ventricular size is normal. No increase in right ventricular wall thickness. Right ventricular systolic function is normal. Left Atrium: Left atrial size was normal in size. No left atrial/left atrial appendage thrombus was detected. Right Atrium: Right atrial size was normal in size. Pericardium: There is no evidence of pericardial effusion. Mitral Valve: The mitral valve is normal in structure. No evidence of mitral valve regurgitation. Tricuspid Valve: The tricuspid valve is normal  in structure. Tricuspid valve regurgitation is not demonstrated. Aortic Valve: The aortic valve is normal in structure. Aortic valve regurgitation is not visualized. Pulmonic Valve: The pulmonic valve was normal in structure. Pulmonic valve regurgitation is not visualized. Aorta: The aortic root is normal in size and structure. IAS/Shunts: Minimal evidence of shunting. Agitated saline contrast was given intravenously to evaluate for intracardiac shunting. Donato Schultz MD Electronically signed by Donato Schultz MD Signature Date/Time: 10/17/2020/2:33:17 PM    Final    Recent Labs    10/18/20 0517  WBC 12.5*  HGB 12.7  HCT 37.3  PLT 314    Recent Labs    10/18/20 0517  NA 138  K 3.5  CL 103  CO2 19*  GLUCOSE 106*  BUN 15  CREATININE 0.59  CALCIUM 9.6     Intake/Output Summary (Last 24 hours) at 10/19/2020 0835 Last data filed at 10/18/2020 1313 Gross per 24 hour  Intake 480 ml  Output --  Net 480 ml         Physical Exam: Vital Signs Blood pressure 112/70, pulse 71, temperature 98.2  F (36.8 C), temperature source Oral, resp. rate 14, height 5\' 9"  (1.753 m), weight 88.1 kg, SpO2 99 %. Constitutional: No distress . Vital signs reviewed. HENT: Normocephalic.  Atraumatic. Eyes: EOMI. No discharge. Cardiovascular: No JVD.  RRR. Respiratory: Normal effort.  No stridor.  Bilateral clear to auscultation. GI: Non-distended.  BS +. Skin: Warm and dry.  Intact. Psych: Flat.  Normal behavior. Musc: No edema in extremities.  No tenderness in extremities. Neuro: Alert Motor: Right upper extremity/right lower extremity: 5/5 proximal distal Left lower extremity: 4/5 proximal distal Left upper extremity: Limited due to spasticity, modified Ashworth 2/4 left elbow flexors, wrist flexors, finger flexors   Assessment/Plan: 1. Functional deficits which require 3+ hours per day of interdisciplinary therapy in a comprehensive inpatient rehab setting. Physiatrist is providing close team supervision and 24 hour management of active medical problems listed below. Physiatrist and rehab team continue to assess barriers to discharge/monitor patient progress toward functional and medical goals  Care Tool:  Bathing    Body parts bathed by patient: Left arm, Chest, Abdomen, Front perineal area, Buttocks, Right upper leg, Left upper leg, Right lower leg, Left lower leg, Face   Body parts bathed by helper: Right arm     Bathing assist Assist Level: Moderate Assistance - Patient 50 - 74% (mod for balance)     Upper Body Dressing/Undressing Upper body dressing   What is the patient wearing?: Pull over shirt    Upper body assist Assist Level: Moderate Assistance - Patient 50 - 74%    Lower Body Dressing/Undressing Lower body dressing      What is the patient wearing?: Pants, Underwear/pull up     Lower body assist Assist for lower body dressing: Moderate Assistance - Patient 50 - 74%     Toileting Toileting    Toileting assist Assist for toileting: Moderate Assistance - Patient  50 - 74%     Transfers Chair/bed transfer  Transfers assist     Chair/bed transfer assist level: Moderate Assistance - Patient 50 - 74%     Locomotion Ambulation   Ambulation assist      Assist level: Moderate Assistance - Patient 50 - 74% Assistive device: Hand held assist Max distance: 10'   Walk 10 feet activity   Assist     Assist level: Moderate Assistance - Patient - 50 - 74% Assistive device: Hand held assist  Walk 50 feet activity   Assist Walk 50 feet with 2 turns activity did not occur: Safety/medical concerns         Walk 150 feet activity   Assist Walk 150 feet activity did not occur: Safety/medical concerns         Walk 10 feet on uneven surface  activity   Assist Walk 10 feet on uneven surfaces activity did not occur: Safety/medical concerns         Wheelchair     Assist Is the patient using a wheelchair?: No             Wheelchair 50 feet with 2 turns activity    Assist            Wheelchair 150 feet activity     Assist          Blood pressure 112/70, pulse 71, temperature 98.2 F (36.8 C), temperature source Oral, resp. rate 14, height 5\' 9"  (1.753 m), weight 88.1 kg, SpO2 99 %.  Medical Problem List and Plan: 1.  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 CVA of cryptogenic origin. TEE really unremarkable. ? Small PFO  Continue CIR   PRAFO/WHO ordered  Baclofen 5 nightly started on 9/17 2.  Antithrombotics: -DVT/anticoagulation:  Mechanical: Sequential compression devices, below knee Bilateral lower extremities             -antiplatelet therapy:  Aspirin 81 mg daily 3. Pain Management: Tylenol as needed 4. Mood/anxiety : Team to Provide emotional support             -antipsychotic agents: N/A             -added low dose xanax for anxity 5. Neuropsych: This patient is capable of making decisions on her own behalf. 6. Skin/Wound Care: Routine skin checks 7. Fluids/Electrolytes/Nutrition: Routine in and outs  8.  Hyperlipidemia: Lipitor 9.  Urine drug screen positive marijuana.  Counseling 10.  Obesity.  BMI 29.92.  Dietary follow-up  11. Constipation order prn laxative 12.  PFO looks smaller on TEE than on TTE, unlikely to require PFO closure, will f/u with neuro as OP after inpt rehab  14.  Leukocytosis  WBCs 12.5 on 9/16, labs ordered for Monday  Afebrile, no signs/symptoms of infection  LOS: 2 days A FACE TO FACE EVALUATION WAS PERFORMED  Kristen Grant Friday 10/19/2020, 8:35 AM

## 2020-10-19 NOTE — Progress Notes (Signed)
Orthopedic Tech Progress Note Patient Details:  Kristen Grant [birth year] [id number] Called order into [facility] Patient ID: Kristen Grant, female   DOB: July 18, 1995, 25 y.o.   MRN: [id number]  Kristen Grant 10/19/2020, 8:55 AM

## 2020-10-20 NOTE — Progress Notes (Signed)
Speech Language Pathology Daily Session Note  Patient Details  Name: Kristen Grant MRN: [id number] Date of Birth: May 17, 1995  Today's Date: 10/20/2020 SLP Individual Time: 1400-1445 SLP Individual Time Calculation (min): 45 min  Short Term Goals: Week 1: SLP Short Term Goal 1 (Week 1): Patient will complete complex problem solving tasks with supervision level verbal cues. SLP Short Term Goal 2 (Week 1): Patient will demonstrate alternating attention between 2 tasks for 30 minutes with supervision level verbal cues for redirection. SLP Short Term Goal 3 (Week 1): Patient will self-monitor and correct errors during complex tasks with supervision level verbal cues. SLP Short Term Goal 4 (Week 1): Patient will consume current diet with minimal overt s/s of aspiration with Mod I for use of swallowing compensatory strategies.  Skilled Therapeutic Interventions:  Pt was seen for skilled ST targeting cognitive goals.  Pt was sleeping upon arrival but awakened easily and was agreeable to participating in treatment.  SLP facilitated the session with a novel scheduling task targeting complex problem solving skills.  Pt was able to complete task for 100% accuracy with mod I and demonstrated multiple instances of independently recognizing and correcting errors.  SLP provided pt with a written list of her currently prescribed medications in anticipation of completing a medication management task at next available appointment to further address problem solving goals.  Pt was left in bed with bed alarm set and call bell within reach.  Continue per current plan of care.    Pain Pain Assessment Pain Scale: 0-10 Pain Score: 0-No pain  Therapy/Group: Individual Therapy  Adriyana Grant, Melanee Spry 10/20/2020, 4:29 PM

## 2020-10-20 NOTE — Progress Notes (Signed)
Occupational Therapy Session Note  Patient Details  Name: Kristen Grant MRN: [id number] Date of Birth: Jan 01, 1996  Today's Date: 10/20/2020 OT Individual Time: 1779-3903 OT Individual Time Calculation (min): 60 min    Short Term Goals: Week 1:  OT Short Term Goal 1 (Week 1): Pt will complete UB dressing with supervision for pullover shirt only, OT Short Term Goal 2 (Week 1): Pt will complete LB bathing with min guard assist sit to stand for two consecutive sessions. OT Short Term Goal 3 (Week 1): Pt will complete LB dressing with min assist for two consecutive sessions. OT Short Term Goal 4 (Week 1): Pt will complete self AAROM exercises for the LUE following handout with min guard assist.  Skilled Therapeutic Interventions/Progress Updates:    1:1 SElf care retraining at shower level. Focus on functional ambulation without AD around the room with min A. In standing activities pt does present with a lean to the left but able to self correct with weight shifts with tactile or verbal cues. Heavy emphasis in ADL today on function use of [name] in all tasks. A with washing her right side with [name] with mod A to assist her arm into gravity eliminated plane; with focus on shoulder protraction/retraction, elbow extension and flexion. Also addressed this motions in with functional reach with [name] to retrieve clothing items positioned to the left of her. Min A at times to lift against gravity. Pt required max cues to try to always incorporate her left hand in all tasks. Min A to don shirt and mod with pants with [name] assistance. Pt able to don regular socks with setup (didn't don shoes but based on performance in session pt could don shoes but not fasten. Pt required A to brush and dry hair. Pt returned to bed with min A for ambulation from sink to bed and min guard to return to supine. Left resting in bed .   Therapy Documentation Precautions:  Precautions Precautions: Fall Precaution  Comments: left hemiparesis Restrictions Weight Bearing Restrictions: No  Pain:  No c/o pain in session   Therapy/Group: Individual Therapy  Roney Mans [facility] 10/20/2020, 9:15 AM

## 2020-10-20 NOTE — Progress Notes (Signed)
Physical Therapy Session Note  Patient Details  Name: Kristen Grant MRN: [id number] Date of Birth: 1995-03-31  Today's Date: 10/20/2020 PT Individual Time: 1132-1249 PT Individual Time Calculation (min): 77 min   Short Term Goals: Week 1:  PT Short Term Goal 1 (Week 1): Patient will ambulate at least 70ft with MinA and LRAD PT Short Term Goal 2 (Week 1): Patient will transfer bed <> wc with LRAD and MinA consistently PT Short Term Goal 3 (Week 1): Patient will score >12/56 on the BBS  Skilled Therapeutic Interventions/Progress Updates:    Pt received supine in bed with her significant other, [name], present and pt agreeable to therapy session. Supine>sitting L EOB with close supervision for safety. Sitting EOB supervision for trunk control donned tennis shoes total assist for time management. R stand pivot EOB>w/c, no AD, with light min assist for balance.  Transported to/from gym in w/c for time management and energy conservation.    Gait training 265ft, no AD, with min assist for balance - noticed to have decreased gait speed and decreased L LE step length along with overall low tone appearing trunk/hip control with increased sway/movement - cuing for increased L hip abductor activation during stance to improve stance control as pt reporting mild hip discomfort - pt able to advance L LE during swing with no assist and adequate foot clearance.  Standing mini-squats 2x 12 reps with cuing/facilitation for improved hip hinge and posterior weight shift as opposed to spine flexion due to pt reporting hx of back pain.   Tall kneeling mini-squats, R LE lateral "stepping" in/out, cross body reaching promoting L UE WBing on bench for NMR - all with min assist for balance and cuing for increased and sustained L hip extensor and abductor activation.  Dynamic gait training using agility ladder including the following: - forward reciprocal stepping x4 - cuing for improved forward advancement/increased  propulsion strength and speed to take longer steps - lateral stepping x6 reps, improved L LE step length with repetition - backwards step-to progressed to reciprocal stepping with manual facilitation for weight shifting and cuing for L hip/knee flexion followed by hip extension to clear foot over ladder *Requires min assist for balance throughout primarily due to L lean/LOB.   Sitting EOM L UE NMR of grasping bean bags seated on mat next to her then placing them on low table in front of her - with increased time able to complete without physical assistance.   Gait training ~147ft 2x to ortho gym then to pt's room with min assist for balance due to L lean - with fatigue demonstrates decreased L LE foot clearance and continues to have overall more instability in trunk/hip/pelvis but does not causing significant LOB.  B LE reciprocal movement patterns on Nustep against level 8 resistance for 5 min with therapist providing music for encouragement/motivation to move at increased speeds.   At end of session pt left supine in bed, HOB elevated, with meal tray set-up and bed alarm on.   Therapy Documentation Precautions:  Precautions Precautions: Fall Precaution Comments: left hemiparesis Restrictions Weight Bearing Restrictions: No   Pain:  Reports mild discomfort in L hip initially with gait that improved after hip abductor activating exercises.    Therapy/Group: Individual Therapy  Ginny Forth , PT, DPT, NCS, CSRS 10/20/2020, 11:51 AM

## 2020-10-21 LAB — CBC WITH DIFFERENTIAL/PLATELET
Abs Immature Granulocytes: 0.02 10*3/uL (ref 0.00–0.07)
Basophils Absolute: 0.1 10*3/uL (ref 0.0–0.1)
Basophils Relative: 1 %
Eosinophils Absolute: 0.3 10*3/uL (ref 0.0–0.5)
Eosinophils Relative: 3 %
HCT: 34.9 % — ABNORMAL LOW (ref 36.0–46.0)
Hemoglobin: 11.6 g/dL — ABNORMAL LOW (ref 12.0–15.0)
Immature Granulocytes: 0 %
Lymphocytes Relative: 28 %
Lymphs Abs: 2.3 10*3/uL (ref 0.7–4.0)
MCH: 28.9 pg (ref 26.0–34.0)
MCHC: 33.2 g/dL (ref 30.0–36.0)
MCV: 86.8 fL (ref 80.0–100.0)
Monocytes Absolute: 0.7 10*3/uL (ref 0.1–1.0)
Monocytes Relative: 8 %
Neutro Abs: 5 10*3/uL (ref 1.7–7.7)
Neutrophils Relative %: 60 %
Platelets: 328 10*3/uL (ref 150–400)
RBC: 4.02 MIL/uL (ref 3.87–5.11)
RDW: 13.2 % (ref 11.5–15.5)
WBC: 8.4 10*3/uL (ref 4.0–10.5)
nRBC: 0 % (ref 0.0–0.2)

## 2020-10-21 NOTE — Progress Notes (Signed)
Physical Therapy Session Note  Patient Details  Name: Kristen Grant MRN: [id number] Date of Birth: [birth year]  Today's Date: 10/21/2020 PT Individual Time: 0832-0930 PT Individual Time Calculation (min): 58 min   Short Term Goals: Week 1:  PT Short Term Goal 1 (Week 1): Patient will ambulate at least 56ft with MinA and LRAD PT Short Term Goal 2 (Week 1): Patient will transfer bed <> wc with LRAD and MinA consistently PT Short Term Goal 3 (Week 1): Patient will score >12/56 on the BBS  Skilled Therapeutic Interventions/Progress Updates:    Patient received sitting up in bed, RN present to give AM rx, agreeable to PT. She reports 3/10 pain in L wrist and headache- premedicated. PT providing rest breaks and distractions to assist with pain management. She was able to ambulate to bathroom with CGA and no AD. Continent of bladder, supervision for perihygiene and clothing management. Patient ambulating to therapy gym with no AD and CGA/MinA. L lateral trunk lean and L hip instability noted. Patient does report that she feels as though her gait pattern is at baseline, questioning insight into deficits. Patient completing dynamic standing balance task on solid ground using L UE to manipulate beanbags. Progressed to standing on foam with no LOB, L UE fatiguing very quickly. As L UE fatigued, patient with more attention drawn to UE resulting in increasing instability. Required grossly CGA for safety. Patient in quadruped with L elbow blocked completing reaching task with R UE. Patient with tendency to sit back onto her heels to alleviate weight from L UE despite multimodal cues to not do so. Patient sitting on physioball for improved truncal engagement. Completing seated marches on physioball and B UE punches. Patient resuming significant posterior pelvic tilt and instability when lifting lower extremities or with minor truncal rotation with punches. Patient ambulating back to her room with cues to engage core  and L hip stabilizers. Minor improvement in overall gait mechanics. Patient requesting to use bathroom- continent of bowel. Returning to bed. Bed alarm on, call light within reach.   Therapy Documentation Precautions:  Precautions Precautions: Fall Precaution Comments: left hemiparesis Restrictions Weight Bearing Restrictions: No    Therapy/Group: Individual Therapy  Elizebeth Koller, PT, DPT, CBIS  10/21/2020, 7:33 AM

## 2020-10-21 NOTE — Progress Notes (Signed)
Occupational Therapy Session Note  Patient Details  Name: Kristen Grant MRN: [id number] Date of Birth: August 28, 1995  Today's Date: 10/21/2020 OT Individual Time: 1400-1457 and 1517-6160 OT Individual Time Calculation (min): 57 min and 41 min   Short Term Goals: Week 1:  OT Short Term Goal 1 (Week 1): Pt will complete UB dressing with supervision for pullover shirt only, OT Short Term Goal 2 (Week 1): Pt will complete LB bathing with min guard assist sit to stand for two consecutive sessions. OT Short Term Goal 3 (Week 1): Pt will complete LB dressing with min assist for two consecutive sessions. OT Short Term Goal 4 (Week 1): Pt will complete self AAROM exercises for the LUE following handout with min guard assist.  Skilled Therapeutic Interventions/Progress Updates:    Session 1: Pt greeted at time of session semireclined in bed resting no pain throughout session, agreeable to OT session focused on LUE NMR/ROM. Pt initially ambulating to toilet in room CGA and transferred same manner, hygiene seated and Min A to fully get up underwear/pants. Walked room > ortho gym > big gym > back to room with CGA throughout with minimal LOB as pt sometimes sways to L side but is able to correct. Performed SCIFIT seated on level 1 initially upgrading to 2.5 for 5 mins forward for LUE NMR and therapist providing scapular facilitation and ace bandage on hand. 2x15 of the following: supination/pronation with small weighted ball, forward punches for shoulder flexion and elbow extension, and lateral leans for weight bearing. Note therapist providing MOD facilitation distally at wrist and elbow for some ROM. 2x10 wall push ups as well in standing for more proprioceptive input and NMR. Back in room, alarm on call bell in reach. Note facilitation of having pt use LUE throughout all session to put up hair in ponytail, hold cup/straw, etc.    Session 2: Pt greeted at time of session resting bed level, no pain, agreeable to  OT session. Ambulated room <> ortho gym with cues for pacing with CGA. In gym, performed 2 rounds of seated BITS activities for LUE NMR and FMC/GMC retraining for hitting dots on size 10 and alphabet A-Z with therapist providing distal support at wrist and elbow, MOD A overall but increased assist with fatigue. Dynamic standing activities while maneuvering obstacle course x2 trials with CGA making tight turns and maneuvering obstacles, 1x20 toe taps with multiple LOB when in single leg stance on LLE, able to recover Min/CGA. Ambulated back to room CGA, alarm on call bell in reach.   Therapy Documentation Precautions:  Precautions Precautions: Fall Precaution Comments: left hemiparesis Restrictions Weight Bearing Restrictions: No     Therapy/Group: Individual Therapy  Erasmo Score 10/21/2020, 1:59 PM

## 2020-10-21 NOTE — Progress Notes (Signed)
Speech Language Pathology Daily Session Note  Patient Details  Name: Kristen Grant MRN: [id number] Date of Birth: Nov 25, 1995  Today's Date: 10/21/2020 SLP Individual Time: 1000-1045 SLP Individual Time Calculation (min): 45 min  Short Term Goals: Week 1: SLP Short Term Goal 1 (Week 1): Patient will complete complex problem solving tasks with supervision level verbal cues. SLP Short Term Goal 2 (Week 1): Patient will demonstrate alternating attention between 2 tasks for 30 minutes with supervision level verbal cues for redirection. SLP Short Term Goal 3 (Week 1): Patient will self-monitor and correct errors during complex tasks with supervision level verbal cues. SLP Short Term Goal 4 (Week 1): Patient will consume current diet with minimal overt s/s of aspiration with Mod I for use of swallowing compensatory strategies.  Skilled Therapeutic Interventions: Patient agreeable to skilled ST intervention with focus on cognitive goals. Pt appeared externally distracted by TV and phone this date, thus requesting to turn off TV for duration of treatment. SLP facilitated session by providing sup A verbal cues progressing to mod I for safety with medication management and problem solving scenarios. Pt verbalized logical solutions to medication related problems and also successfully interpreted medication labels with 100% accuracy. Pt indicated interest in using weekly pillbox. Unable to progress to organizing pillbox due to time constraints, however patient was able to ID which pill box would be most effective based on certain prescription frequencies (QD/BID/TID). Physical therapy reported coughing with medicine this morning. PT reported meds were taken whole with thin liquid vs. recommendation for whole with puree as stated on communication boards in room. Pt did not recall coughing this a.m. Reinforced safe swallow precautions and strategies and provided education on mixed consistencies. Cont to recommend  meds whole in puree. Pt verbalized understanding through teach back by stating she would take them with puree. Patient was left in bed with alarm activated and immediate needs within reach at end of session. Continue per current plan of care.      Pain Pain Assessment Pain Scale: 0-10 Pain Score: 0-No pain Pain Type: Acute pain Pain Location: Generalized (arm, head) Pain Orientation: Right Pain Frequency: Intermittent Pain Onset: Sudden Pain Intervention(s): Medication (See eMAR)  Therapy/Group: Individual Therapy  Tamala Ser 10/21/2020, 10:19 AM

## 2020-10-21 NOTE — Progress Notes (Signed)
PROGRESS NOTE   Subjective/Complaints:   No issues overnite  No pain c/os, denies any transient episode of numbness or weakness in LUE or LLE PTA  ROS: Denies CP, SOB, N/V/D  Objective:   No results found. Recent Labs    10/21/20 0805  WBC 8.4  HGB 11.6*  HCT 34.9*  PLT 328    No results for input(s): NA, K, CL, CO2, GLUCOSE, BUN, CREATININE, CALCIUM in the last 72 hours.   Intake/Output Summary (Last 24 hours) at 10/21/2020 0816 Last data filed at 10/20/2020 1906 Gross per 24 hour  Intake 236 ml  Output --  Net 236 ml         Physical Exam: Vital Signs Blood pressure 110/74, pulse 69, temperature 98.4 F (36.9 C), temperature source Oral, resp. rate 17, height 5\' 9"  (1.753 m), weight 88.1 kg, SpO2 99 %.  General: No acute distress Mood and affect are appropriate Heart: Regular rate and rhythm no rubs murmurs or extra sounds Lungs: Clear to auscultation, breathing unlabored, no rales or wheezes Abdomen: Positive bowel sounds, soft nontender to palpation, nondistended Extremities: No clubbing, cyanosis, or edema Skin: No evidence of breakdown, no evidence of rash   Motor: Right upper extremity/right lower extremity: 5/5 proximal distal Left lower extremity: 4/5 proximal distal Left upper extremity: Limited due to spasticity, modified [place] 2/4 left elbow flexors, wrist flexors, finger flexors   Assessment/Plan: 1. Functional deficits which require 3+ hours per day of interdisciplinary therapy in a comprehensive inpatient rehab setting. Physiatrist is providing close team supervision and 24 hour management of active medical problems listed below. Physiatrist and rehab team continue to assess barriers to discharge/monitor patient progress toward functional and medical goals  Care Tool:  Bathing    Body parts bathed by patient: Left arm, Chest, Abdomen, Front perineal area, Buttocks, Right upper leg,  Left upper leg, Right lower leg, Left lower leg, Face   Body parts bathed by helper: Right arm     Bathing assist Assist Level: Minimal Assistance - Patient > 75%     Upper Body Dressing/Undressing Upper body dressing   What is the patient wearing?: Pull over shirt    Upper body assist Assist Level: Minimal Assistance - Patient > 75%    Lower Body Dressing/Undressing Lower body dressing      What is the patient wearing?: Pants, Underwear/pull up     Lower body assist Assist for lower body dressing: Moderate Assistance - Patient 50 - 74%     Toileting Toileting    Toileting assist Assist for toileting: Minimal Assistance - Patient > 75%     Transfers Chair/bed transfer  Transfers assist     Chair/bed transfer assist level: Minimal Assistance - Patient > 75%     Locomotion Ambulation   Ambulation assist      Assist level: Minimal Assistance - Patient > 75% Assistive device: No Device Max distance: 218ft   Walk 10 feet activity   Assist     Assist level: Moderate Assistance - Patient - 50 - 74% Assistive device: Hand held assist   Walk 50 feet activity   Assist Walk 50 feet with 2 turns activity did not  occur: Safety/medical concerns         Walk 150 feet activity   Assist Walk 150 feet activity did not occur: Safety/medical concerns         Walk 10 feet on uneven surface  activity   Assist Walk 10 feet on uneven surfaces activity did not occur: Safety/medical concerns         Wheelchair     Assist Is the patient using a wheelchair?: No             Wheelchair 50 feet with 2 turns activity    Assist            Wheelchair 150 feet activity     Assist          Blood pressure 110/74, pulse 69, temperature 98.4 F (36.9 C), temperature source Oral, resp. rate 17, height 5\' 9"  (1.753 m), weight 88.1 kg, SpO2 99 %.  Medical Problem List and Plan: 1.  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 CVA of cryptogenic origin. TEE really unremarkable. ? Small PFO  Continue CIR   PRAFO/WHO ordered  Baclofen 5 nightly started on 9/17- no evidence of spasticity  2.  Antithrombotics: -DVT/anticoagulation:  Mechanical: Sequential compression devices, below knee Bilateral lower extremities             -antiplatelet therapy: Aspirin 81 mg daily 3. Pain Management: Tylenol as needed 4. Mood/anxiety : Team to Provide emotional support             -antipsychotic agents: N/A             -added low dose xanax for anxity 5. Neuropsych: This patient is capable of making decisions on her own behalf. 6. Skin/Wound Care: Routine skin checks 7. Fluids/Electrolytes/Nutrition: Routine in and outs  8.  Hyperlipidemia: Lipitor 9.  Urine drug screen positive marijuana.  Counseling 10.  Obesity.  BMI 29.92.  Dietary follow-up  11. Constipation order prn laxative 12.  PFO looks smaller on TEE than on TTE, unlikely to require PFO closure, will f/u with neuro as OP after inpt rehab  14.  Leukocytosis  WBCs 12.5 on 9/16, labs ordered for Monday  Afebrile, no signs/symptoms of infection  LOS: 4 days A FACE TO FACE EVALUATION WAS PERFORMED  Tuesday 10/21/2020, 8:16 AM

## 2020-10-22 NOTE — Progress Notes (Signed)
PROGRESS NOTE   Subjective/Complaints:   No issues overnite except left shoulder sore when she slept on it, feels ok this am Discussed bed positioning at night , no prior hx of shoulder pain   ROS: Denies CP, SOB, N/V/D  Objective:   No results found. Recent Labs    10/21/20 0805  WBC 8.4  HGB 11.6*  HCT 34.9*  PLT 328    No results for input(s): NA, K, CL, CO2, GLUCOSE, BUN, CREATININE, CALCIUM in the last 72 hours.   Intake/Output Summary (Last 24 hours) at 10/22/2020 0728 Last data filed at 10/21/2020 1338 Gross per 24 hour  Intake 560 ml  Output --  Net 560 ml         Physical Exam: Vital Signs Blood pressure 121/69, pulse 64, temperature 98.3 F (36.8 C), resp. rate 14, height 5\' 9"  (1.753 m), weight 88.1 kg, SpO2 99 %.  General: No acute distress Mood and affect are appropriate Heart: Regular rate and rhythm no rubs murmurs or extra sounds Lungs: Clear to auscultation, breathing unlabored, no rales or wheezes Abdomen: Positive bowel sounds, soft nontender to palpation, nondistended Extremities: No clubbing, cyanosis, or edema Skin: No evidence of breakdown, no evidence of rash  Motor: Right upper extremity/right lower extremity: 5/5 proximal distal Left lower extremity: 4/5 proximal distal Left upper extremity: 3/5 delt bi tri finger flex/ext  Assessment/Plan: 1. Functional deficits which require 3+ hours per day of interdisciplinary therapy in a comprehensive inpatient rehab setting. Physiatrist is providing close team supervision and 24 hour management of active medical problems listed below. Physiatrist and rehab team continue to assess barriers to discharge/monitor patient progress toward functional and medical goals  Care Tool:  Bathing    Body parts bathed by patient: Left arm, Chest, Abdomen, Front perineal area, Buttocks, Right upper leg, Left upper leg, Right lower leg, Left lower leg,  Face   Body parts bathed by helper: Right arm     Bathing assist Assist Level: Minimal Assistance - Patient > 75%     Upper Body Dressing/Undressing Upper body dressing   What is the patient wearing?: Pull over shirt    Upper body assist Assist Level: Minimal Assistance - Patient > 75%    Lower Body Dressing/Undressing Lower body dressing      What is the patient wearing?: Pants, Underwear/pull up     Lower body assist Assist for lower body dressing: Moderate Assistance - Patient 50 - 74%     Toileting Toileting    Toileting assist Assist for toileting: Minimal Assistance - Patient > 75%     Transfers Chair/bed transfer  Transfers assist     Chair/bed transfer assist level: Minimal Assistance - Patient > 75%     Locomotion Ambulation   Ambulation assist      Assist level: Minimal Assistance - Patient > 75% Assistive device: No Device Max distance: 263ft   Walk 10 feet activity   Assist     Assist level: Moderate Assistance - Patient - 50 - 74% Assistive device: Hand held assist   Walk 50 feet activity   Assist Walk 50 feet with 2 turns activity did not occur: Safety/medical concerns  Walk 150 feet activity   Assist Walk 150 feet activity did not occur: Safety/medical concerns         Walk 10 feet on uneven surface  activity   Assist Walk 10 feet on uneven surfaces activity did not occur: Safety/medical concerns         Wheelchair     Assist Is the patient using a wheelchair?: No             Wheelchair 50 feet with 2 turns activity    Assist            Wheelchair 150 feet activity     Assist          Blood pressure 121/69, pulse 64, temperature 98.3 F (36.8 C), resp. rate 14, height 5\' 9"  (1.753 m), weight 88.1 kg, SpO2 99 %.  Medical Problem List and Plan: 1.  Left-sided hemiparesis with dysarthria secondary to right MCA right MCA territory infarct predominantly right caudate putamen  anterior limb right internal capsule and overlying right frontal lobe secondary to right M1 occlusion status post revascularization mechanical thrombectomy 10/11/2020 as well as requiring repeat thrombectomy 10/12/2020 for occluded dominant superior division of right MCA. CVA of cryptogenic origin. TEE really unremarkable. ? Small PFO  Continue CIR   PRAFO/WHO ordered  Baclofen 5 nightly started on 9/17- has tight finger flexors  2.  Antithrombotics: -DVT/anticoagulation:  Mechanical: Sequential compression devices, below knee Bilateral lower extremities             -antiplatelet therapy: Aspirin 81 mg daily 3. Pain Management: Tylenol as needed 4. Mood/anxiety : Team to Provide emotional support             -antipsychotic agents: N/A             -added low dose xanax for anxity 5. Neuropsych: This patient is capable of making decisions on her own behalf. 6. Skin/Wound Care: Routine skin checks 7. Fluids/Electrolytes/Nutrition: Routine in and outs  8.  Hyperlipidemia: Lipitor 9.  Urine drug screen positive marijuana.  Counseling 10.  Obesity.  BMI 29.92.  Dietary follow-up  11. Constipation order prn laxative 12.  PFO looks smaller on TEE than on TTE, unlikely to require PFO closure, will f/u with neuro as OP after inpt rehab  14.  Leukocytosis- resolved   WBCs 12.5 on 9/16, now normal at 8.6K 15.  Anemia , normocytic, pt denies having menstrual period    LOS: 5 days A FACE TO FACE EVALUATION WAS PERFORMED  10/16 10/22/2020, 7:28 AM

## 2020-10-22 NOTE — Progress Notes (Signed)
Physical Therapy Session Note  Patient Details  Name: Kristen Grant MRN: [id number] Date of Birth: 13-Mar-1995  Today's Date: 10/22/2020 PT Individual Time: 0800-0859 PT Individual Time Calculation (min): 59 min   Short Term Goals: Week 1:  PT Short Term Goal 1 (Week 1): Patient will ambulate at least 38ft with MinA and LRAD PT Short Term Goal 2 (Week 1): Patient will transfer bed <> wc with LRAD and MinA consistently PT Short Term Goal 3 (Week 1): Patient will score >12/56 on the BBS  Skilled Therapeutic Interventions/Progress Updates:  Patient supine in bed on entrance to room and completing breakfast. Patient alert and agreeable to PT session.   Patient with no pain complaint throughout session.  Therapeutic Activity: Bed Mobility: Patient performed supine <> sit with supervision. No vc required for technique or effort. Transfers: Patient performed sit<>stand and stand pivot transfers throughout session with supervision and good RUE use for positioning.  Gait Training:  Patient ambulated >200' x1 with no AD to reach main therapy gym and then >400' x1 with no AD leaving main gym, circling day room, and returning to room on [place]. Demonstrated minimal intermittent variation in path requiring vc throughout for increasing hip/ knee/ ankle flexion and improving heel strike with LLE for improved L toe clearance.   Neuromuscular Re-ed: NMR facilitated during session with focus on standing dynamic balance, muscle activation/ motor control, proprioception. Pt guided in minisquats with holds during rise and descent with pt holding in motion for 5sec. Good midline positioning throughout and noted good quad activation/ control. Next guided in toe taps to steps touching first, then second, then first steps. Next progressed to toe taps to cones placed on steps. Next guided in forward lunges to 2nd step with no UE support 3x5 to LLE and to RLE.   Dynamic stepping challenged with stepping over 6  cones in successive stepping with LLE only out and back x6. Progressed to stepping with reciprocal lateral toe taps to cones with each LE. X4 and slowing in performance with focus on motor control and balance.   NMR performed for improvements in motor control and coordination, balance, sequencing, judgement, and self confidence/ efficacy in performing all aspects of mobility at highest level of independence.   Patient supine  in bed at end of session with brakes locked, bed alarm set, and all needs within reach.     Therapy Documentation Precautions:  Precautions Precautions: Fall Precaution Comments: left hemiparesis Restrictions Weight Bearing Restrictions: No General:   Vital Signs: Therapy Vitals Temp: 98.3 F (36.8 C) Pulse Rate: 64 Resp: 14 BP: 121/69 Patient Position (if appropriate): Lying Oxygen Therapy SpO2: 99 % O2 Device: Room Air Pain: Pain Assessment Pain Scale: 0-10 Pain Score: 0-No pain Pain Location: Shoulder Pain Orientation: Left  Therapy/Group: Individual Therapy  Loel Dubonnet PT, DPT 10/22/2020, 7:58 AM

## 2020-10-22 NOTE — Progress Notes (Signed)
Speech Language Pathology Daily Session Note  Patient Details  Name: Kristen Grant MRN: [id number] Date of Birth: Dec 05, 1995  Today's Date: 10/22/2020 SLP Individual Time: 2585-2778 SLP Individual Time Calculation (min): 40 min  Short Term Goals: Week 1: SLP Short Term Goal 1 (Week 1): Patient will complete complex problem solving tasks with supervision level verbal cues. SLP Short Term Goal 2 (Week 1): Patient will demonstrate alternating attention between 2 tasks for 30 minutes with supervision level verbal cues for redirection. SLP Short Term Goal 3 (Week 1): Patient will self-monitor and correct errors during complex tasks with supervision level verbal cues. SLP Short Term Goal 4 (Week 1): Patient will consume current diet with minimal overt s/s of aspiration with Mod I for use of swallowing compensatory strategies.  Skilled Therapeutic Interventions: Patient agreeable to skilled ST intervention with focus on cognitive goals. Patient was accompanied by her mother and aunt this date. Educated on progress with speech therapy and areas of focus including problem solving skills, attention, medication management, and swallow precautions. Patient completed BID pillbox organization with mod I and no errors. She required verbal cue to assess pillbox for accuracy in effort to improve self monitoring and error awareness. Pt displayed improved alternating and divided attention this date as she attended to her family and returned to task with mod I. Patient was left in bed with alarm activated and immediate needs within reach at end of session. Continue per current plan of care.      Pain Pain Assessment Pain Scale: 0-10 Pain Score: 0-No pain  Therapy/Group: Individual Therapy  Travanti Mcmanus T Nicolis Boody 10/22/2020, 1:15 PM

## 2020-10-22 NOTE — Progress Notes (Signed)
Occupational Therapy Session Note  Patient Details  Name: Kristen Grant MRN: [id number] Date of Birth: Feb 27, 1995  Today's Date: 10/22/2020 OT Individual Time: 5361-4431 OT Individual Time Calculation (min): 56 min    Short Term Goals: Week 1:  OT Short Term Goal 1 (Week 1): Pt will complete UB dressing with supervision for pullover shirt only, OT Short Term Goal 2 (Week 1): Pt will complete LB bathing with min guard assist sit to stand for two consecutive sessions. OT Short Term Goal 3 (Week 1): Pt will complete LB dressing with min assist for two consecutive sessions. OT Short Term Goal 4 (Week 1): Pt will complete self AAROM exercises for the LUE following handout with min guard assist.  Skilled Therapeutic Interventions/Progress Updates:    Session 1: (5400-8676) Pt in bed to start session, no report of pain during session.  She was able to transfer to the EOB on the left side with min assist overall secondary to not being able to push up from left sidelying to sit.  She then worked on donning her shoes with setup with them already tied.  She then completed min assist level transfer to the toilet without an assistive device.  Min assist as well for clothing management and toilet hygiene.  She was then able to ambulate to the sink and complete washing hands with min assist using BUEs and for oral hygiene.  She was then able to ambulate down to the therapy gym where she worked on Medical sales representative.  She was able to work in quadriped to start, using the LUE to stabilize her self while washing the mat with the RUE as well as reaching and placing clothespins.  Min to mod assist needed for stabilization to maintain left elbow extension.  Rest breaks given in tall kneeling with supervision and also sitting on the mat.  Transitioned to supine where she worked on shoulder flexion with elbow extension while holding a therapy ball.  Mod facilitation needed to maintain elbow extension with  shoulder flexion to complete movements of shoulder flexion/extension and place and hold at 90 degrees.  Finished with work on functional reach at knee level to pick up paper towels balled up with the LUE and place back and forth on the mat.  Min facilitation needed to complete multiple reaches.  Ambulated back to the room with min assist where she transferred back to the bed.  Call button and phone in reach with safety belt in place.     Session 2: [phone])  Pt completed shower and dressing during session per her request.  No report of pain during session with pt transferring from supine to sit with supervision on the left side of the bed.  She then ambulated to the shower with min guard assist and removed clothing sit to stand at the same level.  She was able to perform shower with min assist overall and mod instructional cueing to integrate the LUE for holding the soap and washcloth and for attempts to complete more bathing using the LUE.  She was able to complete with overall min assist and then transferred out to the EOB for dressing at the previous min guard assist level.  She was able to complete UB dressing at supervision and LB dressing at min assist.  Finished session with pt resting in the bed and therapist encouraging use of the LUE throughout the evening for picking up and placing her sponge on the table as well as using it as a  gross assist with holding items to be opened or holding her phone when plugging it in to be charged.  Call button and phone in reach with safety alarm in place.      Therapy Documentation Precautions:  Precautions Precautions: Fall Precaution Comments: left hemiparesis Restrictions Weight Bearing Restrictions: No  Pain: Pain Assessment Pain Scale: Faces Pain Score: 0-No pain ADL: See Care Tool Section for some details of    Therapy/Group: Individual Therapy  Maddisen Vought OTR/L 10/22/2020, 12:15 PM

## 2020-10-23 NOTE — Progress Notes (Signed)
Speech Language Pathology Daily Session Note  Patient Details  Name: Kristen Grant MRN: [id number] Date of Birth: [birth year]  Today's Date: 10/23/2020 SLP Individual Time: 1445-1530 SLP Individual Time Calculation (min): 45 min  Short Term Goals: Week 1: SLP Short Term Goal 1 (Week 1): Patient will complete complex problem solving tasks with supervision level verbal cues. SLP Short Term Goal 2 (Week 1): Patient will demonstrate alternating attention between 2 tasks for 30 minutes with supervision level verbal cues for redirection. SLP Short Term Goal 3 (Week 1): Patient will self-monitor and correct errors during complex tasks with supervision level verbal cues. SLP Short Term Goal 4 (Week 1): Patient will consume current diet with minimal overt s/s of aspiration with Mod I for use of swallowing compensatory strategies.  Skilled Therapeutic Interventions: Patient agreeable to skilled ST intervention with focus on cognitive goals. Pt appeared in good spirits and exhibited improved speech prosody. SLP re-administered the [facility] Mental Status (SLUMS) administered. Pt scored 30/30 with 27+ considered normal based on patient's educational hx. Pt displayed improvement with short-term recall of novel words, executive functions/clock drawing, and mental calculations. Pt also exhibited improved selective attention and able to complete task with minimal redirection within a mild-to-moderately distracting environments. Educated patient on results and areas of improvement. Patient would continue to benefit from skilled SLP intervention to maximize cognitive and swallowing functioning and overall functional independence prior to discharge. Patient was left in bed with alarm activated and immediate needs within reach at end of session. Continue per current plan of care.      Pain Pain Assessment Pain Scale: 0-10 Pain Score: 0-No pain  Therapy/Group: Individual Therapy  Tamala Ser 10/23/2020, 3:18 PM

## 2020-10-23 NOTE — Progress Notes (Signed)
Occupational Therapy Session Note  Patient Details  Name: Kristen Grant MRN: [id number] Date of Birth: 1995/07/18  Today's Date: 10/23/2020 OT Individual Time: 0802-0902 OT Individual Time Calculation (min): 60 min    Short Term Goals: Week 1:  OT Short Term Goal 1 (Week 1): Pt will complete UB dressing with supervision for pullover shirt only, OT Short Term Goal 2 (Week 1): Pt will complete LB bathing with min guard assist sit to stand for two consecutive sessions. OT Short Term Goal 3 (Week 1): Pt will complete LB dressing with min assist for two consecutive sessions. OT Short Term Goal 4 (Week 1): Pt will complete self AAROM exercises for the LUE following handout with min guard assist.  Skilled Therapeutic Interventions/Progress Updates:    Session 1: ([id number]) Pt with report of no pain during session.  She completed self feeding sitting on the EOB to start session and then she was able to donn her shoes with setup after therapist provided total assist for TEDs.  She was then able to complete toilet transfer at min guard assist to the toilet.  She was able to complete toilet hygiene and clothing management with min guard assist.  Next, she ambulated to the sink where she stood and completed oral hygiene as well as washing her hands.  She then ambulated at min guard and no assistive device down to the tub room for completion of step in tub transfers.  She reports that her mom has her a small shower seat for use at home as well.  Min guard assist for completion of transfer stepping over X2.  She then transitioned to working on the LUE with use of the UE ergonometer.  She was able to complete 2 sets of 2 mins using the LUE only and close supervision.  Ace bandage was used for maintaining grip as she could not maintain for more than 10 seconds.  Ambulated back to the room to finish session at min guard with pt resting in the bed with call button and phone in reach.    Session 2: (1102-1200)  No  pain reported at start of session.  Pt ambulated down to the dayroom with min guard and no device to start session.  Worked on LUE strengthening throughout session beginning in quadriped.  She worked on maintaining left elbow extension while lifting the RUE off of the mat.  After resting she progressed to working on walking on hands and knees on the mat as well as transitions from knees to prone and back.  Min assist needed for LUE support at times with transition back to quadriped from prone.  Transitioned to sitting where she focused on functional reach with the LUE to pick up 2' X 4" foam blocks.  Min facilitation needed to avoid trunk compensation when reaching just below chest level and placing them back in the container.  Also had her work on picking up large wooden pegs with the LUE, placed beside of her on the mat and then placing in the peg  board placed at knee level. Increased time needed to complete with decreased three jaw chuck grasp but no dropping of pegs.  Finished session with return to the room at min guard and no device.  Pt left in bed with call button and phone in reach.  Bed alarm in place.    Session 3: (1335-1419)  Pt in bed to start with no report of pain when asked.  She was able to transfer to the  EOB with min assist rolling to the left side and working on pushing up with the LUE.  She donned shoes with setup and then completed toilet transfer with min guard.  Min guard as well for clothing management with supervision for hygiene in sitting.  She ambulated to the sink and washed her hands with min guard incorporating the LUE as well for reaching to get the soap and for washing her hands.  She then ambulated down to the therapy gym with min guard and no device.  Had her work on head turns right to left during mobility, with occasional LOB, however she was able to regain it on her own.  Had her work on LUE strengthening in standing while completing washing of the counter surface in the  gym as well as the cabinets.  Supervision for washing the counter with min instructional cueing to maintain digit extension in the left hand throughout the movement.  She needed min facilitation to wash the upper cabinets in order to extend the elbow and flex the shoulder overhead.  Progressed to working on walking while having to use BUEs to hold a board with a cone balanced on it.  Increased slant to the left noted with the board, requiring occasional min assist to correct.  She was able to ambulate with supervision from the gym to the dayroom and back.  Finished with work on using the [employer].  Increased difficulty noted holding onto the handles as well as for abducting the left arm.  Returned to the room with transfer to the bed to rest.  Call button and phone in reach.      Therapy Documentation Precautions:  Precautions Precautions: Fall Precaution Comments: left hemiparesis Restrictions Weight Bearing Restrictions: No  Pain: Pain Assessment Pain Scale: Faces Pain Score: 0-No pain ADL: See Care Tool Section for some details of mobility and selfcare   Therapy/Group: Individual Therapy  Mariel Gaudin OTR/L 10/23/2020, 12:45 PM

## 2020-10-23 NOTE — Progress Notes (Signed)
PROGRESS NOTE   Subjective/Complaints:  Working with OT on feeding, able to do gross grasp with LUE, denies sensation loss on Left side  ROS: Denies CP, SOB, N/V/D  Objective:   No results found. Recent Labs    10/21/20 0805  WBC 8.4  HGB 11.6*  HCT 34.9*  PLT 328    No results for input(s): NA, K, CL, CO2, GLUCOSE, BUN, CREATININE, CALCIUM in the last 72 hours.   Intake/Output Summary (Last 24 hours) at 10/23/2020 0812 Last data filed at 10/22/2020 1848 Gross per 24 hour  Intake 320 ml  Output --  Net 320 ml         Physical Exam: Vital Signs Blood pressure (!) 103/58, pulse 66, temperature 98.5 F (36.9 C), temperature source Oral, resp. rate 18, height _0  (1.753 m), weight 88.1 kg, SpO2 100 %.  General: No acute distress Mood and affect are appropriate Heart: Regular rate and rhythm no rubs murmurs or extra sounds Lungs: Clear to auscultation, breathing unlabored, no rales or wheezes Abdomen: Positive bowel sounds, soft nontender to palpation, nondistended Extremities: No clubbing, cyanosis, or edema Skin: No evidence of breakdown, no evidence of rash  Motor: Right upper extremity/right lower extremity: 5/5 proximal distal Left lower extremity: 4/5 proximal distal Left upper extremity: 3/5 delt bi tri finger flex/ext  Assessment/Plan: 1. Functional deficits which require 3+ hours per day of interdisciplinary therapy in a comprehensive inpatient rehab setting. Physiatrist is providing close team supervision and 24 hour management of active medical problems listed below. Physiatrist and rehab team continue to assess barriers to discharge/monitor patient progress toward functional and medical goals  Care Tool:  Bathing    Body parts bathed by patient: Left arm, Chest, Abdomen, Front perineal area, Buttocks, Right upper leg, Left upper leg, Right lower leg, Left lower leg, Face   Body parts bathed  by helper: Right arm     Bathing assist Assist Level: Contact Guard/Touching assist     Upper Body Dressing/Undressing Upper body dressing   What is the patient wearing?: Pull over shirt    Upper body assist Assist Level: Supervision/Verbal cueing    Lower Body Dressing/Undressing Lower body dressing      What is the patient wearing?: Pants, Underwear/pull up     Lower body assist Assist for lower body dressing: Minimal Assistance - Patient > 75%     Toileting Toileting    Toileting assist Assist for toileting: Minimal Assistance - Patient > 75%     Transfers Chair/bed transfer  Transfers assist     Chair/bed transfer assist level: Minimal Assistance - Patient > 75%     Locomotion Ambulation   Ambulation assist      Assist level: Minimal Assistance - Patient > 75% Assistive device: No Device Max distance: 150'   Walk 10 feet activity   Assist     Assist level: Moderate Assistance - Patient - 50 - 74% Assistive device: Hand held assist   Walk 50 feet activity   Assist Walk 50 feet with 2 turns activity did not occur: Safety/medical concerns         Walk 150 feet activity   Assist Walk 150  feet activity did not occur: Safety/medical concerns         Walk 10 feet on uneven surface  activity   Assist Walk 10 feet on uneven surfaces activity did not occur: Safety/medical concerns         Wheelchair     Assist Is the patient using a wheelchair?: No             Wheelchair 50 feet with 2 turns activity    Assist            Wheelchair 150 feet activity     Assist          Blood pressure (!) 103/58, pulse 66, temperature 98.5 F (36.9 C), temperature source Oral, resp. rate 18, height _0  (1.753 m), weight 88.1 kg, SpO2 100 %.  Medical Problem List and Plan: 1.  Left-sided hemiparesis with dysarthria secondary to right MCA right MCA territory infarct predominantly right caudate putamen anterior limb  right internal capsule and overlying right frontal lobe secondary to right M1 occlusion status post revascularization mechanical thrombectomy 10/11/2020 as well as requiring repeat thrombectomy 10/12/2020 for occluded dominant superior division of right MCA. CVA of cryptogenic origin. TEE really unremarkable. ? Small PFO  Continue CIR  Team conference today please see physician documentation under team conference tab, met with team  to discuss problems,progress, and goals. Formulized individual treatment plan based on medical history, underlying problem and comorbidities.   PRAFO/WHO ordered  Baclofen 5 nightly started on 9/17- has tight finger flexors  2.  Antithrombotics: -DVT/anticoagulation:  Mechanical: Sequential compression devices, below knee Bilateral lower extremities             -antiplatelet therapy: Aspirin 81 mg daily 3. Pain Management: Tylenol as needed 4. Mood/anxiety : Team to Provide emotional support             -antipsychotic agents: N/A             -added low dose xanax for anxiety 5. Neuropsych: This patient is capable of making decisions on her own behalf. 6. Skin/Wound Care: Routine skin checks 7. Fluids/Electrolytes/Nutrition: Routine in and outs  8.  Hyperlipidemia: Lipitor 9.  Urine drug screen positive marijuana.  Counseling 10.  Obesity.  BMI 29.92.  Dietary follow-up  11. Constipation order prn laxative 12.  PFO looks smaller on TEE than on TTE, unlikely to require PFO closure, will f/u with neuro as OP after inpt rehab  14.  Leukocytosis- resolved   WBCs 12.5 on 9/16, now normal at 8.6K 15.  Anemia , normocytic, pt denies having menstrual period    LOS: 6 days A FACE TO FACE EVALUATION WAS PERFORMED  Charlett Blake 10/23/2020, 8:12 AM

## 2020-10-23 NOTE — Progress Notes (Signed)
Patient ID: Kristen Grant, female   DOB: May 08, 1995, 25 y.o.   MRN: [id number] Team Conference Report to Patient/Family  Team Conference discussion was reviewed with the patient and caregiver, including goals, any changes in plan of care and target discharge date.  Patient and caregiver express understanding and are in agreement.  The patient has a target discharge date of 10/26/20.  Sw met with patient and called pt mother at bedside. Provided team conference updates. Pt mother will be present for family education on Friday, September 24,2022 8-10 AM.   Dyanne Iha 10/23/2020, 2:26 PM

## 2020-10-23 NOTE — Patient Care Conference (Signed)
Inpatient RehabilitationTeam Conference and Plan of Care Update Date: 10/23/2020   Time: 10:40 AM    Patient Name: Kristen Grant      Medical Record Number: [id number]  Date of Birth: [birth year] Sex: Female         Room/Bed: 4M09C/4M09C-01 Payor Info: Payor: [employer] / Plan: CIGNA MANAGED / Product Type: *No Product type* /    Admit Date/Time:  10/17/2020  4:33 PM  Primary Diagnosis:  <principal problem not specified>  Hospital Problems: Active Problems:   Right middle cerebral artery stroke (HCC)   Hemiparesis affecting left side as late effect of stroke (HCC)   Leukocytosis   Dyslipidemia   Spastic hemiparesis [facility])    Expected Discharge Date: Expected Discharge Date: 10/26/20  Team Members Present: Physician leading conference: Dr. Claudette Laws Social Worker Present: Lavera Guise, BSW Nurse Present: Chana Bode, RN PT Present: Midge Minium, PT OT Present: Perrin Maltese, OT SLP Present: Eilene Ghazi, SLP PPS Coordinator present : Fae Pippin, SLP     Current Status/Progress Goal Weekly Team Focus  Bowel/Bladder   Continent of Bladder and Bowel.  Maintain Regular function.      Swallow/Nutrition/ Hydration   regular textures, thin liquids - minimizing mixed consistencies         ADL's   Supervision for UB selfcare with min assist for LB selfcare.  Min guard to min assist for transfers and mobility without an assistive device.  LUE continues to be limited at [facility] stage IV -V movement, using currently at a gross assist level with supervision.  Supervision overall  selfcare retraining, transfer training, neuromuscular re-education, balance retraining, therapeutic exercises. pt education   Mobility   supervision bed mobility and transfers, ambulating 400 ft without an AD  Mod I overall  Strengthening, balance, neuro-reed, transfers.   Communication             Safety/Cognition/ Behavioral Observations  sup-to-mod I  mod I  complex problem solving,  medication and money management, alternating attention   Pain   Denies Pain.  Will remain pain free.      Skin   No skin impairment.  Maintain skin integrity.        Discharge Planning:  Pt discharging home with mother with 24/7 assistance   Team Discussion: Cortical stroke/Right MCA with revascularization/mechanical thrombectomy, coag work up completed and small PFO however neither deemed cause for stroke. Smoker and taking birth control.  Education on smoking effects on health and alternative birth control measures rendered.  Patient on target to meet rehab goals: yes, currently supervision for upper body care and supervision - min assist for lower body care. Needs little help with putting on clothes on left side. Transfers with min guard assist without and assistive device and no significant loss of balance.   *See Care Plan and progress notes for long and short-term goals.   Revisions to Treatment Plan:  Working on  high level cognitive impairments, complex cognitive tasks, money and medication management and alternating attention. Teaching Needs: Safety, transfers, medications, secondary risk management, dietary modifications, etc.  Current Barriers to Discharge: Decreased caregiver support and Home enviroment access/layout  Possible Resolutions to Barriers: Mother quit work to provide care Family education OP follow up services at [facility] Summary Current Status: BP controlled, cont bowel and bladder  Barriers to Discharge: Medical stability;Other (comments)   Possible Resolutions to [employer] Focus: improve safety , teach risk factor for CVA, coordinate outpt care   Continued Need  for Acute Rehabilitation Level of Care: The patient requires daily medical management by a physician with specialized training in physical medicine and rehabilitation for the following reasons: Direction of a multidisciplinary physical rehabilitation program to maximize  functional independence : Yes Medical management of patient stability for increased activity during participation in an intensive rehabilitation regime.: Yes Analysis of laboratory values and/or radiology reports with any subsequent need for medication adjustment and/or medical intervention. : Yes   I attest that I was present, lead the team conference, and concur with the assessment and plan of the team.   Chana Bode B 10/23/2020, 2:42 PM

## 2020-10-24 ENCOUNTER — Other Ambulatory Visit (HOSPITAL_COMMUNITY): Payer: Self-pay

## 2020-10-24 MED ORDER — DOCUSATE SODIUM 100 MG PO CAPS: 100.0000 mg | ORAL_CAPSULE | Freq: Two times a day (BID) | ORAL | 0 refills | Status: DC

## 2020-10-24 MED ORDER — BACLOFEN 5 MG PO TABS
5.0000 mg | ORAL_TABLET | Freq: Every day | ORAL | 0 refills | Status: DC
  Filled 2020-10-24: qty 30, 30d supply, fill #0

## 2020-10-24 MED ORDER — ATORVASTATIN CALCIUM 80 MG PO TABS
80.0000 mg | ORAL_TABLET | Freq: Every day | ORAL | 0 refills | Status: DC
  Filled 2020-10-24: qty 30, 30d supply, fill #0

## 2020-10-24 MED ORDER — POLYETHYLENE GLYCOL 3350 17 G PO PACK: 17.0000 g | PACK | Freq: Every day | ORAL | 0 refills | Status: DC

## 2020-10-24 MED ORDER — ACETAMINOPHEN 325 MG PO TABS: 650.0000 mg | ORAL_TABLET | ORAL | Status: DC | PRN

## 2020-10-24 MED ORDER — ASPIRIN 81 MG PO TBEC
81.0000 mg | DELAYED_RELEASE_TABLET | Freq: Every day | ORAL | 0 refills | Status: AC
  Filled 2020-10-24: qty 30, 30d supply, fill #0

## 2020-10-24 NOTE — Progress Notes (Signed)
Speech Language Pathology Discharge Summary  Patient Details  Name: Kristen Grant MRN: [id number] Date of Birth: 08-03-1995  Today's Date: 10/25/2020 SLP Individual Time: 1000-1030 SLP Individual Time Calculation (min): 30 min  Skilled Therapeutic Interventions: Pt greeted semi-reclined in bed and agreeable to skilled ST intervention with focus on family education in preparation of discharge tomorrow. SLP educated pt's mother/caregiver on progress with therapy, results of recent SLUMS re-assessment, cognitive strategies for attention, current swallow function and discharge diet recommendations including regular textures, thin liquids, pills whole with water or puree as tolerated. Pt and her mother returned education through teach back and had no further questions at this time. Both very pleased with pt's progress and mother endorsed she feels patient is at her baseline cognitively. Patient was left in bed with alarm activated and immediate needs within reach at end of session.   Patient has met 4 of 4 long term goals.  Patient to discharge at overall Modified Independent level.  Reasons goals not met: NA; all goals met   Clinical Impression/Discharge Summary: Patient has made excellent gains and has met 4 of 4 long-term goals this admission due to improved higher level cognitive skills including complex problem solving, attention, and awareness. She is currently a overall mod I for cognitive tasks. Progress also supported by 5 point improvement on SLUMS assessment as evidenced by patient scoring 30/30 as of 9/21 as compared to 25/30 on 9/16. Pt continues to exhibit decreased alternating attention and known to distract to external stimuli such as her phone and television. Improvement noted with pragmatics including speech prosody, eye contact, and overall affect. Patient is currently consuming a regular texture diet and thin liquids without overt s/sx of aspiration and implements safe swallow precautions  and strategies with mod I including upright positioning before and after meals and small bites/sips. Pt exhibiting minimal-to-no cough response with mixed consistencies or with medications when taken whole with water. Patient and family education is complete and patient to discharge at overall modified independent level. Patient's care partner is independent to provide the necessary physical and cognitive assistance at discharge. Considering improvements made and patient presenting WFL for tasks assessed from a cognitive-linguistic standpoint, do not recommend additional SLP services at this time.   Care Partner:  Caregiver Able to Provide Assistance: Yes  Type of Caregiver Assistance: Physical;Cognitive  Recommendation:  None     Equipment: NA   Reasons for discharge: Treatment goals met;Discharged from hospital   Patient/Family Agrees with Progress Made and Goals Achieved: Yes    Missy Baksh T Buford Gayler 10/24/2020, 3:52 PM

## 2020-10-24 NOTE — Progress Notes (Signed)
Physical Therapy Session Note  Patient Details  Name: Kristen Grant MRN: [id number] Date of Birth: 03-30-1995  Today's Date: 10/24/2020 PT Individual Time: 0820-0900 PT Individual Time Calculation (min): 40 min   Short Term Goals: Week 1:  PT Short Term Goal 1 (Week 1): Patient will ambulate at least 44f with MinA and LRAD PT Short Term Goal 2 (Week 1): Patient will transfer bed <> wc with LRAD and MinA consistently PT Short Term Goal 3 (Week 1): Patient will score >12/56 on the BBS  Skilled Therapeutic Interventions/Progress Updates: Pt presented in bed agreeable to therapy. Pt denies pain throughout session. Performed bed mobility with supervision and use of bed features. Pt ambulated to rehab gym without AD and supervision demonstrating consistent heel strike and fair L foot clearance. PTA obtained ball and pt continued ambulation to day room catching and tossing ball for dual task and forced use of LUE. In day room pt performed same task ambulating backwards with noted increased L toe drag. Pt then participated in several rounds of Wii tennis using LUE. Pt then participated in ambulating around day room picking up washcloths then folding then at high/low table incorporating L hand. Pt was then able to carry washcloths horizontally with bilateral hands to soiled linen bag. Pt then ambulated back to room and remained seated at EOB with call bell within reach and needs met.      Therapy Documentation Precautions:  Precautions Precautions: Fall Precaution Comments: left hemiparesis Restrictions Weight Bearing Restrictions: No General:   Vital Signs:  Pain:   Mobility:   Locomotion :    Trunk/Postural Assessment :    Balance:   Exercises:   Other Treatments:      Therapy/Group: Individual Therapy  Renay Crammer 10/24/2020, 2:29 PM

## 2020-10-24 NOTE — Progress Notes (Signed)
Patient ID: Kristen Grant, female   DOB: Nov 12, 1995, 25 y.o.   MRN: [id number]  Pr referral faxed to [place] [facility]  Lavera Guise, [place] [phone]

## 2020-10-24 NOTE — Progress Notes (Signed)
Speech Language Pathology Daily Session Note  Patient Details  Name: Kristen Grant MRN: [id number] Date of Birth: May 14, 1995  Today's Date: 10/24/2020 SLP Individual Time: 1300-1330 SLP Individual Time Calculation (min): 30 min  Short Term Goals: Week 1: SLP Short Term Goal 1 (Week 1): Patient will complete complex problem solving tasks with supervision level verbal cues. SLP Short Term Goal 2 (Week 1): Patient will demonstrate alternating attention between 2 tasks for 30 minutes with supervision level verbal cues for redirection. SLP Short Term Goal 3 (Week 1): Patient will self-monitor and correct errors during complex tasks with supervision level verbal cues. SLP Short Term Goal 4 (Week 1): Patient will consume current diet with minimal overt s/s of aspiration with Mod I for use of swallowing compensatory strategies.  Skilled Therapeutic Interventions: Pt greeted semi-reclined in bed and agreeable to skilled ST intervention with focus on cognitive and swallow goals. Pt consumed regular consistency snack and implemented swallowing compensatory strategies with mod I including elevating HOB, small bites/sips, and remaining upright following meal. No overt s/sx of aspiration observed with regular solids and sips of thin liquid via straw. Pt completed complex deductive reasoning task with mod I. She exhibited self monitoring and ability to self-correct errors with independence. Patient was left in bed with alarm activated and immediate needs within reach at end of session. Continue per current plan of care to address family education prior to discharge.    Pain Pain Assessment Pain Scale: 0-10 Pain Score: 0-No pain  Therapy/Group: Individual Therapy  Tamala Ser 10/24/2020, 3:44 PM

## 2020-10-24 NOTE — Progress Notes (Signed)
Physical Therapy Session Note  Patient Details  Name: Kristen Grant MRN: [id number] Date of Birth: [birth year]  Today's Date: 10/24/2020 PT Individual Time: 1122-1200 PT Individual Time Calculation (min): 38 min   Short Term Goals: Week 1:  PT Short Term Goal 1 (Week 1): Patient will ambulate at least 77f with MinA and LRAD PT Short Term Goal 2 (Week 1): Patient will transfer bed <> wc with LRAD and MinA consistently PT Short Term Goal 3 (Week 1): Patient will score >12/56 on the BBS   Skilled Therapeutic Interventions/Progress Updates:   Pt received supine in bed and agreeable to PT. Supine>sit transfer without assist or cues. Pt able to don shoes sitting EOB wit supervision assist. [facility] Pt transported to entrance of [employer] Gait training over unlevel cement sidewalk 208f and 2 x [id number] cues for improved step height on the RLE. Pt then performed gait training through gift shop without UE support x [id number] supervision assist for safety. Pt returned to room and performed stand pivot transfer to bed with supervision assist . Sit>supine completed without assist, and left supine in bed with call bell in reach and all needs met.        Therapy Documentation Precautions:  Precautions Precautions: Fall Precaution Comments: left hemiparesis Restrictions Weight Bearing Restrictions: No   Pain: Pain Assessment Pain Scale: Faces Pain Score: 0-No pain    Therapy/Group: Individual Therapy  AustLorie Phenix2/2022, 12:13 PM

## 2020-10-24 NOTE — Progress Notes (Addendum)
Occupational Therapy Session Note  Patient Details  Name: Kristen Grant MRN: [id number] Date of Birth: 02/17/1995  Today's Date: 10/24/2020 OT Individual Time: 0908-1001 OT Individual Time Calculation (min): 53 min    Short Term Goals: Week 1:  OT Short Term Goal 1 (Week 1): Pt will complete UB dressing with supervision for pullover shirt only, OT Short Term Goal 2 (Week 1): Pt will complete LB bathing with min guard assist sit to stand for two consecutive sessions. OT Short Term Goal 3 (Week 1): Pt will complete LB dressing with min assist for two consecutive sessions. OT Short Term Goal 4 (Week 1): Pt will complete self AAROM exercises for the LUE following handout with min guard assist.  Skilled Therapeutic Interventions/Progress Updates:    Session 1: (5573-2202) Pt with no report of pain during session with focus on bathing and dressing during session.  Mod instructional cueing to incorporate LUE functional use throughout session as a gross assist for holding the hand held shower and holding deodorant when opening and applying with overall min assist.  She completed all bathing with supervision as she could use the LUE to wash the right arm and shoulder with increased time.  Dressing was completed sit to stand at supervision level as well except for TEDs, which were max assist.  Had her complete oral hygiene at the sink with supervision as well, primarily using the RUE for brushing but the LUE was used for stabilizing toothbrush while placing toothpaste on it.  Finished session with the call button and phone in reach as well as safety alarm in place.    Session 2: (5427-0623)  Pt in bed to start with focus of session on LUE strengthening and AROM.  Had pt complete 3 sets of 5 repetitions in supine for shoulder flexion using the LUE only.  Slight increased elbow flexion noted when attempting ROM as well as decreased ability to maintain neutral wrist position with digit extension.  Educated pt  to use the RUE for self AAROM as needed but to take rest breaks if she noticed her technique fading.  Also had her work on strengthening of the triceps with use of the RUE to stabilize the left arm at 90 degrees elbow flexion and complete elbow extension exercises for 2 sets of 10 reps.  She then transitioned to sitting EOB with supervision where she completed elbow flexion for 10 reps AROM as well as left weightshifts with the LUE placed on the bed with dycem under it to keep from sliding.  She was instructed to complete small left leans onto the hand and then work on extending her arm to push back up to midline.  Had her progress to functional reaching tasks  where she was having to reach up on the bedside table and pick up medicine cups one at a time and stack beside of her on the bed.  Min self assist was needed with the RUE to keep from compensating with her trunk and hiking the shoulder.  Decreased the height she was reaching by placing them in a chair at knee level, but still she needed min self assist to reach them without trunk compensation.  Transitioned back to supine where she completed external stretch of the LUE with hands placed behind her head.  Finished with call button and phone in reach and safety alarm in place.    Therapy Documentation Precautions:  Precautions Precautions: Fall Precaution Comments: left hemiparesis Restrictions Weight Bearing Restrictions: No  Pain: Pain  Assessment Pain Scale: Faces Pain Score: 0-No pain ADL: See Care Tool Section for some details of mobility and selfcare   Therapy/Group: Individual Therapy  Sharlynn Seckinger OTR/L 10/24/2020, 10:21 AM

## 2020-10-24 NOTE — Discharge Summary (Signed)
Physician Discharge Summary  Patient ID: Kristen Grant MRN: [id number] DOB/AGE: [birth year] 25 y.o.  Admit date: 10/17/2020 Discharge date: 10/26/2020  Discharge Diagnoses:  Active Problems:   Right middle cerebral artery stroke (HCC)   Hemiparesis affecting left side as late effect of stroke (HCC)   Leukocytosis   Dyslipidemia   Spastic hemiparesis (HCC) Obesity Constipation Anemia Mood stabilization Urine drug screen positive for marijuana  Discharged Condition: Stable  Significant Diagnostic Studies: CT HEAD WO CONTRAST ( )  Result Date: 10/12/2020 CLINICAL DATA:  Follow-up examination for acute stroke, status post thrombectomy. EXAM: CT HEAD WITHOUT CONTRAST TECHNIQUE: Contiguous axial images were obtained from the base of the skull through the vertex without intravenous contrast. COMPARISON:  Prior CTA from earlier the same day. FINDINGS: Brain: Loss of gray-white matter differentiation involving the right insular region as well as the overlying right frontal operculum. Fogging at the right caudate and lentiform nuclei present as well. Findings consistent with evolving right MCA distribution infarct. Superimposed hyperdensity at the level of the right sylvian fissure could reflect contrast staining or possibly small amount of subarachnoid hemorrhage (series 2, image 20). No significant mass effect at this time. Remainder the brain is otherwise normal in appearance. No other hemorrhage or acute large vessel territory infarct. No mass lesion or midline shift. No hydrocephalus. No extra-axial fluid collection. Vascular: No visible hyperdense vessel. Skull: Scalp soft tissues and calvarium demonstrate no new abnormality. Sinuses/Orbits: Globes and orbital soft tissues within normal limits. Scattered mucosal thickening in opacity present within the sphenoid ethmoidal and maxillary sinuses with a few scattered retention cyst. Mastoid air cells are clear. Other: None. IMPRESSION: 1. Evolving  acute right MCA distribution infarct. Superimposed hyperdensity at the level of the right Sylvian fissure could reflect contrast staining or possibly small amount of subarachnoid hemorrhage. Short interval follow-up CT suggested. No significant mass effect at this time. 2. No other new acute intracranial abnormality. These results were communicated to Dr. Wilford Corner at 8:55 pm on 10/12/2020 by text page via the [facility] messaging system. Electronically Signed   By: Rise Mu M.D.   On: 10/12/2020 20:56   MR BRAIN WO CONTRAST  Result Date: 10/13/2020 CLINICAL DATA:  Stroke, follow up EXAM: MRI HEAD WITHOUT CONTRAST TECHNIQUE: Multiplanar, multiecho pulse sequences of the brain and surrounding structures were obtained without intravenous contrast. COMPARISON:  CT head 10/12/2020. FINDINGS: Brain: Acute infarct involving the anterior right MCA territory, predominantly including the caudate, putamen, anterior limb of the right internal capsule, insula, operculum and overlying right frontal lobe. A few punctate acute infarcts in the right parietal lobe. Associated cytotoxic edema with slight (2-3 mm) leftward midline shift at the foramen of Monro. Small amount of susceptibility artifact in the posterior right frontal lobe/insular region, suggestive of small volume hemorrhage. No hydrocephalus. No extra-axial fluid collection. Vascular: Major arterial flow voids are maintained at the skull base. Skull and upper cervical spine: Normal marrow signal. Sinuses/Orbits: Right sphenoid sinus retention cyst. Otherwise, largely clear sinuses. Unremarkable orbits. Other: No mastoid effusions. IMPRESSION: 1. Acute anterior right MCA territory infarct, predominantly involving the right caudate, putamen, anterior limb of the right internal capsule, insula, operculum and overlying right frontal lobe. A few punctate acute infarcts in the right parietal lobe. 2. Associated cytotoxic edema with slight (2-3 mm) leftward midline shift  at the foramen of Monro. 3. Probable small amount of hemorrhage in the posterior right frontal lobe/insular region. Electronically Signed   By: Feliberto Harts M.D.   On: 10/13/2020 14:38  IR CT Head Ltd  Result Date: 10/21/2020 INDICATION: Recurrent left-sided hemiplegia, right gaze deviation and dysarthria. 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 EXAM: 1. EMERGENT LARGE VESSEL OCCLUSION THROMBOLYSIS (anterior CIRCULATION) COMPARISON:  CT angiogram of the head and neck of 10/12/2020. MEDICATIONS: Ancef 2 g IV antibiotic was administered within 1 hour of the procedure. ANESTHESIA/SEDATION: General anesthesia. CONTRAST:  Omnipaque 350 approximately 100 mL. FLUOROSCOPY TIME:  Fluoroscopy Time: 43 minutes (2041 mGy). COMPLICATIONS: None immediate. TECHNIQUE: Following a full explanation of the procedure along with the potential associated complications, an informed witnessed consent was obtained from the mother. The risks of intracranial hemorrhage of 10%, worsening neurological deficit, ventilator dependency, death and inability to revascularize were all reviewed in detail with the patient's mother. The patient was then put under general anesthesia by the Department of Anesthesiology at [facility]. The right groin was prepped and draped in the usual sterile fashion. Thereafter using modified Seldinger technique, transfemoral access into the right common femoral artery was obtained without difficulty. Over a 0.035 inch guidewire an 8 French 25 cm Pinnacle sheath was inserted. Through this, and also over a 0.035 inch guidewire a combination of a 5 [place] JB 1 catheter inside of an 087 balloon guide catheter which had been prepped with 50% contrast and 50% heparinized saline infusion was advanced to the aortic arch region and eventually the right common carotid artery. The JB 1 catheter, and the wire were removed. Good aspiration obtained from  the hub of the balloon guide catheter. FINDINGS: A gentle control arteriogram performed through balloon guide catheter demonstrates the right external carotid artery and its major branches to be widely patent. The right internal carotid artery at the bulb to the cranial skull base demonstrates wide patency. The petrous, cavernous and supraclinoid segments demonstrate wide patency. A stump of an occluded right middle cerebral superior division was seen. The inferior division opacified into the capillary and venous phases. The right anterior cerebral artery and the right posterior communicating artery demonstrate wide patency into the capillary and venous phases. PROCEDURE: Over a 0.014 inch standard Synchro micro guidewire with a J configuration, an 021 160 cm microcatheter inside of a 50 132 cm Zoom aspiration catheter was advanced to the supraclinoid right ICA. The micro guidewire was then gently manipulated and advanced through the occluded superior division into the M2 M3 region followed by the microcatheter. The guidewire was removed. Good aspiration obtained from the hub of the microcatheter. A gentle control arteriogram performed through the microcatheter demonstrated safe position of the tip of the microcatheter which was then connected to continuous heparinized saline infusion. A 4 mm x 40 mm Solitaire X retrieval device was then advanced to the distal end of the micro catheter. The retrieval device was then deployed in the usual manner by retrieving the microcatheter. The proximal portion of the retrieval device was noted just proximal to the origin of the superior division. Thereafter, with constant aspiration being applied through the Zoom aspiration catheter using a Penumbra aspiration device, now advanced into the proximal superior division, with proximal flow arrest and aspiration with a 20 mL syringe at the hub of the balloon guide catheter for approximately 3 minutes, the combination of the retrieval  device, the microcatheter and the Zoom aspiration catheter were retrieved and removed. Upon reversal of flow arrest in the right ICA, a control arteriogram performed through the balloon guide in the right internal carotid artery demonstrated revascularization  of the superior division with a tiny branch supplying the anterior 2 branches of the perisylvian branch noted to be occluded. Over an 0.014 inch standard Synchro micro guidewire with an 021 160 cm microcatheter, access into the small branch of the superior division were unsuccessful. Multiple attempts were then made using an 035 inch Zoom aspiration catheter with initially an 021 Synchro standard micro guidewire, and then an Aristotle 014 inch micro guidewire without success. A control arteriogram performed through the balloon guide catheter in the right internal carotid artery demonstrated a 2B to 2C revascularization of the right middle cerebral artery territory. No further attempts were then given this made to catheterize the small perisylvian branch arising from the superior division given that this was essentially a core infarct seen on the perfusion images. The balloon guide catheter was then retrieved into the right common carotid artery, and a control arteriogram performed demonstrated wide patency of the right internal carotid artery extra cranially and intracranially with no change in the intracranial circulation. An 8 French Pinnacle sheath was then replaced with an 8 French Angio-Seal closure device for hemostasis. The right groin appeared soft. Distal pulses remained palpable in both feet unchanged. A CT of the brain demonstrated no evidence of intracranial hemorrhage. Minimal mass effect was appreciated in the right lateral ventricle region. Patient was then extubated without difficulty. Upon recovery, the patient was able to respond to simple commands appropriately. She was oriented to place. Left facial droop was appreciated. The tongue was in  the midline. Patient is able to bend her left knee, and able to lift her distal left upper extremity against gravity. Patient was then transferred to the ICU for post revascularization care. 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 PLAN: Follow-up in the clinic 4 weeks post discharge. Electronically Signed   By: Julieanne Cotton M.D.   On: 10/14/2020 13:28   IR CT Head Ltd  Result Date: 10/21/2020 INDICATION: New onset right gaze deviation, left side hemiplegia, and left-sided neglect. Occluded right middle cerebral artery M1 segment on CT angio of the head and neck. EXAM: 1. EMERGENT LARGE VESSEL OCCLUSION THROMBOLYSIS (anterior CIRCULATION) COMPARISON:  CT angiogram of the head and neck of 10/11/2020. MEDICATIONS: Ancef 2 g IV antibiotic was administered within 1 hour of the procedure. ANESTHESIA/SEDATION: General anesthesia. CONTRAST:  Omnipaque 300 approximately 100 mL FLUOROSCOPY TIME:  Fluoroscopy Time: 39 minutes 12 seconds (1687 mGy). COMPLICATIONS: None immediate. TECHNIQUE: Following a full explanation of the procedure along with the potential associated complications, an informed witnessed consent was obtained. The risks of intracranial hemorrhage of 10%, worsening neurological deficit, ventilator dependency, death and inability to revascularize were all reviewed in detail with the patient's mother. The patient was then put under general anesthesia by the Department of Anesthesiology at [facility]. The right groin was prepped and draped in the usual sterile fashion. Thereafter using modified Seldinger technique, transfemoral access into the right common femoral artery was obtained without difficulty. Over a 0.035 inch guidewire an 8 [place] Pinnacle 25 cm sheath was inserted. Through  this, and also over a 0.035 inch guidewire a 5 [place] JB 1 catheter was advanced to the aortic arch region and selectively positioned in the right common carotid artery. FINDINGS: Right common carotid arteriogram demonstrates the right external carotid artery and its major  branches to be widely patent. The right internal carotid artery at the bulb to the cranial skull base is widely patent. The petrous, cavernous and the supraclinoid segments are widely patent. The right posterior communicating artery is seen opacifying the right posterior cerebral distribution. The right middle cerebral artery demonstrates complete occlusion just distal to the origin of the anterior temporal branch. The right anterior cerebral artery opacifies into the capillary and venous phases with delayed retrograde opacification of the middle perisylvian branches of the superior division via the right anterior cerebral artery pial collaterals. PROCEDURE: The diagnostic catheter in the right common carotid artery was then exchanged over a 0.035 inch 300 cm Rosen exchange guidewire for a 90 cm 087 balloon guide catheter which had been prepped with 50% contrast and 50% heparinized saline infusion. The balloon guide catheter was then advanced to the mid right ICA over the exchange micro guidewire without difficulty. The guidewire was removed. Good aspiration was obtained from the hub of the balloon guide catheter in the mid right ICA. Gentle control arteriogram performed through this demonstrated no evidence of spasms, dissections or of intraluminal filling defects. An intracranial examination revealed no change in the occluded right middle cerebral artery. Using an 0.014 inch standard Synchro micro guidewire with a J-tip configuration, a combination of an 014 inch 160 cm microcatheter inside of an 071 132 cm Zoom aspiration catheter was advanced to the supraclinoid right ICA. The micro guidewire was then gently manipulated to advance to the  proximal portion of the occluded right middle cerebral artery. The micro guidewire, and the microcatheter were gently retrieved as the 071 Zoom aspiration catheter was advanced into the occluded right middle cerebral artery. Constant aspiration was then applied into the Zoom aspiration catheter with aspiration device for approximately 3 minutes. During this gentle manipulation was also performed to advance the Zoom aspiration catheter. With proximal flow arrest in the right internal carotid artery, Zoom aspiration catheter were retrieved and removed. Following flow reversal in the right internal carotid artery, an arteriogram performed through the balloon guide in the right internal carotid artery now demonstrated revascularization of the right middle cerebral artery and superior divisions with occlusion of a superior division branch in the distal M2 region. A TICI 2C revascularization was achieved. Extensive vasospasm was also noted in the right middle cerebral artery M1 segment and the origin of the superior division, which responded to 5 25 mcg aliquots of nitroglycerin intra-arterially, and of 2.5 mg of verapamil IA. Over a 0.14 inch standard Synchro micro guidewire, inside of an 021 160 cm microcatheter and 55 cm 132 Zoom aspiration catheter combination was then advanced to supraclinoid right ICA. The micro guidewire was then gently manipulated with a torque device and advanced through the occluded superior division branch into the distal M2 region followed by the microcatheter. The guidewire was removed. Free aspiration of blood was noted at the hub of the microcatheter. A 3 mm x 40 mm Solitaire X retrieval device was then advanced to the distal end of the microcatheter and deployed in the usual manner. The Zoom aspiration catheter was advanced into the origin of the superior division. With proximal flow arrest in the right internal carotid artery, constant aspiration was now applied at the hub of the balloon  guide catheter with a 20 mL syringe, and aspiration device at the hub of the Zoom aspiration catheter for approximately 3 minutes. Thereafter, the combination of the retrieval device the microcatheter and Zoom aspiration catheter were retrieved and removed. Following  reversal of flow arrest, a control arteriogram performed through the balloon guide in the right internal carotid artery now demonstrated complete revascularization of the previously occluded superior division, with a TICI 3 revascularization. Spasm was noted at the proximal superior division. This gradually responded to more intra arterial nitroglycerin over the next 20 minutes. A final control arteriogram performed through the balloon guide catheter in the right internal carotid artery demonstrated significantly improved flow through the proximal right middle cerebral superior division. No evidence of intraluminal filling defects, or of dissection flaps were noted. A final control arteriogram performed through the balloon guide in the right common carotid demonstrated wide patency of the right internal carotid artery extra cranially and intracranially with TICI 3 revascularization. Right ACA and right PCA remained normally patent. Balloon guide was removed. The 8 French Pinnacle sheath in the right groin was replaced with an 8 French Angio-Seal closure device for hemostasis. Distal pulses remained palpable in both feet unchanged. A CT of the brain demonstrated no evidence of intracranial hemorrhage. Patient was then extubated without difficulty. Upon recovery, the patient was able to bend the left knee and raise the left forearm against gravity. She responded appropriately to simple commands. Her pupils were equal. She denied any headaches or nausea. She was transferred to the PACU and then neuro ICU post thrombectomy care. IMPRESSION: Status post endovascular complete revascularization of occluded right middle cerebral artery M1 segment with 1 pass  with contact aspiration, and 1 pass with a 3 mm x 40 mm Solitaire X retrieval device and contact aspiration achieving a TICI 3 revascularization. PLAN: Follow-up in the clinic 2-4 weeks post discharge. Electronically Signed   By: Julieanne Cotton M.D.   On: 10/14/2020 12:05   VAS [place] TRANSCRANIAL DOPPLER W BUBBLES  Result Date: 10/15/2020  Transcranial Doppler with Bubble Patient Name:  Kristen Grant  Date of Exam:   10/14/2020 Medical Rec #: [id number]       Accession #:    [id number] Date of Birth: Nov 13, 1995        Patient Gender: F Patient Age:   25 years Exam Location:  [facility] Procedure:      VAS [place] TRANSCRANIAL DOPPLER W BUBBLES Referring Phys: PRAMOD SETHI --------------------------------------------------------------------------------  Indications: Stroke. Performing Technologist: [place] Ponder RVS  Examination Guidelines: A complete evaluation includes B-mode imaging, spectral Doppler, color Doppler, and power Doppler as needed of all accessible portions of each vessel. Bilateral testing is considered an integral part of a complete examination. Limited examinations for reoccurring indications may be performed as noted.  Summary:  A vascular evaluation was performed. The right middle cerebral artery was studied. An IV was inserted into the patient's right forearm . Verbal informed consent was obtained.  HITS heard at rest: spencer 3 HITS heard during valsalva: Partial curtain Positive TCD Bubble study indicative of right to left medium size shunt at rest and with valsalva *See table(s) above for TCD measurements and observations.  Diagnosing physician: Delia Heady MD Electronically signed by Delia Heady MD on 10/15/2020 at 12:47:16 PM.    Final    ECHOCARDIOGRAM COMPLETE  Result Date: 10/11/2020    ECHOCARDIOGRAM REPORT   Patient Name:   Kristen Grant Date of Exam: 10/11/2020 Medical Rec #:  [id number]      Height:       67.0 in Accession #:    [id number]     Weight:       208.6 lb Date of  Birth:  11/19/1995  BSA:          2.059 m Patient Age:    25 years       BP:           124/76 mmHg Patient Gender: F              HR:           76 bpm. Exam Location:  Inpatient Procedure: 2D Echo Indications:    stroke  History:        Patient has no prior history of Echocardiogram examinations.  Sonographer:    Delcie Roch RDCS Referring Phys: [id number] ERIC LINDZEN IMPRESSIONS  1. Left ventricular ejection fraction, by estimation, is 60 to 65%. The left ventricle has normal function. The left ventricle has no regional wall motion abnormalities. Left ventricular diastolic parameters were normal.  2. Right ventricular systolic function is normal. The right ventricular size is normal. Tricuspid regurgitation signal is inadequate for assessing PA pressure.  3. The mitral valve is normal in structure. No evidence of mitral valve regurgitation. No evidence of mitral stenosis.  4. The aortic valve was not well visualized. Aortic valve regurgitation is not visualized. No aortic stenosis is present. Conclusion(s)/Recommendation(s): No intracardiac source of embolism detected on this transthoracic study. A transesophageal echocardiogram is recommended to exclude cardiac source of embolism if clinically indicated. FINDINGS  Left Ventricle: Left ventricular ejection fraction, by estimation, is 60 to 65%. The left ventricle has normal function. The left ventricle has no regional wall motion abnormalities. The left ventricular internal cavity size was normal in size. There is  no left ventricular hypertrophy. Left ventricular diastolic parameters were normal. Right Ventricle: The right ventricular size is normal. No increase in right ventricular wall thickness. Right ventricular systolic function is normal. Tricuspid regurgitation signal is inadequate for assessing PA pressure. Left Atrium: Left atrial size was normal in size. Right Atrium: Right atrial size was normal in size. Pericardium: There is no evidence of  pericardial effusion. Mitral Valve: The mitral valve is normal in structure. No evidence of mitral valve regurgitation. No evidence of mitral valve stenosis. Tricuspid Valve: The tricuspid valve is normal in structure. Tricuspid valve regurgitation is trivial. Aortic Valve: The aortic valve was not well visualized. Aortic valve regurgitation is not visualized. No aortic stenosis is present. Pulmonic Valve: The pulmonic valve was not well visualized. Pulmonic valve regurgitation is not visualized. Aorta: The aortic root and ascending aorta are structurally normal, with no evidence of dilitation. IAS/Shunts: The interatrial septum was not well visualized.  LEFT VENTRICLE PLAX 2D LVIDd:         4.50 cm  Diastology LVIDs:         2.90 cm  LV e' medial:    10.60 cm/s LV PW:         0.90 cm  LV E/e' medial:  7.6 LV IVS:        0.80 cm  LV e' lateral:   15.90 cm/s LVOT diam:     1.90 cm  LV E/e' lateral: 5.0 LV SV:         62 LV SV Index:   30 LVOT Area:     2.84 cm  RIGHT VENTRICLE             IVC RV S prime:     12.90 cm/s  IVC diam: 1.10 cm TAPSE (M-mode): 2.3 cm LEFT ATRIUM             Index  RIGHT ATRIUM          Index LA diam:        3.20 cm 1.55 cm/m  RA Area:     9.88 cm LA Vol (A2C):   31.7 ml 15.40 ml/m RA Volume:   19.60 ml 9.52 ml/m LA Vol (A4C):   22.5 ml 10.93 ml/m LA Biplane Vol: 26.8 ml 13.02 ml/m  AORTIC VALVE LVOT Vmax:   126.00 cm/s LVOT Vmean:  81.500 cm/s LVOT VTI:    0.217 m  AORTA Ao Root diam: 2.80 cm Ao Asc diam:  2.60 cm MITRAL VALVE MV Area (PHT): 4.49 cm    SHUNTS MV Decel Time: 169 msec    Systemic VTI:  0.22 m MV E velocity: 80.10 cm/s  Systemic Diam: 1.90 cm MV A velocity: 51.80 cm/s MV E/A ratio:  1.55 Epifanio Lesches MD Electronically signed by Epifanio Lesches MD Signature Date/Time: 10/11/2020/10:54:58 PM    Final    ECHO TEE  Result Date: 10/17/2020    TRANSESOPHOGEAL ECHO REPORT   Patient Name:   Kristen Grant Date of Exam: 10/17/2020 Medical Rec #:  [id number]       Height:       69.0 in Accession #:    [id number]     Weight:       195.1 lb Date of Birth:  1995-08-23       BSA:          2.044 m Patient Age:    25 years       BP:           126/57 mmHg Patient Gender: F              HR:           96 bpm. Exam Location:  Inpatient Procedure: Transesophageal Echo, Cardiac Doppler, Color Doppler and 3D Echo Indications:     Stroke  History:         Patient has prior history of Echocardiogram examinations, most                  recent 10/11/2020.  Sonographer:     Roosvelt Maser RDCS Referring Phys:  [id number] [facility] Clarita B ROBERTS Diagnosing Phys: Donato Schultz MD PROCEDURE: The patient was intubated. The transesophogeal probe was passed without difficulty through the esophogus of the patient. Sedation performed by different physician. The patient was monitored while under deep sedation. The patient's vital signs;  including heart rate, blood pressure, and oxygen saturation; remained stable throughout the procedure. The patient developed no complications during the procedure. IMPRESSIONS  1. There are very few bubbles seen in cross over upon second bubble study performed. A small PFO may be present. There is also small color flow Doppler signal seen at PFO sight.  2. Left ventricular ejection fraction, by estimation, is 60 to 65%. The left ventricle has normal function.  3. Right ventricular systolic function is normal. The right ventricular size is normal.  4. No left atrial/left atrial appendage thrombus was detected.  5. The mitral valve is normal in structure. No evidence of mitral valve regurgitation.  6. The aortic valve is normal in structure. Aortic valve regurgitation is not visualized.  7. Minimal evidence of shunting. FINDINGS  Left Ventricle: Left ventricular ejection fraction, by estimation, is 60 to 65%. The left ventricle has normal function. The left ventricular internal cavity size was small. Right Ventricle: The right ventricular size is normal. No increase in right ventricular  wall thickness. Right  ventricular systolic function is normal. Left Atrium: Left atrial size was normal in size. No left atrial/left atrial appendage thrombus was detected. Right Atrium: Right atrial size was normal in size. Pericardium: There is no evidence of pericardial effusion. Mitral Valve: The mitral valve is normal in structure. No evidence of mitral valve regurgitation. Tricuspid Valve: The tricuspid valve is normal in structure. Tricuspid valve regurgitation is not demonstrated. Aortic Valve: The aortic valve is normal in structure. Aortic valve regurgitation is not visualized. Pulmonic Valve: The pulmonic valve was normal in structure. Pulmonic valve regurgitation is not visualized. Aorta: The aortic root is normal in size and structure. IAS/Shunts: Minimal evidence of shunting. Agitated saline contrast was given intravenously to evaluate for intracardiac shunting. Donato Schultz MD Electronically signed by Donato Schultz MD Signature Date/Time: 10/17/2020/2:33:17 PM    Final    IR PERCUTANEOUS ART THROMBECTOMY/INFUSION INTRACRANIAL INC DIAG ANGIO  Result Date: 10/21/2020 INDICATION: Recurrent left-sided hemiplegia, right gaze deviation and dysarthria. 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 EXAM: 1. EMERGENT LARGE VESSEL OCCLUSION THROMBOLYSIS (anterior CIRCULATION) COMPARISON:  CT angiogram of the head and neck of 10/12/2020. MEDICATIONS: Ancef 2 g IV antibiotic was administered within 1 hour of the procedure. ANESTHESIA/SEDATION: General anesthesia. CONTRAST:  Omnipaque 350 approximately 100 mL. FLUOROSCOPY TIME:  Fluoroscopy Time: 43 minutes (2041 mGy). COMPLICATIONS: None immediate. TECHNIQUE: Following a full explanation of the procedure along with the potential associated complications, an informed witnessed consent was obtained from the mother. The risks of intracranial hemorrhage of 10%, worsening neurological deficit, ventilator  dependency, death and inability to revascularize were all reviewed in detail with the patient's mother. The patient was then put under general anesthesia by the Department of Anesthesiology at [facility]. The right groin was prepped and draped in the usual sterile fashion. Thereafter using modified Seldinger technique, transfemoral access into the right common femoral artery was obtained without difficulty. Over a 0.035 inch guidewire an 8 French 25 cm Pinnacle sheath was inserted. Through this, and also over a 0.035 inch guidewire a combination of a 5 [place] JB 1 catheter inside of an 087 balloon guide catheter which had been prepped with 50% contrast and 50% heparinized saline infusion was advanced to the aortic arch region and eventually the right common carotid artery. The JB 1 catheter, and the wire were removed. Good aspiration obtained from the hub of the balloon guide catheter. FINDINGS: A gentle control arteriogram performed through balloon guide catheter demonstrates the right external carotid artery and its major branches to be widely patent. The right internal carotid artery at the bulb to the cranial skull base demonstrates wide patency. The petrous, cavernous and supraclinoid segments demonstrate wide patency. A stump of an occluded right middle cerebral superior division was seen. The inferior division opacified into the capillary and venous phases. The right anterior cerebral artery and the right posterior communicating artery demonstrate wide patency into the capillary and venous phases. PROCEDURE: Over a 0.014 inch standard Synchro micro guidewire with a J configuration, an 021 160 cm microcatheter inside of a 50 132 cm Zoom aspiration catheter was advanced to the supraclinoid right ICA. The micro guidewire was then gently manipulated and advanced through the occluded superior division into the M2 M3 region followed by the microcatheter. The guidewire was removed. Good aspiration obtained  from the hub of the microcatheter. A gentle control arteriogram performed through the microcatheter demonstrated safe position of the tip of the microcatheter which  was then connected to continuous heparinized saline infusion. A 4 mm x 40 mm Solitaire X retrieval device was then advanced to the distal end of the micro catheter. The retrieval device was then deployed in the usual manner by retrieving the microcatheter. The proximal portion of the retrieval device was noted just proximal to the origin of the superior division. Thereafter, with constant aspiration being applied through the Zoom aspiration catheter using a Penumbra aspiration device, now advanced into the proximal superior division, with proximal flow arrest and aspiration with a 20 mL syringe at the hub of the balloon guide catheter for approximately 3 minutes, the combination of the retrieval device, the microcatheter and the Zoom aspiration catheter were retrieved and removed. Upon reversal of flow arrest in the right ICA, a control arteriogram performed through the balloon guide in the right internal carotid artery demonstrated revascularization of the superior division with a tiny branch supplying the anterior 2 branches of the perisylvian branch noted to be occluded. Over an 0.014 inch standard Synchro micro guidewire with an 021 160 cm microcatheter, access into the small branch of the superior division were unsuccessful. Multiple attempts were then made using an 035 inch Zoom aspiration catheter with initially an 021 Synchro standard micro guidewire, and then an Aristotle 014 inch micro guidewire without success. A control arteriogram performed through the balloon guide catheter in the right internal carotid artery demonstrated a 2B to 2C revascularization of the right middle cerebral artery territory. No further attempts were then given this made to catheterize the small perisylvian branch arising from the superior division given that this was  essentially a core infarct seen on the perfusion images. The balloon guide catheter was then retrieved into the right common carotid artery, and a control arteriogram performed demonstrated wide patency of the right internal carotid artery extra cranially and intracranially with no change in the intracranial circulation. An 8 French Pinnacle sheath was then replaced with an 8 French Angio-Seal closure device for hemostasis. The right groin appeared soft. Distal pulses remained palpable in both feet unchanged. A CT of the brain demonstrated no evidence of intracranial hemorrhage. Minimal mass effect was appreciated in the right lateral ventricle region. Patient was then extubated without difficulty. Upon recovery, the patient was able to respond to simple commands appropriately. She was oriented to place. Left facial droop was appreciated. The tongue was in the midline. Patient is able to bend her left knee, and able to lift her distal left upper extremity against gravity. Patient was then transferred to the ICU for post revascularization care. 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 PLAN: Follow-up in the clinic 4 weeks post discharge. Electronically Signed   By: Julieanne Cotton M.D.   On: 10/14/2020 13:28   IR PERCUTANEOUS ART THROMBECTOMY/INFUSION INTRACRANIAL INC DIAG ANGIO  Result Date: 10/21/2020 INDICATION: New onset right gaze deviation, left side hemiplegia, and left-sided neglect. Occluded right middle cerebral artery M1 segment on CT angio of the head and neck. EXAM: 1. EMERGENT LARGE VESSEL OCCLUSION THROMBOLYSIS (anterior CIRCULATION) COMPARISON:  CT angiogram of the head and neck of 10/11/2020. MEDICATIONS: Ancef 2 g IV antibiotic was administered within 1 hour of the procedure.  ANESTHESIA/SEDATION: General anesthesia. CONTRAST:  Omnipaque 300 approximately 100 mL FLUOROSCOPY TIME:  Fluoroscopy Time: 39 minutes 12 seconds (1687 mGy). COMPLICATIONS:  None immediate. TECHNIQUE: Following a full explanation of the procedure along with the potential associated complications, an informed witnessed consent was obtained. The risks of intracranial hemorrhage of 10%, worsening neurological deficit, ventilator dependency, death and inability to revascularize were all reviewed in detail with the patient's mother. The patient was then put under general anesthesia by the Department of Anesthesiology at [facility]. The right groin was prepped and draped in the usual sterile fashion. Thereafter using modified Seldinger technique, transfemoral access into the right common femoral artery was obtained without difficulty. Over a 0.035 inch guidewire an 8 [place] Pinnacle 25 cm sheath was inserted. Through this, and also over a 0.035 inch guidewire a 5 [place] JB 1 catheter was advanced to the aortic arch region and selectively positioned in the right common carotid artery. FINDINGS: Right common carotid arteriogram demonstrates the right external carotid artery and its major branches to be widely patent. The right internal carotid artery at the bulb to the cranial skull base is widely patent. The petrous, cavernous and the supraclinoid segments are widely patent. The right posterior communicating artery is seen opacifying the right posterior cerebral distribution. The right middle cerebral artery demonstrates complete occlusion just distal to the origin of the anterior temporal branch. The right anterior cerebral artery opacifies into the capillary and venous phases with delayed retrograde opacification of the middle perisylvian branches of the superior division via the right anterior cerebral artery pial collaterals. PROCEDURE: The diagnostic catheter in the right common carotid artery was then  exchanged over a 0.035 inch 300 cm Rosen exchange guidewire for a 90 cm 087 balloon guide catheter which had been prepped with 50% contrast and 50% heparinized saline infusion. The balloon guide catheter was then advanced to the mid right ICA over the exchange micro guidewire without difficulty. The guidewire was removed. Good aspiration was obtained from the hub of the balloon guide catheter in the mid right ICA. Gentle control arteriogram performed through this demonstrated no evidence of spasms, dissections or of intraluminal filling defects. An intracranial examination revealed no change in the occluded right middle cerebral artery. Using an 0.014 inch standard Synchro micro guidewire with a J-tip configuration, a combination of an 014 inch 160 cm microcatheter inside of an 071 132 cm Zoom aspiration catheter was advanced to the supraclinoid right ICA. The micro guidewire was then gently manipulated to advance to the proximal portion of the occluded right middle cerebral artery. The micro guidewire, and the microcatheter were gently retrieved as the 071 Zoom aspiration catheter was advanced into the occluded right middle cerebral artery. Constant aspiration was then applied into the Zoom aspiration catheter with aspiration device for approximately 3 minutes. During this gentle manipulation was also performed to advance the Zoom aspiration catheter. With proximal flow arrest in the right internal carotid artery, Zoom aspiration catheter were retrieved and removed. Following flow reversal in the right internal carotid artery, an arteriogram performed through the balloon guide in the right internal carotid artery now demonstrated revascularization of the right middle cerebral artery and superior divisions with occlusion of a superior division branch in the distal M2 region. A TICI 2C revascularization was achieved. Extensive vasospasm was also noted in the right middle cerebral artery M1 segment and the origin of  the superior division, which responded to 5 25 mcg aliquots of nitroglycerin intra-arterially, and of 2.5 mg of verapamil IA. Over a 0.14 inch standard Synchro micro guidewire, inside of an 021 160 cm microcatheter and 55 cm 132 Zoom aspiration  catheter combination was then advanced to supraclinoid right ICA. The micro guidewire was then gently manipulated with a torque device and advanced through the occluded superior division branch into the distal M2 region followed by the microcatheter. The guidewire was removed. Free aspiration of blood was noted at the hub of the microcatheter. A 3 mm x 40 mm Solitaire X retrieval device was then advanced to the distal end of the microcatheter and deployed in the usual manner. The Zoom aspiration catheter was advanced into the origin of the superior division. With proximal flow arrest in the right internal carotid artery, constant aspiration was now applied at the hub of the balloon guide catheter with a 20 mL syringe, and aspiration device at the hub of the Zoom aspiration catheter for approximately 3 minutes. Thereafter, the combination of the retrieval device the microcatheter and Zoom aspiration catheter were retrieved and removed. Following reversal of flow arrest, a control arteriogram performed through the balloon guide in the right internal carotid artery now demonstrated complete revascularization of the previously occluded superior division, with a TICI 3 revascularization. Spasm was noted at the proximal superior division. This gradually responded to more intra arterial nitroglycerin over the next 20 minutes. A final control arteriogram performed through the balloon guide catheter in the right internal carotid artery demonstrated significantly improved flow through the proximal right middle cerebral superior division. No evidence of intraluminal filling defects, or of dissection flaps were noted. A final control arteriogram performed through the balloon guide in the  right common carotid demonstrated wide patency of the right internal carotid artery extra cranially and intracranially with TICI 3 revascularization. Right ACA and right PCA remained normally patent. Balloon guide was removed. The 8 French Pinnacle sheath in the right groin was replaced with an 8 French Angio-Seal closure device for hemostasis. Distal pulses remained palpable in both feet unchanged. A CT of the brain demonstrated no evidence of intracranial hemorrhage. Patient was then extubated without difficulty. Upon recovery, the patient was able to bend the left knee and raise the left forearm against gravity. She responded appropriately to simple commands. Her pupils were equal. She denied any headaches or nausea. She was transferred to the PACU and then neuro ICU post thrombectomy care. IMPRESSION: Status post endovascular complete revascularization of occluded right middle cerebral artery M1 segment with 1 pass with contact aspiration, and 1 pass with a 3 mm x 40 mm Solitaire X retrieval device and contact aspiration achieving a TICI 3 revascularization. PLAN: Follow-up in the clinic 2-4 weeks post discharge. Electronically Signed   By: Julieanne Cotton M.D.   On: 10/14/2020 12:05   CT HEAD CODE STROKE WO CONTRAST  Result Date: 10/11/2020 CLINICAL DATA:  Code stroke.  Weakness EXAM: CT HEAD WITHOUT CONTRAST TECHNIQUE: Contiguous axial images were obtained from the base of the skull through the vertex without intravenous contrast. COMPARISON:  None. FINDINGS: Brain: There is hypodensity in the right temporal lobe. The remainder of the brain parenchyma is normal in appearance. There is no evidence of acute intracranial hemorrhage. There is no mass lesion. There is no midline shift. Vascular: There is a hyperdense vessel sign on the right. Skull: Normal. Negative for fracture or focal lesion. Sinuses/Orbits: There are mucous retention cysts in the bilateral maxillary and right sphenoid sinuses. The globes  and orbits are unremarkable. Other: None. ASPECTS [facility] Stroke Program Early CT Score) - Ganglionic level infarction (caudate, lentiform nuclei, internal capsule, insula, M1-M3 cortex): 3 - Supraganglionic infarction (M4-M6 cortex): 7 Total score (  0-10 with 10 being normal): 10 IMPRESSION: 1. Hypodensity in the right anterior temporal lobe is concerning for infarct given hyperdense vessel sign on the right. Vascular imaging has already been performed at the time of dictation. 2. ASPECTS is 10 These results were called by telephone at the time of interpretation on 10/11/2020 at 9:55 am to provider DR Otelia Limes, who verbally acknowledged these results. Electronically Signed   By: Lesia Hausen M.D.   On: 10/11/2020 09:56   VAS [place] LOWER EXTREMITY VENOUS (DVT)  Result Date: 10/14/2020  Lower Venous DVT Study Patient Name:  Kristen Grant  Date of Exam:   10/14/2020 Medical Rec #: [id number]       Accession #:    [id number] Date of Birth: August 10, 1995        Patient Gender: F Patient Age:   25 years Exam Location:  [facility] Procedure:      VAS [place] LOWER EXTREMITY VENOUS (DVT) Referring Phys: Hilda Lias FRANCOIS --------------------------------------------------------------------------------  Indications: Stroke.  Limitations: Bandages. Comparison Study: no prior Performing Technologist: [place] Ponder RVS  Examination Guidelines: A complete evaluation includes B-mode imaging, spectral Doppler, color Doppler, and power Doppler as needed of all accessible portions of each vessel. Bilateral testing is considered an integral part of a complete examination. Limited examinations for reoccurring indications may be performed as noted. The reflux portion of the exam is performed with the patient in reverse Trendelenburg.  +---------+---------------+---------+-----------+----------+-------------------+ RIGHT    CompressibilityPhasicitySpontaneityPropertiesThrombus Aging       +---------+---------------+---------+-----------+----------+-------------------+ CFV                                                   not visualized due                                                        to bandages         +---------+---------------+---------+-----------+----------+-------------------+ FV Prox  Full                                                             +---------+---------------+---------+-----------+----------+-------------------+ FV Mid   Full                                                             +---------+---------------+---------+-----------+----------+-------------------+ FV DistalFull                                                             +---------+---------------+---------+-----------+----------+-------------------+ PFV      Full                                                             +---------+---------------+---------+-----------+----------+-------------------+  POP      Full           Yes      Yes                                      +---------+---------------+---------+-----------+----------+-------------------+ PTV      Full                                                             +---------+---------------+---------+-----------+----------+-------------------+ PERO     Full                                                             +---------+---------------+---------+-----------+----------+-------------------+   +---------+---------------+---------+-----------+----------+--------------+ LEFT     CompressibilityPhasicitySpontaneityPropertiesThrombus Aging +---------+---------------+---------+-----------+----------+--------------+ CFV      Full           Yes      Yes                                 +---------+---------------+---------+-----------+----------+--------------+ SFJ      Full                                                         +---------+---------------+---------+-----------+----------+--------------+ FV Prox  Full                                                        +---------+---------------+---------+-----------+----------+--------------+ FV Mid   Full                                                        +---------+---------------+---------+-----------+----------+--------------+ FV DistalFull                                                        +---------+---------------+---------+-----------+----------+--------------+ PFV      Full                                                        +---------+---------------+---------+-----------+----------+--------------+ POP      Full           Yes      Yes                                 +---------+---------------+---------+-----------+----------+--------------+  PTV      Full                                                        +---------+---------------+---------+-----------+----------+--------------+ PERO     Full                                                        +---------+---------------+---------+-----------+----------+--------------+     Summary: BILATERAL: - No evidence of deep vein thrombosis seen in the lower extremities, bilaterally. -No evidence of popliteal cyst, bilaterally.   *See table(s) above for measurements and observations. Electronically signed by Lemar Livings MD on 10/14/2020 at 11:00:27 PM.    Final    CT ANGIO HEAD NECK W WO CM W PERF (CODE STROKE)  Result Date: 10/12/2020 CLINICAL DATA:  Neuro deficit, acute, stroke suspected EXAM: CT ANGIOGRAPHY HEAD AND NECK CT PERFUSION BRAIN TECHNIQUE: Multidetector CT imaging of the head and neck was performed using the standard protocol during bolus administration of intravenous contrast. Multiplanar CT image reconstructions and MIPs were obtained to evaluate the vascular anatomy. Carotid stenosis measurements (when applicable) are obtained utilizing NASCET criteria,  using the distal internal carotid diameter as the denominator. Multiphase CT imaging of the brain was performed following IV bolus contrast injection. Subsequent parametric perfusion maps were calculated using RAPID software. CONTRAST:  OMNIPAQUE IOHEXOL 350 MG/ML SOLN COMPARISON:  10/11/2020. FINDINGS: CT HEAD FINDINGS Brain: Hypoattenuation and loss of gray-white differentiation in the right MCA territory, including the right frontal lobe, right insula, internal capsule right lentiform nucleus, caudate, and inferior right frontal cortex/operculum. Slight midline shift. No acute hemorrhage, hydrocephalus, mass lesion, or visible extra-axial fluid collection. ASPECTS [facility] Stroke Program Early CT Score) - Ganglionic level infarction (caudate, lentiform nuclei, internal capsule, insula, M1-M3 cortex): 1 - Supraganglionic infarction (M4-M6 cortex): 3 Total score (0-10 with 10 being normal): 4 Vascular: See below. Skull: No acute fracture. Sinuses: No acute findings. Orbits: No acute findings. Review of the MIP images confirms the above findings CTA NECK FINDINGS Aortic arch: Great vessel origins are patent. Right carotid system: No evidence of dissection, stenosis (50% or greater) or occlusion. Left carotid system: No evidence of dissection, stenosis (50% or greater) or occlusion. Vertebral arteries: Mildly left dominant. No significant (greater than 50%) stenosis. Skeleton: No acute abnormality. Other neck: No acute abnormality. CTA HEAD FINDINGS Anterior circulation: Bilateral ICAs and M1 MCAs are patent. Occlusion a of an anterior right M2 MCA branch (see series 11, images 237 through 240). Bilateral ACAs are patent. Posterior circulation: Bilateral intradural vertebral arteries basilar artery and posterior cerebral arteries are patent without proximal high-grade stenosis. Venous: No evidence of dural sinus thrombosis. CT Brain Perfusion Findings: ASPECTS: 4 CBF (<30%) Volume: 67mL Perfusion (Tmax>6.0s)  volume: 17mL Mismatch Volume: 81mL Infarction Location:Right MCA territory IMPRESSION: CT Head: 1. 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 ASPECTS is 4. Slight leftward midline shift. 2. No acute hemorrhage. CTA: 1. Proximal right M2 MCA occlusion. 2. No significant stenosis in the neck. CT Perfusion: Large area of core infarct in the right  MCA territory, measuring at least 55 mL and likely underestimated given superior extent is not imaged. Small (8 mL) area of ischemic penumbra. Findings discussed with Dr. Otelia Limes via telephone at 4:00 PM Electronically Signed   By: Feliberto Harts M.D.   On: 10/12/2020 16:26   CT ANGIO HEAD NECK W WO CM W PERF (CODE STROKE)  Result Date: 10/11/2020 EXAM: CT ANGIOGRAPHY HEAD AND NECK CT PERFUSION BRAIN TECHNIQUE: Multidetector CT imaging of the head and neck was performed using the standard protocol during bolus administration of intravenous contrast. Multiplanar CT image reconstructions and MIPs were obtained to evaluate the vascular anatomy. Carotid stenosis measurements (when applicable) are obtained utilizing NASCET criteria, using the distal internal carotid diameter as the denominator. Multiphase CT imaging of the brain was performed following IV bolus contrast injection. Subsequent parametric perfusion maps were calculated using RAPID software. CONTRAST:  OMNIPAQUE IOHEXOL 350 MG/ML SOLN COMPARISON:  Same-day noncontrast CT head FINDINGS: CTA NECK FINDINGS Aortic arch: Standard branching. Imaged portion shows no evidence of aneurysm or dissection. No significant stenosis of the major arch vessel origins. Right carotid system: No evidence of dissection, stenosis (50% or greater) or occlusion. Left carotid system: No evidence of dissection, stenosis (50% or greater) or occlusion. Vertebral arteries: The left vertebral artery is dominant, a  normal variant. The vertebral arteries are patent, without hemodynamically significant stenosis, occlusion, or dissection. Skeleton: The bones are normal. Other neck: The soft tissues are normal. Upper chest: The lung apices are clear. Review of the MIP images confirms the above findings CTA HEAD FINDINGS Anterior circulation: The bilateral intracranial ICAs are patent. There is thrombus in the distal right M1 segment with near complete occlusion. There is reconstitution of flow in the superior M2 division but no inferior M2 division is identified. The left MCA and bilateral ACAs are patent. There is no aneurysm. Posterior circulation: The V4 segments of the vertebral arteries are patent. The basilar artery is patent. The bilateral PCAs are patent. Venous sinuses: Not evaluated due to contrast timing. Anatomic variants: None. Review of the MIP images confirms the above findings CT Brain Perfusion Findings: CBF (<30%) Volume: 11mL Perfusion (Tmax>6.0s) volume: 86mL Mismatch Volume: 75mL Infarction Location:Right corona radiata IMPRESSION: 1. Thrombus in the distal right M1 segment with near complete occlusion. There is reconstitution of flow in superior M2 division but no inferior M2 division is identified. 2. 11 cc infarct core in the right corona radiata, 75 cc penumbra. These results were called by telephone at the time of interpretation on 10/11/2020 at 9:58 am to provider Dr Otelia Limes, who verbally acknowledged these results. Electronically Signed   By: Lesia Hausen M.D.   On: 10/11/2020 10:10   IR ANGIO INTRA EXTRACRAN SEL COM CAROTID INNOMINATE UNI L MOD SED  Result Date: 10/21/2020 INDICATION: New onset right gaze deviation, left side hemiplegia, and left-sided neglect. Occluded right middle cerebral artery M1 segment on CT angio of the head and neck. EXAM: 1. EMERGENT LARGE VESSEL OCCLUSION THROMBOLYSIS (anterior CIRCULATION) COMPARISON:  CT angiogram of the head and neck of 10/11/2020. MEDICATIONS: Ancef 2  g IV antibiotic was administered within 1 hour of the procedure. ANESTHESIA/SEDATION: General anesthesia. CONTRAST:  Omnipaque 300 approximately 100 mL FLUOROSCOPY TIME:  Fluoroscopy Time: 39 minutes 12 seconds (1687 mGy). COMPLICATIONS: None immediate. TECHNIQUE: Following a full explanation of the procedure along with the potential associated complications, an informed witnessed consent was obtained. The risks of intracranial hemorrhage of 10%, worsening neurological deficit, ventilator dependency, death and inability to  revascularize were all reviewed in detail with the patient's mother. The patient was then put under general anesthesia by the Department of Anesthesiology at [facility]. The right groin was prepped and draped in the usual sterile fashion. Thereafter using modified Seldinger technique, transfemoral access into the right common femoral artery was obtained without difficulty. Over a 0.035 inch guidewire an 8 [place] Pinnacle 25 cm sheath was inserted. Through this, and also over a 0.035 inch guidewire a 5 [place] JB 1 catheter was advanced to the aortic arch region and selectively positioned in the right common carotid artery. FINDINGS: Right common carotid arteriogram demonstrates the right external carotid artery and its major branches to be widely patent. The right internal carotid artery at the bulb to the cranial skull base is widely patent. The petrous, cavernous and the supraclinoid segments are widely patent. The right posterior communicating artery is seen opacifying the right posterior cerebral distribution. The right middle cerebral artery demonstrates complete occlusion just distal to the origin of the anterior temporal branch. The right anterior cerebral artery opacifies into the capillary and venous phases with delayed retrograde opacification of the middle perisylvian branches of the superior division via the right anterior cerebral artery pial collaterals. PROCEDURE: The  diagnostic catheter in the right common carotid artery was then exchanged over a 0.035 inch 300 cm Rosen exchange guidewire for a 90 cm 087 balloon guide catheter which had been prepped with 50% contrast and 50% heparinized saline infusion. The balloon guide catheter was then advanced to the mid right ICA over the exchange micro guidewire without difficulty. The guidewire was removed. Good aspiration was obtained from the hub of the balloon guide catheter in the mid right ICA. Gentle control arteriogram performed through this demonstrated no evidence of spasms, dissections or of intraluminal filling defects. An intracranial examination revealed no change in the occluded right middle cerebral artery. Using an 0.014 inch standard Synchro micro guidewire with a J-tip configuration, a combination of an 014 inch 160 cm microcatheter inside of an 071 132 cm Zoom aspiration catheter was advanced to the supraclinoid right ICA. The micro guidewire was then gently manipulated to advance to the proximal portion of the occluded right middle cerebral artery. The micro guidewire, and the microcatheter were gently retrieved as the 071 Zoom aspiration catheter was advanced into the occluded right middle cerebral artery. Constant aspiration was then applied into the Zoom aspiration catheter with aspiration device for approximately 3 minutes. During this gentle manipulation was also performed to advance the Zoom aspiration catheter. With proximal flow arrest in the right internal carotid artery, Zoom aspiration catheter were retrieved and removed. Following flow reversal in the right internal carotid artery, an arteriogram performed through the balloon guide in the right internal carotid artery now demonstrated revascularization of the right middle cerebral artery and superior divisions with occlusion of a superior division branch in the distal M2 region. A TICI 2C revascularization was achieved. Extensive vasospasm was also noted in  the right middle cerebral artery M1 segment and the origin of the superior division, which responded to 5 25 mcg aliquots of nitroglycerin intra-arterially, and of 2.5 mg of verapamil IA. Over a 0.14 inch standard Synchro micro guidewire, inside of an 021 160 cm microcatheter and 55 cm 132 Zoom aspiration catheter combination was then advanced to supraclinoid right ICA. The micro guidewire was then gently manipulated with a torque device and advanced through the occluded superior division branch into the distal M2 region followed by the microcatheter. The  guidewire was removed. Free aspiration of blood was noted at the hub of the microcatheter. A 3 mm x 40 mm Solitaire X retrieval device was then advanced to the distal end of the microcatheter and deployed in the usual manner. The Zoom aspiration catheter was advanced into the origin of the superior division. With proximal flow arrest in the right internal carotid artery, constant aspiration was now applied at the hub of the balloon guide catheter with a 20 mL syringe, and aspiration device at the hub of the Zoom aspiration catheter for approximately 3 minutes. Thereafter, the combination of the retrieval device the microcatheter and Zoom aspiration catheter were retrieved and removed. Following reversal of flow arrest, a control arteriogram performed through the balloon guide in the right internal carotid artery now demonstrated complete revascularization of the previously occluded superior division, with a TICI 3 revascularization. Spasm was noted at the proximal superior division. This gradually responded to more intra arterial nitroglycerin over the next 20 minutes. A final control arteriogram performed through the balloon guide catheter in the right internal carotid artery demonstrated significantly improved flow through the proximal right middle cerebral superior division. No evidence of intraluminal filling defects, or of dissection flaps were noted. A final  control arteriogram performed through the balloon guide in the right common carotid demonstrated wide patency of the right internal carotid artery extra cranially and intracranially with TICI 3 revascularization. Right ACA and right PCA remained normally patent. Balloon guide was removed. The 8 French Pinnacle sheath in the right groin was replaced with an 8 French Angio-Seal closure device for hemostasis. Distal pulses remained palpable in both feet unchanged. A CT of the brain demonstrated no evidence of intracranial hemorrhage. Patient was then extubated without difficulty. Upon recovery, the patient was able to bend the left knee and raise the left forearm against gravity. She responded appropriately to simple commands. Her pupils were equal. She denied any headaches or nausea. She was transferred to the PACU and then neuro ICU post thrombectomy care. IMPRESSION: Status post endovascular complete revascularization of occluded right middle cerebral artery M1 segment with 1 pass with contact aspiration, and 1 pass with a 3 mm x 40 mm Solitaire X retrieval device and contact aspiration achieving a TICI 3 revascularization. PLAN: Follow-up in the clinic 2-4 weeks post discharge. Electronically Signed   By: Julieanne Cotton M.D.   On: 10/14/2020 12:05   IR ANGIO VERTEBRAL SEL VERTEBRAL UNI L MOD SED  Result Date: 10/21/2020 INDICATION: New onset right gaze deviation, left side hemiplegia, and left-sided neglect. Occluded right middle cerebral artery M1 segment on CT angio of the head and neck. EXAM: 1. EMERGENT LARGE VESSEL OCCLUSION THROMBOLYSIS (anterior CIRCULATION) COMPARISON:  CT angiogram of the head and neck of 10/11/2020. MEDICATIONS: Ancef 2 g IV antibiotic was administered within 1 hour of the procedure. ANESTHESIA/SEDATION: General anesthesia. CONTRAST:  Omnipaque 300 approximately 100 mL FLUOROSCOPY TIME:  Fluoroscopy Time: 39 minutes 12 seconds (1687 mGy). COMPLICATIONS: None immediate. TECHNIQUE:  Following a full explanation of the procedure along with the potential associated complications, an informed witnessed consent was obtained. The risks of intracranial hemorrhage of 10%, worsening neurological deficit, ventilator dependency, death and inability to revascularize were all reviewed in detail with the patient's mother. The patient was then put under general anesthesia by the Department of Anesthesiology at [facility]. The right groin was prepped and draped in the usual sterile fashion. Thereafter using modified Seldinger technique, transfemoral access into the right common femoral artery was  obtained without difficulty. Over a 0.035 inch guidewire an 8 [place] Pinnacle 25 cm sheath was inserted. Through this, and also over a 0.035 inch guidewire a 5 [place] JB 1 catheter was advanced to the aortic arch region and selectively positioned in the right common carotid artery. FINDINGS: Right common carotid arteriogram demonstrates the right external carotid artery and its major branches to be widely patent. The right internal carotid artery at the bulb to the cranial skull base is widely patent. The petrous, cavernous and the supraclinoid segments are widely patent. The right posterior communicating artery is seen opacifying the right posterior cerebral distribution. The right middle cerebral artery demonstrates complete occlusion just distal to the origin of the anterior temporal branch. The right anterior cerebral artery opacifies into the capillary and venous phases with delayed retrograde opacification of the middle perisylvian branches of the superior division via the right anterior cerebral artery pial collaterals. PROCEDURE: The diagnostic catheter in the right common carotid artery was then exchanged over a 0.035 inch 300 cm Rosen exchange guidewire for a 90 cm 087 balloon guide catheter which had been prepped with 50% contrast and 50% heparinized saline infusion. The balloon guide catheter was  then advanced to the mid right ICA over the exchange micro guidewire without difficulty. The guidewire was removed. Good aspiration was obtained from the hub of the balloon guide catheter in the mid right ICA. Gentle control arteriogram performed through this demonstrated no evidence of spasms, dissections or of intraluminal filling defects. An intracranial examination revealed no change in the occluded right middle cerebral artery. Using an 0.014 inch standard Synchro micro guidewire with a J-tip configuration, a combination of an 014 inch 160 cm microcatheter inside of an 071 132 cm Zoom aspiration catheter was advanced to the supraclinoid right ICA. The micro guidewire was then gently manipulated to advance to the proximal portion of the occluded right middle cerebral artery. The micro guidewire, and the microcatheter were gently retrieved as the 071 Zoom aspiration catheter was advanced into the occluded right middle cerebral artery. Constant aspiration was then applied into the Zoom aspiration catheter with aspiration device for approximately 3 minutes. During this gentle manipulation was also performed to advance the Zoom aspiration catheter. With proximal flow arrest in the right internal carotid artery, Zoom aspiration catheter were retrieved and removed. Following flow reversal in the right internal carotid artery, an arteriogram performed through the balloon guide in the right internal carotid artery now demonstrated revascularization of the right middle cerebral artery and superior divisions with occlusion of a superior division branch in the distal M2 region. A TICI 2C revascularization was achieved. Extensive vasospasm was also noted in the right middle cerebral artery M1 segment and the origin of the superior division, which responded to 5 25 mcg aliquots of nitroglycerin intra-arterially, and of 2.5 mg of verapamil IA. Over a 0.14 inch standard Synchro micro guidewire, inside of an 021 160 cm  microcatheter and 55 cm 132 Zoom aspiration catheter combination was then advanced to supraclinoid right ICA. The micro guidewire was then gently manipulated with a torque device and advanced through the occluded superior division branch into the distal M2 region followed by the microcatheter. The guidewire was removed. Free aspiration of blood was noted at the hub of the microcatheter. A 3 mm x 40 mm Solitaire X retrieval device was then advanced to the distal end of the microcatheter and deployed in the usual manner. The Zoom aspiration catheter was advanced into the origin of the superior  division. With proximal flow arrest in the right internal carotid artery, constant aspiration was now applied at the hub of the balloon guide catheter with a 20 mL syringe, and aspiration device at the hub of the Zoom aspiration catheter for approximately 3 minutes. Thereafter, the combination of the retrieval device the microcatheter and Zoom aspiration catheter were retrieved and removed. Following reversal of flow arrest, a control arteriogram performed through the balloon guide in the right internal carotid artery now demonstrated complete revascularization of the previously occluded superior division, with a TICI 3 revascularization. Spasm was noted at the proximal superior division. This gradually responded to more intra arterial nitroglycerin over the next 20 minutes. A final control arteriogram performed through the balloon guide catheter in the right internal carotid artery demonstrated significantly improved flow through the proximal right middle cerebral superior division. No evidence of intraluminal filling defects, or of dissection flaps were noted. A final control arteriogram performed through the balloon guide in the right common carotid demonstrated wide patency of the right internal carotid artery extra cranially and intracranially with TICI 3 revascularization. Right ACA and right PCA remained normally patent.  Balloon guide was removed. The 8 French Pinnacle sheath in the right groin was replaced with an 8 French Angio-Seal closure device for hemostasis. Distal pulses remained palpable in both feet unchanged. A CT of the brain demonstrated no evidence of intracranial hemorrhage. Patient was then extubated without difficulty. Upon recovery, the patient was able to bend the left knee and raise the left forearm against gravity. She responded appropriately to simple commands. Her pupils were equal. She denied any headaches or nausea. She was transferred to the PACU and then neuro ICU post thrombectomy care. IMPRESSION: Status post endovascular complete revascularization of occluded right middle cerebral artery M1 segment with 1 pass with contact aspiration, and 1 pass with a 3 mm x 40 mm Solitaire X retrieval device and contact aspiration achieving a TICI 3 revascularization. PLAN: Follow-up in the clinic 2-4 weeks post discharge. Electronically Signed   By: Julieanne Cotton M.D.   On: 10/14/2020 12:05    Labs:  Basic Metabolic Panel: No results for input(s): NA, K, CL, CO2, GLUCOSE, BUN, CREATININE, CALCIUM, MG, PHOS in the last 168 hours.   CBC: Recent Labs  Lab 10/21/20 0805  WBC 8.4  NEUTROABS 5.0  HGB 11.6*  HCT 34.9*  MCV 86.8  PLT 328    CBG: No results for input(s): GLUCAP in the last 168 hours.  Family history.  Mother with hypertension epilepsy anxiety disorder as well as migraines.  Maternal grandmother with diabetes and CVA.  Denies any breast cancer colon cancer or esophageal cancer  Brief HPI:   ANAYI BRICCO is a 25 y.o. right-handed female with history of morbid obesity BMI 29.92 migraine headaches tobacco use.  Per chart review independent prior to admission working full-time.  She lives with her boyfriend and 24-year-old son.  She has a mother with good support.  Presented 10/11/2020 with acute onset of left-sided weakness dysarthria and facial droop.  Cranial CT scan showed  hypodensity in the right anterior temporal lobe concerning for infarction.  CT angiogram head and neck thrombus in the distal right M1 segment with near complete occlusion.  11 cc infarct core in the right corona radiata.  Admission chemistries unremarkable urine drug screen positive for marijuana as well as benzos.  Patient underwent mechanical thrombectomy 10/11/2020 with revascularization per interventional radiology requiring repeat thrombectomy 24 hours later for occluded dominant superior division  of the right MCA achieving revascularization.  Latest MRI and imaging 10/13/2020 showed acute anterior right MCA territory infarct predominantly involving the right caudate putamen and anterior limb of the right internal capsule, insula, operculum and overlying right frontal lobe.  A few punctate acute infarcts in the right parietal lobe.  Associated cytotoxic edema with slight 2-3 mm leftward midline shift at the foramen of [place].  Probable small amount of hemorrhage in the posterior right frontal lobe/insular region.  Echocardiogram with ejection fraction of 60 to 65% no wall motion abnormalities.  Hyper coag work-up negative.  TEE was pending.  Patient was cleared to begin aspirin for CVA prophylaxis.  Lower extremity Dopplers negative.  Tolerating a regular diet.  Therapy evaluations completed due to patient's left-sided weakness and dysarthria was admitted for a comprehensive rehab program.   Hospital Course: Kristen Grant was admitted to rehab 10/17/2020 for inpatient therapies to consist of PT, ST and OT at least three hours five days a week. Past admission physiatrist, therapy team and rehab RN have worked together to provide customized collaborative inpatient rehab.  Pertaining to patient's right MCA infarct predominantly right caudate putamen anterior limb right internal capsule secondary to right M1 occlusion status post revascularization mechanical thrombectomy requiring repeat thrombectomy 10/12/2020 for  occluded dominant superior division of right MCA.  Patient would follow-up with neurology service maintained on aspirin therapy.  Mood stabilization with low-dose Xanax.  She was participating with therapies.  Lipitor ongoing for hyperlipidemia.  During hospital work-up TEE noted small PFO follow-up outpatient.  Urine drug screen was positive for marijuana patient did receive counsel regarding cessation of illicit drug products as well as nicotine.  Noted obesity BMI 29.92 dietary follow-up.   Blood pressures were monitored on TID basis and controlled     Rehab course: During patient's stay in rehab weekly team conferences were held to monitor patient's progress, set goals and discuss barriers to discharge. At admission, patient required moderate assist sit to supine min mod assist sit to stand moderate assist 3 feet 2 person hand-held assist  Physical exam.  Blood pressure 105/61 pulse 77 temperature 98.7 respirations 18 oxygen saturations 98% room air Constitutional.  No acute distress HEENT Head.  Normocephalic and atraumatic Eyes.  Pupils round and reactive to light no discharge.nystagmus Neck.  Supple nontender no JVD without thyromegaly Cardiac regular rate rhythm extra sounds or murmur heard Abdomen.  Soft nontender positive bowel sounds without rebound Respiratory effort normal no respiratory distress without wheeze Skin.  Warm and dry Neurologic.  Patient alert makes eye contact with examiner.  Fair insight and awareness.  Provides name and age.  Speech was dysarthric but intelligible.  Mild left central seventh.  Mild left inattention.  Senses pain and light touch in all 4.  Left upper extremity 1/5 proximal and distal left lower extremity 3-3/5 proximal to distal.  No resting tone  He/She  has had improvement in activity tolerance, balance, postural control as well as ability to compensate for deficits. He/She has had improvement in functional use RUE/LUE  and RLE/LLE as well as  improvement in awareness.  Patient ambulates 200 feet without assistive device increasing up to 400 feet with rest breaks.  Demonstrated minimal intermittent variation in path requiring verbal cues throughout for increasing hip knee ankle flexion.  She completed self-feeding sitting edge of bed.  She was unable to complete toilet transfers minimal assist to the toilet.  She was able to complete toilet hygiene and clothing management min guard assist.  She could ambulate with occupational therapy to the day room with minimal guard no device to start sessions.  Speech therapy administered [employer] mental status testing scored 30/30.  Patient exhibited improved selective attention able to complete task with minimal redirection.  For family teaching completed plan discharged home       Disposition: Discharged home    Diet: Regular  Special Instructions: No driving smoking alcohol or illicit drug use  Medications at discharge 1.  Tylenol as needed 2.  Aspirin 81 mg p.o. daily 3.  Lipitor 80 mg p.o. daily 4.  Baclofen 5 mg p.o. nightly 5.  Colace 100 mg p.o. twice daily hold for loose stools 6.  MiraLAX daily  30-35 minutes were spent completing discharge summary and discharge planning  Discharge Instructions     Ambulatory referral to Neurology   Complete by: As directed    An appointment is requested in approximately: 4 weeks right MCA infarction   Ambulatory referral to Physical Medicine Rehab   Complete by: As directed    Moderate complexity follow-up 1 to 2 weeks right MCA infarction        Follow-up Information     Kirsteins, Victorino Sparrow, MD Follow up.   Specialty: Physical Medicine and Rehabilitation Why: Office to call for appointment Contact information: [address] [place] [address] [phone]         Yates Decamp, MD Follow up on 11/13/2020.   Specialty: Cardiology Why: Please call to schedule an office visit after discharge and  discuss regarding PFO and stroke. Dr. Marlis Edelson request.  10/12/ @ 1:30 PM. Bring all medications Contact information: [address] [place] [address] [phone]                 Signed: Mcarthur Rossetti Abdulah Iqbal 10/25/2020, 6:18 AM

## 2020-10-24 NOTE — Progress Notes (Signed)
PROGRESS NOTE   Subjective/Complaints:  No issues overnite , discussed d/c date No pains, still working on functional use LUE   ROS: Denies CP, SOB, N/V/D  Objective:   No results found. Recent Labs    10/21/20 0805  WBC 8.4  HGB 11.6*  HCT 34.9*  PLT 328    No results for input(s): NA, K, CL, CO2, GLUCOSE, BUN, CREATININE, CALCIUM in the last 72 hours.   Intake/Output Summary (Last 24 hours) at 10/24/2020 0734 Last data filed at 10/23/2020 1352 Gross per 24 hour  Intake 120 ml  Output --  Net 120 ml         Physical Exam: Vital Signs Blood pressure (!) 111/59, pulse 69, temperature 99.6 F (37.6 C), temperature source Oral, resp. rate 18, height 5\' 9"  (1.753 m), weight 88.1 kg, SpO2 100 %.  General: No acute distress Mood and affect are appropriate Heart: Regular rate and rhythm no rubs murmurs or extra sounds Lungs: Clear to auscultation, breathing unlabored, no rales or wheezes Abdomen: Positive bowel sounds, soft nontender to palpation, nondistended Extremities: No clubbing, cyanosis, or edema Skin: No evidence of breakdown, no evidence of rash  Motor: Right upper extremity/right lower extremity: 5/5 proximal distal Left lower extremity: 4/5 proximal distal Left upper extremity: 3/5 delt bi tri finger flex/ext  Assessment/Plan: 1. Functional deficits which require 3+ hours per day of interdisciplinary therapy in a comprehensive inpatient rehab setting. Physiatrist is providing close team supervision and 24 hour management of active medical problems listed below. Physiatrist and rehab team continue to assess barriers to discharge/monitor patient progress toward functional and medical goals  Care Tool:  Bathing    Body parts bathed by patient: Left arm, Chest, Abdomen, Front perineal area, Buttocks, Right upper leg, Left upper leg, Right lower leg, Left lower leg, Face   Body parts bathed by  helper: Right arm     Bathing assist Assist Level: Contact Guard/Touching assist     Upper Body Dressing/Undressing Upper body dressing   What is the patient wearing?: Pull over shirt    Upper body assist Assist Level: Supervision/Verbal cueing    Lower Body Dressing/Undressing Lower body dressing      What is the patient wearing?: Pants, Underwear/pull up     Lower body assist Assist for lower body dressing: Minimal Assistance - Patient > 75%     Toileting Toileting    Toileting assist Assist for toileting: Minimal Assistance - Patient > 75%     Transfers Chair/bed transfer  Transfers assist     Chair/bed transfer assist level: Minimal Assistance - Patient > 75%     Locomotion Ambulation   Ambulation assist      Assist level: Minimal Assistance - Patient > 75% Assistive device: No Device Max distance: 150'   Walk 10 feet activity   Assist     Assist level: Moderate Assistance - Patient - 50 - 74% Assistive device: Hand held assist   Walk 50 feet activity   Assist Walk 50 feet with 2 turns activity did not occur: Safety/medical concerns         Walk 150 feet activity   Assist Walk 150 feet activity  did not occur: Safety/medical concerns         Walk 10 feet on uneven surface  activity   Assist Walk 10 feet on uneven surfaces activity did not occur: Safety/medical concerns         Wheelchair     Assist Is the patient using a wheelchair?: No             Wheelchair 50 feet with 2 turns activity    Assist            Wheelchair 150 feet activity     Assist          Blood pressure (!) 111/59, pulse 69, temperature 99.6 F (37.6 C), temperature source Oral, resp. rate 18, height 5\' 9"  (1.753 m), weight 88.1 kg, SpO2 100 %.  Medical Problem List and Plan: 1.  Left-sided hemiparesis with dysarthria secondary to right MCA right MCA territory infarct predominantly right caudate putamen anterior limb  right internal capsule and overlying right frontal lobe secondary to right M1 occlusion status post revascularization mechanical thrombectomy 10/11/2020 as well as requiring repeat thrombectomy 10/12/2020 for occluded dominant superior division of right MCA. CVA of cryptogenic origin. TEE really unremarkable. ? Small PFO  Continue CIR  Tent d/c 9/24 to home with mom and boyfriend   PRAFO/WHO ordered  Baclofen 5 nightly started on 9/17- has tight finger flexors  2.  Antithrombotics: -DVT/anticoagulation:  Mechanical: Sequential compression devices, below knee Bilateral lower extremities             -antiplatelet therapy: Aspirin 81 mg daily 3. Pain Management: Tylenol as needed 4. Mood/anxiety : Team to Provide emotional support             -antipsychotic agents: N/A             -added low dose xanax for anxiety 5. Neuropsych: This patient is capable of making decisions on her own behalf. 6. Skin/Wound Care: Routine skin checks 7. Fluids/Electrolytes/Nutrition: Routine in and outs  8.  Hyperlipidemia: Lipitor 9.  Urine drug screen positive marijuana.  Counseling 10.  Obesity.  BMI 29.92.  Dietary follow-up  11. Constipation order prn laxative 12.  PFO looks smaller on TEE than on TTE, unlikely to require PFO closure, will f/u with neuro as OP after inpt rehab  14.  Leukocytosis- resolved   WBCs 12.5 on 9/16, now normal at 8.6K 15.  Anemia mild  , normocytic, pt denies having menstrual period    LOS: 7 days A FACE TO FACE EVALUATION WAS PERFORMED  10/16 10/24/2020, 7:34 AM

## 2020-10-25 DIAGNOSIS — D649 Anemia, unspecified: Secondary | ICD-10-CM

## 2020-10-25 NOTE — Progress Notes (Addendum)
Patient ID: Kristen Grant, female   DOB: 1995-05-02, 25 y.o.   MRN: [id number]  New Patient Visit with Rodman Pickle on Monday, December 16, 2020 at 8:20 AM with [facility]  [address] [place] [address] [phone]  Lavera Guise, [place] [phone]

## 2020-10-25 NOTE — Progress Notes (Signed)
PROGRESS NOTE   Subjective/Complaints: Patient seen sitting up in her chair, working with therapies.  She states she slept well overnight.  She is looking forward to discharge tomorrow.  She notes improvement in strength.  ROS: Denies CP, SOB, N/V/D  Objective:   No results found. No results for input(s): WBC, HGB, HCT, PLT in the last 72 hours.  No results for input(s): NA, K, CL, CO2, GLUCOSE, BUN, CREATININE, CALCIUM in the last 72 hours.   Intake/Output Summary (Last 24 hours) at 10/25/2020 1316 Last data filed at 10/25/2020 0700 Gross per 24 hour  Intake 462 ml  Output --  Net 462 ml         Physical Exam: Vital Signs Blood pressure (!) 104/56, pulse 76, temperature 99 F (37.2 C), temperature source Oral, resp. rate 18, height 5\' 9"  (1.753 m), weight 88.1 kg, SpO2 98 %. Constitutional: No distress . Vital signs reviewed. HENT: Normocephalic.  Atraumatic. Eyes: EOMI. No discharge. Cardiovascular: No JVD.  RRR. Respiratory: Normal effort.  No stridor.  Bilateral clear to auscultation. GI: Non-distended.  BS +. Skin: Warm and dry.  Intact. Psych: Flat.  Normal behavior. Musc: No edema in extremities.  No tenderness in extremities. Neuro: Alert Motor: Left lower extremity: 4+/5 proximal distal Left upper extremity: 3-/5 delt bi tri finger flex/ext  Assessment/Plan: 1. Functional deficits which require 3+ hours per day of interdisciplinary therapy in a comprehensive inpatient rehab setting. Physiatrist is providing close team supervision and 24 hour management of active medical problems listed below. Physiatrist and rehab team continue to assess barriers to discharge/monitor patient progress toward functional and medical goals  Care Tool:  Bathing    Body parts bathed by patient: Left arm, Chest, Abdomen, Front perineal area, Buttocks, Right upper leg, Left upper leg, Right lower leg, Left lower leg, Face,  Right arm   Body parts bathed by helper: Right arm     Bathing assist Assist Level: Supervision/Verbal cueing     Upper Body Dressing/Undressing Upper body dressing   What is the patient wearing?: Pull over shirt    Upper body assist Assist Level: Set up assist    Lower Body Dressing/Undressing Lower body dressing      What is the patient wearing?: Pants, Underwear/pull up     Lower body assist Assist for lower body dressing: Supervision/Verbal cueing     Toileting Toileting    Toileting assist Assist for toileting: Supervision/Verbal cueing     Transfers Chair/bed transfer  Transfers assist     Chair/bed transfer assist level: Independent     Locomotion Ambulation   Ambulation assist      Assist level: Supervision/Verbal cueing Assistive device: No Device Max distance: 150'   Walk 10 feet activity   Assist     Assist level: Independent Assistive device: No Device   Walk 50 feet activity   Assist Walk 50 feet with 2 turns activity did not occur: Safety/medical concerns  Assist level: Supervision/Verbal cueing Assistive device: No Device    Walk 150 feet activity   Assist Walk 150 feet activity did not occur: Safety/medical concerns  Assist level: Supervision/Verbal cueing Assistive device: No Device    Walk  10 feet on uneven surface  activity   Assist Walk 10 feet on uneven surfaces activity did not occur: Safety/medical concerns   Assist level: Supervision/Verbal cueing     Wheelchair     Assist Is the patient using a wheelchair?: No             Wheelchair 50 feet with 2 turns activity    Assist            Wheelchair 150 feet activity     Assist          Blood pressure (!) 104/56, pulse 76, temperature 99 F (37.2 C), temperature source Oral, resp. rate 18, height 5\' 9"  (1.753 m), weight 88.1 kg, SpO2 98 %.  Medical Problem List and Plan: 1.  Left-sided hemiparesis with dysarthria secondary  to right MCA right MCA territory infarct predominantly right caudate putamen anterior limb right internal capsule and overlying right frontal lobe secondary to right M1 occlusion status post revascularization mechanical thrombectomy 10/11/2020 as well as requiring repeat thrombectomy 10/12/2020 for occluded dominant superior division of right MCA. CVA of cryptogenic origin. TEE really unremarkable. ? Small PFO  Continue CIR, patient family education Plan for DC tomorrow PRAFO/WHO nightly  Baclofen 5 nightly started on 9/17- has tight finger flexors  2.  Antithrombotics: -DVT/anticoagulation:  Mechanical: Sequential compression devices, below knee Bilateral lower extremities             -antiplatelet therapy: Aspirin 81 mg daily 3. Pain Management: Tylenol as needed 4. Mood/anxiety : Team to Provide emotional support             -antipsychotic agents: N/A             -added low dose xanax for anxiety 5. Neuropsych: This patient is capable of making decisions on her own behalf. 6. Skin/Wound Care: Routine skin checks 7. Fluids/Electrolytes/Nutrition: Routine in and outs  8.  Hyperlipidemia: Lipitor 9.  Urine drug screen positive marijuana.  Counseling 10.  Obesity.  BMI 29.92.  Dietary follow-up  11. Constipation order prn laxative  Improving 12.  PFO looks smaller on TEE than on TTE, unlikely to require PFO closure, will f/u with neuro as OP after inpt rehab  14.  Leukocytosis- resolved   WBCs normal at 8.6K 15.  Anemia Hemoglobin 11.6 on 9/19  LOS: 8 days A FACE TO FACE EVALUATION WAS PERFORMED  Arabia Nylund 10/19 10/25/2020, 1:16 PM

## 2020-10-25 NOTE — Plan of Care (Signed)
  Problem: RH Swallowing Goal: LTG Patient will consume least restrictive diet using compensatory strategies with assistance (SLP) Description: LTG:  Patient will consume least restrictive diet using compensatory strategies with assistance (SLP) Outcome: Completed/Met   Problem: RH Problem Solving Goal: LTG Patient will demonstrate problem solving for (SLP) Description: LTG:  Patient will demonstrate problem solving for basic/complex daily situations with cues  (SLP) Outcome: Completed/Met   Problem: RH Attention Goal: LTG Patient will demonstrate this level of attention during functional activites (SLP) Description: LTG:  Patient will will demonstrate this level of attention during functional activites (SLP) Outcome: Completed/Met   Problem: RH Awareness Goal: LTG: Patient will demonstrate awareness during functional activites type of (SLP) Description: LTG: Patient will demonstrate awareness during functional activites type of (SLP) Outcome: Completed/Met

## 2020-10-25 NOTE — Progress Notes (Signed)
Occupational Therapy Session Note  Patient Details  Name: Kristen Grant MRN: [id number] Date of Birth: 01-Feb-1996  Today's Date: 10/25/2020 OT Individual Time: 3532-9924 OT Individual Time Calculation (min): 54 min    Short Term Goals: Week 1:  OT Short Term Goal 1 (Week 1): Pt will complete UB dressing with supervision for pullover shirt only, OT Short Term Goal 2 (Week 1): Pt will complete LB bathing with min guard assist sit to stand for two consecutive sessions. OT Short Term Goal 3 (Week 1): Pt will complete LB dressing with min assist for two consecutive sessions. OT Short Term Goal 4 (Week 1): Pt will complete self AAROM exercises for the LUE following handout with min guard assist.  Skilled Therapeutic Interventions/Progress Updates:    Pt's mother in for education.  Had her complete RUE AROM exercises in supine with handout for reference for shoulder flexion and elbow extension.  She was able to return demonstrate.  She transitioned to sitting with independence and then completed AROM exercises for elbow flexion, and wrist/digit extension.  Handout provided for reference.  Also issued handout for activities to be completed at home including weightbearing over the LLE on the edge of the bed or couch with dycem provided, opening and closing bottles, folding towels, and washing the countertops.  They both voiced understanding.  Next, she was able to donn her shoes with setup and then ambulate to the tub room where she completed step in shower transfer with supervision.  Pt's mother reports that she has a tub bench for use at home as well.  Next, transitioned to the therapy gym at supervision level and no device for work on quadriped on weightbearing activities for the LUE as well as work on fall recovery getting up from the floor.  She was able to transfer from the floor with supervision.  Finished session with return to the room and transfer back to the EOB to sit in preparation for SLP  session.  All questions answered with call button in reach and her mother present.    Therapy Documentation Precautions:  Precautions Precautions: Fall Precaution Comments: left hemiparesis Restrictions Weight Bearing Restrictions: No  Pain: Pain Assessment Pain Scale: Faces Pain Score: 0-No pain Faces Pain Scale: No hurt ADL: See Care Tool Section for some details of mobility and selfcare   Therapy/Group: Individual Therapy  Denetta Fei OTR/L 10/25/2020, 12:34 PM

## 2020-10-25 NOTE — Progress Notes (Signed)
Occupational Therapy Discharge Summary  Patient Details  Name: Kristen Grant MRN: [id number] Date of Birth: [birth year]  Today's Date: 10/25/2020 OT Individual Time: 1357-1446 OT Individual Time Calculation (min): 49 min   Session Note: Pt was able to complete supine to sit with independence and then donned her socks and shoes with setup.  She was able to then complete functional mobility without an assistance device to the ortho gym.  She was able to complete 3 sets of 2 mins using the LUE with use of the ergonometer.  First 2 sets were completed with min guard assist peddling forward with the final set peddling backwards at min assist. Ace bandage was placed over the left hand to help maintain grip. She then transitioned to the main therapy gym where she completed Box and Blocks test with 63 blocks completed with the right and 16 blocks with the left.  She then worked on picking up checkers one at a time with the LUE at knee level and placing in the [place]  slightly higher.  Slight increased shoulder hike noted with forward lean when attempting to reach to pick up or place the checker.  Rest break completed after 10-15 checkers. Finished session with return to the room and pt left laying in the bed with the call button and phone in reach.  Safety belt in place as well.    Patient has met 13 of 13 long term goals due to improved activity tolerance, improved balance, postural control, ability to compensate for deficits, functional use of  LEFT upper and LEFT lower extremity, improved awareness, and improved coordination.  Patient to discharge at overall Supervision level.  Patient's care partner is independent to provide the necessary physical and cognitive assistance at discharge.    Reasons goals not met: NA  Recommendation:  Patient will benefit from ongoing skilled OT services in outpatient setting to continue to advance functional skills in the area of BADL, iADL, Vocation, and Reduce  care partner burden.  Pt will benefit from continued outpatient OT to increase LUE functional use for return to independent level for selfcare tasks as well as in preparation for return to work.    Equipment: No equipment provided  Reasons for discharge: treatment goals met and discharge from hospital  Patient/family agrees with progress made and goals achieved: Yes  OT Discharge Precautions/Restrictions  Precautions Precautions: Fall Precaution Comments: left hemiparesis Restrictions Weight Bearing Restrictions: No   Pain Pain Assessment Pain Scale: Faces Pain Score: 0-No pain ADL ADL Eating: Modified independent Where Assessed-Eating: Edge of bed Grooming: Modified independent Where Assessed-Grooming: Standing at sink Upper Body Bathing: Supervision/safety Where Assessed-Upper Body Bathing: Chair, Shower Lower Body Bathing: Supervision/safety Where Assessed-Lower Body Bathing: Chair Upper Body Dressing: Supervision/safety Where Assessed-Upper Body Dressing: Edge of bed Lower Body Dressing: Supervision/safety Where Assessed-Lower Body Dressing: Edge of bed Toileting: Supervision/safety Where Assessed-Toileting: Glass blower/designer: Distant supervision Armed forces technical officer Method: Counselling psychologist: Raised toilet seat Tub/Shower Transfer: Close supervison Clinical cytogeneticist Method: Optometrist: Civil engineer, contracting with back Social research officer, government: Close supervision Social research officer, government Method: Heritage manager: Civil engineer, contracting without back Vision Baseline Vision/History: 0 No visual deficits Patient Visual Report: No change from baseline Vision Assessment?: No apparent visual deficits Ocular Range of Motion: Within Functional Limits Alignment/Gaze Preference: Within Defined Limits Tracking/Visual Pursuits: Able to track stimulus in all quads without difficulty Convergence: Within functional limits Visual Fields: No  apparent deficits Perception  Perception: Within Functional Limits Praxis Praxis:  Intact Cognition Overall Cognitive Status: Impaired/Different from baseline Arousal/Alertness: Awake/alert Year: 2022 Month: September Day of Week: Correct Attention: Divided Focused Attention: Appears intact Sustained Attention: Appears intact Alternating Attention: Appears intact Divided Attention: Impaired Divided Attention Impairment: Verbal complex;Functional complex Memory: Appears intact Immediate Memory Recall: Sock;Blue;Bed Memory Recall Sock: Without Cue Memory Recall Blue: Without Cue Memory Recall Bed: Without Cue Awareness: Impaired Awareness Impairment: Anticipatory impairment Safety/Judgment: Appears intact Sensation Sensation Light Touch: Appears Intact Hot/Cold: Appears Intact Proprioception: Appears Intact Stereognosis: Appears Intact Coordination Gross Motor Movements are Fluid and Coordinated: No Fine Motor Movements are Fluid and Coordinated: No Coordination and Movement Description: Pt still with moderate LUE hemiparesis, needs min to mod assist for use actively during selfcare tasks for pulling up clothing. Motor  Motor Motor: Hemiplegia;Abnormal tone Motor - Discharge Observations: L hemiparesis UE >LE Mobility  Bed Mobility Bed Mobility: Supine to Sit;Sit to Supine;Rolling Right;Rolling Left Rolling Right: Independent Rolling Left: Independent Supine to Sit: Independent Sit to Supine: Independent Transfers Sit to Stand: Independent Stand to Sit: Independent  Trunk/Postural Assessment  Cervical Assessment Cervical Assessment: Within Functional Limits Thoracic Assessment Thoracic Assessment: Within Functional Limits Lumbar Assessment Lumbar Assessment: Within Functional Limits  Balance Balance Balance Assessed: Yes Static Sitting Balance Static Sitting - Balance Support: Feet supported Static Sitting - Level of Assistance: 7: Independent Dynamic  Sitting Balance Dynamic Sitting - Balance Support: During functional activity Dynamic Sitting - Level of Assistance: 7: Independent Static Standing Balance Static Standing - Balance Support: During functional activity Static Standing - Level of Assistance: 7: Independent Dynamic Standing Balance Dynamic Standing - Balance Support: During functional activity Dynamic Standing - Level of Assistance: 5: Stand by assistance Extremity/Trunk Assessment RUE Assessment RUE Assessment: Within Functional Limits LUE Assessment LUE Assessment: Exceptions to [facility] Passive Range of Motion (PROM) Comments: WFLs for all joints Active Range of Motion (AROM) Comments: Pt with Brunnstrum stage IV movement in the left arm and and stage V in the hand. General Strength Comments: She is able to use the LUE as a gross assist with supervision, but needs min to mod to use at a diminshed level.  Slight tone noted in the digit flexors but she is able to extend her fingers to 90% of normal ROM.   Latrail Pounders OTR/L 10/25/2020, 4:39 PM

## 2020-10-25 NOTE — Progress Notes (Signed)
Patient ID: Kristen Grant, female   DOB: 28-May-1995, 25 y.o.   MRN: [id number]  [facility] currently not accepting new patient. Patient referred to [facility].  [place],  [place] [phone]

## 2020-10-25 NOTE — Progress Notes (Signed)
Physical Therapy Discharge Summary  Patient Details  Name: Kristen Grant MRN: [id number] Date of Birth: [birth year]  Today's Date: 10/25/2020 PT Individual Time: 1100-1155 PT Individual Time Calculation (min): 55 min    Patient has met 10 of 10 long term goals due to improved activity tolerance, improved balance, improved postural control, increased strength, increased range of motion, decreased pain, ability to compensate for deficits, functional use of  left upper extremity and left lower extremity, improved attention, improved awareness, and improved coordination.  Patient to discharge at an ambulatory level Modified Independent.   Patient's care partner is independent to provide the necessary  physical  assistance at discharge.   Recommendation:  Patient will benefit from ongoing skilled PT services in outpatient setting to continue to advance safe functional mobility, address ongoing impairments in balance, coordination, endurance, and minimize fall risk.  Equipment: No equipment provided  Reasons for discharge: treatment goals met and discharge from hospital  Patient/family agrees with progress made and goals achieved: Yes  PT Discharge Precautions/Restrictions Precautions Precautions: Fall Precaution Comments: left hemiparesis Restrictions Weight Bearing Restrictions: No Pain Pain Assessment Pain Scale: 0-10 Pain Score: 0-No pain Faces Pain Scale: No hurt Pain Interference Pain Interference Pain Effect on Sleep: 0. Does not apply - I have not had any pain or hurting in the past 5 days Pain Interference with Therapy Activities: 0. Does not apply - I have not received rehabilitationtherapy in the past 5 days Pain Interference with Day-to-Day Activities: 1. Rarely or not at all Vision/Perception  Vision - History Ability to See in Adequate Light: 0 Adequate Perception Perception: Within Functional Limits Praxis Praxis: Intact  Cognition Overall Cognitive Status:  Impaired/Different from baseline Arousal/Alertness: Awake/alert Orientation Level: Oriented X4 Year: 2022 Month: September Day of Week: Correct Attention: Divided Focused Attention: Appears intact Sustained Attention: Appears intact Alternating Attention: Appears intact Divided Attention: Impaired Divided Attention Impairment: Verbal complex;Functional complex Memory: Appears intact Awareness: Impaired Awareness Impairment: Anticipatory impairment Problem Solving: Appears intact Safety/Judgment: Appears intact Sensation Sensation Hot/Cold: Appears Intact Proprioception: Appears Intact Stereognosis: Appears Intact Coordination Gross Motor Movements are Fluid and Coordinated: No Fine Motor Movements are Fluid and Coordinated: No Heel Shin Test: slightly diminished AROM L LE Motor  Motor Motor: Hemiplegia;Abnormal tone;Abnormal postural alignment and control Motor - Skilled Clinical Observations: L hemiparesis Motor - Discharge Observations: L hemiparesis UE >LE  Mobility Bed Mobility Bed Mobility: Supine to Sit;Sit to Supine;Rolling Right;Rolling Left Rolling Right: Independent Rolling Left: Independent Supine to Sit: Independent Sit to Supine: Independent Transfers Transfers: Sit to Stand;Stand to Sit;Stand Pivot Transfers Sit to Stand: Independent Stand to Sit: Independent Stand Pivot Transfers: Independent Locomotion  Gait Ambulation: Yes Gait Assistance: Supervision/Verbal cueing Gait Distance (Feet): 300 Feet Assistive device: None Gait Gait: Yes Gait Pattern: Impaired Gait Pattern: Lateral hip instability Gait velocity: Decreased Stairs / Additional Locomotion Stairs: Yes Stairs Assistance: Supervision/Verbal cueing Stair Management Technique: No rails Number of Stairs: 12 Height of Stairs: 6 Ramp: Supervision/Verbal cueing Curb: Supervision/Verbal cueing Wheelchair Mobility Wheelchair Mobility: No  Trunk/Postural Assessment  Cervical  Assessment Cervical Assessment: Within Functional Limits Thoracic Assessment Thoracic Assessment: Within Functional Limits Lumbar Assessment Lumbar Assessment: Exceptions to [facility] Postural Control Postural Control: Deficits on evaluation Righting Reactions: delayed and inadequate Protective Responses: delayed and inadequate  Balance Balance Balance Assessed: Yes Standardized Balance Assessment Standardized Balance Assessment: Berg Balance Test Berg Balance Test Sit to Stand: Able to stand without using hands and stabilize independently Standing Unsupported: Able to stand safely 2 minutes Sitting with  Back Unsupported but Feet Supported on Floor or Stool: Able to sit safely and securely 2 minutes Stand to Sit: Sits safely with minimal use of hands Transfers: Able to transfer safely, minor use of hands Standing Unsupported with Eyes Closed: Able to stand 10 seconds safely Standing Ubsupported with Feet Together: Able to place feet together independently and stand for 1 minute with supervision From Standing, Reach Forward with Outstretched Arm: Can reach confidently >25 cm (10") From Standing Position, Pick up Object from Floor: Able to pick up shoe safely and easily From Standing Position, Turn to Look Behind Over each Shoulder: Looks behind from both sides and weight shifts well Turn 360 Degrees: Able to turn 360 degrees safely one side only in 4 seconds or less Standing Unsupported, Alternately Place Feet on Step/Stool: Able to stand independently and safely and complete 8 steps in 20 seconds Standing Unsupported, One Foot in Front: Able to plae foot ahead of the other independently and hold 30 seconds Standing on One Leg: Able to lift leg independently and hold 5-10 seconds Total Score: 52 Static Sitting Balance Static Sitting - Balance Support: Feet supported Static Sitting - Level of Assistance: 7: Independent Dynamic Sitting Balance Dynamic Sitting - Balance Support: During  functional activity Dynamic Sitting - Level of Assistance: 7: Independent Static Standing Balance Static Standing - Balance Support: During functional activity Static Standing - Level of Assistance: 7: Independent Dynamic Standing Balance Dynamic Standing - Balance Support: During functional activity Dynamic Standing - Level of Assistance: 6: Modified independent (Device/Increase time) Extremity Assessment      RLE Assessment RLE Assessment: Within Functional Limits LLE Assessment LLE Assessment: Exceptions to [facility] LLE Strength Left Hip Flexion: 4-/5 Left Hip ABduction: 4-/5 Left Hip ADduction: 4-/5 Left Knee Flexion: 3+/5 Left Knee Extension: 4-/5 Left Ankle Dorsiflexion: 4-/5 Left Ankle Plantar Flexion: 4-/5  Patient received sitting up in bed, agreeable to PT. Mother not present for family ed. She denies pain. Patient able to complete all bed mobility independently and don shoes with supervision. Patient ambulated to therapy gym with no AD and distant supervision. Minimal lateral instability noted, but no LOB. Patient scored a 52/56 on the berg balance scale, which is a significant improvement from eval with a score of 8/56. Patient completing dc assessment as noted above. NuStep x7 mins with emphasis on L LE + UE activation. Standing in // bars with 4# ankle weights: HSC, lunges on bosu ball 2x15 B. Patient ambulated back to her room with no AD and distant supervision. PT assisting patient with lunch set up. Left seated edge of bed with bed alarm on, call light within reach, RN aware of patients location.   Debbora Dus 10/25/2020, 11:23 AM

## 2020-10-25 NOTE — Progress Notes (Signed)
Inpatient Rehabilitation Care Coordinator Discharge Note   Patient Details  Name: Kristen Grant MRN: [id number] Date of Birth: [birth year]   Discharge location: Home  Length of Stay: 9 Days  Discharge activity level: Sup/Cga  Home/community participation: Mother able to assist 24/7  Patient response WU:JWJXBJ Literacy - How often do you need to have someone help you when you read instructions, pamphlets, or other written material from your doctor or pharmacy?: Sometimes  Patient response YN:WGNFAO Isolation - How often do you feel lonely or isolated from those around you?: Never  Services provided included: SW, Pharmacy, CM, TR, RN, SLP, PT, OT, RD, MD  Financial Services:  Financial Services Utilized: Other (Comment) ([employer])    Choices offered to/list presented to: pt  Follow-up services arranged:  Outpatient    Outpatient Servicies: [facility]      Patient response to transportation need: Is the patient able to respond to transportation needs?: No In the past 12 months, has lack of transportation kept you from medical appointments or from getting medications?: No In the past 12 months, has lack of transportation kept you from meetings, work, or from getting things needed for daily living?: No    Comments (or additional information):  Patient/Family verbalized understanding of follow-up arrangements:  Yes  Individual responsible for coordination of the follow-up plan: patient  Confirmed correct DME delivered: Andria Rhein 10/25/2020    Andria Rhein

## 2020-10-26 ENCOUNTER — Other Ambulatory Visit (HOSPITAL_COMMUNITY): Payer: Self-pay

## 2020-10-26 NOTE — Progress Notes (Signed)
PROGRESS NOTE   Subjective/Complaints: No new issues. Slept well excited to be going home today  ROS: Patient denies fever, rash, sore throat, blurred vision, nausea, vomiting, diarrhea, cough, shortness of breath or chest pain, joint or back pain, headache, or mood change.   Objective:   No results found. No results for input(s): WBC, HGB, HCT, PLT in the last 72 hours.  No results for input(s): NA, K, CL, CO2, GLUCOSE, BUN, CREATININE, CALCIUM in the last 72 hours.   Intake/Output Summary (Last 24 hours) at 10/26/2020 0840 Last data filed at 10/25/2020 2010 Gross per 24 hour  Intake 380 ml  Output --  Net 380 ml        Physical Exam: Vital Signs Blood pressure 109/71, pulse 77, temperature 98.5 F (36.9 C), temperature source Oral, resp. rate 17, height 5\' 9"  (1.753 m), weight 88.1 kg, SpO2 99 %. Constitutional: No distress . Vital signs reviewed. HEENT: NCAT, EOMI, oral membranes moist Neck: supple Cardiovascular: RRR without murmur. No JVD    Respiratory/Chest: CTA Bilaterally without wheezes or rales. Normal effort    GI/Abdomen: BS +, non-tender, non-distended Ext: no clubbing, cyanosis, or edema Psych: flat, cooperative Musc: No edema in extremities.  No tenderness in extremities. Neuro: Alert Motor: Left lower extremity: 4+/5 proximal distal Left upper extremity: 3-/5 delt bi tri finger flex/ext  Assessment/Plan: 1. Functional deficits which require 3+ hours per day of interdisciplinary therapy in a comprehensive inpatient rehab setting. Physiatrist is providing close team supervision and 24 hour management of active medical problems listed below. Physiatrist and rehab team continue to assess barriers to discharge/monitor patient progress toward functional and medical goals  Care Tool:  Bathing    Body parts bathed by patient: Left arm, Chest, Abdomen, Front perineal area, Buttocks, Right upper leg,  Left upper leg, Right lower leg, Left lower leg, Face, Right arm   Body parts bathed by helper: Right arm     Bathing assist Assist Level: Supervision/Verbal cueing     Upper Body Dressing/Undressing Upper body dressing   What is the patient wearing?: Pull over shirt    Upper body assist Assist Level: Set up assist    Lower Body Dressing/Undressing Lower body dressing      What is the patient wearing?: Pants, Underwear/pull up     Lower body assist Assist for lower body dressing: Supervision/Verbal cueing     Toileting Toileting    Toileting assist Assist for toileting: Supervision/Verbal cueing     Transfers Chair/bed transfer  Transfers assist     Chair/bed transfer assist level: Independent     Locomotion Ambulation   Ambulation assist      Assist level: Supervision/Verbal cueing Assistive device: No Device Max distance: 150'   Walk 10 feet activity   Assist     Assist level: Independent Assistive device: No Device   Walk 50 feet activity   Assist Walk 50 feet with 2 turns activity did not occur: Safety/medical concerns  Assist level: Supervision/Verbal cueing Assistive device: No Device    Walk 150 feet activity   Assist Walk 150 feet activity did not occur: Safety/medical concerns  Assist level: Supervision/Verbal cueing Assistive device: No Device  Walk 10 feet on uneven surface  activity   Assist Walk 10 feet on uneven surfaces activity did not occur: Safety/medical concerns   Assist level: Supervision/Verbal cueing     Wheelchair     Assist Is the patient using a wheelchair?: No             Wheelchair 50 feet with 2 turns activity    Assist            Wheelchair 150 feet activity     Assist          Blood pressure 109/71, pulse 77, temperature 98.5 F (36.9 C), temperature source Oral, resp. rate 17, height 5\' 9"  (1.753 m), weight 88.1 kg, SpO2 99 %.  Medical Problem List and  Plan: 1.  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 CVA of cryptogenic origin. TEE really unremarkable. ? Small PFO  Continue CIR, patient family education Plan for DC home today PRAFO/WHO nightly  Baclofen 5 nightly started on 9/17- has tight finger flexors  2.  Antithrombotics: -DVT/anticoagulation:  Mechanical: Sequential compression devices, below knee Bilateral lower extremities             -antiplatelet therapy: Aspirin 81 mg daily 3. Pain Management: Tylenol as needed 4. Mood/anxiety : Team to Provide emotional support             -antipsychotic agents: N/A             - low dose xanax for anxiety 5. Neuropsych: This patient is capable of making decisions on her own behalf. 6. Skin/Wound Care: Routine skin checks 7. Fluids/Electrolytes/Nutrition: Routine in and outs  8.  Hyperlipidemia: Lipitor 9.  Urine drug screen positive marijuana.  Counseling 10.  Obesity.  BMI 29.92.  Dietary follow-up  11. Constipation order prn laxative  Improving 12.  PFO looks smaller on TEE than on TTE, unlikely to require PFO closure, will f/u with neuro as OP after inpt rehab  14.  Leukocytosis- resolved   WBCs normal at 8.6K 15.  Anemia Hemoglobin 11.6 on 9/19  LOS: 9 days A FACE TO FACE EVALUATION WAS PERFORMED  10/19 10/26/2020, 8:40 AM

## 2020-10-26 NOTE — Progress Notes (Signed)
Patient discharged at 0940 to home with family. All discharge instructions and medications sent home with patient. Instructions given yesterday with Harvel Ricks PA. All questions answered.

## 2020-10-26 NOTE — Progress Notes (Signed)
Pt slept well throughout the night. Denies pain or discomfort. Pt compliant with resting hand splint, refused PRAFO boot.

## 2020-10-28 ENCOUNTER — Other Ambulatory Visit: Payer: Self-pay

## 2020-10-28 ENCOUNTER — Ambulatory Visit: Payer: Managed Care, Other (non HMO) | Attending: Physician Assistant

## 2020-10-28 ENCOUNTER — Ambulatory Visit: Payer: Managed Care, Other (non HMO)

## 2020-10-28 VITALS — BP 114/57 | HR 72 | Resp 16 | Ht 69.0 in | Wt 197.0 lb

## 2020-10-28 DIAGNOSIS — I63511 Cerebral infarction due to unspecified occlusion or stenosis of right middle cerebral artery: Secondary | ICD-10-CM

## 2020-10-28 DIAGNOSIS — R269 Unspecified abnormalities of gait and mobility: Secondary | ICD-10-CM | POA: Diagnosis not present

## 2020-10-28 DIAGNOSIS — R278 Other lack of coordination: Secondary | ICD-10-CM | POA: Insufficient documentation

## 2020-10-28 DIAGNOSIS — R262 Difficulty in walking, not elsewhere classified: Secondary | ICD-10-CM | POA: Insufficient documentation

## 2020-10-28 DIAGNOSIS — R2681 Unsteadiness on feet: Secondary | ICD-10-CM | POA: Diagnosis present

## 2020-10-28 DIAGNOSIS — M6281 Muscle weakness (generalized): Secondary | ICD-10-CM | POA: Diagnosis present

## 2020-10-28 NOTE — Therapy (Signed)
[place] [facility] MAIN [facility] SERVICES [address] [place], [place], [address] Phone: [phone]   Fax:  [phone]  Physical Therapy Evaluation  Patient Details  Name: Kristen Grant MRN: [id number] Date of Birth: 26-Dec-1995 Referring Provider (PT): Mariam Dollar   Encounter Date: 10/28/2020   PT End of Session - 10/28/20 1414     Visit Number 1    Number of Visits 25    Date for PT Re-Evaluation 01/20/21    PT Start Time 1345    PT Stop Time 1446    PT Time Calculation (min) 61 min    Equipment Utilized During Treatment Gait belt    Activity Tolerance Patient tolerated treatment well    Behavior During Therapy WFL for tasks assessed/performed             Past Medical History:  Diagnosis Date   Anxiety    Asthma    no treatments required at this time   Migraine headache     Past Surgical History:  Procedure Laterality Date   BUBBLE STUDY  10/17/2020   Procedure: BUBBLE STUDY;  Surgeon: Jake Bathe, MD;  Location: [facility] ENDOSCOPY;  Service: Cardiovascular;;   IR ANGIO INTRA EXTRACRAN SEL COM CAROTID INNOMINATE UNI L MOD SED  10/11/2020   IR ANGIO VERTEBRAL SEL VERTEBRAL UNI L MOD SED  10/11/2020   IR CT HEAD LTD  10/11/2020   IR CT HEAD LTD  10/12/2020   IR PERCUTANEOUS ART THROMBECTOMY/INFUSION INTRACRANIAL INC DIAG ANGIO  10/11/2020   IR PERCUTANEOUS ART THROMBECTOMY/INFUSION INTRACRANIAL INC DIAG ANGIO  10/12/2020   NO PAST SURGERIES     RADIOLOGY WITH ANESTHESIA N/A 10/12/2020   Procedure: RADIOLOGY WITH ANESTHESIA;  Surgeon: Radiologist, Medication, MD;  Location: [facility] OR;  Service: Radiology;  Laterality: N/A;   RADIOLOGY WITH ANESTHESIA N/A 10/11/2020   Procedure: IR WITH ANESTHESIA;  Surgeon: Radiologist, Medication, MD;  Location: [facility] OR;  Service: Radiology;  Laterality: N/A;   TEE WITHOUT CARDIOVERSION N/A 10/17/2020   Procedure: TRANSESOPHAGEAL ECHOCARDIOGRAM (TEE);  Surgeon: Jake Bathe, MD;  Location: [facility] ENDOSCOPY;  Service:  Cardiovascular;  Laterality: N/A;    Vitals:   10/28/20 1409  BP: (!) 114/57  Pulse: 72  Resp: 16  SpO2: 97%  Weight: 197 lb (89.4 kg)  Height: 5\' 9"  (1.753 m)      Subjective Assessment - 10/28/20 1501     Subjective Patient reports having 2 CVAs  on 10/11/2020- Reports that she is mostly having difficulty with [name] weakness and overall fatigue. She reports she was tired after going to grocery store today.    Patient is accompained by: Family member    Pertinent History Kristen Grant is a 25 year old right-handed female with morbid obesity BMI 29.92, anxiety, migraine headaches, tobacco use.  Per chart review patient independent prior to admission working full-time- Working at 22 in [employer]. Presented 10/11/2020 with acute onset of left-sided weakness, dysarthria and facial droop.  Cranial CT scan showed hypodensity in the right anterior temporal lobe concerning for infarct.  Pertaining to patient's right MCA infarct predominantly right caudate putamen anterior limb right internal capsule secondary to right M1 occlusion status post revascularization mechanical thrombectomy requiring repeat thrombectomy 10/12/2020 for occluded dominant superior division of right MCA.    Limitations Walking;House hold activities    How long can you sit comfortably? no limitations    How long can you stand comfortably? 30 min    How long can you  walk comfortably? 20 min    Patient Stated Goals To improve my balance and endurance back to my previous level of working full time and taking care of 5 year son    Currently in Pain? No/denies                [facility] PT Assessment - 10/28/20 1514       Assessment   Medical Diagnosis Right cerebral MCA infarct    Referring Provider (PT) Mariam Dollar    Onset Date/Surgical Date 10/11/20    Hand Dominance Right    Prior Therapy Inpatient Rehab PT/OT      Restrictions   Weight Bearing Restrictions No      Balance Screen   Has the  patient fallen in the past 6 months No    Has the patient had a decrease in activity level because of a fear of falling?  Yes    Is the patient reluctant to leave their home because of a fear of falling?  No      Home Environment   Living Environment Private residence    Living Arrangements Children   76 year old son   Available Help at Discharge Family;Friend(s)    Type of Home House    Home Access Stairs to enter    Entrance Stairs-Number of Steps 2    Entrance Stairs-Rails None    Home Layout One level    Home Equipment Walker - 4 wheels;Wheelchair - manual      Prior Function   Level of Independence Independent    Vocation Full time employment      Cognition   Overall Cognitive Status Within Functional Limits for tasks assessed    Attention Focused    Focused Attention Appears intact    Memory Appears intact    Awareness Appears intact    Problem Solving Appears intact    Executive Function Reasoning;Sequencing;Organizing;Decision Making;Initiating;Self Monitoring;Self Correcting    Reasoning Appears intact    Sequencing Appears intact    Organizing Appears intact    Decision Making Appears intact    Initiating Appears intact    Self Monitoring Appears intact    Self Correcting Appears intact      Observation/Other Assessments   Skin Integrity No skin integrity issues    Focus on Therapeutic Outcomes (FOTO)  71      Sensation   Light Touch Appears Intact                OBJECTIVE  MUSCULOSKELETAL: Tremor: Absent Bulk: Normal Tone: Normal, no clonus  Posture No gross abnormalities noted in standing or seated posture  Gait Unsteady with dynamic head turns Gait speed= 1.0 m/s using no device  Strength R/L 4/4 Hip flexion 5/5 Hip external rotation 5/5 Hip internal rotation 5/5 Hip extension  5/5 Hip abduction 5/5 Hip adduction 4/4 Knee extension 5/5 Knee flexion 5/5 Ankle Plantarflexion 5/5 Ankle Dorsiflexion   NEUROLOGICAL:  Mental  Status Patient is oriented to person, place and time.  Recent memory is intact.  Remote memory is intact.  Attention span and concentration are intact.  Expressive speech is intact.  Patient's fund of knowledge is within normal limits for educational level.  Sensation Grossly intact to light touch bilateral UEs/LEs as determined by testing dermatomes C2-T2/L2-S2 respectively Proprioception and hot/cold testing deferred on this date   Coordination/Cerebellar Finger to Nose: WNL on right; Impaired on left Heel to Shin: WNL Rapid alternating movements: WNL on right; Impaired on left Finger Opposition:  WNL or right; impaired on left    FUNCTIONAL OUTCOME MEASURES   Results Comments          FGA 23/30 Fall risk, in need of intervention  TUG 11.37 seconds   5TSTS  14.61 seconds   6 Minute Walk Test To be tested next visit   10 Meter Gait Speed Self-selected: 9.61s =  1.04 m/s                ASSESSMENT Clinical Impression: Pt is a pleasant  25 year-old female referred for difficulty with balance due to recent diagnosis of Right cerebral MCA infarct with Left sided weakness. PT examination reveals deficits in LE strength, gait and Higher level balance difficulty. Pt will benefit from skilled PT services to address deficits in balance and decrease risk for future falls.       .        Objective measurements completed on examination: See above findings.                PT Education - 10/28/20 2250     Education Details PT plan of care    Person(s) Educated Patient    Methods Explanation;Verbal cues    Comprehension Verbalized understanding              PT Short Term Goals - 10/28/20 2321       PT SHORT TERM GOAL #1   Title Pt will be independent with HEP in order to improve strength and balance in order to decrease fall risk and improve function at home and work.    Baseline 10/28/2020- Patient has no formal HEP in place    Time 6    Period  Weeks    Status New    Target Date 12/09/20               PT Long Term Goals - 10/28/20 2328       PT LONG TERM GOAL #1   Title Pt will improve FOTO to target score of 75 to  display perceived improvements in ability to complete ADL's.    Baseline 10/28/2020= 71    Time 12    Period Weeks    Status New    Target Date 01/20/21      PT LONG TERM GOAL #2   Title Pt will decrease 5TSTS by at least 3 seconds in order to demonstrate clinically significant improvement in LE strength    Baseline 10/28/2020= 14.61 sec without UE support    Time 12    Period Weeks    Status New    Target Date 01/20/21      PT LONG TERM GOAL #3   Title Patient will increase Functional Gait Assessment score to >26/30 as to reduce fall risk and improve dynamic gait safety with community ambulation.    Baseline 10/28/2020= 23/30    Time 12    Period Weeks    Status New    Target Date 01/20/21                    Plan - 10/28/20 2252     Clinical Impression Statement Pt is a pleasant  25 year-old female referred for difficulty with balance due to recent diagnosis of Right cerebral MCA infarct with Left sided weakness. PT examination reveals deficits in LE strength, gait and Higher level balance difficulty. Pt will benefit from skilled PT services to address deficits in balance and decrease risk for future falls.  Personal Factors and Comorbidities Comorbidity 1    Comorbidities CVA    Examination-Activity Limitations Caring for Others;Stairs    Examination-Participation Restrictions Community Activity;Occupation;Yard Work    Stability/Clinical Decision Making Stable/Uncomplicated    Rehab Potential Good    PT Frequency 2x / week    PT Duration 12 weeks    PT Treatment/Interventions ADLs/Self Care Home Management;Cryotherapy;Moist Heat;DME Instruction;Gait training;Stair training;Functional mobility training;Therapeutic activities;Therapeutic exercise;Balance training;Neuromuscular  re-education;Patient/family education;Manual techniques;Passive range of motion;Dry needling    PT Next Visit Plan Instruct in static and dynamic standing Balance exercises    PT Home Exercise Plan To be initiated next visit.    Consulted and Agree with Plan of Care Patient             Patient will benefit from skilled therapeutic intervention in order to improve the following deficits and impairments:  Abnormal gait, Decreased activity tolerance, Decreased balance, Decreased coordination, Decreased endurance, Cardiopulmonary status limiting activity, Decreased mobility, Decreased strength, Difficulty walking, Impaired UE functional use  Visit Diagnosis: Abnormality of gait and mobility  Difficulty in walking, not elsewhere classified  Muscle weakness (generalized)  Unsteadiness on feet     Problem List Patient Active Problem List   Diagnosis Date Noted   Anemia    Hemiparesis affecting left side as late effect of stroke (HCC)    Leukocytosis    Dyslipidemia    Spastic hemiparesis (HCC)    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   Long term current use of hormonal contraceptive 10/12/2020   Recurrent strokes (HCC) 10/11/2020   Middle cerebral artery embolism, 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 [place], [place], [address] Phone: [phone]   Fax:  [phone]  Name: Kristen Grant MRN: [id number] Date of Birth: 11/27/1995

## 2020-10-29 ENCOUNTER — Other Ambulatory Visit: Payer: Self-pay

## 2020-10-29 NOTE — Therapy (Signed)
[facility] MAIN [facility] SERVICES [address] [place], [place], [address] Phone: [phone]   Fax:  [phone]  Occupational Therapy Evaluation  Patient Details  Name: MARGARETMARY PRISK MRN: [id number] Date of Birth: 02/11/1995 Referring Provider (OT): Mariam Dollar, [place]  Encounter Date: 10/28/2020   OT End of Session - 10/29/20 1245     Visit Number 1    Number of Visits 24    Date for OT Re-Evaluation 01/19/21    OT Start Time 1445    OT Stop Time 1540    OT Time Calculation (min) 55 min    Equipment Utilized During Treatment wc    Activity Tolerance Patient tolerated treatment well    Behavior During Therapy WFL for tasks assessed/performed             Past Medical History:  Diagnosis Date   Anxiety    Asthma    no treatments required at this time   Migraine headache     Past Surgical History:  Procedure Laterality Date   BUBBLE STUDY  10/17/2020   Procedure: BUBBLE STUDY;  Surgeon: Jake Bathe, MD;  [facility] ENDOSCOPY;  Service: Cardiovascular;;   IR ANGIO INTRA EXTRACRAN SEL COM CAROTID INNOMINATE UNI L MOD SED  10/11/2020   IR ANGIO VERTEBRAL SEL VERTEBRAL UNI L MOD SED  10/11/2020   IR CT HEAD LTD  10/11/2020   IR CT HEAD LTD  10/12/2020   IR PERCUTANEOUS ART THROMBECTOMY/INFUSION INTRACRANIAL INC DIAG ANGIO  10/11/2020   IR PERCUTANEOUS ART THROMBECTOMY/INFUSION INTRACRANIAL INC DIAG ANGIO  10/12/2020   NO PAST SURGERIES     RADIOLOGY WITH ANESTHESIA N/A 10/12/2020   Procedure: RADIOLOGY WITH ANESTHESIA;  Surgeon: Radiologist, Medication, MD;  [facility]: [facility] OR;  Service: Radiology;  Laterality: N/A;   RADIOLOGY WITH ANESTHESIA N/A 10/11/2020   Procedure: IR WITH ANESTHESIA;  Surgeon: Radiologist, Medication, MD;  [facility]: [facility] OR;  Service: Radiology;  Laterality: N/A;   TEE WITHOUT CARDIOVERSION N/A 10/17/2020   Procedure: TRANSESOPHAGEAL ECHOCARDIOGRAM (TEE);  Surgeon: Jake Bathe, MD;  Location: [facility] ENDOSCOPY;  Service:  Cardiovascular;  Laterality: N/A;    There were no vitals filed for this visit.   Subjective Assessment - 10/29/20 1241     Subjective  "I want to get back to work as soon as I can."    Patient is accompanied by: Family member    Pertinent History Recent R MCA infarct on 10/11/20, underwent  mechanical thrombectomy on 10/11/20, a second time on 10/12/20.    Limitations L sided hemiparesis, decreased balance, coordination, decreased indep with ADLs/IADLs/work/leisure    Patient Stated Goals Return to work and hold her 10 y/o son.    Currently in Pain? No/denies    Pain Score 0-No pain               [facility] OT Assessment - 10/29/20 0001       Assessment   Medical Diagnosis Right cerebral MCA infarct    Referring Provider (OT) Mariam Dollar, PA    Onset Date/Surgical Date 10/11/20    Hand Dominance Right    Next MD Visit October 12th to cardiologist    Prior Therapy [facility] 1 week PT/OT      Restrictions   Weight Bearing Restrictions No      Balance Screen   Has the patient fallen in the past 6 months No      Home  Environment   Family/patient expects to be  discharged to: Private residence    Living Arrangements Other(Comment)   mother moved in with pt and pt's 47 y/o son after pt's stroke   Available Help at Discharge Family    Type of Home House    Home Access Stairs    Home Layout One level    Alternate Level Stairs - Number of Steps 2    Bathroom Shower/Tub Tub/Shower unit    Chartered certified accountant - 4 wheels;Tub bench;Grab bars - tub/shower;Hand held shower head      Prior Function   Level of Independence Independent    Vocation Full time employment   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   Vocation Requirements Pt must be able to lift a 40 lb box overhead    Leisure shop, do puzzles, color      ADL   Eating/Feeding Set up   assist  to cut food and open containers   Grooming Minimal assistance   can brush hair with R hand but unable to braid hair (2 handed task limited d/t LUE weakness)   Upper Body Bathing Modified independent    Lower Body Bathing Modified independent    Upper Body Dressing Set up   assist with clothing fasteners   Lower Body Dressing Set up   assist with tying laces   Toilet Transfer Independent    Tub/Shower Transfer Modified independent   using transfer tub bench     IADL   Light Housekeeping Performs light daily tasks but cannot maintain acceptable level of cleanliness   limited by LUE weakness and incoordination     Mobility   Mobility Status Comments walks without device in the home; uses wc for community distances      Written Expression   Dominant Hand Right      Vision - History   Baseline Vision No visual deficits    Additional Comments Pt reports no visual changes from baseline      Vision Assessment   Ocular Range of Motion Within Functional Limits    Tracking/Visual Pursuits Able to track stimulus in all quads without difficulty    Saccades Additional eye shifts occurred during testing    Visual Fields No apparent deficits    Depth Perception no apparent deficits      Observation/Other Assessments   Skin Integrity No issues    Focus on Therapeutic Outcomes (FOTO)  incomplete d/t time constraints; will complete next session      Sensation   Light Touch Appears Intact    Hot/Cold Appears Intact    Proprioception Appears Intact      Coordination   Gross Motor Movements are Fluid and Coordinated No    Fine Motor Movements are Fluid and Coordinated No    Right 9 Hole Peg Test 18 sec    Left 9 Hole Peg Test 1 min 7 sec      AROM   Overall AROM Comments RUE WNL, L shoulder flex 0-115      Strength   Overall Strength Comments RUE 5/5; L shoulder flex 3-, abd 3+, ER 3+, IR 4-, elbow flex/ext 4, wrist flex/ext 4-      Hand Function   Right Hand Grip (lbs) 56    Right Hand  Lateral Pinch 16 lbs    Right Hand 3 Point Pinch 12 lbs    Left Hand  Grip (lbs) 20    Left Hand Lateral Pinch 10 lbs    Left 3 point pinch 6 lbs            Occupational Therapy Evaluation: Pt is a 25 y/o female presenting to outpatient Occupational Therapy following recent R MCA infarct.  Pt presents with LUE hemiparesis and coordination deficits which are currently impacting ADLs/IADLs/work/leisure and ability to independently care for her 19 y/o son.  Pt's mother has moved in with pt and son to assist with care needs for pt and her son. 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  Pt's goal is to return to work, though she anticipates a job change with plans to manage at [employer].  Pt's goal is to improve strength/coordination throughout LUE to enable return to work, hold her son, and manage light house work.    Neuro re-ed: Issued pink theraputty and instructed pt in gross grasping, pinching, digit abd/add, and digging coins from putty.  Pt able to return demo with issued written handout.  Encouraged completion 1-2 x daily 5-10 min at a time.   Pt verbalized understanding.     OT Education - 10/29/20 1244     Education Details Pink theraputty exercises for L hand strengthening and coordination    Person(s) Educated Patient;Parent(s)    Methods Explanation;Demonstration;Verbal cues;Handout    Comprehension Verbalized understanding;Returned demonstration;Verbal cues required;Need further instruction              OT Short Term Goals - 10/29/20 1247       OT SHORT TERM GOAL #1   Title Pt will perform HEP with min vc for improving LUE strength and coordination    Baseline Eval: initiated theraputty HEP at eval; further training needed    Time 6    Period Weeks    Status New    Target Date 12/08/20               OT Long Term Goals - 10/29/20 1248       OT LONG TERM GOAL #1   Title Pt will improve FOTO score to indicate improved functional  performance.    Baseline Eval: incomplete FOTO, will complete next session    Time 12    Period Weeks    Status New    Target Date 01/19/21      OT LONG TERM GOAL #2   Title Pt will improve LUE strength by to enable pt to hold 5 y/o son and lift/carry heavy supplies for work and ADLs.    Baseline Eval: Pt can not lift son (35-40 lbs).  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    Time 12    Period Weeks    Status New    Target Date 01/19/21      OT LONG TERM GOAL #3   Title Pt will improve LUE GMC/FMC skills to enable her to braid her own hair.    Baseline Eval: Dependent to braid hair    Time 12    Period Weeks    Status New    Target Date 01/19/21      OT LONG TERM GOAL #4   Title Pt will improve L grip and pinch strength to enable her to open bottles and containers with modified indep.    Baseline Eval: Requires assist to open bottles and containers (L grip 20 lbs, R 56; L lateral pinch 10 lbs,  R 16, L 3 point pinch 6 lbs, R 12.    Time 12    Period Weeks    Status New    Target Date 01/19/21      OT LONG TERM GOAL #5   Title Pt will improve BUE coordination to enable indep sweeping, making beds, and other light home making tasks.    Baseline Eval: assist needed with IADL tasks which require use of 2 hands.    Time 12    Period Weeks    Status New    Target Date 01/19/21              Plan - 10/29/20 1709     Clinical Impression Statement Pt is a 25 y/o female presenting to outpatient Occupational Therapy following recent R MCA infarct.  Pt presents with LUE hemiparesis and coordination deficits which are currently impacting ADLs/IADLs/work/leisure and ability to independently care for her 11 y/o son.  Pt's mother has moved in with pt and son to assist with care needs for pt and her son. 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  Pt's goal is to return to work, though she anticipates a job change with plans  to manage at [employer].  Pt's goal is to improve strength/coordination throughout LUE to enable return to work, hold her son, and manage light house work.    OT Occupational Profile and History Detailed Assessment- Review of Records and additional review of physical, cognitive, psychosocial history related to current functional performance    Occupational performance deficits (Please refer to evaluation for details): ADL's;IADL's;Leisure;Work    Games developer / Function / Physical Skills ADL;Coordination;Endurance;UE functional use;Balance;Body mechanics;IADL;Dexterity;FMC;Strength;Mobility;ROM    Rehab Potential Excellent    Clinical Decision Making Several treatment options, min-mod task modification necessary    Comorbidities Affecting Occupational Performance: May have comorbidities impacting occupational performance    Modification or Assistance to Complete Evaluation  Min-Moderate modification of tasks or assist with assess necessary to complete eval    OT Frequency 2x / week    OT Duration 12 weeks    OT Treatment/Interventions Self-care/ADL training;Therapeutic exercise;DME and/or AE instruction;Neuromuscular education;Manual Therapy;Passive range of motion;Therapeutic activities;Patient/family education    Plan OT to address LUE hemiparesis and lack of coordination    OT Home Exercise Plan Theraputty HEP issued at eval    Consulted and Agree with Plan of Care Patient;Family member/caregiver    Family Member Consulted Mother                  Patient will benefit from skilled therapeutic intervention in order to improve the following deficits and impairments:   Body Structure / Function / Physical Skills: ADL, Coordination, Endurance, UE functional use, Balance, Body mechanics, IADL, Dexterity, FMC, Strength, Mobility, ROM       Visit Diagnosis: Muscle weakness (generalized)  Right middle cerebral artery stroke (HCC)  Other lack of coordination    Problem List Patient  Active Problem List   Diagnosis Date Noted   Anemia    Hemiparesis affecting left side as late effect of stroke (HCC)    Leukocytosis    Dyslipidemia    Spastic hemiparesis (HCC)    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   Long term current use of hormonal contraceptive 10/12/2020   Recurrent strokes (HCC) 10/11/2020   Middle cerebral artery embolism, 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 [place], [place], [address] Phone: [phone]   Fax:  [phone]  Name: ALAURA SCHIPPERS MRN: [id number] Date of Birth: 12/21/1995

## 2020-10-31 ENCOUNTER — Ambulatory Visit: Payer: Managed Care, Other (non HMO)

## 2020-10-31 ENCOUNTER — Other Ambulatory Visit: Payer: Self-pay

## 2020-10-31 ENCOUNTER — Ambulatory Visit: Payer: Managed Care, Other (non HMO) | Admitting: Occupational Therapy

## 2020-10-31 DIAGNOSIS — M6281 Muscle weakness (generalized): Secondary | ICD-10-CM

## 2020-10-31 DIAGNOSIS — R2681 Unsteadiness on feet: Secondary | ICD-10-CM

## 2020-10-31 DIAGNOSIS — R269 Unspecified abnormalities of gait and mobility: Secondary | ICD-10-CM | POA: Diagnosis not present

## 2020-10-31 DIAGNOSIS — R262 Difficulty in walking, not elsewhere classified: Secondary | ICD-10-CM

## 2020-10-31 DIAGNOSIS — R278 Other lack of coordination: Secondary | ICD-10-CM

## 2020-10-31 NOTE — Therapy (Signed)
[place] [facility] MAIN [facility] SERVICES [address] [place], [place], [address] Phone: [phone]   Fax:  [phone]  Occupational Therapy Treatment  Patient Details  Name: Kristen Grant MRN: [id number] Date of Birth: 09/11/1995 Referring Provider (OT): Mariam Dollar, [place]   Encounter Date: 10/31/2020   OT End of Session - 10/31/20 1733     Visit Number 2    Number of Visits 24    Date for OT Re-Evaluation 01/19/21    Authorization Type Progress report period starting 10/28/2020    OT Start Time 1350    OT Stop Time 1430    OT Time Calculation (min) 40 min    Equipment Utilized During Treatment wc    Activity Tolerance Patient tolerated treatment well    Behavior During Therapy WFL for tasks assessed/performed             Past Medical History:  Diagnosis Date   Anxiety    Asthma    no treatments required at this time   Migraine headache     Past Surgical History:  Procedure Laterality Date   BUBBLE STUDY  10/17/2020   Procedure: BUBBLE STUDY;  Surgeon: Jake Bathe, MD;  [facility] ENDOSCOPY;  Service: Cardiovascular;;   IR ANGIO INTRA EXTRACRAN SEL COM CAROTID INNOMINATE UNI L MOD SED  10/11/2020   IR ANGIO VERTEBRAL SEL VERTEBRAL UNI L MOD SED  10/11/2020   IR CT HEAD LTD  10/11/2020   IR CT HEAD LTD  10/12/2020   IR PERCUTANEOUS ART THROMBECTOMY/INFUSION INTRACRANIAL INC DIAG ANGIO  10/11/2020   IR PERCUTANEOUS ART THROMBECTOMY/INFUSION INTRACRANIAL INC DIAG ANGIO  10/12/2020   NO PAST SURGERIES     RADIOLOGY WITH ANESTHESIA N/A 10/12/2020   Procedure: RADIOLOGY WITH ANESTHESIA;  Surgeon: Radiologist, Medication, MD;  [facility]: [facility] OR;  Service: Radiology;  Laterality: N/A;   RADIOLOGY WITH ANESTHESIA N/A 10/11/2020   Procedure: IR WITH ANESTHESIA;  Surgeon: Radiologist, Medication, MD;  [facility]: [facility] OR;  Service: Radiology;  Laterality: N/A;   TEE WITHOUT CARDIOVERSION N/A 10/17/2020   Procedure: TRANSESOPHAGEAL ECHOCARDIOGRAM (TEE);   Surgeon: Jake Bathe, MD;  Location: [facility] ENDOSCOPY;  Service: Cardiovascular;  Laterality: N/A;    There were no vitals filed for this visit.   Subjective Assessment - 10/31/20 1730     Subjective  Pt.  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    Limitations L sided hemiparesis, decreased balance, coordination, decreased indep with ADLs/IADLs/work/leisure    Currently in Pain? No/denies            OT TREATMENT    Neuro muscular re-education:  Pt. worked on grasping, flipping, turning, and stacking [place] style discs. Pt. required visual demonstration, and cues for movement patterns. Pt. worked on speed, and Engineer, agricultural.  Pt. Worked on grasping  and storing the discs in her hand. Pt. worked on moving the discs from the palm to the tip of her digits, and thumb.   Therapeutic Exercise:  Pt. worked on [employer], and reciprocal motion using the UBE while seated for 3 min. with no resistance. Constant monitoring was provided . Pt. required support at the left elbow, and wrist. Pt.'s left hand occasionally came off  of the handle during rotation.  Pt. performed gross gripping with a gross grip strengthener. Pt. worked on sustaining grip while grasping pegs and placing them into a container at the tabletop. The gripper was set to 11.2# of grip strength resistance. Pt. performed gross gripping with a gross grip strengthener. Pt. worked on pinch strengthening in the left hand for lateral, and 3pt. pinch using yellow, red, green, and blue resistive clips. Pt. worked on placing the clips at various vertical and horizontal angles. Tactile and verbal cues were required for eliciting the desired movement.   Pt. is making progress with LUE  functioning. Pt. required support proximally for the left elbow, and wrist on the UBE. Pt. required verbal, and tactile cues for the gripper, and resistive clips. Pt. was able to initiate translatory movements with the [place] style discs. Pt. continues to work on improving LUE strength, motor control, and [facility] skills in order to work towards increasing engagement of the LUE during daily tasks in order to improve, and maximize independence with ADLs, and IADL tasks.                          OT Education - 10/31/20 1733     Education Details Left hand strength, coordination    Person(s) Educated Patient;Parent(s)    Methods Explanation;Demonstration;Verbal cues;Handout    Comprehension Verbalized understanding;Returned demonstration;Verbal cues required;Need further instruction              OT Short Term Goals - 10/29/20 1247       OT SHORT TERM GOAL #1   Title Pt will perform HEP with min vc for improving LUE strength and coordination    Baseline Eval: initiated theraputty HEP at eval; further training needed    Time 6    Period Weeks    Status New    Target Date 12/08/20               OT Long Term Goals - 10/29/20 1248       OT LONG TERM GOAL #1   Title Pt will improve FOTO score to indicate improved functional performance.    Baseline Eval: incomplete FOTO, will complete next session    Time 12    Period Weeks    Status New    Target Date 01/19/21      OT LONG TERM GOAL #2   Title Pt will improve LUE strength by to enable pt to hold 5 y/o son and lift/carry heavy supplies for work and ADLs.    Baseline Eval: Pt can not lift son (35-40 lbs).  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    Time 12    Period Weeks    Status New    Target Date 01/19/21      OT LONG TERM GOAL #3   Title Pt will improve LUE GMC/FMC skills to enable her to braid her own hair.    Baseline Eval: Dependent to braid hair    Time 12     Period Weeks    Status New    Target Date 01/19/21      OT LONG TERM GOAL #4   Title Pt will improve L grip and pinch strength to enable her to open bottles and containers with modified indep.    Baseline Eval: Requires assist to open bottles and containers (L grip 20 lbs, R 56; L lateral pinch 10 lbs, R 16,  L 3 point pinch 6 lbs, R 12.    Time 12    Period Weeks    Status New    Target Date 01/19/21      OT LONG TERM GOAL #5   Title Pt will improve BUE coordination to enable indep sweeping, making beds, and other light home making tasks.    Baseline Eval: assist needed with IADL tasks which require use of 2 hands.    Time 12    Period Weeks    Status New    Target Date 01/19/21                   Plan - 10/31/20 1736     Clinical Impression Statement Pt. is making progress with LUE functioning. Pt. required support proximally for the left elbow, and wrist on the UBE. Pt. required verbal, and tactile cues for the gripper, and resistive clips. Pt. was able to initiate translatory movements with the [place] style discs. Pt. continues to work on improving LUE strength, motor control, and [facility] skills in order to work towards increasing engagement of the LUE during daily tasks in order to improve, and maximize independence with ADLs, and IADL tasks.    OT Occupational Profile and History Detailed Assessment- Review of Records and additional review of physical, cognitive, psychosocial history related to current functional performance    Occupational performance deficits (Please refer to evaluation for details): ADL's;IADL's;Leisure;Work    Games developer / Function / Physical Skills ADL;Coordination;Endurance;UE functional use;Balance;Body mechanics;IADL;Dexterity;FMC;Strength;Mobility;ROM    Rehab Potential Excellent    Clinical Decision Making Several treatment options, min-mod task modification necessary    Comorbidities Affecting Occupational Performance: May have comorbidities  impacting occupational performance    Modification or Assistance to Complete Evaluation  Min-Moderate modification of tasks or assist with assess necessary to complete eval    OT Frequency 2x / week    OT Duration 12 weeks    OT Treatment/Interventions Self-care/ADL training;Therapeutic exercise;DME and/or AE instruction;Neuromuscular education;Manual Therapy;Passive range of motion;Therapeutic activities;Patient/family education    Consulted and Agree with Plan of Care Patient;Family member/caregiver             Patient will benefit from skilled therapeutic intervention in order to improve the following deficits and impairments:   Body Structure / Function / Physical Skills: ADL, Coordination, Endurance, UE functional use, Balance, Body mechanics, IADL, Dexterity, FMC, Strength, Mobility, ROM       Visit Diagnosis: Muscle weakness (generalized)  Other lack of coordination    Problem List Patient Active Problem List   Diagnosis Date Noted   Anemia    Hemiparesis affecting left side as late effect of stroke (HCC)    Leukocytosis    Dyslipidemia    Spastic hemiparesis (HCC)    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   Long term current use of hormonal contraceptive 10/12/2020   Recurrent strokes (HCC) 10/11/2020   Middle cerebral artery embolism, 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 [place], [place], [address] Phone:  [phone]   Fax:   [phone]  Name: Kristen Grant MRN: [id number] Date of Birth: 1996/01/18

## 2020-10-31 NOTE — Therapy (Signed)
[facility] MAIN [facility] SERVICES [address] [place], [place], [address] Phone: [phone]   Fax:  [phone]  Physical Therapy Treatment  Patient Details  Name: Kristen Grant MRN: [id number] Date of Birth: 1995-02-08 Referring Provider (PT): Mariam Dollar   Encounter Date: 10/31/2020   PT End of Session - 10/31/20 1630     Visit Number 2    Number of Visits 25    Date for PT Re-Evaluation 01/20/21    PT Start Time 1300    PT Stop Time 1344    PT Time Calculation (min) 44 min    Equipment Utilized During Treatment Gait belt    Activity Tolerance Patient tolerated treatment well    Behavior During Therapy WFL for tasks assessed/performed             Past Medical History:  Diagnosis Date   Anxiety    Asthma    no treatments required at this time   Migraine headache     Past Surgical History:  Procedure Laterality Date   BUBBLE STUDY  10/17/2020   Procedure: BUBBLE STUDY;  Surgeon: Jake Bathe, MD;  Location: [facility] ENDOSCOPY;  Service: Cardiovascular;;   IR ANGIO INTRA EXTRACRAN SEL COM CAROTID INNOMINATE UNI L MOD SED  10/11/2020   IR ANGIO VERTEBRAL SEL VERTEBRAL UNI L MOD SED  10/11/2020   IR CT HEAD LTD  10/11/2020   IR CT HEAD LTD  10/12/2020   IR PERCUTANEOUS ART THROMBECTOMY/INFUSION INTRACRANIAL INC DIAG ANGIO  10/11/2020   IR PERCUTANEOUS ART THROMBECTOMY/INFUSION INTRACRANIAL INC DIAG ANGIO  10/12/2020   NO PAST SURGERIES     RADIOLOGY WITH ANESTHESIA N/A 10/12/2020   Procedure: RADIOLOGY WITH ANESTHESIA;  Surgeon: Radiologist, Medication, MD;  Location: [facility] OR;  Service: Radiology;  Laterality: N/A;   RADIOLOGY WITH ANESTHESIA N/A 10/11/2020   Procedure: IR WITH ANESTHESIA;  Surgeon: Radiologist, Medication, MD;  Location: [facility] OR;  Service: Radiology;  Laterality: N/A;   TEE WITHOUT CARDIOVERSION N/A 10/17/2020   Procedure: TRANSESOPHAGEAL ECHOCARDIOGRAM (TEE);  Surgeon: Jake Bathe, MD;  Location: [facility] ENDOSCOPY;  Service:  Cardiovascular;  Laterality: N/A;    There were no vitals filed for this visit.   Subjective Assessment - 10/31/20 1306     Subjective Patient reports having a headache most of the day today. Reports feeling good at the moment.    Patient is accompained by: Family member    Pertinent History Kristen Grant is a 25 year old right-handed female with morbid obesity BMI 29.92, anxiety, migraine headaches, tobacco use.  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  Cranial CT scan showed hypodensity in the right anterior temporal lobe concerning for infarct.  Pertaining to patient's right MCA infarct predominantly right caudate putamen anterior limb right internal capsule secondary to right M1 occlusion status post revascularization mechanical thrombectomy requiring repeat thrombectomy 10/12/2020 for occluded dominant superior division of right MCA.    Limitations Walking;House hold activities    How long can you sit comfortably? no limitations    How long can you stand comfortably? 30 min    How long can you walk comfortably? 20 min    Patient Stated Goals To improve my balance and endurance back to my previous level of working full time and taking care of 5 year son    Currently in Pain? No/denies  Interventions:   BP= 100/68 mmHg right arm sitting at rest BP= 114/60 mmHg right arm standing (asymptomatic and no complaint of headache)   Neuro - re-ed:  All activities performed in // bars today.   Staggered stance (back foot on blue airex pad and front foot on step box) Hold x 20 sec - Mild difficulty with left LE as back leg.  Added EC x 20 sec and only min increased sway- using mostly ankle righting reaction.  Added EO and head turning x 5 (left to right)   Dynamic high marching on blue airex pad without UE  support. (VC for increased height initially and patient able to respond and no loss of balance) x 25 rep each.   1/2 tandem on airex pad- Eyes open then closed x 20 sec without difficulty.   Tandem on airex pad- EO, EC, EO with Head turns/nods  and EC with head turns/nods x 20 sec x 2 sets each- Only difficulty with eyes closed an head turning. More difficulty with left LE placed posteriorly.   Tandem standing on 1/2 foam roll x 20 sec x 2 trials.   Tandem gait- stepping forward then backward with back leg on blue 1/2 foam roll. (More difficulty with left LE - increased unsteadiness and occasional Reaching for UE support)   Side stepping on 1/2 foam roll (left to right then back x 6 each way)   Access Code: [email or web address] URL: [email or web address] Date: 10/31/2020 Prepared by: Maureen Ralphs, PT  Exercises Half Tandem Stance Balance with Eyes Closed - 1 x daily - 3 x weekly - 4 sets - 20 hold Single Leg Stance - 1 x daily - 3 x weekly - 4 sets - up to 20 hold    Clinical impression: Patient performed well with intro to static and dynamic standing balance exercises. She was able to follow all VC and visual demo for correct and safe techniques to ensure safety with challenging balance activities. She denied any pain during visit and able to improve with practice today. Her left LE was weaker and more unstable with SLS and tandem activities. She will benefit from continued skilled PT services to improve her overall functional LE strength, static/dynamic standing balance for decreased risk of falling and improved quality of life.                          PT Education - 10/31/20 1630     Education Details specific balance education- sensory component    Person(s) Educated Patient    Methods Explanation;Demonstration;Tactile cues;Verbal cues;Handout    Comprehension Verbalized understanding;Returned demonstration;Verbal cues required;Tactile cues  required;Need further instruction              PT Short Term Goals - 10/28/20 2321       PT SHORT TERM GOAL #1   Title Pt will be independent with HEP in order to improve strength and balance in order to decrease fall risk and improve function at home and work.    Baseline 10/28/2020- Patient has no formal HEP in place    Time 6    Period Weeks    Status New    Target Date 12/09/20               PT Long Term Goals - 10/28/20 2328       PT LONG TERM GOAL #1   Title Pt will improve FOTO to target score of 75 to  display perceived improvements  in ability to complete ADL's.    Baseline 10/28/2020= 71    Time 12    Period Weeks    Status New    Target Date 01/20/21      PT LONG TERM GOAL #2   Title Pt will decrease 5TSTS by at least 3 seconds in order to demonstrate clinically significant improvement in LE strength    Baseline 10/28/2020= 14.61 sec without UE support    Time 12    Period Weeks    Status New    Target Date 01/20/21      PT LONG TERM GOAL #3   Title Patient will increase Functional Gait Assessment score to >26/30 as to reduce fall risk and improve dynamic gait safety with community ambulation.    Baseline 10/28/2020= 23/30    Time 12    Period Weeks    Status New    Target Date 01/20/21                   Plan - 10/31/20 1633     Clinical Impression Statement Patient performed well with intro to static and dynamic standing balance exercises. She was able to follow all VC and visual demo for correct and safe techniques to ensure safety with challenging balance activities. She denied any pain during visit and able to improve with practice today. Her left LE was weaker and more unstable with SLS and tandem activities. She will benefit from continued skilled PT services to improve her overall functional LE strength, static/dynamic standing balance for decreased risk of falling and improved quality of life.    Personal Factors and Comorbidities  Comorbidity 1    Comorbidities CVA    Examination-Activity Limitations Caring for Others;Stairs    Examination-Participation Restrictions Community Activity;Occupation;Yard Work    Stability/Clinical Decision Making Stable/Uncomplicated    Rehab Potential Good    PT Frequency 2x / week    PT Duration 12 weeks    PT Treatment/Interventions ADLs/Self Care Home Management;Cryotherapy;Moist Heat;DME Instruction;Gait training;Stair training;Functional mobility training;Therapeutic activities;Therapeutic exercise;Balance training;Neuromuscular re-education;Patient/family education;Manual techniques;Passive range of motion;Dry needling    PT Next Visit Plan Continue with progressive higher level static and dynamic standing Balance exercises    PT Home Exercise Plan 10/31/2020: Access Code: [email or web address]  (balance exercises);    Consulted and Agree with Plan of Care Patient             Patient will benefit from skilled therapeutic intervention in order to improve the following deficits and impairments:  Abnormal gait, Decreased activity tolerance, Decreased balance, Decreased coordination, Decreased endurance, Cardiopulmonary status limiting activity, Decreased mobility, Decreased strength, Difficulty walking, Impaired UE functional use  Visit Diagnosis: Abnormality of gait and mobility  Difficulty in walking, not elsewhere classified  Muscle weakness (generalized)  Unsteadiness on feet     Problem List Patient Active Problem List   Diagnosis Date Noted   Anemia    Hemiparesis affecting left side as late effect of stroke (HCC)    Leukocytosis    Dyslipidemia    Spastic hemiparesis (HCC)    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   Long term current use of hormonal contraceptive 10/12/2020   Recurrent strokes (HCC) 10/11/2020   Middle cerebral artery embolism, 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   Nicotine vapor product user 10/06/2019   Migraines   05/09/2019   Smoker 1-2 Black & Milds/day 12/26/2018   LGSIL on Pap smear of cervix 08/18/2018   Encounter for surveillance of injectable  contraceptive 08/18/2018   Generalized anxiety disorder with panic attacks 07/26/2017   ADD (attention deficit disorder) 07/07/2017   Obesity (BMI 30-39.9) 07/22/2015    Lenda Kelp, PT 10/31/2020, 4:49 PM  [facility] MAIN [facility] SERVICES [address] [place], [place], [address] Phone: [phone]   Fax:  [phone]  Name: Kristen Grant MRN: [id number] Date of Birth: 1995/05/12

## 2020-11-01 ENCOUNTER — Encounter: Payer: Self-pay | Admitting: Physical Medicine & Rehabilitation

## 2020-11-01 ENCOUNTER — Encounter
Payer: Managed Care, Other (non HMO) | Attending: Physical Medicine & Rehabilitation | Admitting: Physical Medicine & Rehabilitation

## 2020-11-01 ENCOUNTER — Other Ambulatory Visit: Payer: Self-pay

## 2020-11-01 VITALS — BP 105/72 | HR 68 | Temp 98.1°F | Ht 69.0 in | Wt 200.4 lb

## 2020-11-01 DIAGNOSIS — I63511 Cerebral infarction due to unspecified occlusion or stenosis of right middle cerebral artery: Secondary | ICD-10-CM | POA: Diagnosis not present

## 2020-11-01 DIAGNOSIS — I69354 Hemiplegia and hemiparesis following cerebral infarction affecting left non-dominant side: Secondary | ICD-10-CM | POA: Insufficient documentation

## 2020-11-01 DIAGNOSIS — G43009 Migraine without aura, not intractable, without status migrainosus: Secondary | ICD-10-CM | POA: Diagnosis not present

## 2020-11-01 MED ORDER — TOPIRAMATE 25 MG PO TABS: 25.0000 mg | ORAL_TABLET | Freq: Two times a day (BID) | ORAL | 1 refills | Status: DC

## 2020-11-01 NOTE — Progress Notes (Signed)
Subjective:    Patient ID: Kristen Grant, female    DOB: 11-05-1995, 25 y.o.   MRN: [id number]  25 y.o. right-handed female with history of morbid obesity BMI 29.92 migraine headaches tobacco use.  Per chart review independent prior to admission working full-time.  She lives with her boyfriend and 109-year-old son.  She has a mother with good support.  Presented 10/11/2020 with acute onset of left-sided weakness dysarthria and facial droop.  Cranial CT scan showed hypodensity in the right anterior temporal lobe concerning for infarction.  CT angiogram head and neck thrombus in the distal right M1 segment with near complete occlusion.  11 cc infarct core in the right corona radiata.  Admission chemistries unremarkable urine drug screen positive for marijuana as well as benzos.  Patient underwent mechanical thrombectomy 10/11/2020 with revascularization per interventional radiology requiring repeat thrombectomy 24 hours later for occluded dominant superior division of the right MCA achieving revascularization.  Latest MRI and imaging 10/13/2020 showed acute anterior right MCA territory infarct predominantly involving the right caudate putamen and anterior limb of the right internal capsule, insula, operculum and overlying right frontal lobe.  A few punctate acute infarcts in the right parietal lobe.  Associated cytotoxic edema with slight 2-3 mm leftward midline shift at the foramen of Monro.  Probable small amount of hemorrhage in the posterior right frontal lobe/insular region.  Echocardiogram with ejection fraction of 60 to 65% no wall motion abnormalities.  Hyper coag work-up negative.  TEE was pending.  Patient was cleared to begin aspirin for CVA prophylaxis.  Lower extremity Dopplers negative.  Tolerating a regular diet.  Therapy evaluations completed due to patient's left-sided weakness and dysarthria was admitted for a comprehensive rehab program.    Admit date: 10/17/2020 Discharge date:  10/26/2020  HPI 25 year old female recently discharged for [facility] inpatient rehabilitation center she is now independent with all self-care and mobility.  She is receiving outpatient PT and OT services.  She has not had any falls since being at home.  She states she does not have a primary care physician but would like to find one through her insurance provider panel. She is taking her medications including baby aspirin once a day, Lipitor 80 mg a day.  She has no history of hypertension.  Stroke work-up was unremarkable.  The initial TTE demonstrated a PFO however the TEE showed only a minimal size PFO and therefore this was not thought to be the cause of stroke. She has been having headaches on a regular basis mainly during the day.  She has tried Tylenol as well as ibuprofen without relief. She is overall goals of returning to driving and returning to work.  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 Pain Inventory Average Pain 6 Pain Right Now 4 My pain is sharp and aching  LOCATION OF PAIN  head  BOWEL Number of stools per week: 2-3 Oral laxative use No  Type of laxative na Enema or suppository use No  History of colostomy No  Incontinent No   BLADDER Normal In and out cath, frequency na Able to self cath  na Bladder incontinence No  Frequent urination No  Leakage with coughing No  Difficulty starting stream No  Incomplete bladder emptying No    Mobility walk with assistance ability to climb steps?  yes do you drive?  no  Function  disabled: date disabled 10/09/20 I need assistance with the following:  meal prep, household duties, and shopping Neuro/Psych dizziness  depression anxiety  Prior Studies TC appt  Physicians involved in your care TC appt   Family History  Problem Relation Age of Onset   Hypertension Mother    Epilepsy Mother    Anxiety disorder Mother    Depression Mother    Migraines Mother    Diabetes Maternal Grandmother    Stroke  Maternal Grandmother    Hypertension Maternal Grandmother    Breast cancer Neg Hx    Social History   Socioeconomic History   Marital status: Single    Spouse name: Not on file   Number of children: 1   Years of education: Not on file   Highest education level: GED or equivalent  Occupational History   Not on file  Tobacco Use   Smoking status: Every Day    Types: E-cigarettes    Start date: 2021   Smokeless tobacco: Never  Vaping Use   Vaping Use: Every day  Substance and Sexual Activity   Alcohol use: Not Currently    Comment: 1-2 drinks per month   Drug use: Yes    Frequency: 3.0 times per week    Types: Marijuana   Sexual activity: Yes    Partners: Male    Birth control/protection: Injection  Other Topics Concern   Not on file  Social History Narrative   Not on file   Social Determinants of Health   Financial Resource Strain: Not on file  Food Insecurity: Not on file  Transportation Needs: Not on file  Physical Activity: Not on file  Stress: Not on file  Social Connections: Not on file   Past Surgical History:  Procedure Laterality Date   BUBBLE STUDY  10/17/2020   Procedure: BUBBLE STUDY;  Surgeon: Jake Bathe, MD;  [facility]: [facility] ENDOSCOPY;  Service: Cardiovascular;;   IR ANGIO INTRA EXTRACRAN SEL COM CAROTID INNOMINATE UNI L MOD SED  10/11/2020   IR ANGIO VERTEBRAL SEL VERTEBRAL UNI L MOD SED  10/11/2020   IR CT HEAD LTD  10/11/2020   IR CT HEAD LTD  10/12/2020   IR PERCUTANEOUS ART THROMBECTOMY/INFUSION INTRACRANIAL INC DIAG ANGIO  10/11/2020   IR PERCUTANEOUS ART THROMBECTOMY/INFUSION INTRACRANIAL INC DIAG ANGIO  10/12/2020   NO PAST SURGERIES     RADIOLOGY WITH ANESTHESIA N/A 10/12/2020   Procedure: RADIOLOGY WITH ANESTHESIA;  Surgeon: Radiologist, Medication, MD;  Location: [facility] OR;  Service: Radiology;  Laterality: N/A;   RADIOLOGY WITH ANESTHESIA N/A 10/11/2020   Procedure: IR WITH ANESTHESIA;  Surgeon: Radiologist, Medication, MD;  Location: [facility] OR;  Service:  Radiology;  Laterality: N/A;   TEE WITHOUT CARDIOVERSION N/A 10/17/2020   Procedure: TRANSESOPHAGEAL ECHOCARDIOGRAM (TEE);  Surgeon: Jake Bathe, MD;  Location: [facility] ENDOSCOPY;  Service: Cardiovascular;  Laterality: N/A;   Past Medical History:  Diagnosis Date   Anxiety    Asthma    no treatments required at this time   Migraine headache    BP 105/72   Pulse 68   Temp 98.1 F (36.7 C) (Oral)   Ht 5\' 9"  (1.753 m)   Wt 200 lb 6.4 oz (90.9 kg)   LMP  (LMP Unknown) Comment: depo shots  SpO2 98%   BMI 29.59 kg/m   Opioid Risk Score:   Fall Risk Score:  `1  Depression screen PHQ 2/9  Depression screen [facility] 2/9 11/01/2020 10/06/2019  Decreased Interest 1 1  Down, Depressed, Hopeless 1 2  PHQ - 2 Score 2 3  Altered sleeping 3 3  Tired, decreased energy  3 3  Change in appetite 1 3  Feeling bad or failure about yourself  1 1  Trouble concentrating 1 2  Moving slowly or fidgety/restless 1 1  Suicidal thoughts 0 0  PHQ-9 Score 12 16  Difficult doing work/chores Somewhat difficult Very difficult     Review of Systems     Objective:   Physical Exam Vitals and nursing note reviewed.  Constitutional:      Appearance: She is obese.  HENT:     Head: Normocephalic and atraumatic.  Eyes:     General: No visual field deficit.    Extraocular Movements: Extraocular movements intact.     Conjunctiva/sclera: Conjunctivae normal.     Pupils: Pupils are equal, round, and reactive to light.  Cardiovascular:     Rate and Rhythm: Normal rate and regular rhythm.     Heart sounds: Normal heart sounds.  Pulmonary:     Effort: Pulmonary effort is normal. No respiratory distress.     Breath sounds: Normal breath sounds.  Abdominal:     General: Abdomen is flat. Bowel sounds are normal. There is no distension.     Palpations: Abdomen is soft.  Musculoskeletal:     Cervical back: Normal range of motion.     Comments: No pain with upper extremity or lower extremity range of motion  bilaterally.  Skin:    General: Skin is warm and dry.  Neurological:     Mental Status: She is alert and oriented to person, place, and time.     Cranial Nerves: No dysarthria or facial asymmetry.     Sensory: Sensation is intact.     Motor: Weakness present. No abnormal muscle tone.     Coordination: Finger-Nose-Finger Test and Heel to Gray Test normal. Impaired rapid alternating movements.     Gait: Gait and tandem walk normal.     Comments: Motor strength is 5/5 in the right deltoid, bicep, tricep, grip, hip flexor, knee extensor, ankle dorsiflexion plantarflexion 4/5 in left deltoid, bicep, tricep, grip, 4+ left hip flexor knee extensor ankle dorsiflexor and plantar flexor          Assessment & Plan:  1.  History of right MCA distribution infarct no significant left neglect and cognitive deficits.  Main deficit is left upper extremity weakness and reduced motor control. Overall anticipate excellent functional outcome.  Continue outpatient PT OT follow-up in 6 weeks at which point she should be ready to drive.  She should be ready for return to work in about 3 months 2.  Post stroke headache initiate Topamax 25 mg twice daily may need to titrate upward to a maximum of 100 twice daily 3.  Secondary stroke prevention, follow-up with neurology needs to establish with primary care.  The meantime continue aspirin 81 mg a day as well as Lipitor 80 mg daily.

## 2020-11-05 ENCOUNTER — Ambulatory Visit: Payer: Managed Care, Other (non HMO)

## 2020-11-05 ENCOUNTER — Ambulatory Visit: Payer: Managed Care, Other (non HMO) | Attending: Physician Assistant

## 2020-11-05 ENCOUNTER — Other Ambulatory Visit: Payer: Self-pay

## 2020-11-05 DIAGNOSIS — I63511 Cerebral infarction due to unspecified occlusion or stenosis of right middle cerebral artery: Secondary | ICD-10-CM

## 2020-11-05 DIAGNOSIS — R262 Difficulty in walking, not elsewhere classified: Secondary | ICD-10-CM | POA: Insufficient documentation

## 2020-11-05 DIAGNOSIS — R278 Other lack of coordination: Secondary | ICD-10-CM

## 2020-11-05 DIAGNOSIS — R2681 Unsteadiness on feet: Secondary | ICD-10-CM | POA: Diagnosis not present

## 2020-11-05 DIAGNOSIS — R2689 Other abnormalities of gait and mobility: Secondary | ICD-10-CM | POA: Diagnosis present

## 2020-11-05 DIAGNOSIS — R269 Unspecified abnormalities of gait and mobility: Secondary | ICD-10-CM | POA: Diagnosis present

## 2020-11-05 DIAGNOSIS — M6281 Muscle weakness (generalized): Secondary | ICD-10-CM

## 2020-11-05 NOTE — Therapy (Signed)
[facility] MAIN [facility] SERVICES [address] [place], [place], [address] Phone: [phone]   Fax:  [phone]  Occupational Therapy Treatment  Patient Details  Name: Kristen Grant MRN: [id number] Date of Birth: 1995-05-16 Referring Provider (OT): Mariam Dollar, [place]   Encounter Date: 11/05/2020   OT End of Session - 11/05/20 1339     Visit Number 3    Number of Visits 24    Date for OT Re-Evaluation 01/19/21    Authorization Type Progress report period starting 10/28/2020    OT Start Time 1103    OT Stop Time 1146    OT Time Calculation (min) 43 min    Equipment Utilized During Treatment wc    Activity Tolerance Patient tolerated treatment well    Behavior During Therapy WFL for tasks assessed/performed             Past Medical History:  Diagnosis Date   Anxiety    Asthma    no treatments required at this time   Migraine headache     Past Surgical History:  Procedure Laterality Date   BUBBLE STUDY  10/17/2020   Procedure: BUBBLE STUDY;  Surgeon: Jake Bathe, MD;  [facility]: [facility] ENDOSCOPY;  Service: Cardiovascular;;   IR ANGIO INTRA EXTRACRAN SEL COM CAROTID INNOMINATE UNI L MOD SED  10/11/2020   IR ANGIO VERTEBRAL SEL VERTEBRAL UNI L MOD SED  10/11/2020   IR CT HEAD LTD  10/11/2020   IR CT HEAD LTD  10/12/2020   IR PERCUTANEOUS ART THROMBECTOMY/INFUSION INTRACRANIAL INC DIAG ANGIO  10/11/2020   IR PERCUTANEOUS ART THROMBECTOMY/INFUSION INTRACRANIAL INC DIAG ANGIO  10/12/2020   NO PAST SURGERIES     RADIOLOGY WITH ANESTHESIA N/A 10/12/2020   Procedure: RADIOLOGY WITH ANESTHESIA;  Surgeon: Radiologist, Medication, MD;  Location: [facility] OR;  Service: Radiology;  Laterality: N/A;   RADIOLOGY WITH ANESTHESIA N/A 10/11/2020   Procedure: IR WITH ANESTHESIA;  Surgeon: Radiologist, Medication, MD;  Location: [facility] OR;  Service: Radiology;  Laterality: N/A;   TEE WITHOUT CARDIOVERSION N/A 10/17/2020   Procedure: TRANSESOPHAGEAL ECHOCARDIOGRAM (TEE);   Surgeon: Jake Bathe, MD;  Location: [facility] ENDOSCOPY;  Service: Cardiovascular;  Laterality: N/A;    There were no vitals filed for this visit.   Subjective Assessment - 11/05/20 1336     Subjective  "I think I slept on my hand wrong the other night when I didn't wear my brace because my hand hurts now."    Patient is accompanied by: Family member    Pertinent History Recent R MCA infarct on 10/11/20, underwent  mechanical thrombectomy on 10/11/20, a second time on 10/12/20.    Limitations L sided hemiparesis, decreased balance, coordination, decreased indep with ADLs/IADLs/work/leisure    Patient Stated Goals Return to work and hold her 25 y/o son.    Currently in Pain? Yes    Pain Score 5     Pain Location Hand    Pain Orientation Left    Pain Descriptors / Indicators Aching    Pain Type Acute pain    Pain Onset In the past 7 days    Pain Frequency Intermittent    Aggravating Factors  gripping    Pain Relieving Factors rest    Effect of Pain on Daily Activities limited tolerance for gripping with L hand    Multiple Pain Sites No           Occupational Therapy Treatment: Neuro re-ed: Participation in reaching and pinching with LUE,  clipping therapy resistant clothespins onto vertical and horizontal bars.  Pt was able to clip yellow and red pins with L hand, but required occasional assist from R hand to clip green pins.  Required intermittent cues to keep trunk stable to facilitate reaching from shoulder rather than by leaning toward target.    Self Care: ADL strategies recommended for bathing son and taking dog out, both of which mother still manages for pt.  Made recommendation to place chair on front porch and sit to hook dog up to long leash anchored on porch to allow dog out in the yard without tugging on pt.  Also encouraged pt to participate in bathing son with mother present in case of any need for extra physical assist.  Encouraged pt sit on her shower chair as needed when  assisting son with shower/tub.  Pt receptive to all recommendations.   Therapeutic Exercise: Participated in standing UBE x5 min total with minimal resistance, 2.5 min forward, 2.5 min reverse rotation.  L hand lost grip of hand pedal x2 but pt able to independently re-grip.  Vc to engage L thumb more to maintain grip of pedal with pt able to return demo.  Used 1 lb wrist weight to perform L elbow flex/ext, pron/sup, chest press, shoulder elevation/abd, horiz add, ER x2 sets 10 each.  OT provided assist to perform shoulder elevation and abd to achieve max end range.   Response to Treatment: Pt reported L hand pain on ulnar side and dorsal aspect of L hand.  Pt reported that she woke up with pain and attributed it to laying wrong without her brace on that night.  Very mild swelling noted and pt reporting pain with gripping or pressure to this area.  OT avoided gripping today and utilized wrist weight instead of dumbbell for LUE strengthening with good tolerance.  Encouraged pt use ice to L hand x20 min as needed to minimize pain and swelling in L hand.  Pt also plans to wear her wrist brace to bed consistently.  OT to continue to address LUE hemiparesis in order to maximize indep with ADLs/IADLs.        OT Education - 11/05/20 1338     Education Details ADL/IADL progression with supv from mother    Person(s) Educated Patient;Parent(s)    Methods Explanation;Demonstration;Verbal cues;Handout    Comprehension Verbalized understanding;Returned demonstration;Verbal cues required;Need further instruction              OT Short Term Goals - 10/29/20 1247       OT SHORT TERM GOAL #1   Title Pt will perform HEP with min vc for improving LUE strength and coordination    Baseline Eval: initiated theraputty HEP at eval; further training needed    Time 6    Period Weeks    Status New    Target Date 12/08/20               OT Long Term Goals - 10/29/20 1248       OT LONG TERM GOAL #1    Title Pt will improve FOTO score to indicate improved functional performance.    Baseline Eval: incomplete FOTO, will complete next session    Time 12    Period Weeks    Status New    Target Date 01/19/21      OT LONG TERM GOAL #2   Title Pt will improve LUE strength by to enable pt to hold 5 y/o son and lift/carry heavy  supplies for work and ADLs.    Baseline Eval: Pt can not lift son (35-40 lbs).  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    Time 12    Period Weeks    Status New    Target Date 01/19/21      OT LONG TERM GOAL #3   Title Pt will improve LUE GMC/FMC skills to enable her to braid her own hair.    Baseline Eval: Dependent to braid hair    Time 12    Period Weeks    Status New    Target Date 01/19/21      OT LONG TERM GOAL #4   Title Pt will improve L grip and pinch strength to enable her to open bottles and containers with modified indep.    Baseline Eval: Requires assist to open bottles and containers (L grip 20 lbs, R 56; L lateral pinch 10 lbs, R 16, L 3 point pinch 6 lbs, R 12.    Time 12    Period Weeks    Status New    Target Date 01/19/21      OT LONG TERM GOAL #5   Title Pt will improve BUE coordination to enable indep sweeping, making beds, and other light home making tasks.    Baseline Eval: assist needed with IADL tasks which require use of 2 hands.    Time 12    Period Weeks    Status New    Target Date 01/19/21                   Plan - 11/05/20 1706     Clinical Impression Statement Pt reported L hand pain on ulnar side and dorsal aspect of L hand.  Pt reported that she woke up with pain and attributed it to laying wrong without her brace on that night.  Very mild swelling noted and pt reporting pain with gripping or pressure to this area.  OT avoided gripping today and utilized wrist weight instead of dumbbell for LUE strengthening with good tolerance.  Encouraged pt use ice to L hand x20 min as needed to  minimize pain and swelling in L hand.  Pt also plans to wear her wrist brace to bed consistently.  OT to continue to address LUE hemiparesis in order to maximize indep with ADLs/IADLs.    OT Occupational Profile and History Detailed Assessment- Review of Records and additional review of physical, cognitive, psychosocial history related to current functional performance    Occupational performance deficits (Please refer to evaluation for details): ADL's;IADL's;Leisure;Work    Games developer / Function / Physical Skills ADL;Coordination;Endurance;UE functional use;Balance;Body mechanics;IADL;Dexterity;FMC;Strength;Mobility;ROM    Rehab Potential Excellent    Clinical Decision Making Several treatment options, min-mod task modification necessary    Comorbidities Affecting Occupational Performance: May have comorbidities impacting occupational performance    Modification or Assistance to Complete Evaluation  Min-Moderate modification of tasks or assist with assess necessary to complete eval    OT Frequency 2x / week    OT Duration 12 weeks    OT Treatment/Interventions Self-care/ADL training;Therapeutic exercise;DME and/or AE instruction;Neuromuscular education;Manual Therapy;Passive range of motion;Therapeutic activities;Patient/family education    Consulted and Agree with Plan of Care Patient;Family member/caregiver             Patient will benefit from skilled therapeutic intervention in order to improve the following deficits and impairments:   Body Structure / Function / Physical Skills: ADL, Coordination, Endurance,  UE functional use, Balance, Body mechanics, IADL, Dexterity, FMC, Strength, Mobility, ROM       Visit Diagnosis: Muscle weakness (generalized)  Other lack of coordination  Right middle cerebral artery stroke [facility])    Problem List Patient Active Problem List   Diagnosis Date Noted   Anemia    Hemiparesis affecting left side as late effect of stroke (HCC)     Leukocytosis    Dyslipidemia    Spastic hemiparesis (HCC)    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   Long term current use of hormonal contraceptive 10/12/2020   Recurrent strokes (HCC) 10/11/2020   Middle cerebral artery embolism, 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 [place], [place], [address] Phone: [phone]   Fax:  [phone]  Name: LONEY PETO MRN: [id number] Date of Birth: Apr 23, 1995

## 2020-11-05 NOTE — Therapy (Signed)
[facility] MAIN [facility] SERVICES [address] [place], [place], [address] Phone: [phone]   Fax:  [phone]  Physical Therapy Treatment  Patient Details  Name: Kristen Grant MRN: [id number] Date of Birth: 25-Sep-1995 Referring Provider (PT): Mariam Dollar   Encounter Date: 11/05/2020   PT End of Session - 11/05/20 1420     Visit Number 3    Number of Visits 25    Date for PT Re-Evaluation 01/20/21    PT Start Time 1147    PT Stop Time 1228    PT Time Calculation (min) 41 min    Equipment Utilized During Treatment Gait belt    Activity Tolerance Patient tolerated treatment well    Behavior During Therapy WFL for tasks assessed/performed             Past Medical History:  Diagnosis Date   Anxiety    Asthma    no treatments required at this time   Migraine headache     Past Surgical History:  Procedure Laterality Date   BUBBLE STUDY  10/17/2020   Procedure: BUBBLE STUDY;  Surgeon: Jake Bathe, MD;  [facility] ENDOSCOPY;  Service: Cardiovascular;;   IR ANGIO INTRA EXTRACRAN SEL COM CAROTID INNOMINATE UNI L MOD SED  10/11/2020   IR ANGIO VERTEBRAL SEL VERTEBRAL UNI L MOD SED  10/11/2020   IR CT HEAD LTD  10/11/2020   IR CT HEAD LTD  10/12/2020   IR PERCUTANEOUS ART THROMBECTOMY/INFUSION INTRACRANIAL INC DIAG ANGIO  10/11/2020   IR PERCUTANEOUS ART THROMBECTOMY/INFUSION INTRACRANIAL INC DIAG ANGIO  10/12/2020   NO PAST SURGERIES     RADIOLOGY WITH ANESTHESIA N/A 10/12/2020   Procedure: RADIOLOGY WITH ANESTHESIA;  Surgeon: Radiologist, Medication, MD;  Location: [facility] OR;  Service: Radiology;  Laterality: N/A;   RADIOLOGY WITH ANESTHESIA N/A 10/11/2020   Procedure: IR WITH ANESTHESIA;  Surgeon: Radiologist, Medication, MD;  [facility]: [facility] OR;  Service: Radiology;  Laterality: N/A;   TEE WITHOUT CARDIOVERSION N/A 10/17/2020   Procedure: TRANSESOPHAGEAL ECHOCARDIOGRAM (TEE);  Surgeon: Jake Bathe, MD;  Location: [facility] ENDOSCOPY;  Service:  Cardiovascular;  Laterality: N/A;    There were no vitals filed for this visit.   Subjective Assessment - 11/05/20 1146     Subjective Pt reports she currently has some L hand pain. Pt reports she slept on L hand "funny," and rates pain at rest as a 3/10. Pt says pain increases to a 6/10 when clenching L fist.    Patient is accompained by: Family member    Pertinent History Kristen Grant is a 25 year old right-handed female with morbid obesity BMI 29.92, anxiety, migraine headaches, tobacco use.  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  Cranial CT scan showed hypodensity in the right anterior temporal lobe concerning for infarct.  Pertaining to patient's right MCA infarct predominantly right caudate putamen anterior limb right internal capsule secondary to right M1 occlusion status post revascularization mechanical thrombectomy requiring repeat thrombectomy 10/12/2020 for occluded dominant superior division of right MCA.    Limitations Walking;House hold activities    How long can you sit comfortably? no limitations    How long can you stand comfortably? 30 min    How long can you walk comfortably? 20 min    Patient Stated Goals To improve my balance and endurance back to my previous level of  working full time and taking care of 5 year son    Currently in Pain? Yes    Pain Score 3     Pain Location Hand    Pain Orientation Left           INTERVENTIONS  TherEx-   Nustep, seat 8, level 0-2, pt cued for SPM >60s. Maintains SPM >60s-70s (increased to level 2 at 2.5 min) - x6 min total. Cuing for cool down. Pt reports level 0 was easy, reports level 2 was moderate. Close CGA for mount/dismount. Pt monitored for response throughout.   Continued LE mm and cardiorespiratory endurance training with treadmill training -- pt  ambulates up to 1.8 mph, HR reaches max of 92 bpm, x 5 min; rates medium, and does report LE fatigue. CGA for mount/dismount and while pt on treadmill, pt monitored for response throughout.   Neuro - re-ed:  All activities performed in // bars today. PT provides CGA throughout.   Staggered stance (back foot on blue airex pad and front foot on step box) Hold 60 sec each LE  --progressed to performing with vertical and horizontal head turns. Pt had to focus on a point with horizontal head turns d/t reported increased irration with eyes. Overall, pt utilizes intermittent UE support d/t decreased postural stability  NBOS on ariex with EC 60 sec: pt exhibits increased sway  --progressed to more challenging variation with multiple reps of vertical and horizontal head-turns. Must use intermittent UE support.   Dynamic high marching on blue airex pad without UE support. 2x10. Second set cued to decrease speed to further challenge SLB. Pt without LOB. Rates exercise as easy.  Semi-tandem stance on airex pad - 30 sec each LE. Pt rates easy. No LOB.  Tandem stance on airex pad - 2x30 sec each LE. Pt rates as difficult.   Tandem walking on airex beam - x multiple reps. Pt able to progress to performing with dual task (telling PT about her pets). Pt rates as medium. Intermittent UE support.   Pt educated throughout session about proper posture and technique with exercises. Improved exercise technique, movement at target joints, use of target muscles after min to mod verbal, visual, tactile cues.    PT Education - 11/05/20 1420     Education Details exercise technique, body mechanics    Person(s) Educated Patient    Methods Explanation;Demonstration;Verbal cues;Tactile cues    Comprehension Verbalized understanding;Returned demonstration;Need further instruction              PT Short Term Goals - 10/28/20 2321       PT SHORT TERM GOAL #1   Title Pt will be independent with HEP in order to  improve strength and balance in order to decrease fall risk and improve function at home and work.    Baseline 10/28/2020- Patient has no formal HEP in place    Time 6    Period Weeks    Status New    Target Date 12/09/20               PT Long Term Goals - 10/28/20 2328       PT LONG TERM GOAL #1   Title Pt will improve FOTO to target score of 75 to  display perceived improvements in ability to complete ADL's.    Baseline 10/28/2020= 71    Time 12    Period Weeks    Status New    Target Date 01/20/21      PT  LONG TERM GOAL #2   Title Pt will decrease 5TSTS by at least 3 seconds in order to demonstrate clinically significant improvement in LE strength    Baseline 10/28/2020= 14.61 sec without UE support    Time 12    Period Weeks    Status New    Target Date 01/20/21      PT LONG TERM GOAL #3   Title Patient will increase Functional Gait Assessment score to >26/30 as to reduce fall risk and improve dynamic gait safety with community ambulation.    Baseline 10/28/2020= 23/30    Time 12    Period Weeks    Status New    Target Date 01/20/21                   Plan - 11/05/20 1441     Clinical Impression Statement Pt highly motivated to participate in session. She continues to progress balance interventions with addition of dual task. Pt was most challenged with EC with head turns and tandem stance. Other interventions focused on improving pt's cardiorespiratory and LE muscular endurance as pt fatigues quickly with these tasks. The pt will benefit from further skilled PT to improve deficits in order to return to PLOF.    Personal Factors and Comorbidities Comorbidity 1    Comorbidities CVA    Examination-Activity Limitations Caring for Others;Stairs    Examination-Participation Restrictions Community Activity;Occupation;Yard Work    Stability/Clinical Decision Making Stable/Uncomplicated    Rehab Potential Good    PT Frequency 2x / week    PT Duration 12 weeks     PT Treatment/Interventions ADLs/Self Care Home Management;Cryotherapy;Moist Heat;DME Instruction;Gait training;Stair training;Functional mobility training;Therapeutic activities;Therapeutic exercise;Balance training;Neuromuscular re-education;Patient/family education;Manual techniques;Passive range of motion;Dry needling    PT Next Visit Plan Continue with progressive higher level static and dynamic standing Balance exercises    PT Home Exercise Plan 10/31/2020: Access Code: [email or web address]  (balance exercises); no updates    Consulted and Agree with Plan of Care Patient             Patient will benefit from skilled therapeutic intervention in order to improve the following deficits and impairments:  Abnormal gait, Decreased activity tolerance, Decreased balance, Decreased coordination, Decreased endurance, Cardiopulmonary status limiting activity, Decreased mobility, Decreased strength, Difficulty walking, Impaired UE functional use  Visit Diagnosis: Unsteadiness on feet  Muscle weakness (generalized)  Other abnormalities of gait and mobility     Problem List Patient Active Problem List   Diagnosis Date Noted   Anemia    Hemiparesis affecting left side as late effect of stroke (HCC)    Leukocytosis    Dyslipidemia    Spastic hemiparesis (HCC)    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   Long term current use of hormonal contraceptive 10/12/2020   Recurrent strokes (HCC) 10/11/2020   Middle cerebral artery embolism, 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 [place], [place], [address] Phone: [phone]   Fax:  [phone]  Name: KAREY SUTHERS MRN: [id number]  Date of Birth: [birth year]

## 2020-11-07 ENCOUNTER — Ambulatory Visit: Payer: Managed Care, Other (non HMO)

## 2020-11-07 ENCOUNTER — Other Ambulatory Visit: Payer: Self-pay

## 2020-11-07 DIAGNOSIS — R2681 Unsteadiness on feet: Secondary | ICD-10-CM | POA: Diagnosis not present

## 2020-11-07 DIAGNOSIS — R269 Unspecified abnormalities of gait and mobility: Secondary | ICD-10-CM

## 2020-11-07 DIAGNOSIS — M6281 Muscle weakness (generalized): Secondary | ICD-10-CM

## 2020-11-07 DIAGNOSIS — R262 Difficulty in walking, not elsewhere classified: Secondary | ICD-10-CM

## 2020-11-07 DIAGNOSIS — R278 Other lack of coordination: Secondary | ICD-10-CM

## 2020-11-07 DIAGNOSIS — I63511 Cerebral infarction due to unspecified occlusion or stenosis of right middle cerebral artery: Secondary | ICD-10-CM

## 2020-11-07 NOTE — Therapy (Signed)
[facility] MAIN [facility] SERVICES [address] [place], [place], [address] Phone: [phone]   Fax:  [phone]  Physical Therapy Treatment  Patient Details  Name: Kristen Grant MRN: [id number] Date of Birth: Nov 20, 1995 Referring Provider (PT): Mariam Dollar   Encounter Date: 11/07/2020   PT End of Session - 11/07/20 1430     Visit Number 4    Number of Visits 25    Date for PT Re-Evaluation 01/20/21    PT Start Time 1431    PT Stop Time 1504    PT Time Calculation (min) 33 min    Equipment Utilized During Treatment Gait belt    Activity Tolerance Patient tolerated treatment well    Behavior During Therapy WFL for tasks assessed/performed             Past Medical History:  Diagnosis Date   Anxiety    Asthma    no treatments required at this time   Migraine headache     Past Surgical History:  Procedure Laterality Date   BUBBLE STUDY  10/17/2020   Procedure: BUBBLE STUDY;  Surgeon: Jake Bathe, MD;  Location: [facility] ENDOSCOPY;  Service: Cardiovascular;;   IR ANGIO INTRA EXTRACRAN SEL COM CAROTID INNOMINATE UNI L MOD SED  10/11/2020   IR ANGIO VERTEBRAL SEL VERTEBRAL UNI L MOD SED  10/11/2020   IR CT HEAD LTD  10/11/2020   IR CT HEAD LTD  10/12/2020   IR PERCUTANEOUS ART THROMBECTOMY/INFUSION INTRACRANIAL INC DIAG ANGIO  10/11/2020   IR PERCUTANEOUS ART THROMBECTOMY/INFUSION INTRACRANIAL INC DIAG ANGIO  10/12/2020   NO PAST SURGERIES     RADIOLOGY WITH ANESTHESIA N/A 10/12/2020   Procedure: RADIOLOGY WITH ANESTHESIA;  Surgeon: Radiologist, Medication, MD;  Location: [facility] OR;  Service: Radiology;  Laterality: N/A;   RADIOLOGY WITH ANESTHESIA N/A 10/11/2020   Procedure: IR WITH ANESTHESIA;  Surgeon: Radiologist, Medication, MD;  Location: [facility] OR;  Service: Radiology;  Laterality: N/A;   TEE WITHOUT CARDIOVERSION N/A 10/17/2020   Procedure: TRANSESOPHAGEAL ECHOCARDIOGRAM (TEE);  Surgeon: Jake Bathe, MD;  Location: [facility] ENDOSCOPY;  Service:  Cardiovascular;  Laterality: N/A;    There were no vitals filed for this visit.   Subjective Assessment - 11/07/20 1429     Subjective Patient reports not doing much so far today and reports ongoing left hand pain.    Patient is accompained by: Family member    Pertinent History Kristen Grant is a 25 year old right-handed female with morbid obesity BMI 29.92, anxiety, migraine headaches, tobacco use.  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  Cranial CT scan showed hypodensity in the right anterior temporal lobe concerning for infarct.  Pertaining to patient's right MCA infarct predominantly right caudate putamen anterior limb right internal capsule secondary to right M1 occlusion status post revascularization mechanical thrombectomy requiring repeat thrombectomy 10/12/2020 for occluded dominant superior division of right MCA.    Limitations Walking;House hold activities    How long can you sit comfortably? no limitations    How long can you stand comfortably? 30 min    How long can you walk comfortably? 20 min    Patient Stated Goals To improve my balance and endurance back to my previous level of working full time and taking care of 5 year son    Currently in Pain? Yes    Pain Score  5     Pain Location Hand    Pain Descriptors / Indicators Aching    Pain Type Acute pain    Pain Onset In the past 7 days    Pain Frequency Intermittent    Aggravating Factors  Gripping    Pain Relieving Factors Rest, Ice    Effect of Pain on Daily Activities Limited abilitty to grip with left hand              Interventions:  Rest= HR= 61 bpm; O2 sat = 97%   TherEx-   Nustep, seat 8, level 3, pt cued for SPM >60s. Maintains SPM >60s-70s - x 6 min total.   Pt monitored for response throughout. Total distance   Biodex Treadmill --  Gait trainer pt ambulates up to 1.7 mph,  rates medium, endorsing more LE fatigue.  Gait step length= 0.55  mon left and 0.56 m on right Time on each foot= 50/50 BUE support during walk for 4 min total- Stopped due to fatigue.   Neuro - re-ed:  All activities performed in // bars today. PT provides CGA throughout.    NBOS on ariex with EC 60 sec: pt exhibits increased A/P sway  --progressed to more challenging variation with multiple reps of vertical and horizontal head-turns. Mild unsteadiness yet no UE Support today.   Dynamic high marching on blue airex pad without UE support x 25 rep- no LOB- ALT LE's.  Tandem stance on 1/2 foam roll - 2x30 sec each LE. Pt rates as difficult.   Pt educated throughout session about proper posture and technique with exercises. Improved exercise technique, movement at target joints, use of target muscles after min to mod verbal, visual, tactile cues.  Clinical Impression: Patient continues to be limited by fatigue with endurance activities. She remains well motivated throughout session and able to perform tandem standing on foam roll without significant difficulty today. She exhibited good reciprocal steps and no noticeable fatigue or foot drag on right LE today. The pt will benefit from further skilled PT to improve deficits in order to return to PLOF.                       PT Education - 11/08/20 0903     Education Details Exercise technique    Person(s) Educated Patient    Methods Explanation;Demonstration;Tactile cues;Verbal cues    Comprehension Verbalized understanding;Returned demonstration;Verbal cues required;Need further instruction;Tactile cues required              PT Short Term Goals - 10/28/20 2321       PT SHORT TERM GOAL #1   Title Pt will be independent with HEP in order to improve strength and balance in order to decrease fall risk and improve function at home and work.    Baseline 10/28/2020- Patient has no  formal HEP in place    Time 6    Period Weeks    Status New    Target Date 12/09/20               PT Long Term Goals - 10/28/20 2328       PT LONG TERM GOAL #1   Title Pt will improve FOTO to target score of 75 to  display perceived improvements in ability to complete ADL's.    Baseline 10/28/2020= 71    Time 12    Period Weeks    Status New    Target Date 01/20/21  PT LONG TERM GOAL #2   Title Pt will decrease 5TSTS by at least 3 seconds in order to demonstrate clinically significant improvement in LE strength    Baseline 10/28/2020= 14.61 sec without UE support    Time 12    Period Weeks    Status New    Target Date 01/20/21      PT LONG TERM GOAL #3   Title Patient will increase Functional Gait Assessment score to >26/30 as to reduce fall risk and improve dynamic gait safety with community ambulation.    Baseline 10/28/2020= 23/30    Time 12    Period Weeks    Status New    Target Date 01/20/21                   Plan - 11/07/20 1430     Clinical Impression Statement Patient continues to be limited by fatigue with endurance activities. She remains well motivated throughout session and able to perform tandem standing on foam roll without significant difficulty today. She exhibited good reciprocal steps and no noticeable fatigue or foot drag on right LE today. The pt will benefit from further skilled PT to improve deficits in order to return to PLOF.    Personal Factors and Comorbidities Comorbidity 1    Comorbidities CVA    Examination-Activity Limitations Caring for Others;Stairs    Examination-Participation Restrictions Community Activity;Occupation;Yard Work    Stability/Clinical Decision Making Stable/Uncomplicated    Rehab Potential Good    PT Frequency 2x / week    PT Duration 12 weeks    PT Treatment/Interventions ADLs/Self Care Home Management;Cryotherapy;Moist Heat;DME Instruction;Gait training;Stair training;Functional mobility  training;Therapeutic activities;Therapeutic exercise;Balance training;Neuromuscular re-education;Patient/family education;Manual techniques;Passive range of motion;Dry needling    PT Next Visit Plan Continue with progressive higher level static and dynamic standing Balance exercises    PT Home Exercise Plan 10/31/2020: Access Code: [email or web address]  (balance exercises); no updates    Consulted and Agree with Plan of Care Patient             Patient will benefit from skilled therapeutic intervention in order to improve the following deficits and impairments:  Abnormal gait, Decreased activity tolerance, Decreased balance, Decreased coordination, Decreased endurance, Cardiopulmonary status limiting activity, Decreased mobility, Decreased strength, Difficulty walking, Impaired UE functional use  Visit Diagnosis: Abnormality of gait and mobility  Difficulty in walking, not elsewhere classified  Muscle weakness (generalized)  Unsteadiness on feet     Problem List Patient Active Problem List   Diagnosis Date Noted   Anemia    Hemiparesis affecting left side as late effect of stroke (HCC)    Leukocytosis    Dyslipidemia    Spastic hemiparesis (HCC)    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   Long term current use of hormonal contraceptive 10/12/2020   Recurrent strokes (HCC) 10/11/2020   Middle cerebral artery embolism, 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   Nicotine vapor product user 10/06/2019   Migraines  05/09/2019   Smoker 1-2 Black & Milds/day 12/26/2018   LGSIL on Pap smear of cervix 08/18/2018   Encounter for surveillance of injectable contraceptive 08/18/2018   Generalized anxiety disorder with panic attacks 07/26/2017   ADD (attention deficit disorder) 07/07/2017   Obesity (BMI 30-39.9) 07/22/2015    Lenda Kelp, PT 11/08/2020, 9:11 AM  [facility] MAIN [facility] SERVICES [address] [place],  [place], [address] Phone: [phone]   Fax:  [phone]  Name: Kristen  ALLENA Grant MRN: [id number] Date of Birth: 1995-03-22

## 2020-11-08 NOTE — Therapy (Signed)
[facility] MAIN [facility] SERVICES [address] [place], [place], [address] Phone: [phone]   Fax:  [phone]  Occupational Therapy Treatment  Patient Details  Name: Kristen Grant MRN: [id number] Date of Birth: October 26, 1995 Referring Provider (OT): Mariam Dollar, [place]   Encounter Date: 11/07/2020   OT End of Session - 11/08/20 0810     Visit Number 4    Number of Visits 24    Date for OT Re-Evaluation 01/19/21    Authorization Type Progress report period starting 10/28/2020    OT Start Time 1330    OT Stop Time 1415    OT Time Calculation (min) 45 min    Activity Tolerance Patient tolerated treatment well    Behavior During Therapy [facility] for tasks assessed/performed             Past Medical History:  Diagnosis Date   Anxiety    Asthma    no treatments required at this time   Migraine headache     Past Surgical History:  Procedure Laterality Date   BUBBLE STUDY  10/17/2020   Procedure: BUBBLE STUDY;  Surgeon: Jake Bathe, MD;  [facility]: [facility] ENDOSCOPY;  Service: Cardiovascular;;   IR ANGIO INTRA EXTRACRAN SEL COM CAROTID INNOMINATE UNI L MOD SED  10/11/2020   IR ANGIO VERTEBRAL SEL VERTEBRAL UNI L MOD SED  10/11/2020   IR CT HEAD LTD  10/11/2020   IR CT HEAD LTD  10/12/2020   IR PERCUTANEOUS ART THROMBECTOMY/INFUSION INTRACRANIAL INC DIAG ANGIO  10/11/2020   IR PERCUTANEOUS ART THROMBECTOMY/INFUSION INTRACRANIAL INC DIAG ANGIO  10/12/2020   NO PAST SURGERIES     RADIOLOGY WITH ANESTHESIA N/A 10/12/2020   Procedure: RADIOLOGY WITH ANESTHESIA;  Surgeon: Radiologist, Medication, MD;  Location: [facility] OR;  Service: Radiology;  Laterality: N/A;   RADIOLOGY WITH ANESTHESIA N/A 10/11/2020   Procedure: IR WITH ANESTHESIA;  Surgeon: Radiologist, Medication, MD;  Location: [facility] OR;  Service: Radiology;  Laterality: N/A;   TEE WITHOUT CARDIOVERSION N/A 10/17/2020   Procedure: TRANSESOPHAGEAL ECHOCARDIOGRAM (TEE);  Surgeon: Jake Bathe, MD;  Location: [facility]  ENDOSCOPY;  Service: Cardiovascular;  Laterality: N/A;    There were no vitals filed for this visit.   Subjective Assessment - 11/08/20 0806     Subjective  "My hand still hurts."    Patient is accompanied by: Family member    Pertinent History Recent R MCA infarct on 10/11/20, underwent  mechanical thrombectomy on 10/11/20, a second time on 10/12/20.    Limitations L sided hemiparesis, decreased balance, coordination, decreased indep with ADLs/IADLs/work/leisure    Patient Stated Goals Return to work and hold her 25 y/o son.    Currently in Pain? Yes    Pain Score 5     Pain Location Hand    Pain Orientation Left    Pain Descriptors / Indicators Aching    Pain Type Acute pain    Pain Onset In the past 7 days    Pain Frequency Intermittent    Aggravating Factors  gripping    Pain Relieving Factors rest, ice    Effect of Pain on Daily Activities limited tolerance for gripping with L hand    Multiple Pain Sites No            Occupational Therapy Treatment: Neuro re-ed: Facilitated L hand FMC with small item pick up/object manipulation picking up grooved pegs from dish and placing in grooved slots.  Pt required cues to stabilize L forearm on table  top for better distal control.  Extra time needed to turn pegs between tips of fingers and thumb for necessary positioning within grooves.  Practiced isolating elbow movements and shoulder movements when reaching for pegs back and forth on table top, requiring min vc for positioning and body mechanics.  Utilized Saebo tower to facilitate reaching patterns with forward flexion and abduction of LUE.  Pt was able to place rings from first level to 2nd  level, then 2nd to 3rd level with 1 lb wrist weight donned.  Able to reach top level with 1/2 sit to stand, then brought all to bottom level.  Doffed wrist weight to move rings between 1st and 2nd level to facilitate shoulder abduction.  Frequent rest breaks needed d/t LUE fatigue.  Intermittent tactile  cues to L shoulder and trunk to minimize hiking and leaning.     Response to Treatment: Pain management strategies reinforced for L hand as pt continues to report L hand pain with gripping; gripping activities avoided this day.  Pt reports that she hasn't tried to give her son a bath yet; mother still assists with this activity, but pt will attempt next time bath is needed and will have mother present.  Pt is continuing to address strength and GMC/FMC movements throughout LUE in order to work towards return to indep with ADLs/IADLs and return to work.     OT Education - 11/08/20 0810     Education Details LUE pain management and positioning    Person(s) Educated Patient    Methods Explanation;Verbal cues    Comprehension Verbalized understanding;Verbal cues required;Need further instruction              OT Short Term Goals - 10/29/20 1247       OT SHORT TERM GOAL #1   Title Pt will perform HEP with min vc for improving LUE strength and coordination    Baseline Eval: initiated theraputty HEP at eval; further training needed    Time 6    Period Weeks    Status New    Target Date 12/08/20               OT Long Term Goals - 10/29/20 1248       OT LONG TERM GOAL #1   Title Pt will improve FOTO score to indicate improved functional performance.    Baseline Eval: incomplete FOTO, will complete next session    Time 12    Period Weeks    Status New    Target Date 01/19/21      OT LONG TERM GOAL #2   Title Pt will improve LUE strength by to enable pt to hold 5 y/o son and lift/carry heavy supplies for work and ADLs.    Baseline Eval: Pt can not lift son (35-40 lbs).  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    Time 12    Period Weeks    Status New    Target Date 01/19/21      OT LONG TERM GOAL #3   Title Pt will improve LUE GMC/FMC skills to enable her to braid her own hair.    Baseline Eval: Dependent to braid hair    Time 12     Period Weeks    Status New    Target Date 01/19/21      OT LONG TERM GOAL #4   Title Pt will improve L grip and pinch strength to enable her to  open bottles and containers with modified indep.    Baseline Eval: Requires assist to open bottles and containers (L grip 20 lbs, R 56; L lateral pinch 10 lbs, R 16, L 3 point pinch 6 lbs, R 12.    Time 12    Period Weeks    Status New    Target Date 01/19/21      OT LONG TERM GOAL #5   Title Pt will improve BUE coordination to enable indep sweeping, making beds, and other light home making tasks.    Baseline Eval: assist needed with IADL tasks which require use of 2 hands.    Time 12    Period Weeks    Status New    Target Date 01/19/21               Plan - 11/08/20 2841     Clinical Impression Statement Pain management strategies reinforced for L hand as pt continues to report L hand pain with gripping; gripping activities avoided this day.  Pt reports that she hasn't tried to give her son a bath yet; mother still assists with this activity, but pt will attempt next time bath is needed and will have mother present.  Pt is continuing to address strength and GMC/FMC movements throughout LUE in order to work towards return to indep with ADLs/IADLs and return to work.    OT Occupational Profile and History Detailed Assessment- Review of Records and additional review of physical, cognitive, psychosocial history related to current functional performance    Occupational performance deficits (Please refer to evaluation for details): ADL's;IADL's;Leisure;Work    Games developer / Function / Physical Skills ADL;Coordination;Endurance;UE functional use;Balance;Body mechanics;IADL;Dexterity;FMC;Strength;Mobility;ROM    Rehab Potential Excellent    Clinical Decision Making Several treatment options, min-mod task modification necessary    Comorbidities Affecting Occupational Performance: May have comorbidities impacting occupational performance     Modification or Assistance to Complete Evaluation  Min-Moderate modification of tasks or assist with assess necessary to complete eval    OT Frequency 2x / week    OT Duration 12 weeks    OT Treatment/Interventions Self-care/ADL training;Therapeutic exercise;DME and/or AE instruction;Neuromuscular education;Manual Therapy;Passive range of motion;Therapeutic activities;Patient/family education    Consulted and Agree with Plan of Care Patient;Family member/caregiver             Patient will benefit from skilled therapeutic intervention in order to improve the following deficits and impairments:   Body Structure / Function / Physical Skills: ADL, Coordination, Endurance, UE functional use, Balance, Body mechanics, IADL, Dexterity, FMC, Strength, Mobility, ROM       Visit Diagnosis: Muscle weakness (generalized)  Other lack of coordination  Right middle cerebral artery stroke [facility])    Problem List Patient Active Problem List   Diagnosis Date Noted   Anemia    Hemiparesis affecting left side as late effect of stroke (HCC)    Leukocytosis    Dyslipidemia    Spastic hemiparesis (HCC)    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   Long term current use of hormonal contraceptive 10/12/2020   Recurrent strokes (HCC) 10/11/2020   Middle cerebral artery embolism, 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   Nicotine vapor product user 10/06/2019   Migraines  05/09/2019   Smoker 1-2 Black & Milds/day 12/26/2018   LGSIL on Pap smear of cervix 08/18/2018   Encounter for surveillance of injectable contraceptive 08/18/2018   Generalized anxiety disorder with panic attacks 07/26/2017   ADD (attention deficit disorder)  07/07/2017   Obesity (BMI 30-39.9) 07/22/2015   Danelle Earthly, MS, OTR/L  Otis Dials, OT/L 11/08/2020, 8:24 AM  [facility] MAIN [facility] SERVICES [address] [place], [place], [address] Phone:  [phone]   Fax:  [phone]  Name: Kristen Grant MRN: [id number] Date of Birth: Apr 18, 1995

## 2020-11-08 NOTE — Patient Instructions (Signed)
Encouraged pt to continue to ice L hand x20 min as needed for pain/swelling.  Avoid repetitive gripping and other activities which cause pain.  Ensure wrist brace is worn at night time for support. Support LUE with pillows at night to elevate and prevent LUE from hanging off bed.  Pt verbalized understanding and receptive to all.

## 2020-11-11 ENCOUNTER — Encounter: Payer: Self-pay | Admitting: Occupational Therapy

## 2020-11-11 ENCOUNTER — Ambulatory Visit: Payer: Managed Care, Other (non HMO)

## 2020-11-11 ENCOUNTER — Other Ambulatory Visit: Payer: Self-pay

## 2020-11-11 ENCOUNTER — Ambulatory Visit: Payer: Managed Care, Other (non HMO) | Admitting: Occupational Therapy

## 2020-11-11 DIAGNOSIS — R262 Difficulty in walking, not elsewhere classified: Secondary | ICD-10-CM

## 2020-11-11 DIAGNOSIS — M6281 Muscle weakness (generalized): Secondary | ICD-10-CM

## 2020-11-11 DIAGNOSIS — R278 Other lack of coordination: Secondary | ICD-10-CM

## 2020-11-11 DIAGNOSIS — R2681 Unsteadiness on feet: Secondary | ICD-10-CM | POA: Diagnosis not present

## 2020-11-11 DIAGNOSIS — R269 Unspecified abnormalities of gait and mobility: Secondary | ICD-10-CM

## 2020-11-11 NOTE — Therapy (Signed)
[place] [facility] MAIN [facility] SERVICES [address] [place], [place], [address] Phone: [phone]   Fax:  [phone]  Physical Therapy Treatment  Patient Details  Name: Kristen Grant MRN: [id number] Date of Birth: 09/07/1995 Referring Provider (PT): Mariam Dollar   Encounter Date: 11/11/2020   PT End of Session - 11/11/20 1517     Visit Number 5    Number of Visits 25    Date for PT Re-Evaluation 01/20/21    PT Start Time 1515    PT Stop Time 1559    PT Time Calculation (min) 44 min    Equipment Utilized During Treatment Gait belt    Activity Tolerance Patient tolerated treatment well    Behavior During Therapy WFL for tasks assessed/performed             Past Medical History:  Diagnosis Date   Anxiety    Asthma    no treatments required at this time   Migraine headache     Past Surgical History:  Procedure Laterality Date   BUBBLE STUDY  10/17/2020   Procedure: BUBBLE STUDY;  Surgeon: Jake Bathe, MD;  [facility]: [facility] ENDOSCOPY;  Service: Cardiovascular;;   IR ANGIO INTRA EXTRACRAN SEL COM CAROTID INNOMINATE UNI L MOD SED  10/11/2020   IR ANGIO VERTEBRAL SEL VERTEBRAL UNI L MOD SED  10/11/2020   IR CT HEAD LTD  10/11/2020   IR CT HEAD LTD  10/12/2020   IR PERCUTANEOUS ART THROMBECTOMY/INFUSION INTRACRANIAL INC DIAG ANGIO  10/11/2020   IR PERCUTANEOUS ART THROMBECTOMY/INFUSION INTRACRANIAL INC DIAG ANGIO  10/12/2020   NO PAST SURGERIES     RADIOLOGY WITH ANESTHESIA N/A 10/12/2020   Procedure: RADIOLOGY WITH ANESTHESIA;  Surgeon: Radiologist, Medication, MD;  Location: [facility] OR;  Service: Radiology;  Laterality: N/A;   RADIOLOGY WITH ANESTHESIA N/A 10/11/2020   Procedure: IR WITH ANESTHESIA;  Surgeon: Radiologist, Medication, MD;  Location: [facility] OR;  Service: Radiology;  Laterality: N/A;   TEE WITHOUT CARDIOVERSION N/A 10/17/2020   Procedure: TRANSESOPHAGEAL ECHOCARDIOGRAM (TEE);  Surgeon: Jake Bathe, MD;  Location: [facility] ENDOSCOPY;  Service:  Cardiovascular;  Laterality: N/A;    There were no vitals filed for this visit.   Subjective Assessment - 11/11/20 1516     Subjective Patient reports doing well so far today- Denies any pain and no falls.    Patient is accompained by: Family member    Pertinent History Kristen Grant is a 25 year old right-handed female with morbid obesity BMI 29.92, anxiety, migraine headaches, tobacco use.  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  Cranial CT scan showed hypodensity in the right anterior temporal lobe concerning for infarct.  Pertaining to patient's right MCA infarct predominantly right caudate putamen anterior limb right internal capsule secondary to right M1 occlusion status post revascularization mechanical thrombectomy requiring repeat thrombectomy 10/12/2020 for occluded dominant superior division of right MCA.    Limitations Walking;House hold activities    How long can you sit comfortably? no limitations    How long can you stand comfortably? 30 min    How long can you walk comfortably? 20 min    Patient Stated Goals To improve my balance and endurance back to my previous level of working full time and taking care of 5 year son    Currently in Pain? No/denies    Pain Onset In  the past 7 days                Interventions:     Biodex Treadmill -- Gait trainer pt ambulates up to 0.76 m/s- 6 min- rates as medium Gait step length= 0.55  mon left and 0.55 m on right Time on each foot= 50/50 BUE on railings today.   Neuro - re-ed:  All activities performed in // bars today. PT provides CGA throughout.    NBOS on ariex with EC 60 sec: pt exhibits increased A/P sway  --progressed to more challenging variation with multiple reps of vertical and horizontal head-turns. Mild unsteadiness yet no UE Support today.    Dynamic high marching on blue airex pad without UE support x 25 rep- no LOB- ALT LE's.  Tandem stance on blue airex pads- 2x30 sec each LE. Pt rates as medium  Single leg stance  x multiple attempts - Left LE more difficult than right- x 5-20 sec (after 5 trial on left- able to hold 20 sec with practice)   Standing lunge squat using 2 blue airex  pads- 10 reps (patient rates as medium)   Standing calf raises on 1/2 white foam x 12 reps Standing toe raises on 1/2 white foam roll x 12 reps   Pt educated throughout session about proper posture and technique with exercises. Improved exercise technique, movement at target joints, use of target muscles after min to mod verbal, visual, tactile cues.  Clinical Impression: Patient able to walk on treadmill with less overall fatigue reported or observed today. She is able to take good reciprocal steps. She was challenged with some left LE weakness noticed during single leg stance and tandem standing. She was encouraged by ability to perform lunge well and ability to improve with practice during balance activities. The pt will benefit from further skilled PT to improve deficits in order to return to PLOF.                                             PT Education - 11/11/20 1516     Education Details Exercise technique    Person(s) Educated Patient    Methods Explanation;Demonstration;Tactile cues;Verbal cues    Comprehension Verbalized understanding;Returned demonstration;Verbal cues required;Need further instruction;Tactile cues required              PT Short Term Goals - 10/28/20 2321       PT SHORT TERM GOAL #1   Title Pt will be independent with HEP in order to improve strength and balance in order to decrease fall risk and improve function at home and work.    Baseline 10/28/2020- Patient has no formal HEP in place    Time 6    Period Weeks    Status New    Target Date 12/09/20                PT Long Term Goals - 10/28/20 2328       PT LONG TERM GOAL #1   Title Pt will improve FOTO to target score of 75 to  display perceived improvements in ability to complete ADL's.    Baseline 10/28/2020= 71    Time 12    Period Weeks    Status New    Target Date 01/20/21      PT LONG TERM GOAL #2   Title Pt will  decrease 5TSTS by at least 3 seconds in order to demonstrate clinically significant improvement in LE strength    Baseline 10/28/2020= 14.61 sec without UE support    Time 12    Period Weeks    Status New    Target Date 01/20/21      PT LONG TERM GOAL #3   Title Patient will increase Functional Gait Assessment score to >26/30 as to reduce fall risk and improve dynamic gait safety with community ambulation.    Baseline 10/28/2020= 23/30    Time 12    Period Weeks    Status New    Target Date 01/20/21                   Plan - 11/11/20 1517     Clinical Impression Statement Patient able to walk on treadmill with less overall fatigue reported or observed today. She is able to take good reciprocal steps. She was challenged with some left LE weakness noticed during single leg stance and tandem standing. She was encouraged by ability to perform lunge well and ability to improve with practice during balance activities. The pt will benefit from further skilled PT to improve deficits in order to return to PLOF.    Personal Factors and Comorbidities Comorbidity 1    Comorbidities CVA    Examination-Activity Limitations Caring for Others;Stairs    Examination-Participation Restrictions Community Activity;Occupation;Yard Work    Stability/Clinical Decision Making Stable/Uncomplicated    Rehab Potential Good    PT Frequency 2x / week    PT Duration 12 weeks    PT Treatment/Interventions ADLs/Self Care Home Management;Cryotherapy;Moist Heat;DME Instruction;Gait training;Stair training;Functional mobility training;Therapeutic activities;Therapeutic exercise;Balance  training;Neuromuscular re-education;Patient/family education;Manual techniques;Passive range of motion;Dry needling    PT Next Visit Plan Continue with progressive higher level static and dynamic standing Balance exercises    PT Home Exercise Plan 10/31/2020: Access Code: [email or web address]  (balance exercises); no updates    Consulted and Agree with Plan of Care Patient             Patient will benefit from skilled therapeutic intervention in order to improve the following deficits and impairments:  Abnormal gait, Decreased activity tolerance, Decreased balance, Decreased coordination, Decreased endurance, Cardiopulmonary status limiting activity, Decreased mobility, Decreased strength, Difficulty walking, Impaired UE functional use  Visit Diagnosis: Abnormality of gait and mobility  Difficulty in walking, not elsewhere classified  Muscle weakness (generalized)  Unsteadiness on feet     Problem List Patient Active Problem List   Diagnosis Date Noted   Anemia    Hemiparesis affecting left side as late effect of stroke (HCC)    Leukocytosis    Dyslipidemia    Spastic hemiparesis (HCC)    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   Long term current use of hormonal contraceptive 10/12/2020   Recurrent strokes (HCC) 10/11/2020   Middle cerebral artery embolism, 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   Nicotine vapor product user 10/06/2019   Migraines  05/09/2019   Smoker 1-2 Black & Milds/day 12/26/2018   LGSIL on Pap smear of cervix 08/18/2018   Encounter for surveillance of injectable contraceptive 08/18/2018   Generalized anxiety disorder with panic attacks 07/26/2017   ADD (attention deficit disorder) 07/07/2017   Obesity (BMI 30-39.9) 07/22/2015    Lenda Kelp, PT 11/12/2020, 12:01 PM  [place] [facility] MAIN [facility] SERVICES [address] [place], [place], [address] Phone: [phone]   Fax:   [phone]  Name:  LATANZA PFEFFERKORN MRN: [id number] Date of Birth: 1996-01-28

## 2020-11-11 NOTE — Therapy (Signed)
[place] [facility] MAIN [facility] SERVICES [address] [place], [place], [address] Phone: [phone]   Fax:  [phone]  Occupational Therapy Treatment  Patient Details  Name: Kristen Grant MRN: [id number] Date of Birth: 1995-10-18 Referring Provider (OT): Mariam Dollar, [place]   Encounter Date: 11/11/2020   OT End of Session - 11/11/20 1621     Visit Number 5    Number of Visits 24    Date for OT Re-Evaluation 01/19/21    Authorization Type Progress report period starting 10/28/2020    OT Start Time 1600    OT Stop Time 1645    OT Time Calculation (min) 45 min    Activity Tolerance Patient tolerated treatment well    Behavior During Therapy WFL for tasks assessed/performed             Past Medical History:  Diagnosis Date   Anxiety    Asthma    no treatments required at this time   Migraine headache     Past Surgical History:  Procedure Laterality Date   BUBBLE STUDY  10/17/2020   Procedure: BUBBLE STUDY;  Surgeon: Jake Bathe, MD;  [facility] ENDOS[facility];  Service: Cardiovascular;;   IR ANGIO INTRA EXTRACRAN SEL COM CAROTID INNOMINATE UNI L MOD SED  10/11/2020   IR ANGIO VERTEBRAL SEL VERTEBRAL UNI L MOD SED  10/11/2020   IR CT HEAD LTD  10/11/2020   IR CT HEAD LTD  10/12/2020   IR PERCUTANEOUS ART THROMBECTOMY/INFUSION INTRACRANIAL INC DIAG ANGIO  10/11/2020   IR PERCUTANEOUS ART THROMBECTOMY/INFUSION INTRACRANIAL INC DIAG ANGIO  10/12/2020   NO PAST SURGERIES     RADIOLOGY WITH ANESTHESIA N/A 10/12/2020   Procedure: RADIOLOGY WITH ANESTHESIA;  Surgeon: Radiologist, Medication, MD;  Location: [facility] OR;  Service: Radiology;  Laterality: N/A;   RADIOLOGY WITH ANESTHESIA N/A 10/11/2020   Procedure: IR WITH ANESTHESIA;  Surgeon: Radiologist, Medication, MD;  Location: [facility] OR;  Service: Radiology;  Laterality: N/A;   TEE WITHOUT CARDIOVERSION N/A 10/17/2020   Procedure: TRANSESOPHAGEAL ECHOCARDIOGRAM (TEE);  Surgeon: Jake Bathe, MD;  Location: [facility]  ENDOSCOPY;  Service: Cardiovascular;  Laterality: N/A;    There were no vitals filed for this visit.  OT TREATMENT    Therapeutic Ex:  Pt. Worked on BUE strengthening, and reciprocal motion using the UBE in standing for 8 min. with minimal resistance. Constant monitoring was provided   Neuro muscular re-education:  Pt. worked on grasping one inch resistive cubes alternating thumb opposition to the tip of the 2nd through 5th digits. The board was positioned at a vertical angle. Pt. worked on pressing them back into place while isolating 2nd through 5th digits. Pt. performed [facility] tasks using the Grooved pegboard. Pt. worked on grasping the grooved pegs from a horizontal position, and moving the pegs to a vertical position in the hand to prepare for placing them in the grooved slot.  Pt. worked on removing them while alternating thumb opposition to the tip of her 2nd through 5th digits.Pt. worked on [facility] skills grasping 1" sticks, 1/4" collars, and 1/4" washers. Pt. worked on storing the objects in the palm, and translatory skills moving the items from the palm of the hand to the tip of the 2nd digit, and thumb. Pt. worked on removing the pegs using bilateral alternating hand patterns.   Pt. is making progress with left hand function. Pt. reports that she is now able to use her left hand to open medication bottles, and wash  herself when bathing. Pt. reports that her left hand is feeling better today. Pt. reports that she continues to wear her splint at night, which helps with positioning. Pt. reports that she is having a difficult time sleeping at night. Pt. reports that her left hand continues to tighten up on her when using it, and alternates weightbearing with a flat had at the tabletop during tasks.  Pt. Continues to work on improving UE strength in order to work towards improving engagement of the LUE during  ADLs, and IADL tasks in order to maximize independence.                          OT Education - 11/11/20 1621     Education Details LUE pain management and positioning    Person(s) Educated Patient    Methods Explanation;Verbal cues    Comprehension Verbalized understanding;Verbal cues required;Need further instruction              OT Short Term Goals - 10/29/20 1247       OT SHORT TERM GOAL #1   Title Pt will perform HEP with min vc for improving LUE strength and coordination    Baseline Eval: initiated theraputty HEP at eval; further training needed    Time 6    Period Weeks    Status New    Target Date 12/08/20               OT Long Term Goals - 10/29/20 1248       OT LONG TERM GOAL #1   Title Pt will improve FOTO score to indicate improved functional performance.    Baseline Eval: incomplete FOTO, will complete next session    Time 12    Period Weeks    Status New    Target Date 01/19/21      OT LONG TERM GOAL #2   Title Pt will improve LUE strength by to enable pt to hold 5 y/o son and lift/carry heavy supplies for work and ADLs.    Baseline Eval: Pt can not lift son (35-40 lbs).  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    Time 12    Period Weeks    Status New    Target Date 01/19/21      OT LONG TERM GOAL #3   Title Pt will improve LUE GMC/FMC skills to enable her to braid her own hair.    Baseline Eval: Dependent to braid hair    Time 12    Period Weeks    Status New    Target Date 01/19/21      OT LONG TERM GOAL #4   Title Pt will improve L grip and pinch strength to enable her to open bottles and containers with modified indep.    Baseline Eval: Requires assist to open bottles and containers (L grip 20 lbs, R 56; L lateral pinch 10 lbs, R 16, L 3 point pinch 6 lbs, R 12.    Time 12    Period Weeks    Status New    Target Date 01/19/21      OT LONG TERM GOAL #5   Title Pt will improve BUE coordination to enable indep sweeping, making beds, and  other light home making tasks.    Baseline Eval: assist needed with IADL tasks which require use of 2 hands.    Time 12    Period  Weeks    Status New    Target Date 01/19/21                   Plan - 11/11/20 1622     Clinical Impression Statement Pt. is making progress with left hand function. Pt. reports that she is now able to use her left hand to open medication bottles, and wash herself when bathing. Pt. reports that her left hand is feeling better today. Pt. reports that she continues to wear her splint at night, which helps with positioning. Pt. reports that she is having a difficult time sleeping at night. Pt. reports that her left hand continues to tighten up on her when using it, and alternates weightbearing with a flat had at the tabletop during tasks.  Pt. Continues to work on improving UE strength in order to work towards improving engagement of the LUE during  ADLs, and IADL tasks in order to maximize independence.     OT Occupational Profile and History Detailed Assessment- Review of Records and additional review of physical, cognitive, psychosocial history related to current functional performance    Occupational performance deficits (Please refer to evaluation for details): ADL's;IADL's;Leisure;Work    Games developer / Function / Physical Skills ADL;Coordination;Endurance;UE functional use;Balance;Body mechanics;IADL;Dexterity;FMC;Strength;Mobility;ROM    Rehab Potential Excellent    Clinical Decision Making Several treatment options, min-mod task modification necessary    Comorbidities Affecting Occupational Performance: May have comorbidities impacting occupational performance    Modification or Assistance to Complete Evaluation  Min-Moderate modification of tasks or assist with assess necessary to complete eval    OT Frequency 2x / week    OT Duration 12 weeks    OT Treatment/Interventions Self-care/ADL training;Therapeutic exercise;DME and/or AE  instruction;Neuromuscular education;Manual Therapy;Passive range of motion;Therapeutic activities;Patient/family education    Consulted and Agree with Plan of Care Patient;Family member/caregiver             Patient will benefit from skilled therapeutic intervention in order to improve the following deficits and impairments:   Body Structure / Function / Physical Skills: ADL, Coordination, Endurance, UE functional use, Balance, Body mechanics, IADL, Dexterity, FMC, Strength, Mobility, ROM       Visit Diagnosis: Muscle weakness (generalized)  Other lack of coordination    Problem List Patient Active Problem List   Diagnosis Date Noted   Anemia    Hemiparesis affecting left side as late effect of stroke (HCC)    Leukocytosis    Dyslipidemia    Spastic hemiparesis (HCC)    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   Long term current use of hormonal contraceptive 10/12/2020   Recurrent strokes (HCC) 10/11/2020   Middle cerebral artery embolism, 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   Nicotine vapor product user 10/06/2019   Migraines  05/09/2019   Smoker 1-2 Black & Milds/day 12/26/2018   LGSIL on Pap smear of cervix 08/18/2018   Encounter for surveillance of injectable contraceptive 08/18/2018   Generalized anxiety disorder with panic attacks 07/26/2017   ADD (attention deficit disorder) 07/07/2017   Obesity (BMI 30-39.9) 07/22/2015    Olegario Messier, MS, OTR/L 11/11/2020, 4:25 PM  [place] [facility] MAIN [facility] SERVICES [address] [place], [place], [address] Phone: [phone]   Fax:  [phone]  Name: Kristen Grant MRN: [id number] Date of Birth: 20-Mar-1995

## 2020-11-13 ENCOUNTER — Encounter (HOSPITAL_COMMUNITY): Payer: Self-pay | Admitting: Emergency Medicine

## 2020-11-13 ENCOUNTER — Other Ambulatory Visit: Payer: Self-pay

## 2020-11-13 ENCOUNTER — Emergency Department (HOSPITAL_COMMUNITY): Payer: Managed Care, Other (non HMO)

## 2020-11-13 ENCOUNTER — Ambulatory Visit: Payer: Managed Care, Other (non HMO) | Admitting: Cardiology

## 2020-11-13 ENCOUNTER — Emergency Department (HOSPITAL_COMMUNITY)
Admission: EM | Admit: 2020-11-13 | Discharge: 2020-11-14 | Disposition: A | Discharge status: Home / Self Care | Payer: Managed Care, Other (non HMO) | Attending: Emergency Medicine | Admitting: Emergency Medicine

## 2020-11-13 DIAGNOSIS — E860 Dehydration: Secondary | ICD-10-CM | POA: Diagnosis not present

## 2020-11-13 DIAGNOSIS — Z20822 Contact with and (suspected) exposure to covid-19: Secondary | ICD-10-CM | POA: Diagnosis not present

## 2020-11-13 DIAGNOSIS — R197 Diarrhea, unspecified: Secondary | ICD-10-CM | POA: Insufficient documentation

## 2020-11-13 DIAGNOSIS — Z7982 Long term (current) use of aspirin: Secondary | ICD-10-CM | POA: Diagnosis not present

## 2020-11-13 DIAGNOSIS — R109 Unspecified abdominal pain: Secondary | ICD-10-CM | POA: Diagnosis not present

## 2020-11-13 DIAGNOSIS — R519 Headache, unspecified: Secondary | ICD-10-CM | POA: Insufficient documentation

## 2020-11-13 DIAGNOSIS — J45909 Unspecified asthma, uncomplicated: Secondary | ICD-10-CM | POA: Insufficient documentation

## 2020-11-13 DIAGNOSIS — R0789 Other chest pain: Secondary | ICD-10-CM | POA: Diagnosis not present

## 2020-11-13 DIAGNOSIS — F17210 Nicotine dependence, cigarettes, uncomplicated: Secondary | ICD-10-CM | POA: Insufficient documentation

## 2020-11-13 LAB — COMPREHENSIVE METABOLIC PANEL
ALT: 21 U/L (ref 0–44)
AST: 33 U/L (ref 15–41)
Albumin: 3.8 g/dL (ref 3.5–5.0)
Alkaline Phosphatase: 70 U/L (ref 38–126)
Anion gap: 10 (ref 5–15)
BUN: 10 mg/dL (ref 6–20)
CO2: 19 mmol/L — ABNORMAL LOW (ref 22–32)
Calcium: 9.1 mg/dL (ref 8.9–10.3)
Chloride: 104 mmol/L (ref 98–111)
Creatinine, Ser: 1.2 mg/dL — ABNORMAL HIGH (ref 0.44–1.00)
GFR, Estimated: >60 mL/min (ref >60)
Glucose, Bld: 102 mg/dL — ABNORMAL HIGH (ref 70–99)
Potassium: 3 mmol/L — ABNORMAL LOW (ref 3.5–5.1)
Sodium: 133 mmol/L — ABNORMAL LOW (ref 135–145)
Total Bilirubin: 2.2 mg/dL — ABNORMAL HIGH (ref 0.3–1.2)
Total Protein: 7 g/dL (ref 6.5–8.1)

## 2020-11-13 LAB — CBC WITH DIFFERENTIAL/PLATELET
Abs Immature Granulocytes: 0.04 10*3/uL (ref 0.00–0.07)
Basophils Absolute: 0.1 10*3/uL (ref 0.0–0.1)
Basophils Relative: 1 %
Eosinophils Absolute: 0.2 10*3/uL (ref 0.0–0.5)
Eosinophils Relative: 2 %
HCT: 40.8 % (ref 36.0–46.0)
Hemoglobin: 14 g/dL (ref 12.0–15.0)
Immature Granulocytes: 1 %
Lymphocytes Relative: 17 %
Lymphs Abs: 1.4 10*3/uL (ref 0.7–4.0)
MCH: 29.1 pg (ref 26.0–34.0)
MCHC: 34.3 g/dL (ref 30.0–36.0)
MCV: 84.8 fL (ref 80.0–100.0)
Monocytes Absolute: 1.1 10*3/uL — ABNORMAL HIGH (ref 0.1–1.0)
Monocytes Relative: 13 %
Neutro Abs: 5.7 10*3/uL (ref 1.7–7.7)
Neutrophils Relative %: 66 %
Platelets: 248 10*3/uL (ref 150–400)
RBC: 4.81 MIL/uL (ref 3.87–5.11)
RDW: 13.1 % (ref 11.5–15.5)
WBC: 8.5 10*3/uL (ref 4.0–10.5)
nRBC: 0 % (ref 0.0–0.2)

## 2020-11-13 LAB — I-STAT BETA HCG BLOOD, ED (MC, WL, AP ONLY): I-stat hCG, quantitative: 8.3 m[IU]/mL — ABNORMAL HIGH (ref <5)

## 2020-11-13 LAB — RESP PANEL BY RT-PCR (FLU A&B, COVID) ARPGX2
Influenza A by PCR: NEGATIVE
Influenza B by PCR: NEGATIVE
SARS Coronavirus 2 by RT PCR: NEGATIVE

## 2020-11-13 LAB — TROPONIN I (HIGH SENSITIVITY): Troponin I (High Sensitivity): <2 ng/L (ref <18)

## 2020-11-13 MED ORDER — ONDANSETRON 4 MG PO TBDP
8.0000 mg | ORAL_TABLET | Freq: Once | ORAL | Status: AC
  Administered 2020-11-13: 8 mg via ORAL
  Filled 2020-11-13: qty 2

## 2020-11-13 NOTE — ED Provider Notes (Signed)
Emergency Medicine Provider Triage Evaluation Note  Kristen Grant , a 25 y.o. female  was evaluated in triage.  Pt complains of chest pain.  Started yesterday, primarily right-sided and felt like tightness.  It was constant throughout the day, alleviated by sleep.  No shortness of breath, but does endorse headache.  Feels like a classic headache, not worse than normal.  Had multiple episodes of diarrhea, endorses generalized body aches.  Does not feel short of breath.  Patient was admitted to the hospital for.  Right middle cerebral artery stroke on 10/17/2020.  She is not on any anticoagulation.    Review of Systems  Positive: Chest pain, headache, diarrhea, body aches Negative: Vision changes, vomiting  Physical Exam  BP 110/83 (BP Location: Right Arm)   Pulse (!) 103   Temp 98.2 F (36.8 C) (Oral)   Resp 18   LMP  (LMP Unknown) Comment: depo shots  SpO2 100%  Gen:   Awake, no distress   Resp:  Normal effort  MSK:   Moves extremities without difficulty  Other:  Patient is tachycardic, no shortness of breath.  Lungs clear to auscultation bilaterally  Medical Decision Making  Medically screening exam initiated at 9:32 PM.  Appropriate orders placed.  Laine L Yohn was informed that the remainder of the evaluation will be completed by another provider, this initial triage assessment does not replace that evaluation, and the importance of remaining in the ED until their evaluation is complete.  Chest pain work-up, patient does have COVID symptoms.  PE possible, no chest pain today and no shortness of breath.  No pleuritic chest pain, no hypoxia.     Theron Arista, PA-C 11/13/20 2135    Milagros Loll, MD 11/14/20 [phone]

## 2020-11-13 NOTE — ED Triage Notes (Signed)
Pt reports feeling very poorly including diarrhea, nausea, weakness and general body aches for two days.

## 2020-11-14 ENCOUNTER — Ambulatory Visit: Payer: Managed Care, Other (non HMO) | Admitting: Occupational Therapy

## 2020-11-14 ENCOUNTER — Ambulatory Visit: Payer: Managed Care, Other (non HMO)

## 2020-11-14 LAB — TROPONIN I (HIGH SENSITIVITY): Troponin I (High Sensitivity): <2 ng/L (ref <18)

## 2020-11-14 MED ORDER — KETOROLAC TROMETHAMINE 30 MG/ML IJ SOLN
30.0000 mg | Freq: Once | INTRAMUSCULAR | Status: AC
  Administered 2020-11-14: 30 mg via INTRAVENOUS
  Filled 2020-11-14: qty 1

## 2020-11-14 MED ORDER — DICYCLOMINE HCL 20 MG PO TABS: 20.0000 mg | ORAL_TABLET | Freq: Two times a day (BID) | ORAL | 0 refills | Status: DC

## 2020-11-14 MED ORDER — ONDANSETRON 4 MG PO TBDP: 4.0000 mg | ORAL_TABLET | Freq: Three times a day (TID) | ORAL | 0 refills | Status: DC | PRN

## 2020-11-14 MED ORDER — LACTATED RINGERS IV BOLUS
2000.0000 mL | Freq: Once | INTRAVENOUS | Status: AC
  Administered 2020-11-14: 2000 mL via INTRAVENOUS

## 2020-11-14 MED ORDER — ONDANSETRON HCL 4 MG/2ML IJ SOLN
4.0000 mg | Freq: Once | INTRAMUSCULAR | Status: AC
  Administered 2020-11-14: 4 mg via INTRAVENOUS
  Filled 2020-11-14: qty 2

## 2020-11-14 NOTE — ED Provider Notes (Signed)
  Physical Exam  BP (!) 101/56   Pulse 66   Temp 98 F (36.7 C) (Oral)   Resp 14   Ht 5\' 9"  (1.753 m)   Wt 90.7 kg   LMP  (LMP Unknown) Comment: depo shots  SpO2 98%   BMI 29.53 kg/m   Physical Exam  ED Course/Procedures     Procedures  MDM  25yo female with history of MCA stroke presents with concern for diarrhea, fatigue. Please see previous notes for history and exam.  Feels improved after hydration in the ED, able to walk to bathroom.   Recommend continued supportive care, return for new or worsening symptoms.        , MD 11/15/20 2134

## 2020-11-14 NOTE — ED Provider Notes (Signed)
[facility] EMERGENCY DEPARTMENT Provider Note   CSN: [id number] Arrival date & time: 11/13/20  2023     History Chief Complaint - diarrhea  Vonceil L Byland is a 25 y.o. female.  The history is provided by the patient.  Diarrhea Quality:  Watery Severity:  Moderate Onset quality:  Gradual Duration:  1 day Timing:  Intermittent Progression:  Worsening Relieved by:  Nothing Worsened by:  Nothing Associated symptoms: abdominal pain and headaches   Associated symptoms: no recent cough, no fever and no vomiting   Risk factors: no recent antibiotic use   Patient with recent history of stroke presents with multiple complaints.  Patient reports over the past days she has had multiple episodes of nonbloody diarrhea.  She also reports mild abdominal discomfort and nausea.  No vomiting.  No recent antibiotics.  She also reports for over a day she has had a right-sided chest pain that appears to be worse with lying on her right side.  No shortness of breath or cough.  She reports it feels tight at times.  No pleuritic pain. Patient reports ongoing headache since discharge in the hospital.  She has residual weakness in left upper extremity since the surgery.  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   Nicotine vapor product user 10/06/2019   Migraines  05/09/2019   Smoker 1-2 Black & Milds/day 12/26/2018   LGSIL on Pap smear of  cervix 08/18/2018   Encounter for surveillance of injectable contraceptive 08/18/2018   Generalized anxiety disorder with panic attacks 07/26/2017   ADD (attention deficit disorder) 07/07/2017   Obesity (BMI 30-39.9) 07/22/2015    Past Surgical History:  Procedure Laterality Date   BUBBLE STUDY  10/17/2020   Procedure: BUBBLE STUDY;  Surgeon: Jake Bathe, MD;  [facility] END[facility];  Service: Cardiovascular;;   IR ANGIO INTRA EXTRACRAN SEL COM CAROTID INNOMINATE UNI L MOD SED  10/11/2020   IR ANGIO VERTEBRAL SEL VERTEBRAL UNI L MOD SED  10/11/2020   IR CT HEAD LTD  10/11/2020   IR CT HEAD LTD  10/12/2020   IR PERCUTANEOUS ART THROMBECTOMY/INFUSION INTRACRANIAL INC DIAG ANGIO  10/11/2020   IR PERCUTANEOUS ART THROMBECTOMY/INFUSION INTRACRANIAL INC DIAG ANGIO  10/12/2020   NO PAST SURGERIES     RADIOLOGY WITH ANESTHESIA N/A 10/12/2020   Procedure: RADIOLOGY WITH ANESTHESIA;  Surgeon: Radiologist, Medication, MD;  Location: [facility] OR;  Service: Radiology;  Laterality: N/A;   RADIOLOGY WITH ANESTHESIA N/A 10/11/2020   Procedure: IR WITH ANESTHESIA;  Surgeon: Radiologist, Medication, MD;  Location: [facility] OR;  Service: Radiology;  Laterality: N/A;   TEE WITHOUT CARDIOVERSION N/A 10/17/2020   Procedure: TRANSESOPHAGEAL ECHOCARDIOGRAM (TEE);  Surgeon: Jake Bathe, MD;  Location: [facility] ENDOSCOPY;  Service: Cardiovascular;  Laterality: N/A;     OB History     Gravida  1   Para  1   Term  1   Preterm      AB      Living  1      SAB      IAB      Ectopic      Multiple  0   Live Births  1           Family History  Problem Relation Age of Onset   Hypertension Mother    Epilepsy Mother    Anxiety disorder Mother    Depression Mother    Migraines Mother    Diabetes Maternal Grandmother    Stroke Maternal Grandmother    Hypertension Maternal Grandmother    Breast cancer Neg Hx     Social History   Tobacco Use   Smoking status: Every Day    Types: E-cigarettes    Start date:  2021   Smokeless tobacco: Never  Vaping Use   Vaping Use: Every day  Substance Use Topics   Alcohol use: Not Currently    Comment: 1-2 drinks per month   Drug use: Yes    Frequency: 3.0 times per week    Types: Marijuana    Home Medications Prior to Admission medications   Medication Sig Start Date End Date Taking? Authorizing Provider  acetaminophen (TYLENOL) 325 MG tablet Take 2 tablets (650 mg total) by mouth every 4 (four) hours as needed for mild pain (or temp > 37.5 C (99.5 F)). 10/24/20   Angiulli, Mcarthur Rossetti, PA-C  aspirin 81 MG EC tablet Take 1 tablet (81 mg total) by mouth daily. Swallow whole. 10/24/20 02/01/21  Angiulli, Mcarthur Rossetti, PA-C  atorvastatin (LIPITOR) 80 MG tablet Take 1 tablet (80 mg total) by mouth daily. 10/24/20 01/22/21  Angiulli, Mcarthur Rossetti, PA-C  Baclofen 5 MG TABS Take one tablet (5 mg) by mouth at bedtime. 10/24/20   Angiulli, Mcarthur Rossetti, PA-C  topiramate (TOPAMAX) 25 MG tablet Take 1 tablet (25 mg total) by mouth 2 (two) times daily. 11/01/20   Kirsteins, Victorino Sparrow, MD    Allergies    Patient has no known allergies.  Review of Systems   Review of Systems  Constitutional:  Positive for fatigue. Negative for fever.  Respiratory:  Negative for cough and shortness of breath.   Cardiovascular:  Positive for chest pain.  Gastrointestinal:  Positive for abdominal pain and diarrhea. Negative for vomiting.  Genitourinary:  Negative for dysuria.  Neurological:  Positive for weakness and headaches.       History of post-stroke headache  All other systems reviewed and are negative.  Physical Exam Updated Vital Signs BP (!) 99/57 (BP Location: Right Arm)   Pulse 84   Temp 98.1 F (36.7 C)   Resp 16   Ht 1.753 m (5\' 9" )   Wt 90.7 kg   LMP  (LMP Unknown) Comment: depo shots  SpO2 99%   BMI 29.53 kg/m   Physical Exam CONSTITUTIONAL: Well developed/well nourished HEAD: Normocephalic/atraumatic EYES: EOMI/PERRL ENMT: Mucous membranes moist NECK: supple no  meningeal signs SPINE/BACK:entire spine nontender CV: S1/S2 noted, no murmurs/rubs/gallops noted LUNGS: Lungs are clear to auscultation bilaterally, no apparent distress ABDOMEN: soft, nontender, no rebound or guarding, bowel sounds noted throughout abdomen GU:no cva tenderness NEURO: Pt is awake/alert/appropriate, moves all extremitiesx4.  No facial droop.   EXTREMITIES: pulses normal/equal, full ROM SKIN: warm, color normal PSYCH: no abnormalities of mood noted, alert and oriented to situation  ED Results / Procedures / Treatments   Labs (all labs  ordered are listed, but only abnormal results are displayed) Labs Reviewed  COMPREHENSIVE METABOLIC PANEL - Abnormal; Notable for the following components:      Result Value   Sodium 133 (*)    Potassium 3.0 (*)    CO2 19 (*)    Glucose, Bld 102 (*)    Creatinine, Ser 1.20 (*)    Total Bilirubin 2.2 (*)    All other components within normal limits  CBC WITH DIFFERENTIAL/PLATELET - Abnormal; Notable for the following components:   Monocytes Absolute 1.1 (*)    All other components within normal limits  I-STAT BETA HCG BLOOD, ED ([facility], [facility], [facility] ONLY) - Abnormal; Notable for the following components:   I-stat hCG, quantitative 8.3 (*)    All other components within normal limits  RESP PANEL BY RT-PCR (FLU A&B, COVID) [id number]  TROPONIN I (HIGH SENSITIVITY)  TROPONIN I (HIGH SENSITIVITY)    EKG EKG Interpretation  Date/Time:  Wednesday November 13 2020 21:45:04 EDT Ventricular Rate:  105 PR Interval:  146 QRS Duration: 82 QT Interval:  316 QTC Calculation: 417 R Axis:   59 Text Interpretation: Sinus tachycardia T wave abnormality, consider inferior ischemia Abnormal ECG Confirmed by Zadie Rhine ([id number]) on 11/14/2020 3:06:51 AM  Radiology DG Chest 2 View  Result Date: 11/13/2020 CLINICAL DATA:  Chest pain starting yesterday. EXAM: CHEST - 2 VIEW COMPARISON:  12/21/2011 FINDINGS: The heart size and mediastinal contours are  within normal limits. Both lungs are clear. The visualized skeletal structures are unremarkable. IMPRESSION: No active cardiopulmonary disease. Electronically Signed   By: Burman Nieves M.D.   On: 11/13/2020 22:19    Procedures Procedures   Medications Ordered in ED Medications  lactated ringers bolus 2,000 mL (has no administration in time range)  ondansetron (ZOFRAN) injection 4 mg (has no administration in time range)  ondansetron (ZOFRAN-ODT) disintegrating tablet 8 mg (8 mg Oral Given 11/13/20 2158)    ED Course  I have reviewed the triage vital signs and the nursing notes.  Pertinent labs & imaging results that were available during my care of the patient were reviewed by me and considered in my medical decision making (see chart for details).    MDM Rules/Calculators/A&P                           Patient with recent history of right MCA stroke with residual left-sided upper extremity weakness.  She denies any new weakness.  She has had a headache since the stroke, has been diagnosed with a post-stroke headache.  She reports it does appear to be somewhat worse with the diarrhea.  Suspect it could be exacerbated by dehydration.  Overall patient is in no acute distress.  She has no focal abdominal tenderness.  She reports the chest pain is worse with movement/palpation.  Troponin has been negative.  Chest x-ray has been personally reviewed and is negative Will give antiemetics and fluids and reassess 5:21 AM  EKG Interpretation  Date/Time:  Thursday November 14 2020 04:57:55 EDT Ventricular Rate:  70 PR Interval:  186 QRS Duration: 92 QT Interval:  396 QTC Calculation: 427 R Axis:   59 Text Interpretation: Normal sinus rhythm Normal ECG Confirmed by Zadie Rhine ([id number]) on 11/14/2020 5:20:27 AM       With improvement of her heart rate, T wave changes had improved.  Given the chest pain was right-sided, worsened with position, no pleuritic pain, no tachycardia and no  hypoxia suspicion  for ACS/PE/dissection is low. 7:24 AM Signed out to Dr. Dalene Seltzer If patient ambulates/takes PO she can be discharged as this is likely viral illness causing diarrhea Final Clinical Impression(s) / ED Diagnoses Final diagnoses:  Diarrhea, unspecified type  Dehydration    Rx / DC Orders ED Discharge Orders     None        Zadie Rhine, MD 11/14/20 0725

## 2020-11-14 NOTE — ED Notes (Signed)
Pt reporting 9/10 throbbing headache, meds given. No neuro deficits noticed at this time, left sided weakness baseline from prev stroke

## 2020-11-14 NOTE — ED Notes (Signed)
The previous shift informed this EMT that the patient has ambulated to the restroom with no issues. This EMT asked the patient if she felt like she wanted to ambulate, but she felt fatigued. This EMT informed the patient that they would return later to check on her.

## 2020-11-18 ENCOUNTER — Ambulatory Visit: Payer: Managed Care, Other (non HMO) | Admitting: Occupational Therapy

## 2020-11-18 ENCOUNTER — Other Ambulatory Visit: Payer: Self-pay

## 2020-11-18 ENCOUNTER — Encounter: Payer: Self-pay | Admitting: Occupational Therapy

## 2020-11-18 ENCOUNTER — Ambulatory Visit: Payer: Managed Care, Other (non HMO)

## 2020-11-18 DIAGNOSIS — R278 Other lack of coordination: Secondary | ICD-10-CM

## 2020-11-18 DIAGNOSIS — M6281 Muscle weakness (generalized): Secondary | ICD-10-CM

## 2020-11-18 DIAGNOSIS — R2681 Unsteadiness on feet: Secondary | ICD-10-CM

## 2020-11-18 DIAGNOSIS — R262 Difficulty in walking, not elsewhere classified: Secondary | ICD-10-CM

## 2020-11-18 DIAGNOSIS — R2689 Other abnormalities of gait and mobility: Secondary | ICD-10-CM

## 2020-11-18 DIAGNOSIS — R269 Unspecified abnormalities of gait and mobility: Secondary | ICD-10-CM

## 2020-11-18 NOTE — Therapy (Signed)
[place] [facility] MAIN [facility] SERVICES [address] [place], [place], [address] Phone: [phone]   Fax:  [phone]  Physical Therapy Treatment  Patient Details  Name: Kristen Grant MRN: [id number] Date of Birth: Mar 28, 1995 Referring Provider (PT): Mariam Dollar   Encounter Date: 11/18/2020   PT End of Session - 11/18/20 1615     Visit Number 6    Number of Visits 25    Date for PT Re-Evaluation 01/20/21    Authorization Type [employer] Managed    Authorization Time Period 10/28/20-01/20/21    PT Start Time 1603    PT Stop Time 1641    PT Time Calculation (min) 38 min    Equipment Utilized During Treatment Gait belt    Activity Tolerance Patient tolerated treatment well;No increased pain    Behavior During Therapy WFL for tasks assessed/performed             Past Medical History:  Diagnosis Date   Anxiety    Asthma    no treatments required at this time   Migraine headache     Past Surgical History:  Procedure Laterality Date   BUBBLE STUDY  10/17/2020   Procedure: BUBBLE STUDY;  Surgeon: Jake Bathe, MD;  [facility];  Service: Cardiovascular;;   IR ANGIO INTRA EXTRACRAN SEL COM CAROTID INNOMINATE UNI L MOD SED  10/11/2020   IR ANGIO VERTEBRAL SEL VERTEBRAL UNI L MOD SED  10/11/2020   IR CT HEAD LTD  10/11/2020   IR CT HEAD LTD  10/12/2020   IR PERCUTANEOUS ART THROMBECTOMY/INFUSION INTRACRANIAL INC DIAG ANGIO  10/11/2020   IR PERCUTANEOUS ART THROMBECTOMY/INFUSION INTRACRANIAL INC DIAG ANGIO  10/12/2020   NO PAST SURGERIES     RADIOLOGY WITH ANESTHESIA N/A 10/12/2020   Procedure: RADIOLOGY WITH ANESTHESIA;  Surgeon: Radiologist, Medication, MD;  Location: [facility] OR;  Service: Radiology;  Laterality: N/A;   RADIOLOGY WITH ANESTHESIA N/A 10/11/2020   Procedure: IR WITH ANESTHESIA;  Surgeon: Radiologist, Medication, MD;  Location: [facility] OR;  Service: Radiology;  Laterality: N/A;   TEE WITHOUT CARDIOVERSION N/A 10/17/2020   Procedure:  TRANSESOPHAGEAL ECHOCARDIOGRAM (TEE);  Surgeon: Jake Bathe, MD;  Location: [facility] ENDOSCOPY;  Service: Cardiovascular;  Laterality: N/A;    There were no vitals filed for this visit.   Subjective Assessment - 11/18/20 1608     Subjective Pt has been ill with NVD for as well as CP that started 6 days ago. Pt was in ER on Wednesday, workup unrevealing of any clear cardiac or pulmonary etiology.    Pertinent History Kristen Grant is a 25 year old right-handed female with morbid obesity BMI 29.92, anxiety, migraine headaches, tobacco use.  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  Cranial CT scan showed hypodensity in the right anterior temporal lobe concerning for infarct.  Pertaining to patient's right MCA infarct predominantly right caudate putamen anterior limb right internal capsule secondary to right M1 occlusion status post revascularization mechanical thrombectomy requiring repeat thrombectomy 10/12/2020 for occluded dominant superior division of right MCA.    Currently in Pain? Yes    Pain Score --   2/10 HA, 1/10 CP   Pain Location --   Deep Right sided chest pain, pressure, denies anypalpable MSK origin.           INTERVENTION  -NBOS eyes closed 2x60sec -NBOS vertical head turns  x10, horizontal head turns x20 -Dog walking with cable resistance in RUE 10x, multi direction, multi plane  -5lb AW bilat with perimeter stepping on red mat x5  -5" step up/down 180degree turn x8 -7" step up/down 180degree turn x8 -Double leg jump in place, focus on corrdinated landing x10 (HEAVY ataxia in landing mechanics)  Red in face after jumping, pt quite fatigued.       PT Short Term Goals - 10/28/20 2321       PT SHORT TERM GOAL #1   Title Pt will be independent with HEP in order to improve strength and balance in order to  decrease fall risk and improve function at home and work.    Baseline 10/28/2020- Patient has no formal HEP in place    Time 6    Period Weeks    Status New    Target Date 12/09/20               PT Long Term Goals - 10/28/20 2328       PT LONG TERM GOAL #1   Title Pt will improve FOTO to target score of 75 to  display perceived improvements in ability to complete ADL's.    Baseline 10/28/2020= 71    Time 12    Period Weeks    Status New    Target Date 01/20/21      PT LONG TERM GOAL #2   Title Pt will decrease 5TSTS by at least 3 seconds in order to demonstrate clinically significant improvement in LE strength    Baseline 10/28/2020= 14.61 sec without UE support    Time 12    Period Weeks    Status New    Target Date 01/20/21      PT LONG TERM GOAL #3   Title Patient will increase Functional Gait Assessment score to >26/30 as to reduce fall risk and improve dynamic gait safety with community ambulation.    Baseline 10/28/2020= 23/30    Time 12    Period Weeks    Status New    Target Date 01/20/21                   Plan - 11/18/20 1623     Clinical Impression Statement Continued with current plan of care as laid out in evaluation and recent prior sessions. All interventions tolerated as expected by author. Mobility and strength continue to progress as anticipated. Recovery intervals given as needed based on signs of exertion and/or pt request. Pt educated on best technique for each intervention- author uses verbal, visual, tactile cues to optimize learning. Author takes steps to maximize patient independence when appropriate. Pt remains highly motivated. Pt closely monitored throughout session for safe activity response, as well as to maximize patient safety during interventions. The patient's therapy prognosis indicates continued potential for improvement, anticipate that future progress is attainable in a reasonable/predictable timeframe. Maximum improvement is  within reach. Pt will continue to benefit from skilled PT services to address deficits and impairment identified in evaluation in order to maximize independence and safety in basic mobility required for performance of ADL, IADL, and leisure.    Personal Factors and Comorbidities Comorbidity 1    Comorbidities CVA    Examination-Activity Limitations Caring for Others;Stairs    Examination-Participation Restrictions Community Activity;Occupation;Yard Work    Stability/Clinical Decision Making Stable/Uncomplicated    Rehab Potential Good    PT Frequency 2x / week    PT Duration 12 weeks    PT  Treatment/Interventions ADLs/Self Care Home Management;Cryotherapy;Moist Heat;DME Instruction;Gait training;Stair training;Functional mobility training;Therapeutic activities;Therapeutic exercise;Balance training;Neuromuscular re-education;Patient/family education;Manual techniques;Passive range of motion;Dry needling    PT Next Visit Plan Continue with progressive higher level static and dynamic standing Balance exercises    PT Home Exercise Plan 10/31/2020: Access Code: [email or web address]  (balance exercises); no updates    Consulted and Agree with Plan of Care Patient             Patient will benefit from skilled therapeutic intervention in order to improve the following deficits and impairments:  Abnormal gait, Decreased activity tolerance, Decreased balance, Decreased coordination, Decreased endurance, Cardiopulmonary status limiting activity, Decreased mobility, Decreased strength, Difficulty walking, Impaired UE functional use  Visit Diagnosis: Muscle weakness (generalized)  Other lack of coordination  Abnormality of gait and mobility  Difficulty in walking, not elsewhere classified  Other abnormalities of gait and mobility  Unsteadiness on feet     Problem List Patient Active Problem List   Diagnosis Date Noted   Anemia    Hemiparesis affecting left side as late effect of stroke (HCC)     Leukocytosis    Dyslipidemia    Spastic hemiparesis (HCC)    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   Long term current use of hormonal contraceptive 10/12/2020   Recurrent strokes (HCC) 10/11/2020   Middle cerebral artery embolism, 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   Nicotine vapor product user 10/06/2019   Migraines  05/09/2019   Smoker 1-2 Black & Milds/day 12/26/2018   LGSIL on Pap smear of cervix 08/18/2018   Encounter for surveillance of injectable contraceptive 08/18/2018   Generalized anxiety disorder with panic attacks 07/26/2017   ADD (attention deficit disorder) 07/07/2017   Obesity (BMI 30-39.9) 07/22/2015   4:48 PM, 11/18/20 Rosamaria Lints, PT, DPT Physical Therapist - [facility]  Outpatient Physical Therapy- Main Campus [phone]     [place], PT 11/18/2020, 4:30 PM  [place] [facility] MAIN [facility] SERVICES [address] [place], [place], [address] Phone: [phone]   Fax:  [phone]  Name: TINAMARIE PRZYBYLSKI MRN: [id number] Date of Birth: 12/24/1995

## 2020-11-19 NOTE — Therapy (Signed)
[facility] MAIN [facility] SERVICES [address] [place], [place], [address] Phone: [phone]   Fax:  [phone]  Occupational Therapy Treatment  Patient Details  Name: KATIRIA CALAME MRN: [id number] Date of Birth: 26-Oct-1995 Referring Provider (OT): Mariam Dollar, [place]   Encounter Date: 11/18/2020   OT End of Session - 11/19/20 1011     Visit Number 7    Number of Visits 24    Date for OT Re-Evaluation 01/19/21    Authorization Type Progress report period starting 10/28/2020    OT Start Time 1515    OT Stop Time 1600    OT Time Calculation (min) 45 min    Activity Tolerance Patient tolerated treatment well    Behavior During Therapy WFL for tasks assessed/performed             Past Medical History:  Diagnosis Date   Anxiety    Asthma    no treatments required at this time   Migraine headache     Past Surgical History:  Procedure Laterality Date   BUBBLE STUDY  10/17/2020   Procedure: BUBBLE STUDY;  Surgeon: Jake Bathe, MD;  Location: [facility];  Service: Cardiovascular;;   IR ANGIO INTRA EXTRACRAN SEL COM CAROTID INNOMINATE UNI L MOD SED  10/11/2020   IR ANGIO VERTEBRAL SEL VERTEBRAL UNI L MOD SED  10/11/2020   IR CT HEAD LTD  10/11/2020   IR CT HEAD LTD  10/12/2020   IR PERCUTANEOUS ART THROMBECTOMY/INFUSION INTRACRANIAL INC DIAG ANGIO  10/11/2020   IR PERCUTANEOUS ART THROMBECTOMY/INFUSION INTRACRANIAL INC DIAG ANGIO  10/12/2020   NO PAST SURGERIES     RADIOLOGY WITH ANESTHESIA N/A 10/12/2020   Procedure: RADIOLOGY WITH ANESTHESIA;  Surgeon: Radiologist, Medication, MD;  Location: [facility] OR;  Service: Radiology;  Laterality: N/A;   RADIOLOGY WITH ANESTHESIA N/A 10/11/2020   Procedure: IR WITH ANESTHESIA;  Surgeon: Radiologist, Medication, MD;  Location: [facility] OR;  Service: Radiology;  Laterality: N/A;   TEE WITHOUT CARDIOVERSION N/A 10/17/2020   Procedure: TRANSESOPHAGEAL ECHOCARDIOGRAM (TEE);  Surgeon: Jake Bathe, MD;  Location: [facility]  ENDOSCOPY;  Service: Cardiovascular;  Laterality: N/A;    There were no vitals filed for this visit.   Subjective Assessment - 11/19/20 0820     Subjective  Pt. had an ER visit over the weekend for diarrhea, dehydration, and chest pain.    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    Limitations L sided hemiparesis, decreased balance, coordination, decreased indep with ADLs/IADLs/work/leisure    Patient Stated Goals Return to work and hold her 25 y/o son.    Currently in Pain? Yes    Pain Score 3     Pain Location Chest   Intermittent   Pain Descriptors / Indicators Aching    Pain Type Chronic pain            OT TREATMENT     Therapeutic Ex:   Pt. performed gross gripping with a gross grip strengthener. Pt. worked on sustaining grip while grasping pegs and reaching at various heights. The gripper was set to 17.6#, transitioning to 11.2# of grip strength resistance.    Neuro muscular re-education:   Pt. worked on [facility] skills using the [employer] Task. Pt. worked on sustaining grasp on the resistive tweezers while grasping this sticks, and moving them from a horizontal position  to a vertical position to prepare for placing them into the pegboard. Pt. required verbal cues, and cues for visual demonstration for wrist position, and hand pattern when placing them into the pegboard. Pt. transitioned from using resistive tweezers to using manual long nosed tweezers for greater success.    Pt. reports having had an ER visit this weekend secondary to chest pain, diarrhea, and dehydration. Pt. reports having 3/10 chest pain currently, however was relieved with position changes, and a pillow at her back for support. Pt. attributes chest pain to over using her RUE now that she has limited use of the LUE. Pt. reports having a long history of chest pain from anxiety  leading back to childhood.  Pt. BP 100/63, SO2 98%, and HR 68 bpms. Pt. continues to work on improving UE ROM, strength, motor control, and [facility] skills in order to work towards improving LUE functioning during ADLS, and IADL tasks.                       OT Education - 11/19/20 1011     Education Details LUE pain management and positioning    Person(s) Educated Patient    Methods Explanation;Verbal cues    Comprehension Verbalized understanding;Verbal cues required;Need further instruction              OT Short Term Goals - 10/29/20 1247       OT SHORT TERM GOAL #1   Title Pt will perform HEP with min vc for improving LUE strength and coordination    Baseline Eval: initiated theraputty HEP at eval; further training needed    Time 6    Period Weeks    Status New    Target Date 12/08/20               OT Long Term Goals - 10/29/20 1248       OT LONG TERM GOAL #1   Title Pt will improve FOTO score to indicate improved functional performance.    Baseline Eval: incomplete FOTO, will complete next session    Time 12    Period Weeks    Status New    Target Date 01/19/21      OT LONG TERM GOAL #2   Title Pt will improve LUE strength by to enable pt to hold 25 y/o son and lift/carry heavy supplies for work and ADLs.    Baseline Eval: Pt can not lift son (35-40 lbs).  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    Time 12    Period Weeks    Status New    Target Date 01/19/21      OT LONG TERM GOAL #3   Title Pt will improve LUE GMC/FMC skills to enable her to braid her own hair.    Baseline Eval: Dependent to braid hair    Time 12    Period Weeks    Status New    Target Date 01/19/21      OT LONG TERM GOAL #4   Title Pt will improve L grip and pinch strength to enable her to open bottles and containers with modified indep.    Baseline Eval: Requires assist to open bottles and containers (L grip 20 lbs, R 56; L lateral  pinch 10 lbs, R 16, L 3 point pinch 6 lbs, R 12.    Time 12    Period Weeks    Status New  Target Date 01/19/21      OT LONG TERM GOAL #5   Title Pt will improve BUE coordination to enable indep sweeping, making beds, and other light home making tasks.    Baseline Eval: assist needed with IADL tasks which require use of 2 hands.    Time 12    Period Weeks    Status New    Target Date 01/19/21                   Plan - 11/19/20 1012     Clinical Impression Statement Pt. reports having had an ER visit this weekend secondary to chest pain, diarrhea, and dehydration. Pt. reports having 3/10 chest pain currently, however was relieved with position changes, and a pillow at her back for support. Pt. attributes chest pain to over using her RUE now that she has limited use of the LUE. Pt. reports having a long history of chest pain from anxiety leading back to childhood.  Pt. BP 100/63, SO2 98%, and HR 68 bpms. Pt. continues to work on improving UE ROM, strength, motor control, and [facility] skills in order to work towards improving LUE functioning during ADLS, and IADL tasks.      OT Occupational Profile and History Detailed Assessment- Review of Records and additional review of physical, cognitive, psychosocial history related to current functional performance    Occupational performance deficits (Please refer to evaluation for details): ADL's;IADL's;Leisure;Work    Games developer / Function / Physical Skills ADL;Coordination;Endurance;UE functional use;Balance;Body mechanics;IADL;Dexterity;FMC;Strength;Mobility;ROM    Rehab Potential Excellent    Clinical Decision Making Several treatment options, min-mod task modification necessary    Comorbidities Affecting Occupational Performance: May have comorbidities impacting occupational performance    Modification or Assistance to Complete Evaluation  Min-Moderate modification of tasks or assist with assess necessary to complete eval    OT  Frequency 2x / week    OT Duration 12 weeks    OT Treatment/Interventions Self-care/ADL training;Therapeutic exercise;DME and/or AE instruction;Neuromuscular education;Manual Therapy;Passive range of motion;Therapeutic activities;Patient/family education    Consulted and Agree with Plan of Care Patient;Family member/caregiver             Patient will benefit from skilled therapeutic intervention in order to improve the following deficits and impairments:   Body Structure / Function / Physical Skills: ADL, Coordination, Endurance, UE functional use, Balance, Body mechanics, IADL, Dexterity, FMC, Strength, Mobility, ROM       Visit Diagnosis: Muscle weakness (generalized)  Other lack of coordination    Problem List Patient Active Problem List   Diagnosis Date Noted   Anemia    Hemiparesis affecting left side as late effect of stroke (HCC)    Leukocytosis    Dyslipidemia    Spastic hemiparesis (HCC)    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   Long term current use of hormonal contraceptive 10/12/2020   Recurrent strokes (HCC) 10/11/2020   Middle cerebral artery embolism, 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 [place], [place], [address]  Phone: [phone]   Fax:  [phone]  Name: JEDA PARDUE MRN: [id number] Date of Birth: 08/05/1995

## 2020-11-20 ENCOUNTER — Telehealth: Payer: Self-pay | Admitting: Physical Medicine & Rehabilitation

## 2020-11-20 NOTE — Telephone Encounter (Signed)
Per patient ok to sent FMLA ppwk to [employer] via email.  Attempted multiple times to fax to [phone]

## 2020-11-21 ENCOUNTER — Ambulatory Visit: Payer: Managed Care, Other (non HMO) | Admitting: Occupational Therapy

## 2020-11-21 ENCOUNTER — Encounter: Payer: Self-pay | Admitting: Occupational Therapy

## 2020-11-21 ENCOUNTER — Other Ambulatory Visit: Payer: Self-pay

## 2020-11-21 ENCOUNTER — Ambulatory Visit: Payer: Managed Care, Other (non HMO)

## 2020-11-21 VITALS — BP 117/78 | HR 71

## 2020-11-21 DIAGNOSIS — R2689 Other abnormalities of gait and mobility: Secondary | ICD-10-CM

## 2020-11-21 DIAGNOSIS — R278 Other lack of coordination: Secondary | ICD-10-CM

## 2020-11-21 DIAGNOSIS — R262 Difficulty in walking, not elsewhere classified: Secondary | ICD-10-CM

## 2020-11-21 DIAGNOSIS — R269 Unspecified abnormalities of gait and mobility: Secondary | ICD-10-CM

## 2020-11-21 DIAGNOSIS — R2681 Unsteadiness on feet: Secondary | ICD-10-CM | POA: Diagnosis not present

## 2020-11-21 DIAGNOSIS — I63511 Cerebral infarction due to unspecified occlusion or stenosis of right middle cerebral artery: Secondary | ICD-10-CM

## 2020-11-21 DIAGNOSIS — M6281 Muscle weakness (generalized): Secondary | ICD-10-CM

## 2020-11-21 NOTE — Therapy (Signed)
[facility] MAIN [facility] SERVICES [address] [place], [place], [address] Phone: [phone]   Fax:  [phone]  Occupational Therapy Treatment  Patient Details  Name: Kristen Grant MRN: [id number] Date of Birth: November 21, 1995 Referring Provider (OT): Mariam Dollar, [place]   Encounter Date: 11/21/2020   OT End of Session - 11/21/20 1539     Visit Number 8    Number of Visits 24    Date for OT Re-Evaluation 01/19/21    Authorization Type Progress report period starting 10/28/2020    OT Start Time 1515    OT Stop Time 1600    OT Time Calculation (min) 45 min    Activity Tolerance Patient tolerated treatment well    Behavior During Therapy WFL for tasks assessed/performed             Past Medical History:  Diagnosis Date   Anxiety    Asthma    no treatments required at this time   Migraine headache     Past Surgical History:  Procedure Laterality Date   BUBBLE STUDY  10/17/2020   Procedure: BUBBLE STUDY;  Surgeon: Jake Bathe, MD;  [facility]: [facility];  Service: Cardiovascular;;   IR ANGIO INTRA EXTRACRAN SEL COM CAROTID INNOMINATE UNI L MOD SED  10/11/2020   IR ANGIO VERTEBRAL SEL VERTEBRAL UNI L MOD SED  10/11/2020   IR CT HEAD LTD  10/11/2020   IR CT HEAD LTD  10/12/2020   IR PERCUTANEOUS ART THROMBECTOMY/INFUSION INTRACRANIAL INC DIAG ANGIO  10/11/2020   IR PERCUTANEOUS ART THROMBECTOMY/INFUSION INTRACRANIAL INC DIAG ANGIO  10/12/2020   NO PAST SURGERIES     RADIOLOGY WITH ANESTHESIA N/A 10/12/2020   Procedure: RADIOLOGY WITH ANESTHESIA;  Surgeon: Radiologist, Medication, MD;  Location: [facility] OR;  Service: Radiology;  Laterality: N/A;   RADIOLOGY WITH ANESTHESIA N/A 10/11/2020   Procedure: IR WITH ANESTHESIA;  Surgeon: Radiologist, Medication, MD;  Location: [facility] OR;  Service: Radiology;  Laterality: N/A;   TEE WITHOUT CARDIOVERSION N/A 10/17/2020   Procedure: TRANSESOPHAGEAL ECHOCARDIOGRAM (TEE);  Surgeon: Jake Bathe, MD;  Location: [facility]  ENDOSCOPY;  Service: Cardiovascular;  Laterality: N/A;    There were no vitals filed for this visit.   Subjective Assessment - 11/21/20 1537     Patient is accompanied by: Family member    Pertinent History Recent R MCA infarct on 10/11/20, underwent  mechanical thrombectomy on 10/11/20, a second time on 10/12/20.    Limitations L sided hemiparesis, decreased balance, coordination, decreased indep with ADLs/IADLs/work/leisure    Patient Stated Goals Return to work and hold her 64 y/o son.    Currently in Pain? No/denies            OT TREATMENT    Neuro muscular re-education:  Pt. worked on [facility] skills grasping 1" sticks, 1/4" collars, and 1/4" washers. Pt. worked on storing the objects in the palm, and translatory skills moving the items from the palm of the hand to the tip of the 2nd digit, and thumb. Pt. Worked on removing the collars, and washers by grasping, and storing them in the palm of her hand. Pt. worked on removing the pegs using bilateral alternating hand patterns.   Therapeutic Exercise:  Pt. performed gross gripping with a gross grip strengthener. Pt. worked on sustaining grip while grasping pegs and reaching at various heights. The gripper was set to  17.9 #, and adjusted to 11.2# of grip strength resistance. Pt. performed resistive EZ Board exercises for forearm  supination/pronation, wrist flexion/extension using gross grasp, and lateral pinch (key) grasp. Pt. performed resistive EZ Board exercises angled in several planes to promote shoulder flexion, abduction, and wrist flexion, and extension while performing resistive wrist flexion and extension with a gross grip.   Pt. reports being fatigued this week. Pt. reports that her chest pain has improved overall, and has no reports of chest pain during the session today. Pt.'s BP 100/63. Pt. requires visual cues, and cues for visual demonstration of proper technique with grasping on the EZ board, and manipulating the [employer]  pieces.  Pt. continues to work on improving UE strength, and [facility] skills in order to work towards improving, and maximizing independence with ADLs, and IADL tasks.                      OT Education - 11/21/20 1539     Education Details LUE pain management and positioning    Person(s) Educated Patient    Methods Explanation;Verbal cues    Comprehension Verbalized understanding;Verbal cues required;Need further instruction              OT Short Term Goals - 10/29/20 1247       OT SHORT TERM GOAL #1   Title Pt will perform HEP with min vc for improving LUE strength and coordination    Baseline Eval: initiated theraputty HEP at eval; further training needed    Time 6    Period Weeks    Status New    Target Date 12/08/20               OT Long Term Goals - 10/29/20 1248       OT LONG TERM GOAL #1   Title Pt will improve FOTO score to indicate improved functional performance.    Baseline Eval: incomplete FOTO, will complete next session    Time 12    Period Weeks    Status New    Target Date 01/19/21      OT LONG TERM GOAL #2   Title Pt will improve LUE strength by to enable pt to hold 5 y/o son and lift/carry heavy supplies for work and ADLs.    Baseline Eval: Pt can not lift son (35-40 lbs).  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    Time 12    Period Weeks    Status New    Target Date 01/19/21      OT LONG TERM GOAL #3   Title Pt will improve LUE GMC/FMC skills to enable her to braid her own hair.    Baseline Eval: Dependent to braid hair    Time 12    Period Weeks    Status New    Target Date 01/19/21      OT LONG TERM GOAL #4   Title Pt will improve L grip and pinch strength to enable her to open bottles and containers with modified indep.    Baseline Eval: Requires assist to open bottles and containers (L grip 20 lbs, R 56; L lateral pinch 10 lbs, R 16, L 3 point pinch 6 lbs, R 12.    Time 12    Period  Weeks    Status New    Target Date 01/19/21      OT LONG TERM GOAL #5   Title Pt will improve BUE coordination to enable indep sweeping, making beds, and other light home making tasks.  Baseline Eval: assist needed with IADL tasks which require use of 2 hands.    Time 12    Period Weeks    Status New    Target Date 01/19/21                   Plan - 11/21/20 1540     Clinical Impression Statement Pt. reports being fatigued this week. Pt. reports that her chest pain has improved overall, and has no reports of chest pain during the session today. Pt.'s BP 100/63. Pt. requires visual cues, and cues for visual demonstration of proper technique with grasping on the EZ board, and manipulating the [employer] pieces.  Pt. continues to work on improving UE strength, and [facility] skills in order to work towards improving, and maximizing independence with ADLs, and IADL tasks.      OT Occupational Profile and History Detailed Assessment- Review of Records and additional review of physical, cognitive, psychosocial history related to current functional performance    Occupational performance deficits (Please refer to evaluation for details): ADL's;IADL's;Leisure;Work    Games developer / Function / Physical Skills ADL;Coordination;Endurance;UE functional use;Balance;Body mechanics;IADL;Dexterity;FMC;Strength;Mobility;ROM    Rehab Potential Excellent    Clinical Decision Making Several treatment options, min-mod task modification necessary    Comorbidities Affecting Occupational Performance: May have comorbidities impacting occupational performance    Modification or Assistance to Complete Evaluation  Min-Moderate modification of tasks or assist with assess necessary to complete eval    OT Frequency 2x / week    OT Duration 12 weeks    OT Treatment/Interventions Self-care/ADL training;Therapeutic exercise;DME and/or AE instruction;Neuromuscular education;Manual Therapy;Passive range of  motion;Therapeutic activities;Patient/family education    Plan OT to address LUE hemiparesis and lack of coordination    Consulted and Agree with Plan of Care Patient;Family member/caregiver             Patient will benefit from skilled therapeutic intervention in order to improve the following deficits and impairments:   Body Structure / Function / Physical Skills: ADL, Coordination, Endurance, UE functional use, Balance, Body mechanics, IADL, Dexterity, FMC, Strength, Mobility, ROM       Visit Diagnosis: Muscle weakness (generalized)  Other lack of coordination    Problem List Patient Active Problem List   Diagnosis Date Noted   Anemia    Hemiparesis affecting left side as late effect of stroke (HCC)    Leukocytosis    Dyslipidemia    Spastic hemiparesis (HCC)    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   Long term current use of hormonal contraceptive 10/12/2020   Recurrent strokes (HCC) 10/11/2020   Middle cerebral artery embolism, 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   Nicotine vapor product user 10/06/2019   Migraines  05/09/2019   Smoker 1-2 Black & Milds/day 12/26/2018   LGSIL on Pap smear of cervix 08/18/2018   Encounter for surveillance of injectable contraceptive 08/18/2018   Generalized anxiety disorder with panic attacks 07/26/2017   ADD (attention deficit disorder) 07/07/2017   Obesity (BMI 30-39.9) 07/22/2015    Olegario Messier, MS, OTR/L 11/21/2020, 3:48 PM  [facility] MAIN [facility] SERVICES [address] [place], [place], [address] Phone: [phone]   Fax:  [phone]  Name: Kristen Grant MRN: [id number] Date of Birth: [birth year]

## 2020-11-22 NOTE — Therapy (Signed)
[facility] MAIN [facility] SERVICES [address] [place], [place], [address] Phone: [phone]   Fax:  [phone]  Physical Therapy Treatment  Patient Details  Name: Kristen Grant MRN: [id number] Date of Birth: Jul 24, 1995 Referring Provider (PT): Mariam Dollar   Encounter Date: 11/21/2020   PT End of Session - 11/21/20 1627     Visit Number 7    Number of Visits 25    Date for PT Re-Evaluation 01/20/21    Authorization Type [employer] Managed    Authorization Time Period 10/28/20-01/20/21    Progress Note Due on Visit 10    PT Start Time 1604    PT Stop Time 1644    PT Time Calculation (min) 40 min    Activity Tolerance Patient tolerated treatment well;No increased pain    Behavior During Therapy WFL for tasks assessed/performed             Past Medical History:  Diagnosis Date   Anxiety    Asthma    no treatments required at this time   Migraine headache     Past Surgical History:  Procedure Laterality Date   BUBBLE STUDY  10/17/2020   Procedure: BUBBLE STUDY;  Surgeon: Jake Bathe, MD;  [facility];  Service: Cardiovascular;;   IR ANGIO INTRA EXTRACRAN SEL COM CAROTID INNOMINATE UNI L MOD SED  10/11/2020   IR ANGIO VERTEBRAL SEL VERTEBRAL UNI L MOD SED  10/11/2020   IR CT HEAD LTD  10/11/2020   IR CT HEAD LTD  10/12/2020   IR PERCUTANEOUS ART THROMBECTOMY/INFUSION INTRACRANIAL INC DIAG ANGIO  10/11/2020   IR PERCUTANEOUS ART THROMBECTOMY/INFUSION INTRACRANIAL INC DIAG ANGIO  10/12/2020   NO PAST SURGERIES     RADIOLOGY WITH ANESTHESIA N/A 10/12/2020   Procedure: RADIOLOGY WITH ANESTHESIA;  Surgeon: Radiologist, Medication, MD;  [facility]: [facility] OR;  Service: Radiology;  Laterality: N/A;   RADIOLOGY WITH ANESTHESIA N/A 10/11/2020   Procedure: IR WITH ANESTHESIA;  Surgeon: Radiologist, Medication, MD;  Location: [facility] OR;  Service: Radiology;  Laterality: N/A;   TEE WITHOUT CARDIOVERSION N/A 10/17/2020   Procedure: TRANSESOPHAGEAL  ECHOCARDIOGRAM (TEE);  Surgeon: Jake Bathe, MD;  Location: [facility] ENDOSCOPY;  Service: Cardiovascular;  Laterality: N/A;    Vitals:   11/21/20 1626  BP: 117/78  Pulse: 71     Subjective Assessment - 11/21/20 1608     Subjective Pt doing well. Says no HEP because she is "lazy", but she continues to walk often.    Pertinent History [name] is a 25 year old right-handed female with morbid obesity BMI 29.92, anxiety, migraine headaches, tobacco use.  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  Cranial CT scan showed hypodensity in the right anterior temporal lobe concerning for infarct.  Pertaining to patient's right MCA infarct predominantly right caudate putamen anterior limb right internal capsule secondary to right M1 occlusion status post revascularization mechanical thrombectomy requiring repeat thrombectomy 10/12/2020 for occluded dominant superior division of right MCA.    Patient Stated Goals To improve my balance and endurance back to my previous level of working full time and taking care of 5 year son    Currently in Pain? No/denies                [facility] PT Assessment - 11/22/20 0001       ROM / Strength  AROM / PROM / Strength Strength      Strength   Strength Assessment Site Hip;Knee;Ankle    Right/Left Hip Right;Left    Right Hip Flexion 5/5   seated hip flexion x15 c 10lbAW   Right Hip External Rotation  5/5    Right Hip Internal Rotation 5/5    Right Hip ABduction --   horizontal ABDCT 5/5   Right Hip ADduction --   horizontal ADD 5/5   Left Hip Flexion 4/5    4-/5 9/23; today seated hip flexion x15 c 10lbAW "much harder," visually obvious increase in effort to perform   Left Hip External Rotation 5/5    Left Hip Internal Rotation 5/5    Left Hip ABduction --   horizontal ABDCT 5/5   Left Hip  ADduction --   horizontal ADD 5/5   Right/Left Knee Right;Left    Right Knee Flexion 5/5    Right Knee Extension 5/5   LAQ x15 c 10lbAW   Left Knee Flexion 5/5    Left Knee Extension 5/5    LAQ x15 c 10lbAW; subjectively equal effort, but mild loss of quality in motor control   Right/Left Ankle Right;Left    Right Ankle Dorsiflexion 5/5   seated ankle DF c 10lb forefoot weight x18   Right Ankle Plantar Flexion --   no tested   Left Ankle Dorsiflexion 4-/5    4-/5 4/23; today seated ankle DF c 10lb forefoot weight x14 with quick onset fatigue and loss of full range, also more slow twitch fiber dominance   Left Ankle Plantar Flexion --   not tested today (forerly 4-/5 on 9/23)     Special Tests   Other special tests Gross motor proprioception: 5xSTS eyes closed    No loss of motor quality, no change in posturla control or perceived effort.     Ambulation/Gait   Ambulation Distance (Feet) 1350 Feet   5:45   Assistive device None    Gait Pattern Step-through pattern   bilat pelvis drop (high velocity, large amplitude); high velocity Left knee IR moment from IC to midstance (see video)   Gait velocity 1.37m/s    Gait Comments Left foot drag/slap after 385ft, can correct with cued attention to heel strike without signs of or report of fatigue, but is immediately lost with dual task actiivty    LUE and LLE become somewhat hypotonic when pt is disctracted or not attending to them;             -344ft overground AMB with yellow TB tied on for elastic ankle DF assist;  *improved toe clearance, pt reports perceived assist with ankle DF in gait cycle  -10xSTS from chair hands free; no LOB, no frank weakness, but deficits in fine motor control of hip extension with oscillating rotation of hips during concentric phase, no consistent valgus angle   SLS stance with elevated hedgehog taps 1x60sec bilat  *2 hedgehogs on 1st step at 6 inches, then 2 hedgehogs behind them on 3" riser *verbal cues  given for color sequenced taps starting with 2 colors and advancing to 4 colors *no LOB, no pattern error, no signs of fatigue *quality of performance equal bilat with this task                         PT Education - 11/22/20 1002     Education Details noted foot drag left and relation to inattention versus strengthening. may benefit  from a thin spring AFO for improved toe clearance and safety for AMB distances outside of the house    Person(s) Educated Patient    Methods Explanation;Demonstration    Comprehension Verbalized understanding;Returned demonstration              PT Short Term Goals - 10/28/20 2321       PT SHORT TERM GOAL #1   Title Pt will be independent with HEP in order to improve strength and balance in order to decrease fall risk and improve function at home and work.    Baseline 10/28/2020- Patient has no formal HEP in place    Time 6    Period Weeks    Status New    Target Date 12/09/20               PT Long Term Goals - 10/28/20 2328       PT LONG TERM GOAL #1   Title Pt will improve FOTO to target score of 75 to  display perceived improvements in ability to complete ADL's.    Baseline 10/28/2020= 71    Time 12    Period Weeks    Status New    Target Date 01/20/21      PT LONG TERM GOAL #2   Title Pt will decrease 5TSTS by at least 3 seconds in order to demonstrate clinically significant improvement in LE strength    Baseline 10/28/2020= 14.61 sec without UE support    Time 12    Period Weeks    Status New    Target Date 01/20/21      PT LONG TERM GOAL #3   Title Patient will increase Functional Gait Assessment score to >26/30 as to reduce fall risk and improve dynamic gait safety with community ambulation.    Baseline 10/28/2020= 23/30    Time 12    Period Weeks    Status New    Target Date 01/20/21                   Plan - 11/22/20 1004     Clinical Impression Statement Warmup with overground AMB due to  pt reports of perceived foot drag at home. Noted left foot slap/drag after 369ft. Further examination reveals continued weakness in hip flexion and ankle AF likely contributing, however more apparent demonstration related to inattention AEB dual tasking performance. Similarly in SLS balance exercises, pt not limited frankly by her mild strength limitations, but by fine motor control limitations of the LLE which does improve with focus, does not devolve due to fatigue. Discussed safety concerns regarding toe clearance issues and potential benefit of use of light spring AFO for AMB out of house, pt is open to considering. Will revisit in future sessions. Good activity tolerance today in general despite c/o general fatigue and sleepiness. BP low today, but stable with upright activity.    Personal Factors and Comorbidities Comorbidity 1    Comorbidities CVA    Examination-Activity Limitations Caring for Others;Stairs    Examination-Participation Restrictions Community Activity;Occupation;Yard Work    Conservation officer, historic buildings Stable/Uncomplicated    Visual merchandiser    Rehab Potential Good    PT Frequency 2x / week    PT Duration 12 weeks    PT Treatment/Interventions ADLs/Self Care Home Management;Cryotherapy;Moist Heat;DME Instruction;Gait training;Stair training;Functional mobility training;Therapeutic activities;Therapeutic exercise;Balance training;Neuromuscular re-education;Patient/family education;Manual techniques;Passive range of motion;Dry needling    PT Next Visit Plan Heavy focus needed on isolated hip flexion and ankle  DF strengthening (persistent deficit); LLE fine motor control training;    PT Home Exercise Plan 10/31/2020: Access Code: [email or web address]  (balance exercises); no updates    Consulted and Agree with Plan of Care Patient             Patient will benefit from skilled therapeutic intervention in order to improve the following deficits and impairments:   Abnormal gait, Decreased activity tolerance, Decreased balance, Decreased coordination, Decreased endurance, Cardiopulmonary status limiting activity, Decreased mobility, Decreased strength, Difficulty walking, Impaired UE functional use  Visit Diagnosis: Muscle weakness (generalized)  Other lack of coordination  Abnormality of gait and mobility  Other abnormalities of gait and mobility  Unsteadiness on feet  Right middle cerebral artery stroke (HCC)  Difficulty in walking, not elsewhere classified     Problem List Patient Active Problem List   Diagnosis Date Noted   Anemia    Hemiparesis affecting left side as late effect of stroke (HCC)    Leukocytosis    Dyslipidemia    Spastic hemiparesis (HCC)    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   Long term current use of hormonal contraceptive 10/12/2020   Recurrent strokes (HCC) 10/11/2020   Middle cerebral artery embolism, 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   Nicotine vapor product user 10/06/2019   Migraines  05/09/2019   Smoker 1-2 Black & Milds/day 12/26/2018   LGSIL on Pap smear of cervix 08/18/2018   Encounter for surveillance of injectable contraceptive 08/18/2018   Generalized anxiety disorder with panic attacks 07/26/2017   ADD (attention deficit disorder) 07/07/2017   Obesity (BMI 30-39.9) 07/22/2015   10:20 AM, 11/22/20 Rosamaria Lints, PT, DPT Physical Therapist - [facility]  Outpatient Physical Therapy- Main Campus [phone]      [place], PT 11/22/2020, 10:06 AM  [facility] MAIN [facility] SERVICES [address] [place], [place], [address] Phone: [phone]   Fax:  [phone]  Name: NAVAYA WIATREK MRN: [id number] Date of Birth: April 14, 1995

## 2020-11-23 ENCOUNTER — Other Ambulatory Visit: Payer: Self-pay | Admitting: Physical Medicine & Rehabilitation

## 2020-11-25 ENCOUNTER — Encounter: Payer: Self-pay | Admitting: Occupational Therapy

## 2020-11-25 ENCOUNTER — Ambulatory Visit: Payer: Managed Care, Other (non HMO) | Admitting: Physical Therapy

## 2020-11-25 ENCOUNTER — Other Ambulatory Visit: Payer: Self-pay

## 2020-11-25 ENCOUNTER — Ambulatory Visit: Payer: Managed Care, Other (non HMO) | Admitting: Occupational Therapy

## 2020-11-25 DIAGNOSIS — R2689 Other abnormalities of gait and mobility: Secondary | ICD-10-CM

## 2020-11-25 DIAGNOSIS — M6281 Muscle weakness (generalized): Secondary | ICD-10-CM

## 2020-11-25 DIAGNOSIS — R2681 Unsteadiness on feet: Secondary | ICD-10-CM | POA: Diagnosis not present

## 2020-11-25 DIAGNOSIS — R278 Other lack of coordination: Secondary | ICD-10-CM

## 2020-11-25 DIAGNOSIS — R262 Difficulty in walking, not elsewhere classified: Secondary | ICD-10-CM

## 2020-11-25 DIAGNOSIS — R269 Unspecified abnormalities of gait and mobility: Secondary | ICD-10-CM

## 2020-11-25 NOTE — Therapy (Addendum)
[facility] MAIN [facility] SERVICES [address] [place], [place], [address] Phone: [phone]   Fax:  [phone]  Occupational Therapy Treatment  Patient Details  Name: Kristen Grant MRN: [id number] Date of Birth: 1995-09-27 Referring Provider (OT): Mariam Dollar, [place]   Encounter Date: 11/25/2020   OT End of Session - 11/25/20 1621     Visit Number 9    Number of Visits 24    Date for OT Re-Evaluation 01/19/21    Authorization Type Progress report period starting 10/28/2020    OT Start Time 1515    OT Stop Time 1600    OT Time Calculation (min) 45 min    Activity Tolerance Patient tolerated treatment well    Behavior During Therapy WFL for tasks assessed/performed             Past Medical History:  Diagnosis Date   Anxiety    Asthma    no treatments required at this time   Migraine headache     Past Surgical History:  Procedure Laterality Date   BUBBLE STUDY  10/17/2020   Procedure: BUBBLE STUDY;  Surgeon: Jake Bathe, MD;  [facility]OS[facility];  Service: Cardiovascular;;   IR ANGIO INTRA EXTRACRAN SEL COM CAROTID INNOMINATE UNI L MOD SED  10/11/2020   IR ANGIO VERTEBRAL SEL VERTEBRAL UNI L MOD SED  10/11/2020   IR CT HEAD LTD  10/11/2020   IR CT HEAD LTD  10/12/2020   IR PERCUTANEOUS ART THROMBECTOMY/INFUSION INTRACRANIAL INC DIAG ANGIO  10/11/2020   IR PERCUTANEOUS ART THROMBECTOMY/INFUSION INTRACRANIAL INC DIAG ANGIO  10/12/2020   NO PAST SURGERIES     RADIOLOGY WITH ANESTHESIA N/A 10/12/2020   Procedure: RADIOLOGY WITH ANESTHESIA;  Surgeon: Radiologist, Medication, MD;  [facility]: [facility] OR;  Service: Radiology;  Laterality: N/A;   RADIOLOGY WITH ANESTHESIA N/A 10/11/2020   Procedure: IR WITH ANESTHESIA;  Surgeon: Radiologist, Medication, MD;  [facility]: [facility] OR;  Service: Radiology;  Laterality: N/A;   TEE WITHOUT CARDIOVERSION N/A 10/17/2020   Procedure: TRANSESOPHAGEAL ECHOCARDIOGRAM (TEE);  Surgeon: Jake Bathe, MD;  Location: [facility]  ENDOSCOPY;  Service: Cardiovascular;  Laterality: N/A;    There were no vitals filed for this visit.   Subjective Assessment - 11/25/20 1616     Subjective  Pt. had an ER visit over the weekend for diarrhea, dehydration, and chest pain.    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    Currently in Pain? Yes    Pain Score 0-No pain    Pain Location Leg    Pain Orientation Right    Pain Descriptors / Indicators Aching    Pain Type Chronic pain           OT TREATMENT     Therapeutic Ex:   Pt. worked on the [employer] for 8 min. With constant monitoring of the BUEs. Pt. worked on changing, and alternating forward reverse position every 2 min. Rest breaks were required.     Neuro muscular re-education:   Pt. worked on left hand [facility] skills grasping flat marbles from the tabletop surface, and placing them onto vertical pegs. Pt. Started with 6 and was challenged with increasing the number. Pt. worked on [facility] skills using the [employer] Task. Pt. worked on sustaining grasp on the resistive tweezers while grasping this sticks, and moving them from a horizontal  position to a vertical position to prepare for placing them into the pegboard. Pt. required verbal cues, and cues for visual demonstration for wrist position, and hand pattern when placing them into the pegboard. Pt. transitioned from using resistive tweezers to using manual long nosed tweezers for greater success. Pt. worked on removing the pegs while alternating thumb opposition to the tip of her 2nd digit through thumb.    Pt. reports no pain, however reports soreness in her legs from walking a long distance [place] the [place] over the weekend.  Pt. was able to grasp, and place the flat marbles onto the vertical pegs with tipping over 2 vertical pegs. Pt. had difficulty manipulating the resistive tweezers to  grasp, and place the thin pegs. Pt. Was able to manipulate the manual long-nosed tweezers without difficulty. Pt. continues to work on improving UE ROM, strength, motor control, and [facility] skills in order to work towards improving LUE functioning during ADLs, and IADL tasks.                          OT Education - 11/25/20 1620     Education Details LUE ROM    Person(s) Educated Patient    Methods Explanation;Verbal cues    Comprehension Verbalized understanding;Verbal cues required;Need further instruction              OT Short Term Goals - 10/29/20 1247       OT SHORT TERM GOAL #1   Title Pt will perform HEP with min vc for improving LUE strength and coordination    Baseline Eval: initiated theraputty HEP at eval; further training needed    Time 6    Period Weeks    Status New    Target Date 12/08/20               OT Long Term Goals - 10/29/20 1248       OT LONG TERM GOAL #1   Title Pt will improve FOTO score to indicate improved functional performance.    Baseline Eval: incomplete FOTO, will complete next session    Time 12    Period Weeks    Status New    Target Date 01/19/21      OT LONG TERM GOAL #2   Title Pt will improve LUE strength by to enable pt to hold 5 y/o son and lift/carry heavy supplies for work and ADLs.    Baseline Eval: Pt can not lift son (35-40 lbs).  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    Time 12    Period Weeks    Status New    Target Date 01/19/21      OT LONG TERM GOAL #3   Title Pt will improve LUE GMC/FMC skills to enable her to braid her own hair.    Baseline Eval: Dependent to braid hair    Time 12    Period Weeks    Status New    Target Date 01/19/21      OT LONG TERM GOAL #4   Title Pt will improve L grip and pinch strength to enable her to open bottles and containers with modified indep.    Baseline Eval: Requires assist to open bottles and containers (L grip 20  lbs, R 56; L lateral pinch 10 lbs, R 16, L 3 point pinch 6 lbs, R 12.    Time 12    Period  Weeks    Status New    Target Date 01/19/21      OT LONG TERM GOAL #5   Title Pt will improve BUE coordination to enable indep sweeping, making beds, and other light home making tasks.    Baseline Eval: assist needed with IADL tasks which require use of 2 hands.    Time 12    Period Weeks    Status New    Target Date 01/19/21                   Plan - 11/25/20 1622     Clinical Impression Statement Pt. reports no pain, however reports soreness in her legs from walking a long distance at the [place] over the weekend.  Pt. was able to grasp, and place the flat marbles onto the vertical pegs with tipping over 2 vertical pegs. Pt. had difficulty manipulating the resistive tweezers to grasp, and place the thin pegs. Pt. Was able to manipulate the manual long-nosed tweezers without difficulty. Pt. continues to work on improving UE ROM, strength, motor control, and [facility] skills in order to work towards improving LUE functioning during ADLs, and IADL tasks.    OT Occupational Profile and History Detailed Assessment- Review of Records and additional review of physical, cognitive, psychosocial history related to current functional performance    Occupational performance deficits (Please refer to evaluation for details): ADL's;IADL's;Leisure;Work    Games developer / Function / Physical Skills ADL;Coordination;Endurance;UE functional use;Balance;Body mechanics;IADL;Dexterity;FMC;Strength;Mobility;ROM    Rehab Potential Excellent    Clinical Decision Making Several treatment options, min-mod task modification necessary    Comorbidities Affecting Occupational Performance: May have comorbidities impacting occupational performance    Modification or Assistance to Complete Evaluation  Min-Moderate modification of tasks or assist with assess necessary to complete eval    OT Frequency 2x / week    OT Duration  12 weeks    OT Treatment/Interventions Self-care/ADL training;Therapeutic exercise;DME and/or AE instruction;Neuromuscular education;Manual Therapy;Passive range of motion;Therapeutic activities;Patient/family education    Consulted and Agree with Plan of Care Patient;Family member/caregiver             Patient will benefit from skilled therapeutic intervention in order to improve the following deficits and impairments:   Body Structure / Function / Physical Skills: ADL, Coordination, Endurance, UE functional use, Balance, Body mechanics, IADL, Dexterity, FMC, Strength, Mobility, ROM       Visit Diagnosis: Muscle weakness (generalized)  Other lack of coordination    Problem List Patient Active Problem List   Diagnosis Date Noted   Anemia    Hemiparesis affecting left side as late effect of stroke (HCC)    Leukocytosis    Dyslipidemia    Spastic hemiparesis (HCC)    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   Long term current use of hormonal contraceptive 10/12/2020   Recurrent strokes (HCC) 10/11/2020   Middle cerebral artery embolism, 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 [place], [place], [address] Phone: [phone]   Fax:  [phone]  Name: Kristen Grant MRN: [id number] Date of Birth: 1995-02-09

## 2020-11-25 NOTE — Therapy (Signed)
[place] [facility] MAIN [facility] SERVICES [address] [place], [place], [address] Phone: [phone]   Fax:  [phone]  Physical Therapy Treatment  Patient Details  Name: Kristen Grant MRN: [id number] Date of Birth: [birth year] Referring Provider (PT): Mariam Dollar   Encounter Date: 11/25/2020   PT End of Session - 11/25/20 1638     Visit Number 8    Number of Visits 25    Date for PT Re-Evaluation 01/20/21    Authorization Type [employer] Managed    Authorization Time Period 10/28/20-01/20/21    Progress Note Due on Visit 10    PT Start Time 1515    PT Stop Time 1557    PT Time Calculation (min) 42 min    Equipment Utilized During Treatment Gait belt    Activity Tolerance Patient tolerated treatment well;No increased pain    Behavior During Therapy WFL for tasks assessed/performed             Past Medical History:  Diagnosis Date   Anxiety    Asthma    no treatments required at this time   Migraine headache     Past Surgical History:  Procedure Laterality Date   BUBBLE STUDY  10/17/2020   Procedure: BUBBLE STUDY;  Surgeon: Jake Bathe, MD;  [facility]: [facility] ENDOSCOPY;  Service: Cardiovascular;;   IR ANGIO INTRA EXTRACRAN SEL COM CAROTID INNOMINATE UNI L MOD SED  10/11/2020   IR ANGIO VERTEBRAL SEL VERTEBRAL UNI L MOD SED  10/11/2020   IR CT HEAD LTD  10/11/2020   IR CT HEAD LTD  10/12/2020   IR PERCUTANEOUS ART THROMBECTOMY/INFUSION INTRACRANIAL INC DIAG ANGIO  10/11/2020   IR PERCUTANEOUS ART THROMBECTOMY/INFUSION INTRACRANIAL INC DIAG ANGIO  10/12/2020   NO PAST SURGERIES     RADIOLOGY WITH ANESTHESIA N/A 10/12/2020   Procedure: RADIOLOGY WITH ANESTHESIA;  Surgeon: Radiologist, Medication, MD;  Location: [facility] OR;  Service: Radiology;  Laterality: N/A;   RADIOLOGY WITH ANESTHESIA N/A 10/11/2020   Procedure: IR WITH ANESTHESIA;  Surgeon: Radiologist, Medication, MD;  Location: [facility] OR;  Service: Radiology;  Laterality: N/A;   TEE WITHOUT  CARDIOVERSION N/A 10/17/2020   Procedure: TRANSESOPHAGEAL ECHOCARDIOGRAM (TEE);  Surgeon: Jake Bathe, MD;  Location: [facility] ENDOSCOPY;  Service: Cardiovascular;  Laterality: N/A;    There were no vitals filed for this visit.   Subjective Assessment - 11/25/20 1516     Subjective Pt doing well. [place] 12 [place], states she walked the entire time and is still fatigued today. Reports she has not performed her HEP but she does walk for 20 minutes around the block and through the grocery store. Denies falls/LOB. Endoreses pain in BLE 2/2 fatigue.    Pertinent History Kristen Grant is a 25 year old right-handed female with morbid obesity BMI 29.92, anxiety, migraine headaches, tobacco use.  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  Cranial CT scan showed hypodensity in the right anterior temporal lobe concerning for infarct.  Pertaining to patient's right MCA infarct predominantly right caudate putamen anterior limb right internal capsule secondary to right M1 occlusion status post revascularization mechanical thrombectomy requiring repeat thrombectomy 10/12/2020 for occluded dominant superior division of right MCA.    Patient Stated Goals To improve my balance and endurance back to my previous level of working  full time and taking care of 5 year son    Currently in Pain? Yes    Pain Score 6     Pain Location Leg    Pain Orientation Right;Left    Pain Descriptors / Indicators Aching;Sore                Therex   Forward lunge onto Airex pad 2x12 BLE; muscular fatigue Lateral step down on 8" step, VC on eccentric control, 2 x 10 LLE only Seated hip abduction, one LE at a time, 2 x 10 with 3 second hold BLE  Neuromuscular Re-ed   NBOS on Airex pad: - static standing, eyes  open 30 seconds - horizontal head turns, 2 x 30 seconds - vertical head turns, 2 x 30 seconds  - eyes closed, 2 x 45 seconds   Airex balance beam: 5 cones aligned in straight line; lateral stepping with toe taps to each cone. x2 down and back Double toe taps to each cone; VC to limit intermediate stepping between cones. Pt reports as challenging; multiple minor LOB with ankle and hip strategies to correct, 1 major LOB requiring UE support and CGA to correct.    Endurance  Interval training on NuStep, PT pacing pt and monitoring exertion: 1 minute, level 3, self-selected pace; 30 seconds, level 6, SPM >70; 1 minute, level 3, self-selected pace; 30 seconds, level 6, SPM >70; 1 minute, level 3, self-selected pace; 30 seconds, level 6, SPM >70; 1 minute cool down.  Pt educated throughout session about proper posture and technique with exercises. Improved exercise technique, movement at target joints, use of target muscles after min to mod verbal, visual, tactile cues.     Clinical Impression: Pt presented with good motivation throughout session albeit reported muscular fatigue and soreness in BLE after a busy weekend. Seated rest breaks taken throughout due to muscular fatigue. Pt was challenged with eyes closed and double toe taps to cones, both on compliant surfaces. Pt did utilize ankle and hip strategies effectively to correct minor LOB. Overall, strengthening and endurance activities were limited due to pt fatigue. On NuStep, pt reported mild pain developing in RLE at end of session. PT ended endurance training at this point to avoid aggravating further pain. Pt will benefit from further skilled PT to improve deficits in order to return to PLOF.          PT Short Term Goals - 10/28/20 2321       PT SHORT TERM GOAL #1   Title Pt will be independent with HEP in order to improve strength and balance in order to decrease fall risk and improve function at home and work.    Baseline  10/28/2020- Patient has no formal HEP in place    Time 6    Period Weeks    Status New    Target Date 12/09/20               PT Long Term Goals - 10/28/20 2328       PT LONG TERM GOAL #1   Title Pt will improve FOTO to target score of 75 to  display perceived improvements in ability to complete ADL's.    Baseline 10/28/2020= 71    Time 12    Period Weeks    Status New    Target Date 01/20/21      PT LONG TERM GOAL #2   Title Pt will decrease 5TSTS by at least 3 seconds in order to demonstrate clinically  significant improvement in LE strength    Baseline 10/28/2020= 14.61 sec without UE support    Time 12    Period Weeks    Status New    Target Date 01/20/21      PT LONG TERM GOAL #3   Title Patient will increase Functional Gait Assessment score to >26/30 as to reduce fall risk and improve dynamic gait safety with community ambulation.    Baseline 10/28/2020= 23/30    Time 12    Period Weeks    Status New    Target Date 01/20/21                   Plan - 11/25/20 1640     Clinical Impression Statement Pt presented with good motivation throughout session albeit reported muscular fatigue and soreness in BLE after a busy weekend. Seated rest breaks taken throughout due to muscular fatigue. Pt was challenged with eyes closed and double toe taps to cones, both on compliant surfaces. Pt did utilize ankle and hip strategies effectively to correct minor LOB. Overall, strengthening and endurance activities were limited due to pt fatigue. On NuStep, pt reported mild pain developing in RLE at end of session. PT ended endurance training at this point to avoid aggravating further pain. Pt will benefit from further skilled PT to improve deficits in order to return to PLOF.    Personal Factors and Comorbidities Comorbidity 1    Comorbidities CVA    Examination-Activity Limitations Caring for Others;Stairs    Examination-Participation Restrictions Community Activity;Occupation;Yard  Work    Stability/Clinical Decision Making Stable/Uncomplicated    Rehab Potential Good    PT Frequency 2x / week    PT Duration 12 weeks    PT Treatment/Interventions ADLs/Self Care Home Management;Cryotherapy;Moist Heat;DME Instruction;Gait training;Stair training;Functional mobility training;Therapeutic activities;Therapeutic exercise;Balance training;Neuromuscular re-education;Patient/family education;Manual techniques;Passive range of motion;Dry needling    PT Next Visit Plan Heavy focus needed on isolated hip flexion and ankle DF strengthening (persistent deficit); LLE fine motor control training;    PT Home Exercise Plan 10/31/2020: Access Code: [email or web address]  (balance exercises); no updates    Consulted and Agree with Plan of Care Patient             Patient will benefit from skilled therapeutic intervention in order to improve the following deficits and impairments:  Abnormal gait, Decreased activity tolerance, Decreased balance, Decreased coordination, Decreased endurance, Cardiopulmonary status limiting activity, Decreased mobility, Decreased strength, Difficulty walking, Impaired UE functional use  Visit Diagnosis: Abnormality of gait and mobility  Other lack of coordination  Difficulty in walking, not elsewhere classified  Unsteadiness on feet  Muscle weakness (generalized)  Other abnormalities of gait and mobility     Problem List Patient Active Problem List   Diagnosis Date Noted   Anemia    Hemiparesis affecting left side as late effect of stroke (HCC)    Leukocytosis    Dyslipidemia    Spastic hemiparesis (HCC)    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   Long term current use of hormonal contraceptive 10/12/2020   Recurrent strokes (HCC) 10/11/2020   Middle cerebral artery embolism, 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 [place], [place], [address] Phone: [phone]   Fax:  [phone]  Name: Kristen Grant MRN: [id number] Date of Birth: 07/18/1995

## 2020-11-27 ENCOUNTER — Ambulatory Visit: Payer: Managed Care, Other (non HMO)

## 2020-11-27 ENCOUNTER — Ambulatory Visit: Payer: Managed Care, Other (non HMO) | Admitting: Occupational Therapy

## 2020-11-27 ENCOUNTER — Other Ambulatory Visit: Payer: Self-pay

## 2020-11-27 ENCOUNTER — Encounter: Payer: Self-pay | Admitting: Occupational Therapy

## 2020-11-27 DIAGNOSIS — R278 Other lack of coordination: Secondary | ICD-10-CM

## 2020-11-27 DIAGNOSIS — R2681 Unsteadiness on feet: Secondary | ICD-10-CM | POA: Diagnosis not present

## 2020-11-27 DIAGNOSIS — M6281 Muscle weakness (generalized): Secondary | ICD-10-CM

## 2020-11-27 NOTE — Therapy (Signed)
[place] [facility] MAIN [facility] SERVICES [address] [place], [place], [address] Phone: [phone]   Fax:  [phone]  Occupational Therapy Progress Note  Dates of reporting period  10/28/2020   to   11/27/2020   Patient Details  Name: Kristen Grant MRN: [id number] Date of Birth: 18-Jan-1996 Referring Provider (OT): Mariam Dollar, [place]   Encounter Date: 11/27/2020   OT End of Session - 11/27/20 1621     Visit Number 10    Number of Visits 24    Date for OT Re-Evaluation 01/19/21    Authorization Type Progress report period starting 10/28/2020    OT Start Time 1600    OT Stop Time 1645    OT Time Calculation (min) 45 min    Activity Tolerance Patient tolerated treatment well    Behavior During Therapy WFL for tasks assessed/performed             Past Medical History:  Diagnosis Date   Anxiety    Asthma    no treatments required at this time   Migraine headache     Past Surgical History:  Procedure Laterality Date   BUBBLE STUDY  10/17/2020   Procedure: BUBBLE STUDY;  Surgeon: Jake Bathe, MD;  [facility] END[facility];  Service: Cardiovascular;;   IR ANGIO INTRA EXTRACRAN SEL COM CAROTID INNOMINATE UNI L MOD SED  10/11/2020   IR ANGIO VERTEBRAL SEL VERTEBRAL UNI L MOD SED  10/11/2020   IR CT HEAD LTD  10/11/2020   IR CT HEAD LTD  10/12/2020   IR PERCUTANEOUS ART THROMBECTOMY/INFUSION INTRACRANIAL INC DIAG ANGIO  10/11/2020   IR PERCUTANEOUS ART THROMBECTOMY/INFUSION INTRACRANIAL INC DIAG ANGIO  10/12/2020   NO PAST SURGERIES     RADIOLOGY WITH ANESTHESIA N/A 10/12/2020   Procedure: RADIOLOGY WITH ANESTHESIA;  Surgeon: Radiologist, Medication, MD;  Location: [facility] OR;  Service: Radiology;  Laterality: N/A;   RADIOLOGY WITH ANESTHESIA N/A 10/11/2020   Procedure: IR WITH ANESTHESIA;  Surgeon: Radiologist, Medication, MD;  Location: [facility] OR;  Service: Radiology;  Laterality: N/A;   TEE WITHOUT CARDIOVERSION N/A 10/17/2020   Procedure: TRANSESOPHAGEAL  ECHOCARDIOGRAM (TEE);  Surgeon: Jake Bathe, MD;  Location: [facility] ENDOSCOPY;  Service: Cardiovascular;  Laterality: N/A;    There were no vitals filed for this visit.   Subjective Assessment - 11/27/20 1616     Subjective  Pt. had an ER visit over the weekend for diarrhea, dehydration, and chest pain.    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    Limitations L sided hemiparesis, decreased balance, coordination, decreased indep with ADLs/IADLs/work/leisure    Currently in Pain? Yes    Pain Score 1     Pain Location Sternum    Pain Orientation Right    Pain Descriptors / Indicators Aching                [facility] OT Assessment - 11/27/20 1640       Coordination   Left 9 Hole Peg Test 28 sec.      Strength   Overall Strength Comments Left shoulder flexion/abduction 4-/5, elbow flexion/extension 4/5, wrist extension 4/5      Hand Function   Left Hand Grip (lbs) 25    Left Hand Lateral Pinch 11 lbs    Left 3 point pinch 8 lbs  OT TREATMENT    Neuro muscular re-education:  Pt. worked on left hand [facility] skills grasping small mini [employer] pieces, and placed them onto the board following a design pattern. Pt. worked on removing the pegs by grasping one piece at a time, and storing them in the palm of her hand. Pt. required a pillow at her back for positional support secondary to sternal pain.  Therapeutic Exercise:  Pt. worked on the [employer] for 8 min. with constant monitoring of the BUEs. Pt. worked on changing, and alternating forward reverse position every 2 min. rest breaks were required.    Measurements were obtained, and goals were reviewed with the pt. Pt. has made excellent progress overall with the LUE strength, grip strength, pinch strength, and FMC skills. Pt. is now using her left hand more during daily ADL, and IADL tasks at home,  and is able to open bottles. Pt.'s FOTO score is 77, which reflects a 29 point increase from the intake score. TR score 60.  Pt. continues to work on improving LUE strength, grip strength, pinch strength, and FMC skills in order to work towards improving, and maximizing independence with ADLs, and IADL tasks.                   OT Education - 11/27/20 1621     Education Details LUE ROM    Person(s) Educated Patient    Methods Explanation;Verbal cues    Comprehension Verbalized understanding;Verbal cues required;Need further instruction              OT Short Term Goals - 10/29/20 1247       OT SHORT TERM GOAL #1   Title Pt will perform HEP with min vc for improving LUE strength and coordination    Baseline Eval: initiated theraputty HEP at eval; further training needed    Time 6    Period Weeks    Status New    Target Date 12/08/20               OT Long Term Goals - 11/27/20 1622       OT LONG TERM GOAL #1   Title Pt will improve FOTO score to indicate improved functional performance.    Baseline 10th visit:  FOTO score: 77 Eval: incomplete FOTO, will complete next session    Time 12    Period Weeks    Status On-going    Target Date 01/19/21      OT LONG TERM GOAL #2   Title Pt will improve LUE strength by to enable pt to hold 5 y/o son and lift/carry heavy supplies for work and ADLs.    Baseline 1)th visit: UE strength is improving. Pt. is able to lift her son out of a shopping cart, however is unable to hold him for a duration.  Eval: Pt can not lift son (35-40 lbs).  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    Time 12    Period Weeks    Status On-going    Target Date 01/19/21      OT LONG TERM GOAL #3   Title Pt will improve LUE GMC/FMC skills to enable her to braid her own hair.    Baseline 10th visit: Pt. has not tried due to cutting her hair, however would like to keep this goal. Eval: Dependent to braid hair     Time 12    Period Weeks    Status  On-going    Target Date 01/19/21      OT LONG TERM GOAL #4   Title Pt will improve L grip and pinch strength to enable her to open bottles and containers with modified indep.    Baseline 10th visit: Left grip strength: 25 lbs, lateral pInch strength: 11#, 3pt. pinch: 8#  pt. is able to open bottles. Eval: Requires assist to open bottles and containers (L grip 20 lbs, R 56; L lateral pinch 10 lbs, R 16, L 3 point pinch 6 lbs, R 12.    Time 12    Period Weeks    Status On-going    Target Date 01/19/21      OT LONG TERM GOAL #5   Title Pt will improve BUE coordination to enable indep sweeping, making beds, and other light home making tasks.    Baseline 10th visit: Pt. is able to perfrom laundry, and dishes. Pt. requires assist for bedmaking tasks. Eval: assist needed with IADL tasks which require use of 2 hands.    Time 12    Period Weeks    Status On-going                   Plan - 11/27/20 1621     Clinical Impression Statement Measurements were obtained, and goals were reviewed with the pt. Pt. has made excellent progress overall with the LUE strength, grip strength, pinch strength, and FMC skills. Pt. is now using her left hand more during daily ADL, and IADL tasks at home, and is able to open bottles. Pt.'s FOTO score is 77, which reflects a 29 point increase from the intake score. TR score 60.  Pt. continues to work on improving LUE strength, grip strength, pinch strength, and FMC skills in order to work towards improving, and maximizing independence with ADLs, and IADL tasks.   OT Occupational Profile and History Detailed Assessment- Review of Records and additional review of physical, cognitive, psychosocial history related to current functional performance    Occupational performance deficits (Please refer to evaluation for details): ADL's;IADL's;Leisure;Work    Games developer / Function / Physical Skills ADL;Coordination;Endurance;UE  functional use;Balance;Body mechanics;IADL;Dexterity;FMC;Strength;Mobility;ROM    Rehab Potential Excellent    Clinical Decision Making Several treatment options, min-mod task modification necessary    Comorbidities Affecting Occupational Performance: May have comorbidities impacting occupational performance    Modification or Assistance to Complete Evaluation  Min-Moderate modification of tasks or assist with assess necessary to complete eval    OT Frequency 2x / week    OT Duration 12 weeks    OT Treatment/Interventions Self-care/ADL training;Therapeutic exercise;DME and/or AE instruction;Neuromuscular education;Manual Therapy;Passive range of motion;Therapeutic activities;Patient/family education    Consulted and Agree with Plan of Care Patient;Family member/caregiver             Patient will benefit from skilled therapeutic intervention in order to improve the following deficits and impairments:   Body Structure / Function / Physical Skills: ADL, Coordination, Endurance, UE functional use, Balance, Body mechanics, IADL, Dexterity, FMC, Strength, Mobility, ROM       Visit Diagnosis: Muscle weakness (generalized)  Other lack of coordination    Problem List Patient Active Problem List   Diagnosis Date Noted   Anemia    Hemiparesis affecting left side as late effect of stroke (HCC)    Leukocytosis    Dyslipidemia    Spastic hemiparesis (HCC)    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   Long term current use of hormonal contraceptive  10/12/2020   Recurrent strokes (HCC) 10/11/2020   Middle cerebral artery embolism, 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 [place], [place], [address] Phone: [phone]   Fax:  [phone]  Name: Kristen Grant MRN: [id number] Date of Birth: 23-May-1995

## 2020-11-27 NOTE — Therapy (Signed)
[facility] MAIN [facility] SERVICES [address] [place], [place], [address] Phone: [phone]   Fax:  [phone]  Physical Therapy Treatment  Patient Details  Name: Kristen Grant MRN: [id number] Date of Birth: Oct 30, 1995 Referring Provider (PT): Mariam Dollar   Encounter Date: 11/27/2020   PT End of Session - 11/27/20 2022     Visit Number 9    Number of Visits 25    Date for PT Re-Evaluation 01/20/21    Authorization Type [employer] Managed    Authorization Time Period 10/28/20-01/20/21    Progress Note Due on Visit 10    PT Start Time 1518    PT Stop Time 1557    PT Time Calculation (min) 39 min    Equipment Utilized During Treatment Gait belt    Activity Tolerance Patient tolerated treatment well;No increased pain    Behavior During Therapy WFL for tasks assessed/performed             Past Medical History:  Diagnosis Date   Anxiety    Asthma    no treatments required at this time   Migraine headache     Past Surgical History:  Procedure Laterality Date   BUBBLE STUDY  10/17/2020   Procedure: BUBBLE STUDY;  Surgeon: Jake Bathe, MD;  [facility]: [facility] ENDOSCOPY;  Service: Cardiovascular;;   IR ANGIO INTRA EXTRACRAN SEL COM CAROTID INNOMINATE UNI L MOD SED  10/11/2020   IR ANGIO VERTEBRAL SEL VERTEBRAL UNI L MOD SED  10/11/2020   IR CT HEAD LTD  10/11/2020   IR CT HEAD LTD  10/12/2020   IR PERCUTANEOUS ART THROMBECTOMY/INFUSION INTRACRANIAL INC DIAG ANGIO  10/11/2020   IR PERCUTANEOUS ART THROMBECTOMY/INFUSION INTRACRANIAL INC DIAG ANGIO  10/12/2020   NO PAST SURGERIES     RADIOLOGY WITH ANESTHESIA N/A 10/12/2020   Procedure: RADIOLOGY WITH ANESTHESIA;  Surgeon: Radiologist, Medication, MD;  Location: [facility] OR;  Service: Radiology;  Laterality: N/A;   RADIOLOGY WITH ANESTHESIA N/A 10/11/2020   Procedure: IR WITH ANESTHESIA;  Surgeon: Radiologist, Medication, MD;  Location: [facility] OR;  Service: Radiology;  Laterality: N/A;   TEE WITHOUT  CARDIOVERSION N/A 10/17/2020   Procedure: TRANSESOPHAGEAL ECHOCARDIOGRAM (TEE);  Surgeon: Jake Bathe, MD;  Location: [facility] ENDOSCOPY;  Service: Cardiovascular;  Laterality: N/A;    There were no vitals filed for this visit.   Subjective Assessment - 11/27/20 2018     Subjective Pt reports no pain currently but some soreness/fatigue in BLEs remaining from spending 12 hours at the [employer] over the weekend. Pt reports feeling good about her balance while at the fair and that she did a lot of walking over various surfaces without issue except for increased fatigue after several hours. Pt reports no falls or near-falls.    Patient is accompained by: Family member    Pertinent History Kristen Grant is a 25 year old right-handed female with morbid obesity BMI 29.92, anxiety, migraine headaches, tobacco use.  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  Cranial CT scan showed hypodensity in the right anterior temporal lobe concerning for infarct.  Pertaining to patient's right MCA infarct predominantly right caudate putamen anterior limb right internal capsule secondary to right M1 occlusion status post revascularization mechanical thrombectomy requiring repeat thrombectomy 10/12/2020 for occluded dominant superior division of right MCA.    Limitations Walking;House hold activities  How long can you sit comfortably? no limitations    How long can you stand comfortably? 30 min    How long can you walk comfortably? 20 min    Patient Stated Goals To improve my balance and endurance back to my previous level of working full time and taking care of 5 year son    Currently in Pain? No/denies    Pain Onset In the past 7 days             Therex     Continued interval training on NuStep, for cardiorespiratory and LE muscular endurance  with PT pacing pt and monitoring exertion: 1 minute, level 3, self-selected pace; 30 seconds, level 6, SPM >70; 1 minute, level 3, self-selected pace; 30 seconds, level 6, SPM >70; 1 minute, level 3, self-selected pace; 30 seconds, level 6, SPM >70; 1 minute cool down. Pt reports exercise is challenging.   Forward lunge onto Airex pad 1x10 BLEs; pt reports exercise is somewhat fatiguing.   Leg press: 40# 1x10 55# 2x15; rates moderate challenging  Standing heel raies 20x; reports increased fatigue/difficulty.   Neuromuscular Re-ed   At support surface with CGA throughout -   NBOS on Airex pad: Pt initially standing with EO, exhibits no decrease in postural stability Pt then performs standing in semi-tandem, EO, 2x30 sec each LE -progression to use of horizontal, vertical head turns x multiple reps for each LE position; intermittent UE support, improves with reps Pt then performs tandem stance 2x30 sec each LE. Rates more challenging but no LOB --progresses to addition of horizontal, vertical head turns with LEs in each position    Airex balance beam: 5 cones aligned in straight line; lateral stepping with toe taps to each cone. 8x length of beam. Pt incorporates cross-over tap as well. Intermittent UE support --modified from lateral stepping on airex beam to tandem walking. Cones placed on R and L sides. Pt taps cones on R with RLE and on L with LLE. Intermittent UE support required.       Pt educated throughout session about proper posture and technique with exercises. Improved exercise technique, movement at target joints, use of target muscles after min to mod verbal, visual, tactile cues.           PT Education - 11/27/20 2016     Education Details exercise technique, body mechanics    Person(s) Educated Patient    Methods Explanation;Demonstration;Verbal cues    Comprehension Verbalized understanding;Returned demonstration;Need further instruction               PT Short Term Goals - 10/28/20 2321       PT SHORT TERM GOAL #1   Title Pt will be independent with HEP in order to improve strength and balance in order to decrease fall risk and improve function at home and work.    Baseline 10/28/2020- Patient has no formal HEP in place    Time 6    Period Weeks    Status New    Target Date 12/09/20               PT Long Term Goals - 10/28/20 2328       PT LONG TERM GOAL #1   Title Pt will improve FOTO to target score of 75 to  display perceived improvements in ability to complete ADL's.    Baseline 10/28/2020= 71    Time 12    Period Weeks    Status New  Target Date 01/20/21      PT LONG TERM GOAL #2   Title Pt will decrease 5TSTS by at least 3 seconds in order to demonstrate clinically significant improvement in LE strength    Baseline 10/28/2020= 14.61 sec without UE support    Time 12    Period Weeks    Status New    Target Date 01/20/21      PT LONG TERM GOAL #3   Title Patient will increase Functional Gait Assessment score to >26/30 as to reduce fall risk and improve dynamic gait safety with community ambulation.    Baseline 10/28/2020= 23/30    Time 12    Period Weeks    Status New    Target Date 01/20/21                   Plan - 11/27/20 2033     Clinical Impression Statement Pt continues to be highly motivated throughout session. She tolerated all interventions well without reports of pain. However, performance with therex still limited with reports of LE fatigue following 12 hours of activity at [facility] over the weekend. However, pt was able to add leg press to interventions for multiple repitions, rating exercise as a moderate challenge. Pt performing higher level balance interventions as well. She shows greatest decrease in postural stability with addition of head turns while on compliant surfaces. The pt will benefit from further skilled PT to improve endurance, strength and balance.    Personal  Factors and Comorbidities Comorbidity 1    Comorbidities CVA    Examination-Activity Limitations Caring for Others;Stairs    Examination-Participation Restrictions Community Activity;Occupation;Yard Work    Stability/Clinical Decision Making Stable/Uncomplicated    Rehab Potential Good    PT Frequency 2x / week    PT Duration 12 weeks    PT Treatment/Interventions ADLs/Self Care Home Management;Cryotherapy;Moist Heat;DME Instruction;Gait training;Stair training;Functional mobility training;Therapeutic activities;Therapeutic exercise;Balance training;Neuromuscular re-education;Patient/family education;Manual techniques;Passive range of motion;Dry needling    PT Next Visit Plan Heavy focus needed on isolated hip flexion and ankle DF strengthening (persistent deficit); LLE fine motor control training;    PT Home Exercise Plan 10/31/2020: Access Code: [email or web address]  (balance exercises); no updates    Consulted and Agree with Plan of Care Patient             Patient will benefit from skilled therapeutic intervention in order to improve the following deficits and impairments:  Abnormal gait, Decreased activity tolerance, Decreased balance, Decreased coordination, Decreased endurance, Cardiopulmonary status limiting activity, Decreased mobility, Decreased strength, Difficulty walking, Impaired UE functional use  Visit Diagnosis: Muscle weakness (generalized)  Unsteadiness on feet     Problem List Patient Active Problem List   Diagnosis Date Noted   Anemia    Hemiparesis affecting left side as late effect of stroke (HCC)    Leukocytosis    Dyslipidemia    Spastic hemiparesis (HCC)    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   Long term current use of hormonal contraceptive 10/12/2020   Recurrent strokes (HCC) 10/11/2020   Middle cerebral artery embolism, 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   Nicotine vapor product user 10/06/2019   Migraines  05/09/2019    Smoker 1-2 Black & Milds/day 12/26/2018   LGSIL on Pap smear of cervix 08/18/2018   Encounter for surveillance of injectable contraceptive 08/18/2018   Generalized anxiety disorder with panic attacks 07/26/2017   ADD (attention deficit disorder) 07/07/2017   Obesity (BMI 30-39.9) 07/22/2015  Baird Kay, PT 11/27/2020, 8:38 PM  [facility] MAIN [facility] SERVICES [address] [place], [place], [address] Phone: [phone]   Fax:  [phone]  Name: AHMONI EDGE MRN: [id number] Date of Birth: Nov 18, 1995

## 2020-11-29 ENCOUNTER — Ambulatory Visit: Payer: Managed Care, Other (non HMO) | Admitting: Cardiology

## 2020-11-29 ENCOUNTER — Other Ambulatory Visit: Payer: Self-pay

## 2020-11-29 ENCOUNTER — Encounter: Payer: Self-pay | Admitting: Cardiology

## 2020-11-29 VITALS — BP 109/63 | HR 70 | Temp 98.7°F | Resp 16 | Ht 69.0 in | Wt 200.2 lb

## 2020-11-29 DIAGNOSIS — Q2112 Patent foramen ovale: Secondary | ICD-10-CM

## 2020-11-29 DIAGNOSIS — I688 Other cerebrovascular disorders in diseases classified elsewhere: Secondary | ICD-10-CM

## 2020-11-29 DIAGNOSIS — I6601 Occlusion and stenosis of right middle cerebral artery: Secondary | ICD-10-CM

## 2020-11-29 NOTE — H&P (View-Only) (Signed)
Primary Physician/Referring:  Gerre Scull, NP  Patient ID: Kristen Grant, female    DOB: Feb 03, 1996, 25 y.o.   MRN: [id number]  Chief Complaint  Patient presents with   Stroke   Hospitalization Follow-up   New Patient (Initial Visit)   HPI:    Kristen Grant  is a 25 y.o. Caucasian female patient with no significant prior cardiovascular history, patient was admitted to the hospital on 10/11/2020 with acute stroke, she had right distal M1 segment near complete occlusion for which she mechanical thrombectomy, but due to recurrence of strokelike symptoms, underwent repeat percutaneous thrombectomy on 10/21/2020.  She was also found to have a moderate to large sized PFO by transcranial bubble study and a small PFO by TEE and now referred to me for consideration of PFO closure.  She is recuperating very well, states that she is still has weakness on the left side but speech is improved, facial droop is improved.  Left arm still is weak.  No chest pain or shortness of breath.  She is accompanied by her mother.  She is a non-smoker.  Past Medical History:  Diagnosis Date   Anxiety    Asthma    no treatments required at this time   Migraine headache    Past Surgical History:  Procedure Laterality Date   BUBBLE STUDY  10/17/2020   Procedure: BUBBLE STUDY;  Surgeon: Jake Bathe, MD;  [facility]: [facility] ENDOSCOPY;  Service: Cardiovascular;;   IR ANGIO INTRA EXTRACRAN SEL COM CAROTID INNOMINATE UNI L MOD SED  10/11/2020   IR ANGIO VERTEBRAL SEL VERTEBRAL UNI L MOD SED  10/11/2020   IR CT HEAD LTD  10/11/2020   IR CT HEAD LTD  10/12/2020   IR PERCUTANEOUS ART THROMBECTOMY/INFUSION INTRACRANIAL INC DIAG ANGIO  10/11/2020   IR PERCUTANEOUS ART THROMBECTOMY/INFUSION INTRACRANIAL INC DIAG ANGIO  10/12/2020   NO PAST SURGERIES     RADIOLOGY WITH ANESTHESIA N/A 10/12/2020   Procedure: RADIOLOGY WITH ANESTHESIA;  Surgeon: Radiologist, Medication, MD;  Location: [facility] OR;  Service: Radiology;  Laterality: N/A;    RADIOLOGY WITH ANESTHESIA N/A 10/11/2020   Procedure: IR WITH ANESTHESIA;  Surgeon: Radiologist, Medication, MD;  Location: [facility] OR;  Service: Radiology;  Laterality: N/A;   TEE WITHOUT CARDIOVERSION N/A 10/17/2020   Procedure: TRANSESOPHAGEAL ECHOCARDIOGRAM (TEE);  Surgeon: Jake Bathe, MD;  Location: [facility] ENDOSCOPY;  Service: Cardiovascular;  Laterality: N/A;   Family History  Problem Relation Age of Onset   Hypertension Mother    Epilepsy Mother    Anxiety disorder Mother    Depression Mother    Migraines Mother    Diabetes Maternal Grandmother    Stroke Maternal Grandmother    Hypertension Maternal Grandmother    Asthma Half-Brother    Asthma Half-Sister    Breast cancer Neg Hx     Social History   Tobacco Use   Smoking status: Former    Types: E-cigarettes    Start date: 2021   Smokeless tobacco: Never  Substance Use Topics   Alcohol use: Not Currently    Comment: 1-2 drinks per month   Marital Status: Single  ROS  Review of Systems  Cardiovascular:  Negative for chest pain, dyspnea on exertion and leg swelling.  Gastrointestinal:  Negative for melena.  Neurological:  Positive for focal weakness (left arm).  Objective  Blood pressure 109/63, pulse 70, temperature 98.7 F (37.1 C), temperature source Temporal, resp. rate 16, height 5\' 9"  (1.753 m), weight 200 lb 3.2  oz (90.8 kg), SpO2 97 %. Body mass index is 29.56 kg/m.  Vitals with BMI 11/29/2020 11/21/2020 11/14/2020  Height 5\' 9"  - -  Weight 200 lbs 3 oz - -  BMI 29.55 - -  Systolic 109 117 96  Diastolic 63 78 59  Pulse 70 71 68  Some encounter information is confidential and restricted. Go to Review Flowsheets activity to see all data.     Physical Exam Neck:     Vascular: No carotid bruit or JVD.  Cardiovascular:     Rate and Rhythm: Normal rate and regular rhythm.     Pulses: Intact distal pulses.     Heart sounds: Normal heart sounds. No murmur heard.   No gallop.  Pulmonary:     Effort: Pulmonary  effort is normal.     Breath sounds: Normal breath sounds.  Abdominal:     General: Bowel sounds are normal.     Palpations: Abdomen is soft.  Musculoskeletal:        General: No swelling.  Neurological:     Motor: Weakness (left arm) present.     Laboratory examination:   Recent Labs    10/13/20 0308 10/18/20 0517 11/13/20 2137  NA 140 138 133*  K 3.6 3.5 3.0*  CL 112* 103 104  CO2 21* 19* 19*  GLUCOSE 139* 106* 102*  BUN 6 15 10   CREATININE 0.45 0.59 1.20*  CALCIUM 8.4* 9.6 9.1  GFRNONAA >60 >60 >60   estimated creatinine clearance is 86 mL/min (A) (by C-G formula based on SCr of 1.2 mg/dL (H)).  CMP Latest Ref Rng & Units 11/13/2020 10/18/2020 10/13/2020  Glucose 70 - 99 mg/dL 10/20/2020) 12/13/2020) 354(S)  BUN 6 - 20 mg/dL 10 15 6   Creatinine 0.44 - 1.00 mg/dL 568(L) 275(T  Sodium 135 - 145 mmol/L 133(L) 138 140  Potassium 3.5 - 5.1 mmol/L 3.0(L) 3.5 3.6  Chloride 98 - 111 mmol/L 104 103 112(H)  CO2 22 - 32 mmol/L 19(L) 19(L) 21(L)  Calcium 8.9 - 10.3 mg/dL 9.1 9.6 7.00(F)  Total Protein 6.5 - 8.1 g/dL 7.0 6.7 5.7(L)  Total Bilirubin 0.3 - 1.2 mg/dL 2.2(H) 1.9(H) 1.3(H)  Alkaline Phos 38 - 126 U/L 70 55 37(L)  AST 15 - 41 U/L 33 21 14(L)  ALT 0 - 44 U/L 21 20 14    CBC Latest Ref Rng & Units 11/13/2020 10/21/2020 10/18/2020  WBC 4.0 - 10.5 K/uL 8.5 8.4 12.5(H)  Hemoglobin 12.0 - 15.0 g/dL 11.6(L) 12.7  Hematocrit 36.0 - 46.0 % 40.8 34.9(L) 37.3  Platelets 150 - 400 K/uL 248 328 314    Lipid Panel Recent Labs    10/12/20 0018  CHOL 171  TRIG 55  LDLCALC 117*  VLDL 11  HDL 43  CHOLHDL 4.0   Lipid Panel     Component Value Date/Time   CHOL 171 10/12/2020 0018   TRIG 55 10/12/2020 0018   HDL 43 10/12/2020 0018   CHOLHDL 4.0 10/12/2020 0018   VLDL 11 10/12/2020 0018   LDLCALC 117 (H) 10/12/2020 0018     HEMOGLOBIN A1C Lab Results  Component Value Date   HGBA1C 5.0 10/12/2020   MPG 96.8 10/12/2020   TSH No results for input(s): TSH in the last  8760 hours.  Medications and allergies  No Known Allergies   Medication prior to this encounter:   Outpatient Medications Prior to Visit  Medication Sig Dispense Refill   acetaminophen (TYLENOL) 325 MG tablet Take 2 tablets (650  mg total) by mouth every 4 (four) hours as needed for mild pain (or temp > 37.5 C (99.5 F)).     aspirin 81 MG EC tablet Take 1 tablet (81 mg total) by mouth daily. Swallow whole. 100 tablet 0   atorvastatin (LIPITOR) 80 MG tablet Take 1 tablet (80 mg total) by mouth daily. 30 tablet 0   Baclofen 5 MG TABS Take one tablet (5 mg) by mouth at bedtime. 30 tablet 0   dicyclomine (BENTYL) 20 MG tablet Take 1 tablet (20 mg total) by mouth 2 (two) times daily. 20 tablet 0   topiramate (TOPAMAX) 25 MG tablet Take 1 tablet (25 mg total) by mouth 2 (two) times daily. 60 tablet 1   ondansetron (ZOFRAN ODT) 4 MG disintegrating tablet Take 1 tablet (4 mg total) by mouth every 8 (eight) hours as needed for nausea or vomiting. 20 tablet 0   No facility-administered medications prior to visit.     Medication list after today's encounter   Current Outpatient Medications  Medication Instructions   acetaminophen (TYLENOL) 650 mg, Oral, Every 4 hours PRN   Aspirin Low Dose 81 mg, Oral, Daily, Swallow whole.   atorvastatin (LIPITOR) 80 mg, Oral, Daily   Baclofen 5 MG TABS Take one tablet (5 mg) by mouth at bedtime.   dicyclomine (BENTYL) 20 mg, Oral, 2 times daily   topiramate (TOPAMAX) 25 mg, Oral, 2 times daily    Radiology:   No results found.  Cardiac Studies:   TEE 10/17/2020:  1. There are very few bubbles seen in cross over upon second bubble study performed. A small PFO may be present. There is also small color flow Doppler signal seen at PFO sight.  2. Left ventricular ejection fraction, by estimation, is 60 to 65%. The left ventricle has normal function.  3. Right ventricular systolic function is normal. The right ventricular size is normal.  4. No left  atrial/left atrial appendage thrombus was detected.  5. The mitral valve is normal in structure. No evidence of mitral valve regurgitation.  6. The aortic valve is normal in structure. Aortic valve regurgitation is not visualized.  7. Minimal evidence of shunting.  Lower extremity venous duplex 10/14/2020: No evidence of DVT bilaterally.  Transcranial Doppler with bubble study 10/14/2020: HITS heard at rest: spencer 3  HITS heard during valsalva: Partial curtain  Positive TCD Bubble study indicative of right to left medium size shunt at  rest and with valsalva   EKG:   EKG 11/29/2020: Normal sinus rhythm at rate of 63 bpm, normal axis.  No evidence of ischemia, normal EKG.    Assessment     ICD-10-CM   1. Middle cerebral artery embolism, right  I66.01 EKG 12-Lead    CBC    Basic metabolic panel    2. PFO (patent foramen ovale)  Q21.12     3. 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  Orders Placed This Encounter  Procedures   CBC   Basic metabolic panel   EKG 12-Lead   Recommendations:   Annajulia L Kromer is a 25 y.o. Caucasian female patient with no significant prior cardiovascular history, patient was admitted to the hospital on 10/11/2020 with acute stroke, she had right distal  M1 segment near complete occlusion for which she mechanical thrombectomy, but due to recurrence of strokelike symptoms, underwent repeat percutaneous thrombectomy on 10/21/2020.  She was also found to have a moderate to large sized PFO by transcranial bubble study and a small PFO by TEE and now referred to me for consideration of PFO closure.  I have reviewed the results of the TEE personally, images reviewed and I do believe that although only few bubbles were seen crossing  the PFO, transcranial bubble study is clearly indicated of any or curtain effect suggesting that the PFO is at least medium size if not large.  I discussed with the patient and her mother at the bedside regarding proceeding with repair of the ASD, discussions regarding less than 1% risk of recurrence of stroke during the procedure, death, need for surgical intervention in case of embolic complications, groin hematoma and bleeding complications but not limited to this.  Patient understands this and is willing to proceed.  She would like to go back to work and she would like to resume driving, I do not see any contraindication for the same.  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  I also advised her to lose weight.  Labs ordered, procedure will be set up, I will see her back 6 weeks after the procedure.  This was a 60-minute office visit encounter in review of complex procedural issues, review of medical records and labs.    Yates Decamp, MD, [facility] 11/29/2020, 10:42 AM Office: [phone]

## 2020-11-29 NOTE — Progress Notes (Signed)
Primary Physician/Referring:  Gerre Scull, NP  Patient ID: Kristen Grant, female    DOB: Feb 03, 1996, 25 y.o.   MRN: [id number]  Chief Complaint  Patient presents with   Stroke   Hospitalization Follow-up   New Patient (Initial Visit)   HPI:    Kristen Grant  is a 25 y.o. Caucasian female patient with no significant prior cardiovascular history, patient was admitted to the hospital on 10/11/2020 with acute stroke, she had right distal M1 segment near complete occlusion for which she mechanical thrombectomy, but due to recurrence of strokelike symptoms, underwent repeat percutaneous thrombectomy on 10/21/2020.  She was also found to have a moderate to large sized PFO by transcranial bubble study and a small PFO by TEE and now referred to me for consideration of PFO closure.  She is recuperating very well, states that she is still has weakness on the left side but speech is improved, facial droop is improved.  Left arm still is weak.  No chest pain or shortness of breath.  She is accompanied by her mother.  She is a non-smoker.  Past Medical History:  Diagnosis Date   Anxiety    Asthma    no treatments required at this time   Migraine headache    Past Surgical History:  Procedure Laterality Date   BUBBLE STUDY  10/17/2020   Procedure: BUBBLE STUDY;  Surgeon: Jake Bathe, MD;  Location: [facility];  Service: Cardiovascular;;   IR ANGIO INTRA EXTRACRAN SEL COM CAROTID INNOMINATE UNI L MOD SED  10/11/2020   IR ANGIO VERTEBRAL SEL VERTEBRAL UNI L MOD SED  10/11/2020   IR CT HEAD LTD  10/11/2020   IR CT HEAD LTD  10/12/2020   IR PERCUTANEOUS ART THROMBECTOMY/INFUSION INTRACRANIAL INC DIAG ANGIO  10/11/2020   IR PERCUTANEOUS ART THROMBECTOMY/INFUSION INTRACRANIAL INC DIAG ANGIO  10/12/2020   NO PAST SURGERIES     RADIOLOGY WITH ANESTHESIA N/A 10/12/2020   Procedure: RADIOLOGY WITH ANESTHESIA;  Surgeon: Radiologist, Medication, MD;  Location: [facility];  Service: Radiology;  Laterality: N/A;    RADIOLOGY WITH ANESTHESIA N/A 10/11/2020   Procedure: IR WITH ANESTHESIA;  Surgeon: Radiologist, Medication, MD;  Location: [facility];  Service: Radiology;  Laterality: N/A;   TEE WITHOUT CARDIOVERSION N/A 10/17/2020   Procedure: TRANSESOPHAGEAL ECHOCARDIOGRAM (TEE);  Surgeon: Jake Bathe, MD;  Location: [facility] ENDOSCOPY;  Service: Cardiovascular;  Laterality: N/A;   Family History  Problem Relation Age of Onset   Hypertension Mother    Epilepsy Mother    Anxiety disorder Mother    Depression Mother    Migraines Mother    Diabetes Maternal Grandmother    Stroke Maternal Grandmother    Hypertension Maternal Grandmother    Asthma Half-Brother    Asthma Half-Sister    Breast cancer Neg Hx     Social History   Tobacco Use   Smoking status: Former    Types: E-cigarettes    Start date: 2021   Smokeless tobacco: Never  Substance Use Topics   Alcohol use: Not Currently    Comment: 1-2 drinks per month   Marital Status: Single  ROS  Review of Systems  Cardiovascular:  Negative for chest pain, dyspnea on exertion and leg swelling.  Gastrointestinal:  Negative for melena.  Neurological:  Positive for focal weakness (left arm).  Objective  Blood pressure 109/63, pulse 70, temperature 98.7 F (37.1 C), temperature source Temporal, resp. rate 16, height 5\' 9"  (1.753 m), weight 200 lb 3.2  oz (90.8 kg), SpO2 97 %. Body mass index is 29.56 kg/m.  Vitals with BMI 11/29/2020 11/21/2020 11/14/2020  Height 5\' 9"  - -  Weight 200 lbs 3 oz - -  BMI 29.55 - -  Systolic 109 117 96  Diastolic 63 78 59  Pulse 70 71 68  Some encounter information is confidential and restricted. Go to Review Flowsheets activity to see all data.     Physical Exam Neck:     Vascular: No carotid bruit or JVD.  Cardiovascular:     Rate and Rhythm: Normal rate and regular rhythm.     Pulses: Intact distal pulses.     Heart sounds: Normal heart sounds. No murmur heard.   No gallop.  Pulmonary:     Effort: Pulmonary  effort is normal.     Breath sounds: Normal breath sounds.  Abdominal:     General: Bowel sounds are normal.     Palpations: Abdomen is soft.  Musculoskeletal:        General: No swelling.  Neurological:     Motor: Weakness (left arm) present.     Laboratory examination:   Recent Labs    10/13/20 0308 10/18/20 0517 11/13/20 2137  NA 140 138 133*  K 3.6 3.5 3.0*  CL 112* 103 104  CO2 21* 19* 19*  GLUCOSE 139* 106* 102*  BUN 6 15 10   CREATININE 0.45 0.59 1.20*  CALCIUM 8.4* 9.6 9.1  GFRNONAA >60 >60 >60   estimated creatinine clearance is 86 mL/min (A) (by C-G formula based on SCr of 1.2 mg/dL (H)).  CMP Latest Ref Rng & Units 11/13/2020 10/18/2020 10/13/2020  Glucose 70 - 99 mg/dL 10/20/2020) 12/13/2020) 354(S)  BUN 6 - 20 mg/dL 10 15 6   Creatinine 0.44 - 1.00 mg/dL 568(L) 275(T  Sodium 135 - 145 mmol/L 133(L) 138 140  Potassium 3.5 - 5.1 mmol/L 3.0(L) 3.5 3.6  Chloride 98 - 111 mmol/L 104 103 112(H)  CO2 22 - 32 mmol/L 19(L) 19(L) 21(L)  Calcium 8.9 - 10.3 mg/dL 9.1 9.6 7.00(F)  Total Protein 6.5 - 8.1 g/dL 7.0 6.7 5.7(L)  Total Bilirubin 0.3 - 1.2 mg/dL 2.2(H) 1.9(H) 1.3(H)  Alkaline Phos 38 - 126 U/L 70 55 37(L)  AST 15 - 41 U/L 33 21 14(L)  ALT 0 - 44 U/L 21 20 14    CBC Latest Ref Rng & Units 11/13/2020 10/21/2020 10/18/2020  WBC 4.0 - 10.5 K/uL 8.5 8.4 12.5(H)  Hemoglobin 12.0 - 15.0 g/dL 11.6(L) 12.7  Hematocrit 36.0 - 46.0 % 40.8 34.9(L) 37.3  Platelets 150 - 400 K/uL 248 328 314    Lipid Panel Recent Labs    10/12/20 0018  CHOL 171  TRIG 55  LDLCALC 117*  VLDL 11  HDL 43  CHOLHDL 4.0   Lipid Panel     Component Value Date/Time   CHOL 171 10/12/2020 0018   TRIG 55 10/12/2020 0018   HDL 43 10/12/2020 0018   CHOLHDL 4.0 10/12/2020 0018   VLDL 11 10/12/2020 0018   LDLCALC 117 (H) 10/12/2020 0018     HEMOGLOBIN A1C Lab Results  Component Value Date   HGBA1C 5.0 10/12/2020   MPG 96.8 10/12/2020   TSH No results for input(s): TSH in the last  8760 hours.  Medications and allergies  No Known Allergies   Medication prior to this encounter:   Outpatient Medications Prior to Visit  Medication Sig Dispense Refill   acetaminophen (TYLENOL) 325 MG tablet Take 2 tablets (650  mg total) by mouth every 4 (four) hours as needed for mild pain (or temp > 37.5 C (99.5 F)).     aspirin 81 MG EC tablet Take 1 tablet (81 mg total) by mouth daily. Swallow whole. 100 tablet 0   atorvastatin (LIPITOR) 80 MG tablet Take 1 tablet (80 mg total) by mouth daily. 30 tablet 0   Baclofen 5 MG TABS Take one tablet (5 mg) by mouth at bedtime. 30 tablet 0   dicyclomine (BENTYL) 20 MG tablet Take 1 tablet (20 mg total) by mouth 2 (two) times daily. 20 tablet 0   topiramate (TOPAMAX) 25 MG tablet Take 1 tablet (25 mg total) by mouth 2 (two) times daily. 60 tablet 1   ondansetron (ZOFRAN ODT) 4 MG disintegrating tablet Take 1 tablet (4 mg total) by mouth every 8 (eight) hours as needed for nausea or vomiting. 20 tablet 0   No facility-administered medications prior to visit.     Medication list after today's encounter   Current Outpatient Medications  Medication Instructions   acetaminophen (TYLENOL) 650 mg, Oral, Every 4 hours PRN   Aspirin Low Dose 81 mg, Oral, Daily, Swallow whole.   atorvastatin (LIPITOR) 80 mg, Oral, Daily   Baclofen 5 MG TABS Take one tablet (5 mg) by mouth at bedtime.   dicyclomine (BENTYL) 20 mg, Oral, 2 times daily   topiramate (TOPAMAX) 25 mg, Oral, 2 times daily    Radiology:   No results found.  Cardiac Studies:   TEE 10/17/2020:  1. There are very few bubbles seen in cross over upon second bubble study performed. A small PFO may be present. There is also small color flow Doppler signal seen at PFO sight.  2. Left ventricular ejection fraction, by estimation, is 60 to 65%. The left ventricle has normal function.  3. Right ventricular systolic function is normal. The right ventricular size is normal.  4. No left  atrial/left atrial appendage thrombus was detected.  5. The mitral valve is normal in structure. No evidence of mitral valve regurgitation.  6. The aortic valve is normal in structure. Aortic valve regurgitation is not visualized.  7. Minimal evidence of shunting.  Lower extremity venous duplex 10/14/2020: No evidence of DVT bilaterally.  Transcranial Doppler with bubble study 10/14/2020: HITS heard at rest: spencer 3  HITS heard during valsalva: Partial curtain  Positive TCD Bubble study indicative of right to left medium size shunt at  rest and with valsalva   EKG:   EKG 11/29/2020: Normal sinus rhythm at rate of 63 bpm, normal axis.  No evidence of ischemia, normal EKG.    Assessment     ICD-10-CM   1. Middle cerebral artery embolism, right  I66.01 EKG 12-Lead    CBC    Basic metabolic panel    2. PFO (patent foramen ovale)  Q21.12     3. 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  Orders Placed This Encounter  Procedures   CBC   Basic metabolic panel   EKG 12-Lead   Recommendations:   Kristen Grant is a 25 y.o. Caucasian female patient with no significant prior cardiovascular history, patient was admitted to the hospital on 10/11/2020 with acute stroke, she had right distal   M1 segment near complete occlusion for which she mechanical thrombectomy, but due to recurrence of strokelike symptoms, underwent repeat percutaneous thrombectomy on 10/21/2020.  She was also found to have a moderate to large sized PFO by transcranial bubble study and a small PFO by TEE and now referred to me for consideration of PFO closure.  I have reviewed the results of the TEE personally, images reviewed and I do believe that although only few bubbles were seen crossing  the PFO, transcranial bubble study is clearly indicated of any or curtain effect suggesting that the PFO is at least medium size if not large.  I discussed with the patient and her mother at the bedside regarding proceeding with repair of the ASD, discussions regarding less than 1% risk of recurrence of stroke during the procedure, death, need for surgical intervention in case of embolic complications, groin hematoma and bleeding complications but not limited to this.  Patient understands this and is willing to proceed.  She would like to go back to work and she would like to resume driving, I do not see any contraindication for the same.  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  I also advised her to lose weight.  Labs ordered, procedure will be set up, I will see her back 6 weeks after the procedure.  This was a 60-minute office visit encounter in review of complex procedural issues, review of medical records and labs.    Yates Decamp, MD, [facility] 11/29/2020, 10:42 AM Office: [phone]

## 2020-12-03 ENCOUNTER — Encounter: Payer: Self-pay | Admitting: Adult Health

## 2020-12-03 ENCOUNTER — Ambulatory Visit: Payer: Managed Care, Other (non HMO) | Admitting: Occupational Therapy

## 2020-12-03 ENCOUNTER — Ambulatory Visit: Payer: Managed Care, Other (non HMO)

## 2020-12-03 ENCOUNTER — Ambulatory Visit (INDEPENDENT_AMBULATORY_CARE_PROVIDER_SITE_OTHER): Payer: Managed Care, Other (non HMO) | Admitting: Adult Health

## 2020-12-03 VITALS — BP 110/75 | HR 74 | Ht 69.0 in | Wt 201.0 lb

## 2020-12-03 DIAGNOSIS — G43809 Other migraine, not intractable, without status migrainosus: Secondary | ICD-10-CM | POA: Diagnosis not present

## 2020-12-03 DIAGNOSIS — E785 Hyperlipidemia, unspecified: Secondary | ICD-10-CM | POA: Diagnosis not present

## 2020-12-03 DIAGNOSIS — Q2112 Patent foramen ovale: Secondary | ICD-10-CM | POA: Diagnosis not present

## 2020-12-03 DIAGNOSIS — I63511 Cerebral infarction due to unspecified occlusion or stenosis of right middle cerebral artery: Secondary | ICD-10-CM | POA: Diagnosis not present

## 2020-12-03 MED ORDER — TOPIRAMATE 25 MG PO TABS: 25.0000 mg | ORAL_TABLET | Freq: Two times a day (BID) | ORAL | 3 refills | Status: DC

## 2020-12-03 MED ORDER — ATORVASTATIN CALCIUM 80 MG PO TABS: 80.0000 mg | ORAL_TABLET | Freq: Every day | ORAL | 3 refills | Status: DC

## 2020-12-03 NOTE — Progress Notes (Signed)
[facility] [address] [place]. [place] [address] [phone]       HOSPITAL FOLLOW UP NOTE  Ms. Kristen Grant Date of Birth:  1995-09-12 Medical Record Number:  [id number]   Reason for Referral:  hospital stroke follow up    SUBJECTIVE:   CHIEF COMPLAINT:  Chief Complaint  Patient presents with   Follow-up    Rm  2 alone Pt is well and sable, no complaints     HPI:   Kristen Grant is a 25 y.o. female with PMHx of anxiety, asthma, migraine headaches and on birth control pills who presented on 10/11/2020 with left-sided weakness.  On arrival to ED, slight right gaze deviation, left facial droop, dysarthria and inability to move left side as well as c/o right-sided headache.  Personally reviewed hospitalization pertinent progress notes, lab work and imaging.  Evaluated by Dr. Leonie Man.  Stroke work-up revealed right MCA infarct in setting of right M1 occlusion s/p IV TNKase and mechanical thrombectomy with TICI 3 revascularization require repeat thrombectomy 24 hours later for reocclusion initiate pain TICI 2B-2C revascularization, infarct embolic secondary to cryptogenic etiology. MRI brain showed right MCA territory infarcts predominantly involving right caudate, putamen, anterior limb of R IC, insula, operculum and overlying right frontal lobe as well as a few punctate acute infarcts in the right parietal lobe as well as associated cytotoxic edema with slight (2-1mm) leftward midline shift at the foramen of Monro and probable small amount of hemorrhage in the posterior right frontal lobe/insular region.  EF 60 to 65%.  LDL 117.  A1c 5.0.  Hypercoagulable labs negative.  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 LE Dopplers negative for DVT. TEE noted small PFO.  Placed on aspirin 81 mg daily and atorvastatin 80 mg daily.  UDS positive for THC.  Recommended discontinuing birth control pill and to follow-up with OB/GYN for alternative long-term  contraceptive methods.  No prior stroke history.  Therapy eval's recommended CIR for residual left hemiparesis and dysarthria.   Today, 12/03/2020, [name] is being seen for initial hospital stroke follow-up unaccompanied. She has been making excellent recovery.  Continues to work with PT/OT at [facility] 2x weekly. Reports continued mild left arm weakness but more so difficulty with fine motor skills. Also reports decreased activity tolerance and complaints of fatigue - notices LLE dragging at that time. Also c/o right lower back pain. Reports speaking with therapy re: AFO brace but has not heard anything else.  Currently on short-term disability which ends 11/6.  She is looking forward to returning back to work at [employer] but she is unsure if she will be abel to return 11/6 as she is scheduled for PFO closure on 11/8. She has since returned back to driving without difficulty. Able to maintain majority of ADLs and IADLs independently. Headaches well controlled on topiramate 25 mg twice daily but ran out of rx 3-4 days ago and has noticed slight increase in headaches.   Denies new stroke/TIA symptoms.  Reports compliance on aspirin 81 mg daily but recently ran out of atorvastatin. Denies side effects.  Blood pressure today 110/75. Eval by Dr. Einar Gip on 11/29/2020 for PFO closure - she is scheduled for closure on 12/10/2020. Appt scheduled 11/14 with Vance Peper, NP to establish care as PCP.  No further concerns at this time      PERTINENT IMAGING  Code Stroke  CT head hypodensity at right anterior temporal lobe.   ASPECTS 10.  CTA head & neck:   1. Thrombus in the distal right M1 segment with near complete occlusion. There is reconstitution of flow in superior M2 division but no inferior M2 division is identified. 2. 11 cc infarct core in the right corona radiata, 75 cc penumbra. CT perfusion : CBF (<30%) Volume: 31mL Perfusion (Tmax>6.0s) volume: 91mL Mismatch Volume: 82mL Infarction  Location:Right corona radiata Cerebral angio : report from Dr. Estanislado Pandy  bilateral common carotid and Lt VA angiograms followed by complete revascularization of occluded LT MCA M1 seg with x pass with contact aspiration and x1 pass with solitaire 51mm x 40 mm retriever and contact aspiration achieving a TICI 3 revascularization. Post CT brain No ICH . 5.MRI BRAIN:   Acute anterior right MCA territory infarct, predominantly involving the right caudate, putamen, anterior limb of the right internal capsule, insula, operculum and overlying right frontal lobe. A few punctate acute infarcts in the right parietal lobe.  Associated cytotoxic edema with slight (2-3 mm) leftward midline shift at the foramen of Monro. Probable small amount of hemorrhage in the posterior right frontal lobe/insular region. 6. 2D Echo LVEF 60-65%, normal LA, poorly visualized interatrial septum  7. LDL 117 8.HgbA1c 5.0    ROS:   14 system review of systems performed and negative with exception of those listed in HPI  PMH:  Past Medical History:  Diagnosis Date   Anxiety    Asthma    no treatments required at this time   Migraine headache     PSH:  Past Surgical History:  Procedure Laterality Date   BUBBLE STUDY  10/17/2020   Procedure: BUBBLE STUDY;  Surgeon: Jerline Pain, MD;  Location: [facility];  Service: Cardiovascular;;   IR ANGIO INTRA EXTRACRAN SEL COM CAROTID INNOMINATE UNI L MOD SED  10/11/2020   IR ANGIO VERTEBRAL SEL VERTEBRAL UNI L MOD SED  10/11/2020   IR CT HEAD LTD  10/11/2020   IR CT HEAD LTD  10/12/2020   IR PERCUTANEOUS ART THROMBECTOMY/INFUSION INTRACRANIAL INC DIAG ANGIO  10/11/2020   IR PERCUTANEOUS ART THROMBECTOMY/INFUSION INTRACRANIAL INC DIAG ANGIO  10/12/2020   NO PAST SURGERIES     RADIOLOGY WITH ANESTHESIA N/A 10/12/2020   Procedure: RADIOLOGY WITH ANESTHESIA;  Surgeon: Radiologist, Medication, MD;  Location: [place];  Service: Radiology;  Laterality: N/A;   RADIOLOGY WITH ANESTHESIA N/A  10/11/2020   Procedure: IR WITH ANESTHESIA;  Surgeon: Radiologist, Medication, MD;  Location: [facility];  Service: Radiology;  Laterality: N/A;   TEE WITHOUT CARDIOVERSION N/A 10/17/2020   Procedure: TRANSESOPHAGEAL ECHOCARDIOGRAM (TEE);  Surgeon: Jerline Pain, MD;  Location: [facility] ENDOSCOPY;  Service: Cardiovascular;  Laterality: N/A;    Social History:  Social History   Socioeconomic History   Marital status: Single    Spouse name: Not on file   Number of children: 1   Years of education: Not on file   Highest education level: GED or equivalent  Occupational History   Not on file  Tobacco Use   Smoking status: Former    Types: E-cigarettes    Start date: 2021   Smokeless tobacco: Never  Vaping Use   Vaping Use: Former   Substances: Nicotine, THC, CBD  Substance and Sexual Activity   Alcohol use: Not Currently    Comment: 1-2 drinks per month   Drug use: Not Currently    Frequency: 3.0 times per week    Types: Marijuana   Sexual activity: Yes    Partners: Male    Birth  control/protection: Injection  Other Topics Concern   Not on file  Social History Narrative   Not on file   Social Determinants of Health   Financial Resource Strain: Not on file  Food Insecurity: Not on file  Transportation Needs: Not on file  Physical Activity: Not on file  Stress: Not on file  Social Connections: Not on file  Intimate Partner Violence: Not on file    Family History:  Family History  Problem Relation Age of Onset   Hypertension Mother    Epilepsy Mother    Anxiety disorder Mother    Depression Mother    Migraines Mother    Diabetes Maternal Grandmother    Stroke Maternal Grandmother    Hypertension Maternal Grandmother    Asthma Half-Brother    Asthma Half-Sister    Breast cancer Neg Hx     Medications:   Current Outpatient Medications on File Prior to Visit  Medication Sig Dispense Refill   aspirin 81 MG EC tablet Take 1 tablet (81 mg total) by mouth daily. Swallow  whole. 100 tablet 0   atorvastatin (LIPITOR) 80 MG tablet Take 1 tablet (80 mg total) by mouth daily. 30 tablet 0   topiramate (TOPAMAX) 25 MG tablet Take 1 tablet (25 mg total) by mouth 2 (two) times daily. 60 tablet 1   No current facility-administered medications on file prior to visit.    Allergies:  No Known Allergies    OBJECTIVE:  Physical Exam  Vitals:   12/03/20 0805  BP: 110/75  Pulse: 74  Weight: 201 lb (91.2 kg)  Height: 5\' 9"  (1.753 m)   Body mass index is 29.68 kg/m. No results found.  General: well developed, well nourished, very pleasant young Caucasian female, seated, in no evident distress Head: head normocephalic and atraumatic.   Neck: supple with no carotid or supraclavicular bruits Cardiovascular: regular rate and rhythm, no murmurs Musculoskeletal: no deformity Skin:  no rash/petichiae Vascular:  Normal pulses all extremities   Neurologic Exam Mental Status: Awake and fully alert.  Fluent speech and language.  Oriented to place and time. Recent and remote memory intact. Attention span, concentration and fund of knowledge appropriate. Mood and affect appropriate.  Cranial Nerves: Fundoscopic exam reveals sharp disc margins. Pupils equal, briskly reactive to light. Extraocular movements full without nystagmus. Visual fields full to confrontation. Hearing intact. Facial sensation intact. Left nasolabial fold flattening.  Tongue and palate moves normally and symmetrically.  Motor: Normal bulk and tone. Normal strength in all tested extremity muscles except slight decreased left hand dexterity and mild ADF weakness Sensory.: intact to touch , pinprick , position and vibratory sensation.  Coordination: Rapid alternating movements normal in all extremities except slightly decreased left hand. Finger-to-nose and heel-to-shin performed accurately bilaterally.  On his right arm over left arm. Gait and Station: Arises from chair without difficulty. Stance is  normal. Gait demonstrates normal stride length and balance although left foot slapping without use of assistive device. Tandem walk and heel toe without great difficulty.  Reflexes: 1+ and symmetric. Toes downgoing.     NIHSS  1 Modified Rankin  2      ASSESSMENT: Kristen Grant is a 25 y.o. year old female with recent right MCA infarcts in setting of right M1 occlusion s/p IV TNKase and IR with TICI 3 revascularization on 10/11/2020 and repeat thrombectomy 24 hours later for reocclusion of dominant superior division of the right MCA achieving TICI 2B-2C revascularization on 10/12/2020, infarct embolic of cryptogenic etiology.  Vascular risk factors include evidence of PFO on TCD and TEE, migraine headaches, HLD, birth control use and THC use.      PLAN:  R MCA infarcts :  Residual deficit: decreased left hand fine motor skills and mild left ADF weakness. Continue working with PT/OT at [facility]. Currently on STD provided by PMR.  Continue aspirin 81 mg daily  and restart atorvastatin 80 mg daily for secondary stroke prevention. Refill provided but request on going refills by PCP once established Discussed secondary stroke prevention measures and importance of close PCP follow up for aggressive stroke risk factor management. I have gone over the pathophysiology of stroke, warning signs and symptoms, risk factors and their management in some detail with instructions to go to the closest emergency room for symptoms of concern. PFO: ROPE score 10 (no hx of HTN, DM, prior stroke, non smoker, cortical infarct on imaging and age) indicating 88% chance stroke in setting of PFO.  Scheduled to undergo PFO closure on 11/8 by Dr. Jacinto Halim HLD: LDL goal <70. Recent LDL 117.  Continue atorvastatin 80 mg daily. Repeat lipid panel today Migraine headaches: Currently well controlled on topiramate 25 mg twice daily - refill provided OCP: discussed importance of refraining from estrogen birth control use. Advised to f/u  with OBGYN to discuss alternate options    Follow up in 4-5 months or call earlier if needed   CC:  [facility] provider: Dr. Pearlean Brownie PCP: Gerre Scull, NP    I spent 56 minutes of face-to-face and non-face-to-face time with patient.  This included previsit chart review including review of recent hospitalization, lab review, study review, order entry, electronic health record documentation, patient education regarding recent stroke including etiology, PFO closure, secondary stroke prevention measures and importance of managing stroke risk factors, residual deficits and typical recovery time and answered all other questions to patient satisfaction  Ihor Austin, AGNP-BC  [facility] [address] [place], [place] [address]  Phone [phone] Fax [phone] Note: This document was prepared with digital dictation and possible smart phrase technology. Any transcriptional errors that result from this process are unintentional.

## 2020-12-03 NOTE — Patient Instructions (Addendum)
Continue working with therapies for likely ongoing recovery  Restart topamax 25mg  twice daily for migraine prevention   Continue aspirin 81 mg daily  and atorvastatin 80 mg daily for secondary stroke prevention  PFO closure 11/8 - ensure routine follow up with Dr. 13/8 as advised   Continue to follow up with PCP regarding cholesterol management  Maintain strict control of cholesterol with LDL cholesterol (bad cholesterol) goal below 70 mg/dL     Followup in the future with me in 4-5 months or call earlier if needed      Thank you for coming to see Jacinto Halim at [facility]. I hope we have been able to provide you high quality care today.  You may receive a patient satisfaction survey over the next few weeks. We would appreciate your feedback and comments so that we may continue to improve ourselves and the health of our patients.    Stroke Prevention Some medical conditions and lifestyle choices can lead to a higher risk for a stroke. You can help to prevent a stroke by eating healthy foods and exercising. It also helps to not smoke and to manage any health problems you may have. How can this condition affect me? A stroke is an emergency. It should be treated right away. A stroke can lead to brain damage or threaten your life. There is a better chance of surviving and getting better after a stroke if you get medical help right away. What can increase my risk? The following medical conditions may increase your risk of a stroke: Diseases of the heart and blood vessels (cardiovascular disease). High blood pressure (hypertension). Diabetes. High cholesterol. Sickle cell disease. Problems with blood clotting. Being very overweight. Sleeping problems (obstructivesleep apnea). Other risk factors include: Being older than age 63. A history of blood clots, stroke, or mini-stroke (TIA). Race, ethnic background, or a family history of stroke. Smoking or using tobacco  products. Taking birth control pills, especially if you smoke. Heavy alcohol and drug use. Not being active. What actions can I take to prevent this? Manage your health conditions High cholesterol. Eat a healthy diet. If this is not enough to manage your cholesterol, you may need to take medicines. Take medicines as told by your doctor. High blood pressure. Try to keep your blood pressure below 130/80. If your blood pressure cannot be managed through a healthy diet and regular exercise, you may need to take medicines. Take medicines as told by your doctor. Ask your doctor if you should check your blood pressure at home. Have your blood pressure checked every year. Diabetes. Eat a healthy diet and get regular exercise. If your blood sugar (glucose) cannot be managed through diet and exercise, you may need to take medicines. Take medicines as told by your doctor. Talk to your doctor about getting checked for sleeping problems. Signs of a problem can include: Snoring a lot. Feeling very tired. Make sure that you manage any other conditions you have. Nutrition  Follow instructions from your doctor about what to eat or drink. You may be told to: Eat and drink fewer calories each day. Limit how much salt (sodium) you use to 1,500 milligrams (mg) each day. Use only healthy fats for cooking, such as olive oil, canola oil, and sunflower oil. Eat healthy foods. To do this: Choose foods that are high in fiber. These include whole grains, and fresh fruits and vegetables. Eat at least 5 servings of fruits and vegetables a day. Try to fill one-half of  your plate with fruits and vegetables at each meal. Choose low-fat (lean) proteins. These include low-fat cuts of meat, chicken without skin, fish, tofu, beans, and nuts. Eat low-fat dairy products. Avoid foods that: Are high in salt. Have saturated fat. Have trans fat. Have cholesterol. Are processed or pre-made. Count how many carbohydrates  you eat and drink each day. Lifestyle If you drink alcohol: Limit how much you have to: 0-1 drink a day for women who are not pregnant. 0-2 drinks a day for men. Know how much alcohol is in your drink. In the [place] one drink equals one 12 oz bottle of beer ( ), one 5 oz glass of wine ( ), or one 1 oz glass of hard liquor (29mL). Do not smoke or use any products that have nicotine or tobacco. If you need help quitting, ask your doctor. Avoid secondhand smoke. Do not use drugs. Activity  Try to stay at a healthy weight. Get at least 30 minutes of exercise on most days, such as: Fast walking. Biking. Swimming. Medicines Take over-the-counter and prescription medicines only as told by your doctor. Avoid taking birth control pills. Talk to your doctor about the risks of taking birth control pills if: You are over 29 years old. You smoke. You get very bad headaches. You have had a blood clot. Where to find more information American Stroke Association: www.strokeassociation.org Get help right away if: You or a loved one has any signs of a stroke. "BE FAST" is an easy way to remember the warning signs: B - Balance. Dizziness, sudden trouble walking, or loss of balance. E - Eyes. Trouble seeing or a change in how you see. F - Face. Sudden weakness or loss of feeling of the face. The face or eyelid may droop on one side. A - Arms. Weakness or loss of feeling in an arm. This happens all of a sudden and most often on one side of the body. S - Speech. Sudden trouble speaking, slurred speech, or trouble understanding what people say. T - Time. Time to call emergency services. Write down what time symptoms started. You or a loved one has other signs of a stroke, such as: A sudden, very bad headache with no known cause. Feeling like you may vomit (nausea). Vomiting. A seizure. These symptoms may be an emergency. Get help right away. Call your local emergency services (911 in the  [place] Do not wait to see if the symptoms will go away. Do not drive yourself to the hospital. Summary You can help to prevent a stroke by eating healthy, exercising, and not smoking. It also helps to manage any health problems you have. Do not smoke or use any products that contain nicotine or tobacco. Get help right away if you or a loved one has any signs of a stroke. This information is not intended to replace advice given to you by your health care provider. Make sure you discuss any questions you have with your health care provider. Document Revised: 08/21/2019 Document Reviewed: 08/21/2019 Elsevier Patient Education  2022 [employer].

## 2020-12-05 ENCOUNTER — Ambulatory Visit: Payer: Managed Care, Other (non HMO) | Attending: Physician Assistant

## 2020-12-05 ENCOUNTER — Ambulatory Visit: Payer: Managed Care, Other (non HMO)

## 2020-12-05 ENCOUNTER — Other Ambulatory Visit: Payer: Self-pay

## 2020-12-05 DIAGNOSIS — R278 Other lack of coordination: Secondary | ICD-10-CM | POA: Insufficient documentation

## 2020-12-05 DIAGNOSIS — M6281 Muscle weakness (generalized): Secondary | ICD-10-CM | POA: Insufficient documentation

## 2020-12-05 DIAGNOSIS — R269 Unspecified abnormalities of gait and mobility: Secondary | ICD-10-CM | POA: Insufficient documentation

## 2020-12-05 DIAGNOSIS — R2681 Unsteadiness on feet: Secondary | ICD-10-CM | POA: Diagnosis present

## 2020-12-05 DIAGNOSIS — R2689 Other abnormalities of gait and mobility: Secondary | ICD-10-CM | POA: Diagnosis present

## 2020-12-05 DIAGNOSIS — R262 Difficulty in walking, not elsewhere classified: Secondary | ICD-10-CM | POA: Insufficient documentation

## 2020-12-05 LAB — LIPID PANEL
Chol/HDL Ratio: 3 ratio (ref 0.0–4.4)
Cholesterol, Total: 109 mg/dL (ref 100–199)
HDL: 36 mg/dL — ABNORMAL LOW (ref >39)
LDL Chol Calc (NIH): 53 mg/dL (ref 0–99)
Triglycerides: 110 mg/dL (ref 0–149)
VLDL Cholesterol Cal: 20 mg/dL (ref 5–40)

## 2020-12-05 NOTE — Therapy (Signed)
[place] MAIN [facility] SERVICES [address] [place], [place], [address] Phone: [phone]   Fax:  [phone]  Physical Therapy Treatment/Physical Therapy Progress Note   Dates of reporting period  10/28/2020  to   12/05/2020  Patient Details  Name: Kristen Grant MRN: [id number] Date of Birth: 12/28/1995 Referring Provider (PT): Lauraine Rinne   Encounter Date: 12/05/2020   PT End of Session - 12/05/20 1112     Visit Number 10    Number of Visits 25    Date for PT Re-Evaluation 01/20/21    Authorization Type [employer] Managed    Authorization Time Period 10/28/20-01/20/21; PN on 12/05/2020    Progress Note Due on Visit 10    PT Start Time 1107    PT Stop Time 1145    PT Time Calculation (min) 38 min    Equipment Utilized During Treatment Gait belt    Activity Tolerance Patient tolerated treatment well;No increased pain    Behavior During Therapy WFL for tasks assessed/performed             Past Medical History:  Diagnosis Date   Anxiety    Asthma    no treatments required at this time   Migraine headache     Past Surgical History:  Procedure Laterality Date   BUBBLE STUDY  10/17/2020   Procedure: BUBBLE STUDY;  Surgeon: Jerline Pain, MD;  Location: [place];  Service: Cardiovascular;;   IR ANGIO INTRA EXTRACRAN SEL COM CAROTID INNOMINATE UNI L MOD SED  10/11/2020   IR ANGIO VERTEBRAL SEL VERTEBRAL UNI L MOD SED  10/11/2020   IR CT HEAD LTD  10/11/2020   IR CT HEAD LTD  10/12/2020   IR PERCUTANEOUS ART THROMBECTOMY/INFUSION INTRACRANIAL INC DIAG ANGIO  10/11/2020   IR PERCUTANEOUS ART THROMBECTOMY/INFUSION INTRACRANIAL INC DIAG ANGIO  10/12/2020   NO PAST SURGERIES     RADIOLOGY WITH ANESTHESIA N/A 10/12/2020   Procedure: RADIOLOGY WITH ANESTHESIA;  Surgeon: Radiologist, Medication, MD;  Location: [place];  Service: Radiology;  Laterality: N/A;   RADIOLOGY WITH ANESTHESIA N/A 10/11/2020   Procedure: IR WITH ANESTHESIA;  Surgeon:  Radiologist, Medication, MD;  Location: [place];  Service: Radiology;  Laterality: N/A;   TEE WITHOUT CARDIOVERSION N/A 10/17/2020   Procedure: TRANSESOPHAGEAL ECHOCARDIOGRAM (TEE);  Surgeon: Jerline Pain, MD;  Location: [facility];  Service: Cardiovascular;  Laterality: N/A;    There were no vitals filed for this visit.   Subjective Assessment - 12/05/20 1341     Subjective Patient reports doing well and received a good check up from MDs this week. She states she is scheduled for PFO procedure next week and may possibly return to work in approx 2 weeks if able.    Patient is accompained by: Family member    Pertinent History Malayia L Laureano is a 25 year old right-handed female with morbid obesity BMI 29.92, anxiety, migraine headaches, tobacco use.  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 Presented 10/11/2020 with acute onset of left-sided weakness, dysarthria and facial droop.  Cranial CT scan showed hypodensity in the right anterior temporal lobe concerning for infarct.  Pertaining to patient's right MCA infarct predominantly right caudate putamen anterior limb right internal capsule secondary to right M1 occlusion status post revascularization mechanical thrombectomy requiring repeat thrombectomy 10/12/2020 for occluded dominant superior division of right MCA.    Limitations Walking;House hold activities    How long can you  sit comfortably? no limitations    How long can you stand comfortably? 30 min    How long can you walk comfortably? 20 min    Patient Stated Goals To improve my balance and endurance back to my previous level of working full time and taking care of 5 year son    Currently in Pain? No/denies    Pain Onset In the past 7 days            Interventions:   Assessment of STG and LTG  Home exercise program- Patient verbalized understanding of balance/walking HEP and states no problem or  difficulty performing at this time. GOAL MET  LTGs:    FGA= 30/30 (improved from 23/30)    5xSTS= 13.6- without UE (improved from 14.61 sec)  FOTO= 74 (improved from 71)  Therapeutic exercises:  Instructed patient in seated resistive ankle Dorsiflexion using blue TB  2 sets of 10 reps. (Instructed to build up to performing 3 sets of 10-12 reps 3 days/week)   Verbalized remaining focus will be on normalcy of gait including working on heel to toe sequencing and less audible functional foot drop  Patient able to walk in clinic with improved heel to toe gait sequencing after VC as (patient was ambulating in clinic and could hear an audible thud with left foot). She was able to normalize gait with increased concentration.   Clinical Impression: Patient performing well overall and has achieved established balance goal using FGA - able to achieve 30/30 today. She also demonstrated improved functional LE strength as seen by improved 5 x STS. She is also demo improved self perceived ability as reflected in FOTO score. Patient's condition has the potential to improve in response to therapy. Maximum improvement is yet to be obtained. The anticipated improvement is attainable and reasonable in a generally predictable time.                   PT Education - 12/05/20 1342     Education Details gait sequencing; Purpose of functional outcome measures    Person(s) Educated Patient    Methods Explanation;Demonstration;Tactile cues    Comprehension Verbalized understanding;Returned demonstration;Verbal cues required;Tactile cues required;Need further instruction              PT Short Term Goals - 12/05/20 1332       PT SHORT TERM GOAL #1   Title Pt will be independent with HEP in order to improve strength and balance in order to decrease fall risk and improve function at home and work.    Baseline 10/28/2020- Patient has no formal HEP in place; 12/05/2020=Patient verbalized  understanding of balance/walking HEP and states no problem or difficulty performing at this time    Time 6    Period Weeks    Status Achieved    Target Date 12/09/20               PT Long Term Goals - 12/05/20 1332       PT LONG TERM GOAL #1   Title Pt will improve FOTO to target score of 75 to  display perceived improvements in ability to complete ADL's.    Baseline 10/28/2020= 71; 12/05/2020= 74    Time 12    Period Weeks    Status On-going    Target Date 01/20/21      PT LONG TERM GOAL #2   Title Pt will decrease 5TSTS by at least 3 seconds in order to demonstrate clinically significant improvement in  LE strength    Baseline 10/28/2020= 14.61 sec without UE support; 12/05/2020= 13.60 sec without UE support    Time 12    Period Weeks    Status On-going      PT LONG TERM GOAL #3   Title Patient will increase Functional Gait Assessment score to >26/30 as to reduce fall risk and improve dynamic gait safety with community ambulation.    Baseline 10/28/2020= 23/30; 12/05/2020= 30/30    Time 12    Period Weeks    Status Achieved                   Plan - 12/05/20 1113     Clinical Impression Statement Patient performing well overall and has achieved established balance goal using FGA - able to achieve 30/30 today. She also demonstrated improved functional LE strength as seen by improved 5 x STS. She is also demo improved self perceived ability as reflected in FOTO score. Patient's condition has the potential to improve in response to therapy. Maximum improvement is yet to be obtained. The anticipated improvement is attainable and reasonable in a generally predictable time.    Personal Factors and Comorbidities Comorbidity 1    Comorbidities CVA    Examination-Activity Limitations Caring for Others;Stairs    Examination-Participation Restrictions Community Activity;Occupation;Yard Work    Stability/Clinical Decision Making Stable/Uncomplicated    Rehab Potential Good     PT Frequency 2x / week    PT Duration 12 weeks    PT Treatment/Interventions ADLs/Self Care Home Management;Cryotherapy;Moist Heat;DME Instruction;Gait training;Stair training;Functional mobility training;Therapeutic activities;Therapeutic exercise;Balance training;Neuromuscular re-education;Patient/family education;Manual techniques;Passive range of motion;Dry needling    PT Next Visit Plan Heavy focus needed on isolated hip flexion and ankle DF strengthening (persistent deficit); LLE fine motor control training;    PT Home Exercise Plan 10/31/2020: Access Code: [email or web address]  (balance exercises); no updates    Consulted and Agree with Plan of Care Patient             Patient will benefit from skilled therapeutic intervention in order to improve the following deficits and impairments:  Abnormal gait, Decreased activity tolerance, Decreased balance, Decreased coordination, Decreased endurance, Cardiopulmonary status limiting activity, Decreased mobility, Decreased strength, Difficulty walking, Impaired UE functional use  Visit Diagnosis: Abnormality of gait and mobility  Difficulty in walking, not elsewhere classified  Muscle weakness (generalized)  Other lack of coordination     Problem List Patient Active Problem List   Diagnosis Date Noted   Anemia    Hemiparesis affecting left side as late effect of stroke (HCC)    Leukocytosis    Dyslipidemia    Spastic hemiparesis (HCC)    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   Long term current use of hormonal contraceptive 10/12/2020   Recurrent strokes ([place]) 10/11/2020   Middle cerebral artery embolism, 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   Nicotine vapor product user 10/06/2019   Migraines  05/09/2019   Smoker 1-2 Black & Milds/day 12/26/2018   LGSIL on Pap smear of cervix 08/18/2018   Encounter for surveillance of injectable contraceptive 08/18/2018   Generalized anxiety disorder with panic attacks  07/26/2017   ADD (attention deficit disorder) 07/07/2017   Obesity (BMI 30-39.9) 07/22/2015    Lewis Moccasin, PT 12/05/2020, 1:44 PM  [place] MAIN [facility] SERVICES [address] [place], [place], [address] Phone: [phone]   Fax:  [phone]  Name: ALEXZIA KASLER MRN: [id number] Date of Birth: 09/17/1995

## 2020-12-06 NOTE — Therapy (Signed)
[facility] [facility] MAIN [facility] SERVICES [address] [place], [place], [address] Phone: [phone]   Fax:  [phone]  Occupational Therapy Treatment  Patient Details  Name: Kristen Grant MRN: [id number] Date of Birth: [birth year] Referring Provider (OT): Mariam Dollar, [place]   Encounter Date: 12/05/2020   OT End of Session - 12/06/20 0805     Visit Number 11    Number of Visits 24    Date for OT Re-Evaluation 01/19/21    Authorization Type Progress report period starting 11/27/2020    OT Start Time 1145    OT Stop Time 1230    OT Time Calculation (min) 45 min    Activity Tolerance Patient tolerated treatment well    Behavior During Therapy [facility] for tasks assessed/performed             Past Medical History:  Diagnosis Date   Anxiety    Asthma    no treatments required at this time   Migraine headache     Past Surgical History:  Procedure Laterality Date   BUBBLE STUDY  10/17/2020   Procedure: BUBBLE STUDY;  Surgeon: Jake Bathe, MD;  Location: [facility] ENDOSCOPY;  Service: Cardiovascular;;   IR ANGIO INTRA EXTRACRAN SEL COM CAROTID INNOMINATE UNI L MOD SED  10/11/2020   IR ANGIO VERTEBRAL SEL VERTEBRAL UNI L MOD SED  10/11/2020   IR CT HEAD LTD  10/11/2020   IR CT HEAD LTD  10/12/2020   IR PERCUTANEOUS ART THROMBECTOMY/INFUSION INTRACRANIAL INC DIAG ANGIO  10/11/2020   IR PERCUTANEOUS ART THROMBECTOMY/INFUSION INTRACRANIAL INC DIAG ANGIO  10/12/2020   NO PAST SURGERIES     RADIOLOGY WITH ANESTHESIA N/A 10/12/2020   Procedure: RADIOLOGY WITH ANESTHESIA;  Surgeon: Radiologist, Medication, MD;  Location: [facility] OR;  Service: Radiology;  Laterality: N/A;   RADIOLOGY WITH ANESTHESIA N/A 10/11/2020   Procedure: IR WITH ANESTHESIA;  Surgeon: Radiologist, Medication, MD;  Location: [facility] OR;  Service: Radiology;  Laterality: N/A;   TEE WITHOUT CARDIOVERSION N/A 10/17/2020   Procedure: TRANSESOPHAGEAL ECHOCARDIOGRAM (TEE);  Surgeon: Jake Bathe, MD;  Location: [facility]  ENDOSCOPY;  Service: Cardiovascular;  Laterality: N/A;    There were no vitals filed for this visit.   Subjective Assessment - 12/05/20 0803     Subjective  "I got the ok from my cardiologist to go back to work, so I asked my boss to put me on part time starting in a few weeks."    Patient is accompanied by: Family member    Pertinent History Recent R MCA infarct on 10/11/20, underwent  mechanical thrombectomy on 10/11/20, a second time on 10/12/20.    Limitations L sided hemiparesis, decreased balance, coordination, decreased indep with ADLs/IADLs/work/leisure    Patient Stated Goals Return to work and hold her 58 y/o son.    Currently in Pain? No/denies    Pain Score 0-No pain    Pain Onset In the past 7 days            Occupational Therapy Treatment: Self Care: Practiced typing on laptop for return to work prep.  Pt "points and pecks" at baseline with both hands, vs standard touch typing with 10 fingers.  Pt verbalized that she had to consciously maker herself use her L hand, but when doing so typing was accurate.  Pt does not have a computer at home but OT encouraged pt utilize a family Psychologist, occupational for typing practice in prep for return to work.  Pt receptive.   Therapeutic Exercise: Completed Sci fit x8 min a level 4; min vc to increase effort with LUE as R arm became fatigued and pt noted that she was favoring RUE while pedaling.  Pt alternated forward/backward motion every 2 min.  Used hand gripper for L grip strengthening, moderate resistance, completing 5 sets 10 reps.  Facilitated pinch strengthening with use of therapy resistant clothespins to target lateral and 3 point pinch of R/L hand.  Able to pinch yellow, red (light resistance), green, blue (moderate resistance), and black pins (heavy resistance) with good tolerance.  Facilitated forward flexion abd, and horiz add moving vertical target to clip pins.   Response to Treatment: See Plan/clinical impression  below.    OT Education - 12/05/20 0805     Education Details HEP progression    Person(s) Educated Patient    Methods Explanation;Verbal cues    Comprehension Verbalized understanding;Verbal cues required;Need further instruction              OT Short Term Goals - 10/29/20 1247       OT SHORT TERM GOAL #1   Title Pt will perform HEP with min vc for improving LUE strength and coordination    Baseline Eval: initiated theraputty HEP at eval; further training needed    Time 6    Period Weeks    Status New    Target Date 12/08/20               OT Long Term Goals - 11/27/20 1622       OT LONG TERM GOAL #1   Title Pt will improve FOTO score to indicate improved functional performance.    Baseline 10th visit:  FOTO score: 77 Eval: incomplete FOTO, will complete next session    Time 12    Period Weeks    Status On-going    Target Date 01/19/21      OT LONG TERM GOAL #2   Title Pt will improve LUE strength by to enable pt to hold 5 y/o son and lift/carry heavy supplies for work and ADLs.    Baseline 1)th visit: UE strength is improving. Pt. is able to lift her son out of a shopping cart, however is unable to hold him for a duration.  Eval: Pt can not lift son (35-40 lbs).  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    Time 12    Period Weeks    Status On-going    Target Date 01/19/21      OT LONG TERM GOAL #3   Title Pt will improve LUE GMC/FMC skills to enable her to braid her own hair.    Baseline 10th visit: Pt. has not tried due to cutting her hair, however would like to keep this goal. Eval: Dependent to braid hair    Time 12    Period Weeks    Status On-going    Target Date 01/19/21      OT LONG TERM GOAL #4   Title Pt will improve L grip and pinch strength to enable her to open bottles and containers with modified indep.    Baseline 10th visit: Left grip strength: 25 lbs, lateral pInch strength: 11#, 3pt. pinch: 8#  pt. is  able to open bottles. Eval: Requires assist to open bottles and containers (L grip 20 lbs, R 56; L lateral pinch 10 lbs, R 16, L 3 point pinch 6 lbs, R 12.  Time 12    Period Weeks    Status On-going    Target Date 01/19/21      OT LONG TERM GOAL #5   Title Pt will improve BUE coordination to enable indep sweeping, making beds, and other light home making tasks.    Baseline 10th visit: Pt. is able to perfrom laundry, and dishes. Pt. requires assist for bedmaking tasks. Eval: assist needed with IADL tasks which require use of 2 hands.    Time 12    Period Weeks    Status On-going               Plan - 12/05/20 6389     Clinical Impression Statement Pt steadily progressing with LUE strength and coordination.  Pt reports she was able to walk her dog around the block independently for the first time yesterday.  Pt reports today was the first day she was able to clip the black pins consistently during pinch strength activity.  When typing, pt reported increased attention needed to ensure L hand was engaged.  Pt continues to present with LUE weakness and coordination deficits.   Pt will continue to benefit from skilled OT for increasing strength and coordination in order to improve accuracy and efficiency with ADL/IADL/work tasks.    OT Occupational Profile and History Detailed Assessment- Review of Records and additional review of physical, cognitive, psychosocial history related to current functional performance    Occupational performance deficits (Please refer to evaluation for details): ADL's;IADL's;Leisure;Work    Games developer / Function / Physical Skills ADL;Coordination;Endurance;UE functional use;Balance;Body mechanics;IADL;Dexterity;FMC;Strength;Mobility;ROM    Rehab Potential Excellent    Clinical Decision Making Several treatment options, min-mod task modification necessary    Comorbidities Affecting Occupational Performance: May have comorbidities impacting occupational  performance    Modification or Assistance to Complete Evaluation  Min-Moderate modification of tasks or assist with assess necessary to complete eval    OT Frequency 2x / week    OT Duration 12 weeks    OT Treatment/Interventions Self-care/ADL training;Therapeutic exercise;DME and/or AE instruction;Neuromuscular education;Manual Therapy;Passive range of motion;Therapeutic activities;Patient/family education    Consulted and Agree with Plan of Care Patient;Family member/caregiver             Patient will benefit from skilled therapeutic intervention in order to improve the following deficits and impairments:   Body Structure / Function / Physical Skills: ADL, Coordination, Endurance, UE functional use, Balance, Body mechanics, IADL, Dexterity, FMC, Strength, Mobility, ROM       Visit Diagnosis: Muscle weakness (generalized)  Other lack of coordination    Problem List Patient Active Problem List   Diagnosis Date Noted   Anemia    Hemiparesis affecting left side as late effect of stroke (HCC)    Leukocytosis    Dyslipidemia    Spastic hemiparesis (HCC)    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   Long term current use of hormonal contraceptive 10/12/2020   Recurrent strokes (HCC) 10/11/2020   Middle cerebral artery embolism, 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   Nicotine vapor product user 10/06/2019   Migraines  05/09/2019   Smoker 1-2 Black & Milds/day 12/26/2018   LGSIL on Pap smear of cervix 08/18/2018   Encounter for surveillance of injectable contraceptive 08/18/2018   Generalized anxiety disorder with panic attacks 07/26/2017   ADD (attention deficit disorder) 07/07/2017   Obesity (BMI 30-39.9) 07/22/2015   Danelle Earthly, MS, OTR/L  Otis Dials, OT/L 12/06/2020, 8:23 AM  [place]  [facility] MAIN [facility] SERVICES [address] [place], [place], [address] Phone: [phone]   Fax:   [phone]  Name: Kristen Grant MRN: [id number] Date of Birth: [birth year]

## 2020-12-07 LAB — BASIC METABOLIC PANEL
BUN/Creatinine Ratio: 10 (ref 9–23)
BUN: 6 mg/dL (ref 6–20)
CO2: 22 mmol/L (ref 20–29)
Calcium: 9.3 mg/dL (ref 8.7–10.2)
Chloride: 104 mmol/L (ref 96–106)
Creatinine, Ser: 0.6 mg/dL (ref 0.57–1.00)
Glucose: 110 mg/dL — ABNORMAL HIGH (ref 70–99)
Potassium: 3.5 mmol/L (ref 3.5–5.2)
Sodium: 139 mmol/L (ref 134–144)
eGFR: 128 mL/min/{1.73_m2} (ref >59)

## 2020-12-07 LAB — CBC
Hematocrit: 37.6 % (ref 34.0–46.6)
Hemoglobin: 13 g/dL (ref 11.1–15.9)
MCH: 29 pg (ref 26.6–33.0)
MCHC: 34.6 g/dL (ref 31.5–35.7)
MCV: 84 fL (ref 79–97)
Platelets: 307 10*3/uL (ref 150–450)
RBC: 4.49 x10E6/uL (ref 3.77–5.28)
RDW: 13.4 % (ref 11.7–15.4)
WBC: 8.2 10*3/uL (ref 3.4–10.8)

## 2020-12-09 ENCOUNTER — Other Ambulatory Visit: Payer: Self-pay

## 2020-12-09 ENCOUNTER — Ambulatory Visit: Payer: Managed Care, Other (non HMO) | Admitting: Occupational Therapy

## 2020-12-09 ENCOUNTER — Ambulatory Visit: Payer: Managed Care, Other (non HMO)

## 2020-12-09 ENCOUNTER — Encounter: Payer: Self-pay | Admitting: Occupational Therapy

## 2020-12-09 DIAGNOSIS — R269 Unspecified abnormalities of gait and mobility: Secondary | ICD-10-CM | POA: Diagnosis not present

## 2020-12-09 DIAGNOSIS — R2681 Unsteadiness on feet: Secondary | ICD-10-CM

## 2020-12-09 DIAGNOSIS — R278 Other lack of coordination: Secondary | ICD-10-CM

## 2020-12-09 DIAGNOSIS — Q2112 Patent foramen ovale: Secondary | ICD-10-CM

## 2020-12-09 DIAGNOSIS — M6281 Muscle weakness (generalized): Secondary | ICD-10-CM

## 2020-12-09 DIAGNOSIS — R2689 Other abnormalities of gait and mobility: Secondary | ICD-10-CM

## 2020-12-09 NOTE — Therapy (Signed)
[place] [facility] MAIN [facility] SERVICES [address] [place], [place], [address] Phone: [phone]   Fax:  [phone]  Physical Therapy Treatment  Patient Details  Name: Kristen Grant MRN: [id number] Date of Birth: May 06, 1995 Referring Provider (PT): Mariam Dollar   Encounter Date: 12/09/2020   PT End of Session - 12/09/20 1148     Visit Number 11    Number of Visits 25    Date for PT Re-Evaluation 01/20/21    Authorization Type [employer] Man[employer]    Authorization Time Period 10/28/20-01/20/21; PN on 12/05/2020    Progress Note Due on Visit 10    PT Start Time 1100    PT Stop Time 1143    PT Time Calculation (min) 43 min    Equipment Utilized During Treatment Gait belt    Activity Tolerance Patient tolerated treatment well;No increased pain    Behavior During Therapy WFL for tasks assessed/performed             Past Medical History:  Diagnosis Date   Anxiety    Asthma    no treatments required at this time   Migraine headache     Past Surgical History:  Procedure Laterality Date   BUBBLE STUDY  10/17/2020   Procedure: BUBBLE STUDY;  Surgeon: Jake Bathe, MD;  [facility] ENDOSCOPY;  Service: Cardiovascular;;   IR ANGIO INTRA EXTRACRAN SEL COM CAROTID INNOMINATE UNI L MOD SED  10/11/2020   IR ANGIO VERTEBRAL SEL VERTEBRAL UNI L MOD SED  10/11/2020   IR CT HEAD LTD  10/11/2020   IR CT HEAD LTD  10/12/2020   IR PERCUTANEOUS ART THROMBECTOMY/INFUSION INTRACRANIAL INC DIAG ANGIO  10/11/2020   IR PERCUTANEOUS ART THROMBECTOMY/INFUSION INTRACRANIAL INC DIAG ANGIO  10/12/2020   NO PAST SURGERIES     RADIOLOGY WITH ANESTHESIA N/A 10/12/2020   Procedure: RADIOLOGY WITH ANESTHESIA;  Surgeon: Radiologist, Medication, MD;  Location: [facility] OR;  Service: Radiology;  Laterality: N/A;   RADIOLOGY WITH ANESTHESIA N/A 10/11/2020   Procedure: IR WITH ANESTHESIA;  Surgeon: Radiologist, Medication, MD;  Location: [facility] OR;  Service: Radiology;  Laterality: N/A;   TEE  WITHOUT CARDIOVERSION N/A 10/17/2020   Procedure: TRANSESOPHAGEAL ECHOCARDIOGRAM (TEE);  Surgeon: Jake Bathe, MD;  Location: [facility] ENDOSCOPY;  Service: Cardiovascular;  Laterality: N/A;    There were no vitals filed for this visit.   Subjective Assessment - 12/09/20 1053     Subjective Pt reports no pain. Pt reports no stumbles or falls. Pt reports she drags her foot after walking for a long period of time.    Patient is accompained by: Family member    Pertinent History Kristen Grant is a 25 year old right-handed female with morbid obesity BMI 29.92, anxiety, migraine headaches, tobacco use.  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  Cranial CT scan showed hypodensity in the right anterior temporal lobe concerning for infarct.  Pertaining to patient's right MCA infarct predominantly right caudate putamen anterior limb right internal capsule secondary to right M1 occlusion status post revascularization mechanical thrombectomy requiring repeat thrombectomy 10/12/2020 for occluded dominant superior division of right MCA.    Limitations Walking;House hold activities    How long can you sit comfortably? no limitations    How long can you stand comfortably? 30 min    How long can you walk comfortably? 20 min  Patient Stated Goals To improve my balance and endurance back to my previous level of working full time and taking care of 5 year son    Currently in Pain? No/denies    Pain Onset In the past 7 days            INTERVENTIONS   Continued interval training on NuStep, for cardiorespiratory and LE muscular endurance with PT pacing pt and monitoring exertion: 1 minute, level 3, self-selected pace; 30 seconds, level 6, SPM >70; 1 minute, level 3, self-selected pace; 30 seconds, level 6, SPM >70; 1 minute, level 3,  self-selected pace; 30 seconds, level 6, SPM >70; 1 minute cool down. Pt reports RLE fatigue.   BTB seated ankle DF - 15x, 2x20 BLEs  Standing single leg heel raise 3x10   Leg press: 40# 1x10; Pt rate easy 55# 1x10; Pt rates easy  70# 1x10; pt rates as medium.  Gait in clinic with focus on heel-toe sequencing. Pt with increased fatigue of R hip abductor as well. 4x length of hallway, and 2x length of 10 meter track. Still with audible slap.   Standing hip abduction (bodyweight) fatiguing 10x, 2x15 BLEs  Side-lying hip abduction - 1x15 LLE, 2x10 RLE (reports more fatiguing on RLE)  Standing on airex pad, NOBS, CGA, at support surface -  EC 60 sec EC, horizontal head turns 10x EC, vertical head turns 10x - rates more challenging, increased sway     Pt educated throughout session about proper posture and technique with exercises. Improved exercise technique, movement at target joints, use of target muscles after min to mod verbal, visual, tactile cues.    PT Education - 12/09/20 1256     Education Details exercise technique, body mechanics    Person(s) Educated Patient    Methods Explanation;Demonstration;Verbal cues    Comprehension Verbalized understanding;Returned demonstration;Verbal cues required;Need further instruction              PT Short Term Goals - 12/05/20 1332       PT SHORT TERM GOAL #1   Title Pt will be independent with HEP in order to improve strength and balance in order to decrease fall risk and improve function at home and work.    Baseline 10/28/2020- Patient has no formal HEP in place; 12/05/2020=Patient verbalized understanding of balance/walking HEP and states no problem or difficulty performing at this time    Time 6    Period Weeks    Status Achieved    Target Date 12/09/20               PT Long Term Goals - 12/05/20 1332       PT LONG TERM GOAL #1   Title Pt will improve FOTO to target score of 75 to  display perceived improvements  in ability to complete ADL's.    Baseline 10/28/2020= 71; 12/05/2020= 74    Time 12    Period Weeks    Status On-going    Target Date 01/20/21      PT LONG TERM GOAL #2   Title Pt will decrease 5TSTS by at least 3 seconds in order to demonstrate clinically significant improvement in LE strength    Baseline 10/28/2020= 14.61 sec without UE support; 12/05/2020= 13.60 sec without UE support    Time 12    Period Weeks    Status On-going      PT LONG TERM GOAL #3   Title Patient will increase Functional Gait Assessment score to >26/30 as  to reduce fall risk and improve dynamic gait safety with community ambulation.    Baseline 10/28/2020= 23/30; 12/05/2020= 30/30    Time 12    Period Weeks    Status Achieved                   Plan - 12/09/20 1144     Clinical Impression Statement Pt exhibits increased foot slap and decreased quad control with gait once LLE fatigued with interventions. Pt also reports increased fatigue of R hip abductors with endurance related activities. Session continued to focus on LE strengthening and heel-toe sequencing with gait. The pt will benefit from further skilled PT to improve LE strength, mobility and balance.    Personal Factors and Comorbidities Comorbidity 1    Comorbidities CVA    Examination-Activity Limitations Caring for Others;Stairs    Examination-Participation Restrictions Community Activity;Occupation;Yard Work    Stability/Clinical Decision Making Stable/Uncomplicated    Rehab Potential Good    PT Frequency 2x / week    PT Duration 12 weeks    PT Treatment/Interventions ADLs/Self Care Home Management;Cryotherapy;Moist Heat;DME Instruction;Gait training;Stair training;Functional mobility training;Therapeutic activities;Therapeutic exercise;Balance training;Neuromuscular re-education;Patient/family education;Manual techniques;Passive range of motion;Dry needling    PT Next Visit Plan Heavy focus needed on isolated hip flexion and ankle DF  strengthening (persistent deficit); LLE fine motor control training;    PT Home Exercise Plan 10/31/2020: Access Code: [email or web address]  (balance exercises); no updates    Consulted and Agree with Plan of Care Patient             Patient will benefit from skilled therapeutic intervention in order to improve the following deficits and impairments:  Abnormal gait, Decreased activity tolerance, Decreased balance, Decreased coordination, Decreased endurance, Cardiopulmonary status limiting activity, Decreased mobility, Decreased strength, Difficulty walking, Impaired UE functional use  Visit Diagnosis: Muscle weakness (generalized)  Other abnormalities of gait and mobility  Unsteadiness on feet  Other lack of coordination     Problem List Patient Active Problem List   Diagnosis Date Noted   Anemia    Hemiparesis affecting left side as late effect of stroke (HCC)    Leukocytosis    Dyslipidemia    Spastic hemiparesis (HCC)    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   Long term current use of hormonal contraceptive 10/12/2020   Recurrent strokes (HCC) 10/11/2020   Middle cerebral artery embolism, 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   Nicotine vapor product user 10/06/2019   Migraines  05/09/2019   Smoker 1-2 Black & Milds/day 12/26/2018   LGSIL on Pap smear of cervix 08/18/2018   Encounter for surveillance of injectable contraceptive 08/18/2018   Generalized anxiety disorder with panic attacks 07/26/2017   ADD (attention deficit disorder) 07/07/2017   Obesity (BMI 30-39.9) 07/22/2015    Baird Kay, PT 12/09/2020, 12:57 PM  [facility] MAIN [facility] SERVICES [address] [place], [place], [address] Phone: [phone]   Fax:  [phone]  Name: Kristen Grant MRN: [id number] Date of Birth: 04-09-1995

## 2020-12-09 NOTE — Therapy (Signed)
[facility] MAIN [facility] SERVICES [address] [place], [place], [address] Phone: [phone]   Fax:  [phone]  Occupational Therapy Treatment  Patient Details  Name: Kristen Grant MRN: [id number] Date of Birth: 1995-03-05 Referring Provider (OT): Mariam Dollar, [place]   Encounter Date: 12/09/2020   OT End of Session - 12/09/20 1355     Visit Number 12    Number of Visits 24    Date for OT Re-Evaluation 01/19/21    Authorization Type Progress report period starting 11/27/2020    OT Start Time 1015    OT Stop Time 1100    OT Time Calculation (min) 45 min    Activity Tolerance Patient tolerated treatment well    Behavior During Therapy WFL for tasks assessed/performed             Past Medical History:  Diagnosis Date   Anxiety    Asthma    no treatments required at this time   Migraine headache     Past Surgical History:  Procedure Laterality Date   BUBBLE STUDY  10/17/2020   Procedure: BUBBLE STUDY;  Surgeon: Jake Bathe, MD;  Location: [facility] ENDOSCOPY;  Service: Cardiovascular;;   IR ANGIO INTRA EXTRACRAN SEL COM CAROTID INNOMINATE UNI L MOD SED  10/11/2020   IR ANGIO VERTEBRAL SEL VERTEBRAL UNI L MOD SED  10/11/2020   IR CT HEAD LTD  10/11/2020   IR CT HEAD LTD  10/12/2020   IR PERCUTANEOUS ART THROMBECTOMY/INFUSION INTRACRANIAL INC DIAG ANGIO  10/11/2020   IR PERCUTANEOUS ART THROMBECTOMY/INFUSION INTRACRANIAL INC DIAG ANGIO  10/12/2020   NO PAST SURGERIES     RADIOLOGY WITH ANESTHESIA N/A 10/12/2020   Procedure: RADIOLOGY WITH ANESTHESIA;  Surgeon: Radiologist, Medication, MD;  Location: [facility] OR;  Service: Radiology;  Laterality: N/A;   RADIOLOGY WITH ANESTHESIA N/A 10/11/2020   Procedure: IR WITH ANESTHESIA;  Surgeon: Radiologist, Medication, MD;  Location: [facility] OR;  Service: Radiology;  Laterality: N/A;   TEE WITHOUT CARDIOVERSION N/A 10/17/2020   Procedure: TRANSESOPHAGEAL ECHOCARDIOGRAM (TEE);  Surgeon: Jake Bathe, MD;  Location: [facility]  ENDOSCOPY;  Service: Cardiovascular;  Laterality: N/A;    There were no vitals filed for this visit.   Subjective Assessment - 12/09/20 1354     Subjective  Pt. reports that she is going to return to work part time in a few weeks.    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    Currently in Pain? No/denies            OT TREATMENT    Therapeutic Exercise:  Pt. Worked on the [employer] for 8 min. With constant monitoring of the BUEs. Pt. Worked on changing, and alternating forward reverse position every 2 min. Rest breaks were required.  Pt. Worked on pinch strengthening in the left hand for lateral, and 3pt. pinch using yellow, red, green, and blue resistive clips. Pt. worked on placing the clips at various vertical and horizontal angles. Tactile and verbal cues were required for eliciting the desired movement.   Pt. reports that she has a cardiac surgical procedure this week, and may not be able to attend her Wednesday therapy session. Pt. reports that her cardiologist has cleared her to return to work, and reports that she will return part-time soon. Pt. Performed typing tasks, and worked on tasks to improve speed, and accuracy.  Pt. completed a 10 min. typing test in 21 wpm, with 95% accuracy with 10 typos. Pt. completed a 3 min typing task with 100% accuracy with 20 wpm. Pt. continues to work on improving typing skills in order to work towards improving typing speed, and accuracy needed for work related typing tasks.                      OT Education - 12/09/20 1355     Education Details HEP progression    Person(s) Educated Patient    Methods Explanation;Verbal cues    Comprehension Verbalized understanding;Verbal cues required;Need further instruction              OT Short Term Goals - 10/29/20 1247       OT SHORT TERM GOAL #1   Title  Pt will perform HEP with min vc for improving LUE strength and coordination    Baseline Eval: initiated theraputty HEP at eval; further training needed    Time 6    Period Weeks    Status New    Target Date 12/08/20               OT Long Term Goals - 11/27/20 1622       OT LONG TERM GOAL #1   Title Pt will improve FOTO score to indicate improved functional performance.    Baseline 10th visit:  FOTO score: 77 Eval: incomplete FOTO, will complete next session    Time 12    Period Weeks    Status On-going    Target Date 01/19/21      OT LONG TERM GOAL #2   Title Pt will improve LUE strength by to enable pt to hold 5 y/o son and lift/carry heavy supplies for work and ADLs.    Baseline 1)th visit: UE strength is improving. Pt. is able to lift her son out of a shopping cart, however is unable to hold him for a duration.  Eval: Pt can not lift son (35-40 lbs).  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    Time 12    Period Weeks    Status On-going    Target Date 01/19/21      OT LONG TERM GOAL #3   Title Pt will improve LUE GMC/FMC skills to enable her to braid her own hair.    Baseline 10th visit: Pt. has not tried due to cutting her hair, however would like to keep this goal. Eval: Dependent to braid hair    Time 12    Period Weeks    Status On-going    Target Date 01/19/21      OT LONG TERM GOAL #4   Title Pt will improve L grip and pinch strength to enable her to open bottles and containers with modified indep.    Baseline 10th visit: Left grip strength: 25 lbs, lateral pInch strength: 11#, 3pt. pinch: 8#  pt. is able to open bottles. Eval: Requires assist to open bottles and containers (L grip 20 lbs, R 56; L lateral pinch 10 lbs, R 16, L 3 point pinch 6 lbs, R 12.    Time 12    Period Weeks    Status On-going    Target Date 01/19/21      OT LONG TERM GOAL #5   Title Pt will improve BUE coordination to enable indep sweeping, making  beds, and other light home making tasks.  Baseline 10th visit: Pt. is able to perfrom laundry, and dishes. Pt. requires assist for bedmaking tasks. Eval: assist needed with IADL tasks which require use of 2 hands.    Time 12    Period Weeks    Status On-going                   Plan - 12/09/20 1355     Clinical Impression Statement Pt. reports that she has a cardiac surgical procedure this week, and may not be able to attend her Wednesday therapy session. Pt. reports that her cardiologist has cleared her to return to work, and reports that she will return part-time soon. Pt. Performed typing tasks, and worked on tasks to improve speed, and accuracy. Pt. completed a 10 min. typing test in 21 wpm, with 95% accuracy with 10 typos. Pt. completed a 3 min typing task with 100% accuracy with 20 wpm. Pt. continues to work on improving typing skills in order to work towards improving typing speed, and accuracy needed for work related typing tasks.        OT Occupational Profile and History Detailed Assessment- Review of Records and additional review of physical, cognitive, psychosocial history related to current functional performance    Occupational performance deficits (Please refer to evaluation for details): ADL's;IADL's;Leisure;Work    Games developer / Function / Physical Skills ADL;Coordination;Endurance;UE functional use;Balance;Body mechanics;IADL;Dexterity;FMC;Strength;Mobility;ROM    Rehab Potential Excellent    Clinical Decision Making Several treatment options, min-mod task modification necessary    Comorbidities Affecting Occupational Performance: May have comorbidities impacting occupational performance    Modification or Assistance to Complete Evaluation  Min-Moderate modification of tasks or assist with assess necessary to complete eval    OT Frequency 2x / week    OT Duration 12 weeks    OT Treatment/Interventions Self-care/ADL training;Therapeutic exercise;DME and/or AE  instruction;Neuromuscular education;Manual Therapy;Passive range of motion;Therapeutic activities;Patient/family education    OT Home Exercise Plan Theraputty HEP issued at eval    Consulted and Agree with Plan of Care Patient;Family member/caregiver             Patient will benefit from skilled therapeutic intervention in order to improve the following deficits and impairments:   Body Structure / Function / Physical Skills: ADL, Coordination, Endurance, UE functional use, Balance, Body mechanics, IADL, Dexterity, FMC, Strength, Mobility, ROM       Visit Diagnosis: Muscle weakness (generalized)    Problem List Patient Active Problem List   Diagnosis Date Noted   Anemia    Hemiparesis affecting left side as late effect of stroke (HCC)    Leukocytosis    Dyslipidemia    Spastic hemiparesis (HCC)    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   Long term current use of hormonal contraceptive 10/12/2020   Recurrent strokes (HCC) 10/11/2020   Middle cerebral artery embolism, 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 [place], [place], [address] Phone: [phone]   Fax:  [phone]  Name: Kristen Grant MRN: [id number] Date of  Birth: [birth year]

## 2020-12-10 ENCOUNTER — Ambulatory Visit (HOSPITAL_COMMUNITY)
Admission: RE | Admit: 2020-12-10 | Discharge: 2020-12-10 | Disposition: A | Discharge status: Home / Self Care | Payer: Managed Care, Other (non HMO) | Source: Ambulatory Visit | Attending: Cardiology | Admitting: Cardiology

## 2020-12-10 ENCOUNTER — Other Ambulatory Visit: Payer: Self-pay

## 2020-12-10 ENCOUNTER — Encounter (HOSPITAL_COMMUNITY)
Admission: RE | Disposition: A | Discharge status: Home / Self Care | Payer: Managed Care, Other (non HMO) | Source: Ambulatory Visit | Attending: Cardiology

## 2020-12-10 ENCOUNTER — Encounter (HOSPITAL_COMMUNITY): Payer: Self-pay | Admitting: Cardiology

## 2020-12-10 ENCOUNTER — Ambulatory Visit (HOSPITAL_COMMUNITY): Payer: Managed Care, Other (non HMO)

## 2020-12-10 DIAGNOSIS — Z8249 Family history of ischemic heart disease and other diseases of the circulatory system: Secondary | ICD-10-CM | POA: Diagnosis not present

## 2020-12-10 DIAGNOSIS — Z87891 Personal history of nicotine dependence: Secondary | ICD-10-CM | POA: Diagnosis not present

## 2020-12-10 DIAGNOSIS — Z8673 Personal history of transient ischemic attack (TIA), and cerebral infarction without residual deficits: Secondary | ICD-10-CM | POA: Diagnosis not present

## 2020-12-10 DIAGNOSIS — Q2112 Patent foramen ovale: Secondary | ICD-10-CM | POA: Diagnosis present

## 2020-12-10 DIAGNOSIS — I6601 Occlusion and stenosis of right middle cerebral artery: Secondary | ICD-10-CM | POA: Diagnosis not present

## 2020-12-10 DIAGNOSIS — Z79899 Other long term (current) drug therapy: Secondary | ICD-10-CM | POA: Diagnosis not present

## 2020-12-10 DIAGNOSIS — Z7982 Long term (current) use of aspirin: Secondary | ICD-10-CM | POA: Insufficient documentation

## 2020-12-10 HISTORY — PX: PATENT FORAMEN OVALE(PFO) CLOSURE: CATH118300

## 2020-12-10 LAB — POCT ACTIVATED CLOTTING TIME
Activated Clotting Time: 231 seconds
Activated Clotting Time: 260 seconds

## 2020-12-10 LAB — PREGNANCY, URINE: Preg Test, Ur: NEGATIVE

## 2020-12-10 LAB — ECHOCARDIOGRAM LIMITED
Height: 69 in
Weight: 3200 oz

## 2020-12-10 SURGERY — PATENT FORAMEN OVALE (PFO) CLOSURE
Anesthesia: LOCAL

## 2020-12-10 MED ORDER — FAMOTIDINE IN NACL 20-0.9 MG/50ML-% IV SOLN
20.0000 mg | Freq: Once | INTRAVENOUS | Status: AC
  Administered 2020-12-10: 20 mg via INTRAVENOUS
  Filled 2020-12-10 (×2): qty 50

## 2020-12-10 MED ORDER — ONDANSETRON HCL 4 MG/2ML IJ SOLN: 4.0000 mg | Freq: Four times a day (QID) | INTRAMUSCULAR | Status: DC | PRN

## 2020-12-10 MED ORDER — FENTANYL CITRATE (PF) 100 MCG/2ML IJ SOLN
INTRAMUSCULAR | Status: AC
  Filled 2020-12-10: qty 2

## 2020-12-10 MED ORDER — DICYCLOMINE HCL 10 MG/5ML PO SOLN
10.0000 mg | Freq: Once | ORAL | Status: AC
  Administered 2020-12-10: 10 mg via ORAL
  Filled 2020-12-10: qty 5

## 2020-12-10 MED ORDER — CLOPIDOGREL BISULFATE 300 MG PO TABS
ORAL_TABLET | ORAL | Status: AC
  Filled 2020-12-10: qty 2

## 2020-12-10 MED ORDER — CLOPIDOGREL BISULFATE 75 MG PO TABS: 75.0000 mg | ORAL_TABLET | Freq: Every day | ORAL | 0 refills | Status: AC

## 2020-12-10 MED ORDER — FENTANYL CITRATE (PF) 100 MCG/2ML IJ SOLN
INTRAMUSCULAR | Status: DC | PRN
  Administered 2020-12-10: 25 ug via INTRAVENOUS
  Administered 2020-12-10 (×2): 50 ug via INTRAVENOUS
  Administered 2020-12-10: 25 ug via INTRAVENOUS

## 2020-12-10 MED ORDER — CEFAZOLIN SODIUM-DEXTROSE 2-4 GM/100ML-% IV SOLN
INTRAVENOUS | Status: AC
  Filled 2020-12-10: qty 100

## 2020-12-10 MED ORDER — LIDOCAINE-EPINEPHRINE 1 %-1:100000 IJ SOLN
INTRAMUSCULAR | Status: DC | PRN
  Administered 2020-12-10: 10 mL

## 2020-12-10 MED ORDER — SODIUM CHLORIDE 0.9 % IV SOLN: 250.0000 mL | INTRAVENOUS | Status: DC | PRN

## 2020-12-10 MED ORDER — ACETAMINOPHEN 325 MG PO TABS: 650.0000 mg | ORAL_TABLET | ORAL | Status: DC | PRN

## 2020-12-10 MED ORDER — CEFAZOLIN SODIUM-DEXTROSE 2-4 GM/100ML-% IV SOLN
2.0000 g | INTRAVENOUS | Status: AC
  Administered 2020-12-10: 2 g via INTRAVENOUS
  Filled 2020-12-10: qty 100

## 2020-12-10 MED ORDER — CLOPIDOGREL BISULFATE 300 MG PO TABS
ORAL_TABLET | ORAL | Status: DC | PRN
  Administered 2020-12-10: 600 mg via ORAL

## 2020-12-10 MED ORDER — SODIUM CHLORIDE 0.9 % WEIGHT BASED INFUSION: 1.0000 mL/kg/h | INTRAVENOUS | Status: DC

## 2020-12-10 MED ORDER — SODIUM CHLORIDE 0.9 % WEIGHT BASED INFUSION
3.0000 mL/kg/h | INTRAVENOUS | Status: AC
  Administered 2020-12-10: 3 mL/kg/h via INTRAVENOUS

## 2020-12-10 MED ORDER — LIDOCAINE-EPINEPHRINE 1 %-1:100000 IJ SOLN
INTRAMUSCULAR | Status: AC
  Filled 2020-12-10: qty 1

## 2020-12-10 MED ORDER — MIDAZOLAM HCL 2 MG/2ML IJ SOLN
INTRAMUSCULAR | Status: AC
  Filled 2020-12-10: qty 2

## 2020-12-10 MED ORDER — OXYCODONE-ACETAMINOPHEN 10-325 MG PO TABS: 1.0000 | ORAL_TABLET | Freq: Four times a day (QID) | ORAL | 0 refills | Status: DC | PRN

## 2020-12-10 MED ORDER — HEPARIN (PORCINE) IN NACL 1000-0.9 UT/500ML-% IV SOLN
INTRAVENOUS | Status: DC | PRN
  Administered 2020-12-10 (×2): 500 mL

## 2020-12-10 MED ORDER — HEPARIN SODIUM (PORCINE) 1000 UNIT/ML IJ SOLN
INTRAMUSCULAR | Status: DC | PRN
  Administered 2020-12-10: 2000 [IU] via INTRAVENOUS
  Administered 2020-12-10: 8000 [IU] via INTRAVENOUS
  Administered 2020-12-10: 1000 [IU] via INTRAVENOUS
  Administered 2020-12-10: 2000 [IU] via INTRAVENOUS

## 2020-12-10 MED ORDER — LIDOCAINE HCL (PF) 1 % IJ SOLN
INTRAMUSCULAR | Status: DC | PRN
  Administered 2020-12-10: 15 mL

## 2020-12-10 MED ORDER — ASPIRIN EC 325 MG PO TBEC: 325.0000 mg | DELAYED_RELEASE_TABLET | Freq: Every day | ORAL | Status: DC

## 2020-12-10 MED ORDER — SODIUM CHLORIDE 0.9% FLUSH: 3.0000 mL | INTRAVENOUS | Status: DC | PRN

## 2020-12-10 MED ORDER — LIDOCAINE HCL (PF) 1 % IJ SOLN
INTRAMUSCULAR | Status: AC
  Filled 2020-12-10: qty 30

## 2020-12-10 MED ORDER — ASPIRIN 81 MG PO CHEW
81.0000 mg | CHEWABLE_TABLET | ORAL | Status: AC
  Administered 2020-12-10: 81 mg via ORAL
  Filled 2020-12-10: qty 1

## 2020-12-10 MED ORDER — CLOPIDOGREL BISULFATE 75 MG PO TABS: 75.0000 mg | ORAL_TABLET | Freq: Every day | ORAL | Status: DC

## 2020-12-10 MED ORDER — MIDAZOLAM HCL 2 MG/2ML IJ SOLN
INTRAMUSCULAR | Status: DC | PRN
  Administered 2020-12-10 (×3): 1 mg via INTRAVENOUS

## 2020-12-10 MED ORDER — SODIUM CHLORIDE 0.9 % WEIGHT BASED INFUSION: 3.0000 mL/kg/h | INTRAVENOUS | Status: DC

## 2020-12-10 MED ORDER — SODIUM CHLORIDE 0.9% FLUSH: 3.0000 mL | Freq: Two times a day (BID) | INTRAVENOUS | Status: DC

## 2020-12-10 SURGICAL SUPPLY — 18 items
CATH INFINITI 6F MPA2 100CM (CATHETERS) ×2 IMPLANT
CATH REPROCESSED 8FR ACUNAV (CATHETERS) ×2 IMPLANT
CLOSURE PERCLOSE PROSTYLE (VASCULAR PRODUCTS) ×4 IMPLANT
ELECT DEFIB PAD ADLT CADENCE (PAD) ×2 IMPLANT
GUIDEWIRE AMPLATZER 1.5JX260 (WIRE) ×2 IMPLANT
OCCLUDER PFO TALISMAN 18-18 (Prosthesis & Implant Heart) IMPLANT
OCCLUDER PFO TALISMAN 25-18 (Prosthesis & Implant Heart) ×1 IMPLANT
PACK CARDIAC CATHETERIZATION (CUSTOM PROCEDURE TRAY) ×2 IMPLANT
PROTECTION STATION PRESSURIZED (MISCELLANEOUS) ×2
SHEATH INTROD W/O MIN 9FR 25CM (SHEATH) ×2 IMPLANT
SHEATH PINNACLE 8F 10CM (SHEATH) ×2 IMPLANT
SHEATH PROBE COVER 6X72 (BAG) ×4 IMPLANT
STATION PROTECTION PRESSURIZED (MISCELLANEOUS) ×1 IMPLANT
SYS DELIVER AMP TREVISIO 8FR (SHEATH) ×2
SYSTEM DELIVER AMP TREVIS 8FR (SHEATH) ×1 IMPLANT
TALISMAN PFO OCCLUDER 18-18 (Prosthesis & Implant Heart) IMPLANT
TALISMAN PFO OCCLUDER 25-18 (Prosthesis & Implant Heart) ×2 IMPLANT
WIRE EMERALD 3MM-J .035X150CM (WIRE) ×2 IMPLANT

## 2020-12-10 NOTE — Progress Notes (Signed)
  Echocardiogram 2D Echocardiogram has been performed.  Janalyn Harder 12/10/2020, 11:29 AM

## 2020-12-10 NOTE — Progress Notes (Signed)
On arrival from cath lab client c/o 6/10 chest pressure and burning; Dr Jacinto Halim notified and orders noted

## 2020-12-10 NOTE — Progress Notes (Signed)
Patient ambulated to BR right groin CDI, site unremarkable.

## 2020-12-10 NOTE — Interval H&P Note (Signed)
History and Physical Interval Note:  12/10/2020 7:48 AM  Kristen Grant  has presented today for surgery, with the diagnosis of PFO.  The various methods of treatment have been discussed with the patient and family. After consideration of risks, benefits and other options for treatment, the patient has consented to  Procedure(s): PATENT FORAMEN OVALE (PFO) CLOSURE (N/A) as a surgical intervention.  The patient's history has been reviewed, patient examined, no change in status, stable for surgery.  I have reviewed the patient's chart and labs.  Questions were answered to the patient's satisfaction.     Yates Decamp

## 2020-12-11 ENCOUNTER — Ambulatory Visit: Payer: Managed Care, Other (non HMO)

## 2020-12-13 NOTE — Progress Notes (Signed)
New Patient Office Visit  Subjective:  Patient ID: Kristen Grant, female    DOB: 1995/08/28  Age: 25 y.o. MRN: [id number]  CC:  Chief Complaint  Patient presents with   Establish Care    Pt states she would like to discuss anxiety and depression, states she used to see therapy but stopped seeing them    Depression   Anxiety    HPI Kristen Grant presents for new patient visit to establish care.  Introduced to Publishing rights manager role and practice setting.  All questions answered.  Discussed provider/patient relationship and expectations.  ANXIETY/STRESS  Duration:uncontrolled Anxious mood: yes  Excessive worrying: yes Irritability: yes  Sweating: no Nausea: yes Palpitations: sometimes Hyperventilation: no Panic attacks: yes Agoraphobia: no  Obscessions/compulsions: yes Depressed mood: yes Depression screen [facility] 2/9 12/16/2020 11/01/2020 10/06/2019  Decreased Interest 3 1 1   Down, Depressed, Hopeless 2 1 2   PHQ - 2 Score 5 2 3   Altered sleeping 3 3 3   Tired, decreased energy 3 3 3   Change in appetite 2 1 3   Feeling bad or failure about yourself  1 1 1   Trouble concentrating 1 1 2   Moving slowly or fidgety/restless 0 1 1  Suicidal thoughts 0 0 0  PHQ-9 Score 15 12 16   Difficult doing work/chores Extremely dIfficult Somewhat difficult Very difficult   GAD 7 : Generalized Anxiety Score 12/16/2020  Nervous, Anxious, on Edge 3  Control/stop worrying 3  Worry too much - different things 3  Trouble relaxing 3  Restless 3  Easily annoyed or irritable 3  Afraid - awful might happen 3  Total GAD 7 Score 21  Anxiety Difficulty Extremely difficult   Anhedonia: no Weight changes: no Insomnia: yes hard to stay asleep  Hypersomnia: no Fatigue/loss of energy: yes Feelings of worthlessness: no Feelings of guilt: no Impaired concentration/indecisiveness: no Suicidal ideations: no  Crying spells: yes Recent Stressors/Life Changes: yes   Relationship problems: no   Family  stress: no     Financial stress: no    Job stress: yes    Recent death/loss: no  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   Stroke [facility])     Past Surgical History:  Procedure Laterality Date   BUBBLE STUDY  10/17/2020   Procedure: BUBBLE STUDY;  Surgeon: , MD;  Location: [facility] ENDOSCOPY;  Service: Cardiovascular;;   IR ANGIO INTRA EXTRACRAN SEL COM CAROTID INNOMINATE UNI L MOD SED  10/11/2020   IR ANGIO VERTEBRAL SEL VERTEBRAL UNI L MOD SED  10/11/2020   IR CT HEAD LTD  10/11/2020   IR CT HEAD LTD  10/12/2020   IR PERCUTANEOUS ART THROMBECTOMY/INFUSION INTRACRANIAL INC DIAG ANGIO  10/11/2020   IR PERCUTANEOUS ART THROMBECTOMY/INFUSION INTRACRANIAL INC DIAG ANGIO  10/12/2020   NO PAST SURGERIES     PATENT FORAMEN OVALE(PFO) CLOSURE N/A 12/10/2020   Procedure: PATENT FORAMEN OVALE (PFO) CLOSURE;  Surgeon: 10/19/2020, MD;  Location: [facility] INVASIVE CV LAB;  Service: Cardiovascular;  Laterality: N/A;   RADIOLOGY WITH ANESTHESIA N/A 10/12/2020   Procedure: RADIOLOGY WITH ANESTHESIA;  Surgeon: Radiologist, Medication, MD;  Location: [facility] OR;  Service: Radiology;  Laterality: N/A;   RADIOLOGY WITH ANESTHESIA N/A 10/11/2020   Procedure: IR WITH ANESTHESIA;  Surgeon: Radiologist,  Medication, MD;  Location: [facility] OR;  Service: Radiology;  Laterality: N/A;   TEE WITHOUT CARDIOVERSION N/A 10/17/2020   Procedure: TRANSESOPHAGEAL ECHOCARDIOGRAM (TEE);  Surgeon: Jake Bathe, MD;  Location: [facility] ENDOSCOPY;  Service: Cardiovascular;  Laterality: N/A;    Family History  Problem Relation Age of Onset   Hypertension Mother    Epilepsy Mother    Anxiety disorder Mother    Depression Mother    Migraines Mother    Diabetes Maternal Grandmother    Stroke Maternal Grandmother    Hypertension Maternal Grandmother    Asthma  Half-Brother    Asthma Half-Sister    Breast cancer Neg Hx     Social History   Socioeconomic History   Marital status: Single    Spouse name: Not on file   Number of children: 1   Years of education: Not on file   Highest education level: GED or equivalent  Occupational History   Not on file  Tobacco Use   Smoking status: Former    Types: E-cigarettes    Start date: 2021   Smokeless tobacco: Never  Vaping Use   Vaping Use: Former   Substances: Nicotine, THC, CBD  Substance and Sexual Activity   Alcohol use: Not Currently   Drug use: Yes    Frequency: 3.0 times per week    Types: Marijuana    Comment: on occasion   Sexual activity: Not Currently    Partners: Male    Birth control/protection: Injection  Other Topics Concern   Not on file  Social History Narrative   Not on file   Social Determinants of Health   Financial Resource Strain: Not on file  Food Insecurity: Not on file  Transportation Needs: Not on file  Physical Activity: Not on file  Stress: Not on file  Social Connections: Not on file  Intimate Partner Violence: Not on file    ROS Review of Systems  Constitutional:  Positive for fatigue.  HENT: Negative.    Eyes: Negative.   Respiratory: Negative.    Cardiovascular: Negative.   Gastrointestinal:  Positive for constipation and nausea. Negative for abdominal pain and diarrhea.  Genitourinary: Negative.   Musculoskeletal: Negative.   Skin: Negative.   Neurological: Negative.   Psychiatric/Behavioral:  Positive for dysphoric mood. The patient is nervous/anxious.    Objective:   Today's Vitals: BP 106/71   Pulse 80   Temp 99.2 F (37.3 C) (Oral)   Ht 5' 7.9" (1.725 m)   Wt 201 lb 6.4 oz (91.4 kg)   LMP 11/19/2020 (Approximate) Comment: reports "spotting" for about 3 weeks.  SpO2 99%   BMI 30.71 kg/m   Physical Exam Vitals and nursing note reviewed.  Constitutional:      General: She is not in acute distress.    Appearance: Normal  appearance.  HENT:     Head: Normocephalic and atraumatic.     Right Ear: Tympanic membrane, ear canal and external ear normal.     Left Ear: Tympanic membrane, ear canal and external ear normal.     Nose: Nose normal.     Mouth/Throat:     Mouth: Mucous membranes are moist.     Pharynx: Oropharynx is clear.  Eyes:     Conjunctiva/sclera: Conjunctivae normal.  Cardiovascular:     Rate and Rhythm: Normal rate and regular rhythm.     Pulses: Normal pulses.     Heart sounds: Normal heart sounds.  Pulmonary:     Effort: Pulmonary effort is  normal.     Breath sounds: Normal breath sounds.  Abdominal:     General: Bowel sounds are normal.     Palpations: Abdomen is soft.     Tenderness: There is no abdominal tenderness.  Musculoskeletal:        General: Normal range of motion.     Cervical back: Normal range of motion.  Skin:    General: Skin is warm and dry.  Neurological:     General: No focal deficit present.     Mental Status: She is alert and oriented to person, place, and time.     Cranial Nerves: No cranial nerve deficit.     Motor: Weakness (left hand weaker than right hand) present.     Coordination: Coordination normal.     Gait: Gait normal.  Psychiatric:        Mood and Affect: Mood normal. Affect is tearful.        Behavior: Behavior normal.        Thought Content: Thought content normal.        Judgment: Judgment normal.    Assessment & Plan:   Problem List Items Addressed This Visit       Cardiovascular and Mediastinum   Migraines     Chronic, controlled with topamax. Continue current regimen. Follow up with any concerns.       Relevant Medications   sertraline (ZOLOFT) 25 MG tablet   PFO (patent foramen ovale)    Recently closed by Dr. Jacinto Halim. Still with bruising and pain to right groin. Continue follow up and recommendations from cardiology.         Nervous and Auditory   Hemiparesis affecting left side as late effect of stroke (HCC)    Actively  working with physical therapy. Has seen significant improvement since being discharged from the hospital. Continues to have some trouble with left foot and mild weakness in left hand.         Other   Generalized anxiety disorder with panic attacks - Primary    Chronic, not controlled. Was going to therapy several years ago. Her GAD 7 is 21 and PHQ 9 is 15. She endorses worsening anxiety and crying spells. Denies SI/HI. Will start zoloft 25 mg daily. Discussed exercise and non-pharmacologic measures for anxiety. Follow up in 4-6 weeks.       Relevant Medications   sertraline (ZOLOFT) 25 MG tablet   Other Relevant Orders   Ambulatory referral to Psychiatry   History of stroke    History of 2 strokes in 10/2020. Continue plavix, aspirin, and lipitor. Recently had PFO closure. Upon reviewing notes from neurologist, most likely caused from birth control (she was taking depo provera injections). Will avoid all hormonal methods of birth control. Keep regular scheduled visits with neurology and cardiology.       Depression, major, single episode, moderate (HCC)    Chronic, not controlled. Was going to therapy several years ago. Her GAD 7 is 21 and PHQ 9 is 15. She endorses worsening anxiety and crying spells. Denies SI/HI. Will start zoloft 25 mg daily. Discussed exercise and non-pharmacologic measures for anxiety. Follow up in 4-6 weeks.       Relevant Medications   sertraline (ZOLOFT) 25 MG tablet   Other Relevant Orders   Ambulatory referral to Psychiatry   Other Visit Diagnoses     Encounter to establish care           Outpatient Encounter Medications as of 12/16/2020  Medication Sig  aspirin 81 MG EC tablet Take 1 tablet (81 mg total) by mouth daily. Swallow whole.   atorvastatin (LIPITOR) 80 MG tablet Take 1 tablet (80 mg total) by mouth daily.   clopidogrel (PLAVIX) 75 MG tablet Take 1 tablet (75 mg total) by mouth daily.   oxyCODONE-acetaminophen (PERCOCET) 10-325 MG tablet Take  1 tablet by mouth every 6 (six) hours as needed for pain.   sertraline (ZOLOFT) 25 MG tablet Take 1 tablet (25 mg total) by mouth daily.   topiramate (TOPAMAX) 25 MG tablet Take 1 tablet (25 mg total) by mouth 2 (two) times daily.   No facility-administered encounter medications on file as of 12/16/2020.    Follow-up: Return in about 4 weeks (around 01/13/2021) for depression, anxiety.   Gerre Scull, NP

## 2020-12-16 ENCOUNTER — Other Ambulatory Visit: Payer: Self-pay

## 2020-12-16 ENCOUNTER — Ambulatory Visit (INDEPENDENT_AMBULATORY_CARE_PROVIDER_SITE_OTHER): Payer: Managed Care, Other (non HMO) | Admitting: Nurse Practitioner

## 2020-12-16 ENCOUNTER — Encounter: Payer: Self-pay | Admitting: Nurse Practitioner

## 2020-12-16 VITALS — BP 106/71 | HR 80 | Temp 99.2°F | Ht 67.9 in | Wt 201.4 lb

## 2020-12-16 DIAGNOSIS — F321 Major depressive disorder, single episode, moderate: Secondary | ICD-10-CM

## 2020-12-16 DIAGNOSIS — Z8673 Personal history of transient ischemic attack (TIA), and cerebral infarction without residual deficits: Secondary | ICD-10-CM

## 2020-12-16 DIAGNOSIS — F411 Generalized anxiety disorder: Secondary | ICD-10-CM | POA: Diagnosis not present

## 2020-12-16 DIAGNOSIS — I69354 Hemiplegia and hemiparesis following cerebral infarction affecting left non-dominant side: Secondary | ICD-10-CM

## 2020-12-16 DIAGNOSIS — Q2112 Patent foramen ovale: Secondary | ICD-10-CM

## 2020-12-16 DIAGNOSIS — Z7689 Persons encountering health services in other specified circumstances: Secondary | ICD-10-CM

## 2020-12-16 DIAGNOSIS — G43009 Migraine without aura, not intractable, without status migrainosus: Secondary | ICD-10-CM

## 2020-12-16 MED ORDER — SERTRALINE HCL 25 MG PO TABS: 25.0000 mg | ORAL_TABLET | Freq: Every day | ORAL | 1 refills | Status: DC

## 2020-12-16 MED ORDER — HYDROXYZINE PAMOATE 25 MG PO CAPS: 25.0000 mg | ORAL_CAPSULE | Freq: Three times a day (TID) | ORAL | 0 refills | Status: DC | PRN

## 2020-12-16 NOTE — Assessment & Plan Note (Signed)
Chronic, not controlled. Was going to therapy several years ago. Her GAD 7 is 21 and PHQ 9 is 15. She endorses worsening anxiety and crying spells. Denies SI/HI. Will start zoloft 25 mg daily. Discussed exercise and non-pharmacologic measures for anxiety. Follow up in 4-6 weeks.

## 2020-12-16 NOTE — Assessment & Plan Note (Signed)
History of 2 strokes in 10/2020. Continue plavix, aspirin, and lipitor. Recently had PFO closure. Upon reviewing notes from neurologist, most likely caused from birth control (she was taking depo provera injections). Will avoid all hormonal methods of birth control. Keep regular scheduled visits with neurology and cardiology.

## 2020-12-16 NOTE — Assessment & Plan Note (Signed)
Recently closed by Dr. Jacinto Halim. Still with bruising and pain to right groin. Continue follow up and recommendations from cardiology.

## 2020-12-16 NOTE — Assessment & Plan Note (Addendum)
Chronic, not controlled. Was going to therapy several years ago. Her GAD 7 is 21 and PHQ 9 is 15. She endorses worsening anxiety and crying spells. Denies SI/HI. Will start zoloft 25 mg daily. Discussed exercise and non-pharmacologic measures for anxiety. Follow up in 4-6 weeks.

## 2020-12-16 NOTE — Telephone Encounter (Signed)
Called and spoke with patient on the phone. It is ok for her to take zoloft and aspirin together. Will start her on hydroxyzine to help with acute anxiety attacks. Discussed avoiding xanax as per side effects and addiction potential. Keep next scheduled appointment in 4-6 weeks.

## 2020-12-16 NOTE — Assessment & Plan Note (Signed)
Chronic, controlled with topamax. Continue current regimen. Follow up with any concerns.

## 2020-12-16 NOTE — Assessment & Plan Note (Signed)
Actively working with physical therapy. Has seen significant improvement since being discharged from the hospital. Continues to have some trouble with left foot and mild weakness in left hand.

## 2020-12-17 ENCOUNTER — Ambulatory Visit: Payer: Managed Care, Other (non HMO)

## 2020-12-17 ENCOUNTER — Ambulatory Visit: Payer: Managed Care, Other (non HMO) | Admitting: Occupational Therapy

## 2020-12-17 DIAGNOSIS — R278 Other lack of coordination: Secondary | ICD-10-CM

## 2020-12-17 DIAGNOSIS — R269 Unspecified abnormalities of gait and mobility: Secondary | ICD-10-CM | POA: Diagnosis not present

## 2020-12-17 DIAGNOSIS — R2689 Other abnormalities of gait and mobility: Secondary | ICD-10-CM

## 2020-12-17 DIAGNOSIS — M6281 Muscle weakness (generalized): Secondary | ICD-10-CM

## 2020-12-17 DIAGNOSIS — R2681 Unsteadiness on feet: Secondary | ICD-10-CM

## 2020-12-17 NOTE — Therapy (Signed)
[place] [facility] MAIN [facility] SERVICES [address] [place], [place], [address] Phone: [phone]   Fax:  [phone]  Physical Therapy Treatment  Patient Details  Name: Kristen Grant MRN: [id number] Date of Birth: March 24, 1995 Referring Provider (PT): Mariam Dollar   Encounter Date: 12/17/2020   PT End of Session - 12/17/20 1212     Visit Number 12    Number of Visits 25    Date for PT Re-Evaluation 01/20/21    Authorization Type [employer] Managed    Authorization Time Period 10/28/20-01/20/21; PN on 12/05/2020    Progress Note Due on Visit 10    PT Start Time 1145    PT Stop Time 1217    PT Time Calculation (min) 32 min    Equipment Utilized During Treatment Gait belt    Activity Tolerance Patient tolerated treatment well;Patient limited by fatigue    Behavior During Therapy WFL for tasks assessed/performed             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   Stroke [facility]     Past Surgical History:  Procedure Laterality Date   BUBBLE STUDY  10/17/2020   Procedure: BUBBLE STUDY;  Surgeon: Jake Bathe, MD;  Location: [facility] ENDOSCOPY;  Service: Cardiovascular;;   IR ANGIO INTRA EXTRACRAN SEL COM CAROTID INNOMINATE UNI L MOD SED  10/11/2020   IR ANGIO VERTEBRAL SEL VERTEBRAL UNI L MOD SED  10/11/2020   IR CT HEAD LTD  10/11/2020   IR CT HEAD LTD  10/12/2020   IR PERCUTANEOUS ART THROMBECTOMY/INFUSION INTRACRANIAL INC DIAG ANGIO  10/11/2020   IR PERCUTANEOUS ART THROMBECTOMY/INFUSION INTRACRANIAL INC DIAG ANGIO  10/12/2020   NO PAST SURGERIES     PATENT FORAMEN OVALE(PFO) CLOSURE N/A 12/10/2020   Procedure: PATENT FORAMEN OVALE (PFO) CLOSURE;  Surgeon: Yates Decamp, MD;  [facility] INVASIVE CV LAB;  Service: Cardiovascular;  Laterality: N/A;   RADIOLOGY WITH  ANESTHESIA N/A 10/12/2020   Procedure: RADIOLOGY WITH ANESTHESIA;  Surgeon: Radiologist, Medication, MD;  Location: [facility] OR;  Service: Radiology;  Laterality: N/A;   RADIOLOGY WITH ANESTHESIA N/A 10/11/2020   Procedure: IR WITH ANESTHESIA;  Surgeon: Radiologist, Medication, MD;  Location: [facility] OR;  Service: Radiology;  Laterality: N/A;   TEE WITHOUT CARDIOVERSION N/A 10/17/2020   Procedure: TRANSESOPHAGEAL ECHOCARDIOGRAM (TEE);  Surgeon: Jake Bathe, MD;  Location: [facility] ENDOSCOPY;  Service: Cardiovascular;  Laterality: N/A;    There were no vitals filed for this visit.   Subjective Assessment - 12/17/20 1143     Subjective Pt presents after PFO closure. She reports her restrictions are no lifting over 10 lbs for a week and protecting incision site from water.    Patient is accompained by: Family member    Pertinent History Romayne L Fronek is a 25 year old right-handed female with morbid obesity BMI 29.92, anxiety, migraine headaches, tobacco use.  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  Cranial CT scan showed hypodensity in the right anterior temporal lobe concerning for infarct.  Pertaining to patient's right MCA infarct predominantly right caudate putamen anterior limb right internal capsule secondary to right M1 occlusion  status post revascularization mechanical thrombectomy requiring repeat thrombectomy 10/12/2020 for occluded dominant superior division of right MCA.    Limitations Walking;House hold activities    How long can you sit comfortably? no limitations    How long can you stand comfortably? 30 min    How long can you walk comfortably? 20 min    Patient Stated Goals To improve my balance and endurance back to my previous level of working full time and taking care of 5 year son    Currently in Pain? Yes    Pain Location Hip     Pain Orientation Right   incision site   Pain Onset In the past 7 days              INTERVENTIONS - CGA throughout unless otherwise noted  Neuro SLB 2x30 sec each LE, initially intermittent UE support used on balance bar. Pt reports some R hip pain.   Hedgehog tap exercise to promote DF with gait 10x each LE  Heel walk to promote LLE DF x several minutes, pt reports fatigue  Seated DF 3x15 with 2 sec hold at end range   Standing at support surface in // bars with SBA-CGA Semi-tandem, EC 30 sec each LE; pt rates as easy --progressed to with horizontal head turns x multiple reps; intermittent UE support Tandem stance EC ; intermittent UE support 2x30 sec each LE  Tandem stance EO with horizontal head turns; intermittent UE support Tandem stance EO with vertical head turns; intermittent UE support  TherEx Treadmill endurance training (reduced intensity today secondary to recent procedure), up to 1.5 mph, HR reached max of 103 bpm, x 5 min; pt response to exercises monitored throughout   Pt educated throughout session about proper posture and technique with exercises. Improved exercise technique, movement at target joints, use of target muscles after min to mod verbal, visual, tactile cues.     PT Education - 12/17/20 1211     Education Details exercise technique, body mechanics    Person(s) Educated Patient    Methods Explanation;Verbal cues;Demonstration    Comprehension Verbalized understanding;Returned demonstration              PT Short Term Goals - 12/05/20 1332       PT SHORT TERM GOAL #1   Title Pt will be independent with HEP in order to improve strength and balance in order to decrease fall risk and improve function at home and work.    Baseline 10/28/2020- Patient has no formal HEP in place; 12/05/2020=Patient verbalized understanding of balance/walking HEP and states no problem or difficulty performing at this time    Time 6    Period Weeks    Status  Achieved    Target Date 12/09/20               PT Long Term Goals - 12/05/20 1332       PT LONG TERM GOAL #1   Title Pt will improve FOTO to target score of 75 to  display perceived improvements in ability to complete ADL's.    Baseline 10/28/2020= 71; 12/05/2020= 74    Time 12    Period Weeks    Status On-going    Target Date 01/20/21      PT LONG TERM GOAL #2   Title Pt will decrease 5TSTS by at least 3 seconds in order to demonstrate clinically significant improvement in LE strength    Baseline 10/28/2020= 14.61 sec without UE support; 12/05/2020= 13.60 sec  without UE support    Time 12    Period Weeks    Status On-going      PT LONG TERM GOAL #3   Title Patient will increase Functional Gait Assessment score to >26/30 as to reduce fall risk and improve dynamic gait safety with community ambulation.    Baseline 10/28/2020= 23/30; 12/05/2020= 30/30    Time 12    Period Weeks    Status Achieved                   Plan - 12/17/20 1212     Clinical Impression Statement Session somewhat limited secondary to pt fatigue and restrictions following recent PFO closure procedure. Pt most challenged with today with LLE heelstrike/DF interventions and compliant surface exercises involving head-turns. The pt will benefit from further skilled PT to improve LE strength, endurance, and balance to return pt to PLOF.    Personal Factors and Comorbidities Comorbidity 1    Comorbidities CVA    Examination-Activity Limitations Caring for Others;Stairs    Examination-Participation Restrictions Community Activity;Occupation;Yard Work    Stability/Clinical Decision Making Stable/Uncomplicated    Rehab Potential Good    PT Frequency 2x / week    PT Duration 12 weeks    PT Treatment/Interventions ADLs/Self Care Home Management;Cryotherapy;Moist Heat;DME Instruction;Gait training;Stair training;Functional mobility training;Therapeutic activities;Therapeutic exercise;Balance  training;Neuromuscular re-education;Patient/family education;Manual techniques;Passive range of motion;Dry needling    PT Next Visit Plan Heavy focus needed on isolated hip flexion and ankle DF strengthening (persistent deficit); LLE fine motor control training;    PT Home Exercise Plan 10/31/2020: Access Code: [email or web address]  (balance exercises); no updates    Consulted and Agree with Plan of Care Patient             Patient will benefit from skilled therapeutic intervention in order to improve the following deficits and impairments:  Abnormal gait, Decreased activity tolerance, Decreased balance, Decreased coordination, Decreased endurance, Cardiopulmonary status limiting activity, Decreased mobility, Decreased strength, Difficulty walking, Impaired UE functional use  Visit Diagnosis: Other abnormalities of gait and mobility  Unsteadiness on feet  Muscle weakness (generalized)     Problem List Patient Active Problem List   Diagnosis Date Noted   History of stroke 12/16/2020   Depression, major, single episode, moderate (HCC) 12/16/2020   PFO (patent foramen ovale) 12/09/2020   Anemia    Hemiparesis affecting left side as late effect of stroke (HCC)    Leukocytosis    Dyslipidemia    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 [place], [place], [address] Phone: [phone]   Fax:  [phone]  Name: NATANIA FINIGAN MRN: [id number] Date of Birth: [birth year]

## 2020-12-19 ENCOUNTER — Ambulatory Visit: Payer: Managed Care, Other (non HMO)

## 2020-12-19 ENCOUNTER — Encounter: Payer: Self-pay | Admitting: Cardiology

## 2020-12-19 ENCOUNTER — Telehealth: Payer: Self-pay | Admitting: Cardiology

## 2020-12-19 ENCOUNTER — Ambulatory Visit: Payer: Managed Care, Other (non HMO) | Admitting: Occupational Therapy

## 2020-12-19 NOTE — Telephone Encounter (Signed)
I sent her message on MyChart inbox and she can print that letter

## 2020-12-19 NOTE — Telephone Encounter (Signed)
I have sent her return to work authorization letter to her inbox on MyChart.

## 2020-12-19 NOTE — Telephone Encounter (Signed)
Patient requesting letter to allow her to return to work. She says she will pick it up today if it can be ready today. Can call her at [phone].

## 2020-12-19 NOTE — Telephone Encounter (Signed)
From patient.

## 2020-12-20 NOTE — Telephone Encounter (Signed)
Pt called back, Pt is aware

## 2020-12-20 NOTE — Telephone Encounter (Signed)
Called pt, no answer. Left vm requesting call back?

## 2020-12-20 NOTE — Therapy (Signed)
[facility] [facility] MAIN [facility] SERVICES [address] [place], [place], [address] Phone: [phone]   Fax:  [phone]  Occupational Therapy Treatment  Patient Details  Name: Kristen Grant MRN: [id number] Date of Birth: 1995-09-24 Referring Provider (OT): Mariam Dollar, [place]   Encounter Date: 12/17/2020   OT End of Session - 12/20/20 1809     Visit Number 13    Number of Visits 24    Date for OT Re-Evaluation 01/19/21    Authorization Type Progress report period starting 11/27/2020    OT Start Time 1045    OT Stop Time 1130    OT Time Calculation (min) 45 min    Activity Tolerance Patient tolerated treatment well    Behavior During Therapy [facility] for tasks assessed/performed             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   Stroke [facility])     Past Surgical History:  Procedure Laterality Date   BUBBLE STUDY  10/17/2020   Procedure: BUBBLE STUDY;  Surgeon: Jake Bathe, MD;  [facility]OSCOPY;  Service: Cardiovascular;;   IR ANGIO INTRA EXTRACRAN SEL COM CAROTID INNOMINATE UNI L MOD SED  10/11/2020   IR ANGIO VERTEBRAL SEL VERTEBRAL UNI L MOD SED  10/11/2020   IR CT HEAD LTD  10/11/2020   IR CT HEAD LTD  10/12/2020   IR PERCUTANEOUS ART THROMBECTOMY/INFUSION INTRACRANIAL INC DIAG ANGIO  10/11/2020   IR PERCUTANEOUS ART THROMBECTOMY/INFUSION INTRACRANIAL INC DIAG ANGIO  10/12/2020   NO PAST SURGERIES     PATENT FORAMEN OVALE(PFO) CLOSURE N/A 12/10/2020   Procedure: PATENT FORAMEN OVALE (PFO) CLOSURE;  Surgeon: Yates Decamp, MD;  [facility] INVASIVE CV LAB;  Service: Cardiovascular;  Laterality: N/A;   RADIOLOGY WITH ANESTHESIA N/A 10/12/2020   Procedure: RADIOLOGY WITH ANESTHESIA;  Surgeon: Radiologist, Medication, MD;  Location: [facility] OR;  Service: Radiology;   Laterality: N/A;   RADIOLOGY WITH ANESTHESIA N/A 10/11/2020   Procedure: IR WITH ANESTHESIA;  Surgeon: Radiologist, Medication, MD;  Location: [facility] OR;  Service: Radiology;  Laterality: N/A;   TEE WITHOUT CARDIOVERSION N/A 10/17/2020   Procedure: TRANSESOPHAGEAL ECHOCARDIOGRAM (TEE);  Surgeon: Jake Bathe, MD;  Location: [facility] ENDOSCOPY;  Service: Cardiovascular;  Laterality: N/A;    There were no vitals filed for this visit.   Subjective Assessment - 12/20/20 1808     Subjective  Pt reports she is doing well, has been working on strength and coordination for her left hand.  She reports she still drops items from left hand while walking but getting better with this over time.    Pertinent History Recent R MCA infarct on 10/11/20, underwent  mechanical thrombectomy on 10/11/20, a second time on 10/12/20.    Limitations L sided hemiparesis, decreased balance, coordination, decreased indep with ADLs/IADLs/work/leisure    Currently in Pain? No/denies    Pain Score 0-No pain    Pain Onset In the past 7 days            Neuromuscular Reeducation: Pt seen for functional hand skills with manipulation of playing cards, difficulty with task of shuffling and unable to complete with more complex shuffling.  Focus on thumb and finger combinations to turn and flip cards.  Thumb on  top of card when flipping towards her, thumb on top when flipping away.  Pt instructed on variations of task to flip and sort, speed of flipping and alternating thumb and finger combinations with each flip. Pt engaged in unknotting of medium nylon rope with use of bilateral hands, cues for use of left hand as lead of task to unknot.  Able to complete with increased time and minimal difficulty.  Manipulation of small glass beads, picking up from flat surface, some of the beads with flat side facing up, others with flat side down.  Instructed on use of translatory skills of the hand to move items from palm and then using hand for storage.   Dropping items out of ulnar side of the hand at times with attempts to use hand for storage. Therapeutic Exercise: 2# dowel for UB strengthening for shoulder flexion, ABD, ADD, forwards circles, backwards circles and chest press for 10 reps each for 1 set.  Cues for proper form and technique.   Response to tx: Pt. progressing well with coordination and manipulation tasks, still slower with overall speed of manipulation and with more complex manipulation skills such as translatory skills and use of the hand for storage.  Pt responds well to cues and instruction during session.  Pt continues to benefit from skilled OT services to maximize safety and independence in necessary daily tasks.                        OT Education - 12/20/20 1809     Education Details coordination skills    Person(s) Educated Patient    Methods Explanation;Verbal cues    Comprehension Verbalized understanding;Verbal cues required;Need further instruction              OT Short Term Goals - 10/29/20 1247       OT SHORT TERM GOAL #1   Title Pt will perform HEP with min vc for improving LUE strength and coordination    Baseline Eval: initiated theraputty HEP at eval; further training needed    Time 6    Period Weeks    Status New    Target Date 12/08/20               OT Long Term Goals - 11/27/20 1622       OT LONG TERM GOAL #1   Title Pt will improve FOTO score to indicate improved functional performance.    Baseline 10th visit:  FOTO score: 77 Eval: incomplete FOTO, will complete next session    Time 12    Period Weeks    Status On-going    Target Date 01/19/21      OT LONG TERM GOAL #2   Title Pt will improve LUE strength by to enable pt to hold 25 y/o son and lift/carry heavy supplies for work and ADLs.    Baseline 1)th visit: UE strength is improving. Pt. is able to lift her son out of a shopping cart, however is unable to hold him for a duration.  Eval: Pt can not lift  son (35-40 lbs).  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    Time 12    Period Weeks    Status On-going    Target Date 01/19/21      OT LONG TERM GOAL #3   Title Pt will improve LUE GMC/FMC skills to enable her to braid her own hair.    Baseline  10th visit: Pt. has not tried due to cutting her hair, however would like to keep this goal. Eval: Dependent to braid hair    Time 12    Period Weeks    Status On-going    Target Date 01/19/21      OT LONG TERM GOAL #4   Title Pt will improve L grip and pinch strength to enable her to open bottles and containers with modified indep.    Baseline 10th visit: Left grip strength: 25 lbs, lateral pInch strength: 11#, 3pt. pinch: 8#  pt. is able to open bottles. Eval: Requires assist to open bottles and containers (L grip 20 lbs, R 56; L lateral pinch 10 lbs, R 16, L 3 point pinch 6 lbs, R 12.    Time 12    Period Weeks    Status On-going    Target Date 01/19/21      OT LONG TERM GOAL #5   Title Pt will improve BUE coordination to enable indep sweeping, making beds, and other light home making tasks.    Baseline 10th visit: Pt. is able to perfrom laundry, and dishes. Pt. requires assist for bedmaking tasks. Eval: assist needed with IADL tasks which require use of 2 hands.    Time 12    Period Weeks    Status On-going                   Plan - 12/20/20 1809     Clinical Impression Statement Pt. progressing well with coordination and manipulation tasks, still slower with overall speed of manipulation and with more complex manipulation skills such as translatory skills and use of the hand for storage.  Pt responds well to cues and instruction during session.  Pt continues to benefit from skilled OT services to maximize safety and independence in necessary daily tasks.    OT Occupational Profile and History Detailed Assessment- Review of Records and additional review of physical, cognitive, psychosocial history  related to current functional performance    Occupational performance deficits (Please refer to evaluation for details): ADL's;IADL's;Leisure;Work    Games developer / Function / Physical Skills ADL;Coordination;Endurance;UE functional use;Balance;Body mechanics;IADL;Dexterity;FMC;Strength;Mobility;ROM    Rehab Potential Excellent    Clinical Decision Making Several treatment options, min-mod task modification necessary    Comorbidities Affecting Occupational Performance: May have comorbidities impacting occupational performance    Modification or Assistance to Complete Evaluation  Min-Moderate modification of tasks or assist with assess necessary to complete eval    OT Frequency 2x / week    OT Duration 12 weeks    OT Treatment/Interventions Self-care/ADL training;Therapeutic exercise;DME and/or AE instruction;Neuromuscular education;Manual Therapy;Passive range of motion;Therapeutic activities;Patient/family education    OT Home Exercise Plan Theraputty HEP issued at eval    Consulted and Agree with Plan of Care Patient;Family member/caregiver             Patient will benefit from skilled therapeutic intervention in order to improve the following deficits and impairments:   Body Structure / Function / Physical Skills: ADL, Coordination, Endurance, UE functional use, Balance, Body mechanics, IADL, Dexterity, FMC, Strength, Mobility, ROM       Visit Diagnosis: Other lack of coordination  Muscle weakness (generalized)    Problem List Patient Active Problem List   Diagnosis Date Noted   History of stroke 12/16/2020   Depression, major, single episode, moderate (HCC) 12/16/2020   PFO (patent foramen ovale) 12/09/2020   Anemia    Hemiparesis affecting left side as late effect of  stroke (HCC)    Leukocytosis    Dyslipidemia    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

## 2020-12-23 ENCOUNTER — Ambulatory Visit: Payer: Managed Care, Other (non HMO)

## 2020-12-23 ENCOUNTER — Ambulatory Visit: Payer: Managed Care, Other (non HMO) | Admitting: Occupational Therapy

## 2020-12-25 ENCOUNTER — Other Ambulatory Visit: Payer: Self-pay

## 2020-12-25 ENCOUNTER — Encounter: Payer: Self-pay | Admitting: Occupational Therapy

## 2020-12-25 ENCOUNTER — Ambulatory Visit: Payer: Managed Care, Other (non HMO) | Admitting: Occupational Therapy

## 2020-12-25 ENCOUNTER — Ambulatory Visit: Payer: Managed Care, Other (non HMO)

## 2020-12-25 DIAGNOSIS — M6281 Muscle weakness (generalized): Secondary | ICD-10-CM

## 2020-12-25 DIAGNOSIS — R278 Other lack of coordination: Secondary | ICD-10-CM

## 2020-12-25 DIAGNOSIS — R2689 Other abnormalities of gait and mobility: Secondary | ICD-10-CM

## 2020-12-25 DIAGNOSIS — R2681 Unsteadiness on feet: Secondary | ICD-10-CM

## 2020-12-25 DIAGNOSIS — R269 Unspecified abnormalities of gait and mobility: Secondary | ICD-10-CM | POA: Diagnosis not present

## 2020-12-25 NOTE — Therapy (Signed)
[facility] MAIN [facility] SERVICES [address] [place], [place], [address] Phone: [phone]   Fax:  [phone]  Occupational Therapy Treatment  Patient Details  Name: SARAJANE FAMBROUGH MRN: [id number] Date of Birth: Jun 24, 1995 Referring Provider (OT): Mariam Dollar, [place]   Encounter Date: 12/25/2020   OT End of Session - 12/25/20 1151     Visit Number 14    Number of Visits 24    Date for OT Re-Evaluation 01/19/21    Authorization Type Progress report period starting 11/27/2020    OT Start Time 1145    OT Stop Time 1230    OT Time Calculation (min) 45 min    Activity Tolerance Patient tolerated treatment well    Behavior During Therapy [facility] for tasks assessed/performed             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   Stroke [facility])     Past Surgical History:  Procedure Laterality Date   BUBBLE STUDY  10/17/2020   Procedure: BUBBLE STUDY;  Surgeon: Jake Bathe, MD;  [facility]: [facility] ENDOSCOPY;  Service: Cardiovascular;;   IR ANGIO INTRA EXTRACRAN SEL COM CAROTID INNOMINATE UNI L MOD SED  10/11/2020   IR ANGIO VERTEBRAL SEL VERTEBRAL UNI L MOD SED  10/11/2020   IR CT HEAD LTD  10/11/2020   IR CT HEAD LTD  10/12/2020   IR PERCUTANEOUS ART THROMBECTOMY/INFUSION INTRACRANIAL INC DIAG ANGIO  10/11/2020   IR PERCUTANEOUS ART THROMBECTOMY/INFUSION INTRACRANIAL INC DIAG ANGIO  10/12/2020   NO PAST SURGERIES     PATENT FORAMEN OVALE(PFO) CLOSURE N/A 12/10/2020   Procedure: PATENT FORAMEN OVALE (PFO) CLOSURE;  Surgeon: Yates Decamp, MD;  [facility] INVASIVE CV LAB;  Service: Cardiovascular;  Laterality: N/A;   RADIOLOGY WITH ANESTHESIA N/A 10/12/2020   Procedure: RADIOLOGY WITH ANESTHESIA;  Surgeon: Radiologist, Medication, MD;  Location: [facility] OR;  Service: Radiology;   Laterality: N/A;   RADIOLOGY WITH ANESTHESIA N/A 10/11/2020   Procedure: IR WITH ANESTHESIA;  Surgeon: Radiologist, Medication, MD;  Location: [facility] OR;  Service: Radiology;  Laterality: N/A;   TEE WITHOUT CARDIOVERSION N/A 10/17/2020   Procedure: TRANSESOPHAGEAL ECHOCARDIOGRAM (TEE);  Surgeon: Jake Bathe, MD;  Location: [facility] ENDOSCOPY;  Service: Cardiovascular;  Laterality: N/A;    There were no vitals filed for this visit.   Subjective Assessment - 12/25/20 1150     Subjective  Pt. reports  that she has returned to work yesterday.    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    Limitations L sided hemiparesis, decreased balance, coordination, decreased indep with ADLs/IADLs/work/leisure    Currently in Pain? No/denies            OT TREATMENT    Neuro muscular re-education:  Pt. worked on grasping coins from a tabletop surface, placing them into a resistive container, and pushing them through the slot while isolating his 2nd digit.  Pt. worked on grasping, and storing the coins.   Selfcare:  Pt. worked on sorting, and counting money using simulated paper money. Pt. worked on sorting, and increasing speed with counting money in preparation for being able to  count money fast for work related skills.   Pt. reports that she has returned to work yesterday. Pt. is starting out part time, and plans to progressively increase her hours. 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 Pt was able to move through the simulated paper money when counting, and sorting the stack without fatigue. Pt. Worked towards progressively increasing the speed during then task. Pt. continues to work on improving UE strength, and [facility] skills in order to work towards improving LUE functioning, and maximizing independence with ADLs, and IADL tasks.                          OT Education - 12/25/20 1151     Education Details coordination skills    Person(s) Educated Patient    Methods Explanation;Verbal cues    Comprehension Verbalized understanding;Verbal cues required;Need further instruction              OT Short Term Goals - 10/29/20 1247       OT SHORT TERM GOAL #1   Title Pt will perform HEP with min vc for improving LUE strength and coordination    Baseline Eval: initiated theraputty HEP at eval; further training needed    Time 6    Period Weeks    Status New    Target Date 12/08/20               OT Long Term Goals - 11/27/20 1622       OT LONG TERM GOAL #1   Title Pt will improve FOTO score to indicate improved functional performance.    Baseline 10th visit:  FOTO score: 77 Eval: incomplete FOTO, will complete next session    Time 12    Period Weeks    Status On-going    Target Date 01/19/21      OT LONG TERM GOAL #2   Title Pt will improve LUE strength by to enable pt to hold 5 y/o son and lift/carry heavy supplies for work and ADLs.    Baseline 1)th visit: UE strength is improving. Pt. is able to lift her son out of a shopping cart, however is unable to hold him for a duration.  Eval: Pt can not lift son (35-40 lbs).  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    Time 12    Period Weeks    Status On-going    Target Date 01/19/21      OT LONG TERM GOAL #3   Title Pt will improve LUE GMC/FMC skills to enable her to braid her own hair.    Baseline 10th visit: Pt. has not tried due to cutting her hair, however would like to keep this goal. Eval: Dependent to braid hair    Time 12    Period Weeks    Status On-going    Target Date 01/19/21      OT LONG TERM GOAL #4   Title Pt will improve L grip and pinch strength to enable her to open bottles and containers with modified indep.    Baseline 10th visit: Left grip strength: 25 lbs, lateral pInch  strength: 11#, 3pt. pinch: 8#  pt. is able to open bottles. Eval: Requires assist to open bottles and containers (L grip 20 lbs, R 56; L lateral pinch 10 lbs, R 16, L 3 point pinch 6 lbs, R 12.  Time 12    Period Weeks    Status On-going    Target Date 01/19/21      OT LONG TERM GOAL #5   Title Pt will improve BUE coordination to enable indep sweeping, making beds, and other light home making tasks.    Baseline 10th visit: Pt. is able to perfrom laundry, and dishes. Pt. requires assist for bedmaking tasks. Eval: assist needed with IADL tasks which require use of 2 hands.    Time 12    Period Weeks    Status On-going                   Plan - 12/25/20 1156     Clinical Impression Statement Pt. reports that she has returned to work yesterday. Pt. is starting out part time, and plans to progressively increase her hours. 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 Pt was able to move through the simulated paper money when counting, and sorting the stack without fatigue. Pt. Worked towards progressively increasing the speed during then task. Pt. continues to work on improving UE strength, and [facility] skills in order to work towards improving LUE functioning, and maximizing independence with ADLs, and IADL tasks.   OT Occupational Profile and History Detailed Assessment- Review of Records and additional review of physical, cognitive, psychosocial history related to current functional performance    Occupational performance deficits (Please refer to evaluation for details): ADL's;IADL's;Leisure;Work    Games developer / Function / Physical Skills ADL;Coordination;Endurance;UE functional use;Balance;Body mechanics;IADL;Dexterity;FMC;Strength;Mobility;ROM    Rehab Potential Excellent    Clinical Decision Making Several treatment options, min-mod task modification necessary    Comorbidities Affecting Occupational Performance: May have comorbidities impacting  occupational performance    Modification or Assistance to Complete Evaluation  Min-Moderate modification of tasks or assist with assess necessary to complete eval    OT Frequency 2x / week    OT Duration 12 weeks    OT Treatment/Interventions Self-care/ADL training;Therapeutic exercise;DME and/or AE instruction;Neuromuscular education;Manual Therapy;Passive range of motion;Therapeutic activities;Patient/family education    OT Home Exercise Plan Theraputty HEP issued at eval    Consulted and Agree with Plan of Care Patient;Family member/caregiver             Patient will benefit from skilled therapeutic intervention in order to improve the following deficits and impairments:   Body Structure / Function / Physical Skills: ADL, Coordination, Endurance, UE functional use, Balance, Body mechanics, IADL, Dexterity, FMC, Strength, Mobility, ROM       Visit Diagnosis: Muscle weakness (generalized)  Other lack of coordination    Problem List Patient Active Problem List   Diagnosis Date Noted   History of stroke 12/16/2020   Depression, major, single episode, moderate (HCC) 12/16/2020   PFO (patent foramen ovale) 12/09/2020   Anemia    Hemiparesis affecting left side as late effect of stroke (HCC)    Leukocytosis    Dyslipidemia    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

## 2020-12-25 NOTE — Therapy (Signed)
[facility] [place] [facility] MAIN [facility] SERVICES [address] [place], [place], [address] Phone: [phone]   Fax:  [phone]  Physical Therapy Treatment  Patient Details  Name: Kristen Grant MRN: [id number] Date of Birth: 15-Mar-1995 Referring Provider (PT): Mariam Dollar   Encounter Date: 12/25/2020   PT End of Session - 12/25/20 1810     Visit Number 13    Number of Visits 25    Date for PT Re-Evaluation 01/20/21    Authorization Type [employer] Man[employer]    Authorization Time Period 10/28/20-01/20/21; PN on 12/05/2020    Progress Note Due on Visit 10    PT Start Time 1103    PT Stop Time 1143    PT Time Calculation (min) 40 min    Equipment Utilized During Treatment Gait belt    Activity Tolerance Patient tolerated treatment well;Patient limited by fatigue    Behavior During Therapy WFL for tasks assessed/performed             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   Stroke [facility])     Past Surgical History:  Procedure Laterality Date   BUBBLE STUDY  10/17/2020   Procedure: BUBBLE STUDY;  Surgeon: Jake Bathe, MD;  [facility] ENDOSCOPY;  Service: Cardiovascular;;   IR ANGIO INTRA EXTRACRAN SEL COM CAROTID INNOMINATE UNI L MOD SED  10/11/2020   IR ANGIO VERTEBRAL SEL VERTEBRAL UNI L MOD SED  10/11/2020   IR CT HEAD LTD  10/11/2020   IR CT HEAD LTD  10/12/2020   IR PERCUTANEOUS ART THROMBECTOMY/INFUSION INTRACRANIAL INC DIAG ANGIO  10/11/2020   IR PERCUTANEOUS ART THROMBECTOMY/INFUSION INTRACRANIAL INC DIAG ANGIO  10/12/2020   NO PAST SURGERIES     PATENT FORAMEN OVALE(PFO) CLOSURE N/A 12/10/2020   Procedure: PATENT FORAMEN OVALE (PFO) CLOSURE;  Surgeon: Yates Decamp, MD;  [facility] INVASIVE CV LAB;  Service: Cardiovascular;  Laterality: N/A;   RADIOLOGY WITH  ANESTHESIA N/A 10/12/2020   Procedure: RADIOLOGY WITH ANESTHESIA;  Surgeon: Radiologist, Medication, MD;  Location: [facility] OR;  Service: Radiology;  Laterality: N/A;   RADIOLOGY WITH ANESTHESIA N/A 10/11/2020   Procedure: IR WITH ANESTHESIA;  Surgeon: Radiologist, Medication, MD;  Location: [facility] OR;  Service: Radiology;  Laterality: N/A;   TEE WITHOUT CARDIOVERSION N/A 10/17/2020   Procedure: TRANSESOPHAGEAL ECHOCARDIOGRAM (TEE);  Surgeon: Jake Bathe, MD;  Location: [facility] ENDOSCOPY;  Service: Cardiovascular;  Laterality: N/A;    There were no vitals filed for this visit.   Subjective Assessment - 12/25/20 1106     Subjective Pt reports she has returned to work. She reports it went well overall but that she did have some back pain because she has to bend a lot for her job.    Patient is accompained by: Family member    Pertinent History Imanie L Beamon is a 25 year old right-handed female with morbid obesity BMI 29.92, anxiety, migraine headaches, tobacco use.  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  Cranial CT scan showed hypodensity in the right anterior temporal lobe concerning for infarct.  Pertaining to patient's right MCA infarct predominantly right caudate putamen anterior limb right  internal capsule secondary to right M1 occlusion status post revascularization mechanical thrombectomy requiring repeat thrombectomy 10/12/2020 for occluded dominant superior division of right MCA.    Limitations Walking;House hold activities    How long can you sit comfortably? no limitations    How long can you stand comfortably? 30 min    How long can you walk comfortably? 20 min    Patient Stated Goals To improve my balance and endurance back to my previous level of working full time and taking care of 5 year son    Currently in Pain? No/denies     Pain Onset In the past 7 days               INTERVENTIONS - CGA throughout unless otherwise noted   Deadlift/lifting technique from increased height - 10 reps of lifting 2# dumbbells; PT provides frequent multi-modal cues to improve pt technique. Pt does show some improvements after cuing, will need further instruction.    Treadmill for LE muscular and cardioresp endurance - 6.5 minutes up to 1.3 mph, exhibits L foot scuff at about 4 min and reports feeling out of breath. PT provides close CGA throughout.   Standing penguin walks for DF - forward/backward and side to side x multiple reps; pt rates difficult on LLE but otherwise a medium. Pt states, "it was really hard to keep my L foot up."  Ankle rocker board for DF/PF 2x20. Pt rates as easy  Standing single leg heel raise 2x15. Pt rates as challenging on LLE.  Neuro  Standing on airex pad with EC, NBOS 4x30 sec Standing on airex pad, EC, NBOS with vertical and horizontal head turns 2x10 for each in each direction; pt with intermittent UE support with vertical head turns. Pt then performed additional reps with vertical head turns.  Ambulation with vertical head turns over 10 meter track 4x, some variability in BOS, but no LOB.  On airex, tandem stance 2x30 sec each LE --progressed to two rounds of horizontal head turns for each position; intermittent UE support required  Star balance intervention 3x each LE, performed to promote dynamic SLB. Pt without LOB, exhibits good postural control.   Pt educated throughout session about proper posture and technique with exercises. Improved exercise technique, movement at target joints, use of target muscles after min to mod verbal, visual, tactile cues.     PT Education - 12/25/20 1810     Education Details exercise technique/lifting technique and body mechanics    Person(s) Educated Patient    Methods Explanation;Demonstration;Verbal cues    Comprehension Verbalized  understanding;Returned demonstration;Verbal cues required;Need further instruction              PT Short Term Goals - 12/05/20 1332       PT SHORT TERM GOAL #1   Title Pt will be independent with HEP in order to improve strength and balance in order to decrease fall risk and improve function at home and work.    Baseline 10/28/2020- Patient has no formal HEP in place; 12/05/2020=Patient verbalized understanding of balance/walking HEP and states no problem or difficulty performing at this time    Time 6    Period Weeks    Status Achieved    Target Date 12/09/20               PT Long Term Goals - 12/05/20 1332       PT LONG TERM GOAL #1   Title Pt will improve FOTO to target score of  75 to  display perceived improvements in ability to complete ADL's.    Baseline 10/28/2020= 71; 12/05/2020= 74    Time 12    Period Weeks    Status On-going    Target Date 01/20/21      PT LONG TERM GOAL #2   Title Pt will decrease 5TSTS by at least 3 seconds in order to demonstrate clinically significant improvement in LE strength    Baseline 10/28/2020= 14.61 sec without UE support; 12/05/2020= 13.60 sec without UE support    Time 12    Period Weeks    Status On-going      PT LONG TERM GOAL #3   Title Patient will increase Functional Gait Assessment score to >26/30 as to reduce fall risk and improve dynamic gait safety with community ambulation.    Baseline 10/28/2020= 23/30; 12/05/2020= 30/30    Time 12    Period Weeks    Status Achieved                   Plan - 12/25/20 1811     Clinical Impression Statement Pt highly motivated to participate in session and responds to all interventions well without reports of pain. However, pt continues to be most limited by quick onset of fatigue, particularly of LLE DFs. PT did spend part of session instructing pt in lifting technique as pt as now returned to work on light duty, and was reporting pain with bending and lifting grocery bags. The  pt will benefit from further skilled PT to continue to improve LE strength, endurance, and balance to return to PLOF.    Personal Factors and Comorbidities Comorbidity 1    Comorbidities CVA    Examination-Activity Limitations Caring for Others;Stairs    Examination-Participation Restrictions Community Activity;Occupation;Yard Work    Stability/Clinical Decision Making Stable/Uncomplicated    Rehab Potential Good    PT Frequency 2x / week    PT Duration 12 weeks    PT Treatment/Interventions ADLs/Self Care Home Management;Cryotherapy;Moist Heat;DME Instruction;Gait training;Stair training;Functional mobility training;Therapeutic activities;Therapeutic exercise;Balance training;Neuromuscular re-education;Patient/family education;Manual techniques;Passive range of motion;Dry needling    PT Next Visit Plan Heavy focus needed on isolated hip flexion and ankle DF strengthening (persistent deficit); LLE fine motor control training; DF strengthening on L, endurance    PT Home Exercise Plan 10/31/2020: Access Code: [email or web address]  (balance exercises); no updates    Consulted and Agree with Plan of Care Patient             Patient will benefit from skilled therapeutic intervention in order to improve the following deficits and impairments:  Abnormal gait, Decreased activity tolerance, Decreased balance, Decreased coordination, Decreased endurance, Cardiopulmonary status limiting activity, Decreased mobility, Decreased strength, Difficulty walking, Impaired UE functional use  Visit Diagnosis: Muscle weakness (generalized)  Other lack of coordination  Other abnormalities of gait and mobility  Unsteadiness on feet     Problem List Patient Active Problem List   Diagnosis Date Noted   History of stroke 12/16/2020   Depression, major, single episode, moderate (HCC) 12/16/2020   PFO (patent foramen ovale) 12/09/2020   Anemia    Hemiparesis affecting left side as late effect of stroke (HCC)     Leukocytosis    Dyslipidemia    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

## 2020-12-30 ENCOUNTER — Ambulatory Visit: Payer: Managed Care, Other (non HMO)

## 2020-12-31 ENCOUNTER — Other Ambulatory Visit: Payer: Self-pay

## 2020-12-31 ENCOUNTER — Encounter
Payer: Managed Care, Other (non HMO) | Attending: Physical Medicine & Rehabilitation | Admitting: Physical Medicine & Rehabilitation

## 2020-12-31 ENCOUNTER — Encounter: Payer: Self-pay | Admitting: Physical Medicine & Rehabilitation

## 2020-12-31 VITALS — BP 121/84 | HR 74 | Ht 67.5 in | Wt 202.0 lb

## 2020-12-31 DIAGNOSIS — I69354 Hemiplegia and hemiparesis following cerebral infarction affecting left non-dominant side: Secondary | ICD-10-CM | POA: Insufficient documentation

## 2020-12-31 NOTE — Progress Notes (Signed)
Subjective:    Patient ID: Kristen Grant, female    DOB: 07-Apr-1995, 25 y.o.   MRN: [id number] 25 y.o. right-handed female with history of morbid obesity BMI 29.92 migraine headaches tobacco use.  Per chart review independent prior to admission working full-time.  She lives with her boyfriend and 56-year-old son.  She has a mother with good support.  Presented 10/11/2020 with acute onset of left-sided weakness dysarthria and facial droop.  Cranial CT scan showed hypodensity in the right anterior temporal lobe concerning for infarction.  CT angiogram head and neck thrombus in the distal right M1 segment with near complete occlusion.  11 cc infarct core in the right corona radiata.  Admission chemistries unremarkable urine drug screen positive for marijuana as well as benzos.  Patient underwent mechanical thrombectomy 10/11/2020 with revascularization per interventional radiology requiring repeat thrombectomy 24 hours later for occluded dominant superior division of the right MCA achieving revascularization.  Latest MRI and imaging 10/13/2020 showed acute anterior right MCA territory infarct predominantly involving the right caudate putamen and anterior limb of the right internal capsule, insula, operculum and overlying right frontal lobe.  A few punctate acute infarcts in the right parietal lobe.  Associated cytotoxic edema with slight 2-3 mm leftward midline shift at the foramen of Monro.  Probable small amount of hemorrhage in the posterior right frontal lobe/insular region.  Echocardiogram with ejection fraction of 60 to 65% no wall motion abnormalities.  Hyper coag work-up negative.  TEE was pending.  Patient was cleared to begin aspirin for CVA prophylaxis.  Lower extremity Dopplers negative.  Tolerating a regular diet.  Therapy evaluations completed due to patient's left-sided weakness and dysarthria was admitted for a comprehensive rehab program. Admit date: 10/17/2020 Discharge date: 10/26/2020    HPI Patient returns today she is overall doing well she is modified independent with all self-care and mobility.  She is back to driving. 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 General activity 4 Relation with others 2 Enjoyment of life 5 What TIME of day is your pain at its worst? varies Sleep (in general) Fair  Pain is worse with: walking, bending, and standing Pain improves with: rest Relief from Meds:  na  Family History  Problem Relation Age of Onset   Hypertension Mother    Epilepsy Mother    Anxiety disorder Mother    Depression Mother    Migraines Mother    Diabetes Maternal Grandmother    Stroke Maternal Grandmother    Hypertension Maternal Grandmother    Asthma Half-Brother    Asthma Half-Sister    Breast cancer Neg Hx    Social History   Socioeconomic History   Marital status: Single    Spouse name: Not on file   Number of children: 1   Years of education: Not on file   Highest education level: GED or equivalent  Occupational History   Not on file  Tobacco Use   Smoking status: Former    Types: E-cigarettes    Start date: 2021   Smokeless tobacco: Never  Vaping Use   Vaping Use: Former   Substances: Nicotine, THC, CBD  Substance and Sexual Activity  Alcohol use: Not Currently   Drug use: Yes    Frequency: 3.0 times per week    Types: Marijuana    Comment: on occasion   Sexual activity: Not Currently    Partners: Male    Birth control/protection: Injection  Other Topics Concern   Not on file  Social History Narrative   Not on file   Social Determinants of Health   Financial Resource Strain: Not on file  Food Insecurity: Not on  file  Transportation Needs: Not on file  Physical Activity: Not on file  Stress: Not on file  Social Connections: Not on file   Past Surgical History:  Procedure Laterality Date   BUBBLE STUDY  10/17/2020   Procedure: BUBBLE STUDY;  Surgeon: Jake Bathe, MD;  Location: [facility];  Service: Cardiovascular;;   IR ANGIO INTRA EXTRACRAN SEL COM CAROTID INNOMINATE UNI L MOD SED  10/11/2020   IR ANGIO VERTEBRAL SEL VERTEBRAL UNI L MOD SED  10/11/2020   IR CT HEAD LTD  10/11/2020   IR CT HEAD LTD  10/12/2020   IR PERCUTANEOUS ART THROMBECTOMY/INFUSION INTRACRANIAL INC DIAG ANGIO  10/11/2020   IR PERCUTANEOUS ART THROMBECTOMY/INFUSION INTRACRANIAL INC DIAG ANGIO  10/12/2020   NO PAST SURGERIES     PATENT FORAMEN OVALE(PFO) CLOSURE N/A 12/10/2020   Procedure: PATENT FORAMEN OVALE (PFO) CLOSURE;  Surgeon: Yates Decamp, MD;  [facility]: [facility];  Service: Cardiovascular;  Laterality: N/A;   RADIOLOGY WITH ANESTHESIA N/A 10/12/2020   Procedure: RADIOLOGY WITH ANESTHESIA;  Surgeon: Radiologist, Medication, MD;  Location: [facility] OR;  Service: Radiology;  Laterality: N/A;   RADIOLOGY WITH ANESTHESIA N/A 10/11/2020   Procedure: IR WITH ANESTHESIA;  Surgeon: Radiologist, Medication, MD;  Location: [facility] OR;  Service: Radiology;  Laterality: N/A;   TEE WITHOUT CARDIOVERSION N/A 10/17/2020   Procedure: TRANSESOPHAGEAL ECHOCARDIOGRAM (TEE);  Surgeon: Jake Bathe, MD;  Location: [facility] ENDOSCOPY;  Service: Cardiovascular;  Laterality: N/A;   Past Surgical History:  Procedure Laterality Date   BUBBLE STUDY  10/17/2020   Procedure: BUBBLE STUDY;  Surgeon: Jake Bathe, MD;  [facility];  Service: Cardiovascular;;   IR ANGIO INTRA EXTRACRAN SEL COM CAROTID INNOMINATE UNI L MOD SED  10/11/2020   IR ANGIO VERTEBRAL SEL VERTEBRAL UNI L MOD SED  10/11/2020   IR CT HEAD LTD  10/11/2020   IR CT HEAD LTD  10/12/2020   IR PERCUTANEOUS ART THROMBECTOMY/INFUSION INTRACRANIAL INC DIAG ANGIO  10/11/2020   IR PERCUTANEOUS ART  THROMBECTOMY/INFUSION INTRACRANIAL INC DIAG ANGIO  10/12/2020   NO PAST SURGERIES     PATENT FORAMEN OVALE(PFO) CLOSURE N/A 12/10/2020   Procedure: PATENT FORAMEN OVALE (PFO) CLOSURE;  Surgeon: Yates Decamp, MD;  [facility];  Service: Cardiovascular;  Laterality: N/A;   RADIOLOGY WITH ANESTHESIA N/A 10/12/2020   Procedure: RADIOLOGY WITH ANESTHESIA;  Surgeon: Radiologist, Medication, MD;  [facility]: [facility];  Service: Radiology;  Laterality: N/A;   RADIOLOGY WITH ANESTHESIA N/A 10/11/2020   Procedure: IR WITH ANESTHESIA;  Surgeon: Radiologist, Medication, MD;  Location: [facility] OR;  Service: Radiology;  Laterality: N/A;   TEE WITHOUT CARDIOVERSION N/A 10/17/2020   Procedure: TRANSESOPHAGEAL ECHOCARDIOGRAM (TEE);  Surgeon: Jake Bathe, MD;  Location: [facility] ENDOSCOPY;  Service: Cardiovascular;  Laterality: N/A;   Past Medical History:  Diagnosis Date   Anxiety    Asthma    no treatments required at this time   Depression    Migraine headache    Nicotine vapor product  user 10/06/2019   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   Stroke (HCC)    Ht 5' 7.5" (1.715 m)   Wt 202 lb (91.6 kg)   BMI 31.17 kg/m   Opioid Risk Score:   Fall Risk Score:  `1  Depression screen PHQ 2/9  Depression screen [facility] 2/9 12/31/2020 12/16/2020 11/01/2020 10/06/2019  Decreased Interest 3 3 1 1   Down, Depressed, Hopeless 3 2 1 2   PHQ - 2 Score 6 5 2 3   Altered sleeping - 3 3 3   Tired, decreased energy - 3 3 3   Change in appetite - 2 1 3   Feeling bad or failure about yourself  - 1 1 1   Trouble concentrating - 1 1 2   Moving slowly or fidgety/restless - 0 1 1  Suicidal thoughts - 0 0 0  PHQ-9 Score - 15 12 16   Difficult doing work/chores - Extremely dIfficult Somewhat difficult Very difficult      Review of Systems  Musculoskeletal:  Positive for back pain.       Bilateral knee pain  All other systems reviewed and are negative.     Objective:   Physical Exam Constitutional:       Appearance: She is obese.  Eyes:     Extraocular Movements: Extraocular movements intact.     Conjunctiva/sclera: Conjunctivae normal.     Pupils: Pupils are equal, round, and reactive to light.  Cardiovascular:     Rate and Rhythm: Normal rate and regular rhythm.     Heart sounds: Normal heart sounds.  Pulmonary:     Effort: Pulmonary effort is normal. No respiratory distress.     Breath sounds: Normal breath sounds.  Abdominal:     General: Abdomen is flat. Bowel sounds are normal. There is no distension.     Palpations: Abdomen is soft.  Skin:    General: Skin is warm and dry.  Neurological:     Mental Status: She is alert and oriented to person, place, and time.     Cranial Nerves: No dysarthria or facial asymmetry.     Sensory: Sensation is intact.     Motor: Weakness and abnormal muscle tone present.     Coordination: Coordination abnormal.     Gait: Gait is intact.     Deep Tendon Reflexes:     Reflex Scores:      Tricep reflexes are 3+ on the left side.      Bicep reflexes are 3+ on the left side.      Brachioradialis reflexes are 3+ on the left side.    Comments: Motor strength is 4+ at the left deltoid deltoid, bicep, tricep, grip,  5/5 bilateral hip flexor knee extensor ankle dorsiflexor 5/5 right deltoid, bicep, tricep, grip  Psychiatric:        Mood and Affect: Mood normal.        Behavior: Behavior normal.   Musculoskeletal: No pain with upper extremity or lower extremity range of motion       Assessment & Plan:  #1.  History of right MCA infarct question PFO related now status post closure.  She has had an excellent recovery from her stroke and is basically back to a modified independent level.  She still has some problems with fatigue as well as mild left-sided weakness and mild left-sided coordination problems in the left upper extremity.  I do think she can return to work on a full-time basis starting in January. We discussed that fatigue may be  a lingering  issue but overall should improve as her physical conditioning improves. I do not think she needs any type of neuro stimulant medication for this.

## 2020-12-31 NOTE — Patient Instructions (Signed)
OK to return to [employer] in Jan

## 2021-01-02 ENCOUNTER — Ambulatory Visit: Payer: Managed Care, Other (non HMO) | Attending: Physician Assistant

## 2021-01-02 ENCOUNTER — Encounter: Payer: Self-pay | Admitting: Occupational Therapy

## 2021-01-02 ENCOUNTER — Ambulatory Visit: Payer: Managed Care, Other (non HMO) | Admitting: Occupational Therapy

## 2021-01-02 ENCOUNTER — Other Ambulatory Visit: Payer: Self-pay

## 2021-01-02 DIAGNOSIS — R278 Other lack of coordination: Secondary | ICD-10-CM

## 2021-01-02 DIAGNOSIS — R2681 Unsteadiness on feet: Secondary | ICD-10-CM | POA: Diagnosis present

## 2021-01-02 DIAGNOSIS — R269 Unspecified abnormalities of gait and mobility: Secondary | ICD-10-CM | POA: Diagnosis not present

## 2021-01-02 DIAGNOSIS — R262 Difficulty in walking, not elsewhere classified: Secondary | ICD-10-CM | POA: Diagnosis present

## 2021-01-02 DIAGNOSIS — I63511 Cerebral infarction due to unspecified occlusion or stenosis of right middle cerebral artery: Secondary | ICD-10-CM | POA: Diagnosis present

## 2021-01-02 DIAGNOSIS — M6281 Muscle weakness (generalized): Secondary | ICD-10-CM

## 2021-01-02 DIAGNOSIS — R2689 Other abnormalities of gait and mobility: Secondary | ICD-10-CM | POA: Insufficient documentation

## 2021-01-02 NOTE — Therapy (Signed)
[facility] MAIN [facility] SERVICES [address] [place], [place], [address] Phone: [phone]   Fax:  [phone]  Occupational Therapy Treatment  Patient Details  Name: Kristen Grant MRN: [id number] Date of Birth: 30-Nov-1995 Referring Provider (OT): Mariam Dollar, [place]   Encounter Date: 01/02/2021   OT End of Session - 01/02/21 1404     Visit Number 15    Number of Visits 24    Date for OT Re-Evaluation 01/19/21    Authorization Type Progress report period starting 11/27/2020    OT Start Time 1345    OT Stop Time 1430    OT Time Calculation (min) 45 min    Equipment Utilized During Treatment wc    Activity Tolerance Patient tolerated treatment well    Behavior During Therapy [facility] for tasks assessed/performed             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   Stroke [facility])     Past Surgical History:  Procedure Laterality Date   BUBBLE STUDY  10/17/2020   Procedure: BUBBLE STUDY;  Surgeon: Jake Bathe, MD;  [facility];  Service: Cardiovascular;;   IR ANGIO INTRA EXTRACRAN SEL COM CAROTID INNOMINATE UNI L MOD SED  10/11/2020   IR ANGIO VERTEBRAL SEL VERTEBRAL UNI L MOD SED  10/11/2020   IR CT HEAD LTD  10/11/2020   IR CT HEAD LTD  10/12/2020   IR PERCUTANEOUS ART THROMBECTOMY/INFUSION INTRACRANIAL INC DIAG ANGIO  10/11/2020   IR PERCUTANEOUS ART THROMBECTOMY/INFUSION INTRACRANIAL INC DIAG ANGIO  10/12/2020   NO PAST SURGERIES     PATENT FORAMEN OVALE(PFO) CLOSURE N/A 12/10/2020   Procedure: PATENT FORAMEN OVALE (PFO) CLOSURE;  Surgeon: Yates Decamp, MD;  [facility];  Service: Cardiovascular;  Laterality: N/A;   RADIOLOGY WITH ANESTHESIA N/A 10/12/2020   Procedure: RADIOLOGY WITH ANESTHESIA;  Surgeon: Radiologist, Medication,  MD;  [facility]: [facility] OR;  Service: Radiology;  Laterality: N/A;   RADIOLOGY WITH ANESTHESIA N/A 10/11/2020   Procedure: IR WITH ANESTHESIA;  Surgeon: Radiologist, Medication, MD;  Location: [facility] OR;  Service: Radiology;  Laterality: N/A;   TEE WITHOUT CARDIOVERSION N/A 10/17/2020   Procedure: TRANSESOPHAGEAL ECHOCARDIOGRAM (TEE);  Surgeon: Jake Bathe, MD;  Location: [facility] ENDOSCOPY;  Service: Cardiovascular;  Laterality: N/A;    There were no vitals filed for this visit.   Subjective Assessment - 01/02/21 1403     Subjective  Pt. reports she has to work today.    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    Limitations L sided hemiparesis, decreased balance, coordination, decreased indep with ADLs/IADLs/work/leisure    Currently in Pain? No/denies            OT TREATMENT     Therapeutic Ex:   Pt. worked on the [employer] for 8 min. With constant monitoring of the BUEs. Pt. worked on changing, and alternating forward reverse position every 2 min. Rest breaks were required.     Neuro muscular re-education:   Pt. worked on left hand [facility] skills grasping 1/2" circular tipped pegs, and placed them onto a pegboard placed at a  vertical angle. Pt. worked on translatory movements, moving the pegs through her hand from the palm to the tip of the digits. Pt. removed the pegs alternating thumb opposition to the tip of her 2nd through 5th digits. Pt. Worked on using her left hand to sort, and count simulated paper money while working to progressively increase speed, and challenged with dual tasking. Pt. Worked on this in preparation for work related tasks.  Pt. reports being tired after PT today, and is determining whether or not she will be going in to work. Pt. reports having difficulty working with objects with the tip of her fingers. Pt. was able to grasp the pegs, however had  difficulty moving the objects through her hand. Pt. continues to require increase increased time to sort, and count money. Pt. Lost count several times when challenged with dual tasking. Pt. continues to work on improving UE ROM, strength, motor control, and [facility] skills in order to work towards improving LUE functioning during ADLs, and IADL tasks.                          OT Education - 01/02/21 1403     Education Details coordination skills    Person(s) Educated Patient    Methods Explanation;Verbal cues    Comprehension Verbalized understanding;Verbal cues required;Need further instruction              OT Short Term Goals - 10/29/20 1247       OT SHORT TERM GOAL #1   Title Pt will perform HEP with min vc for improving LUE strength and coordination    Baseline Eval: initiated theraputty HEP at eval; further training needed    Time 6    Period Weeks    Status New    Target Date 12/08/20               OT Long Term Goals - 11/27/20 1622       OT LONG TERM GOAL #1   Title Pt will improve FOTO score to indicate improved functional performance.    Baseline 10th visit:  FOTO score: 77 Eval: incomplete FOTO, will complete next session    Time 12    Period Weeks    Status On-going    Target Date 01/19/21      OT LONG TERM GOAL #2   Title Pt will improve LUE strength by to enable pt to hold 5 y/o son and lift/carry heavy supplies for work and ADLs.    Baseline 1)th visit: UE strength is improving. Pt. is able to lift her son out of a shopping cart, however is unable to hold him for a duration.  Eval: Pt can not lift son (35-40 lbs).  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    Time 12    Period Weeks    Status On-going    Target Date 01/19/21      OT LONG TERM GOAL #3   Title Pt will improve LUE GMC/FMC skills to enable her to braid her own hair.    Baseline 10th visit: Pt. has not tried due to cutting her hair,  however would like to keep this goal. Eval: Dependent to braid hair    Time 12    Period Weeks    Status On-going    Target Date 01/19/21      OT LONG TERM GOAL #4   Title Pt will improve  L grip and pinch strength to enable her to open bottles and containers with modified indep.    Baseline 10th visit: Left grip strength: 25 lbs, lateral pInch strength: 11#, 3pt. pinch: 8#  pt. is able to open bottles. Eval: Requires assist to open bottles and containers (L grip 20 lbs, R 56; L lateral pinch 10 lbs, R 16, L 3 point pinch 6 lbs, R 12.    Time 12    Period Weeks    Status On-going    Target Date 01/19/21      OT LONG TERM GOAL #5   Title Pt will improve BUE coordination to enable indep sweeping, making beds, and other light home making tasks.    Baseline 10th visit: Pt. is able to perfrom laundry, and dishes. Pt. requires assist for bedmaking tasks. Eval: assist needed with IADL tasks which require use of 2 hands.    Time 12    Period Weeks    Status On-going                   Plan - 01/02/21 1404     Clinical Impression Statement Pt. reports being tired after PT today, and is determining whether or not she will be going in to work. Pt. reports having difficulty working with objects with the tip of her fingers. Pt. was able to grasp the pegs, however had difficulty moving the objects through her hand. Pt. continues to require increase increased time to sort, and count money. Pt. Lost count several times when challenged with dual tasking. Pt. continues to work on improving UE ROM, strength, motor control, and [facility] skills in order to work towards improving LUE functioning during ADLs, and IADL tasks.        OT Occupational Profile and History Detailed Assessment- Review of Records and additional review of physical, cognitive, psychosocial history related to current functional performance    Occupational performance deficits (Please refer to evaluation for details):  ADL's;IADL's;Leisure;Work    Games developer / Function / Physical Skills ADL;Coordination;Endurance;UE functional use;Balance;Body mechanics;IADL;Dexterity;FMC;Strength;Mobility;ROM    Rehab Potential Excellent    Clinical Decision Making Several treatment options, min-mod task modification necessary    Comorbidities Affecting Occupational Performance: May have comorbidities impacting occupational performance    Modification or Assistance to Complete Evaluation  Min-Moderate modification of tasks or assist with assess necessary to complete eval    OT Frequency 2x / week    OT Duration 12 weeks    OT Treatment/Interventions Self-care/ADL training;Therapeutic exercise;DME and/or AE instruction;Neuromuscular education;Manual Therapy;Passive range of motion;Therapeutic activities;Patient/family education    Consulted and Agree with Plan of Care Patient;Family member/caregiver             Patient will benefit from skilled therapeutic intervention in order to improve the following deficits and impairments:   Body Structure / Function / Physical Skills: ADL, Coordination, Endurance, UE functional use, Balance, Body mechanics, IADL, Dexterity, FMC, Strength, Mobility, ROM       Visit Diagnosis: Muscle weakness (generalized)  Other lack of coordination    Problem List Patient Active Problem List   Diagnosis Date Noted   History of stroke 12/16/2020   Depression, major, single episode, moderate (HCC) 12/16/2020   PFO (patent foramen ovale) 12/09/2020   Anemia    Hemiparesis affecting left side as late effect of stroke (HCC)    Leukocytosis    Dyslipidemia    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

## 2021-01-02 NOTE — Therapy (Signed)
[place] [facility] MAIN [facility] SERVICES [address] [place], [place], [address] Phone: [phone]   Fax:  [phone]  Physical Therapy Treatment  Patient Details  Name: Kristen Grant MRN: [id number] Date of Birth: 03/23/1995 Referring Provider (PT): Mariam Dollar   Encounter Date: 01/02/2021   PT End of Session - 01/02/21 2132     Visit Number 14    Number of Visits 25    Date for PT Re-Evaluation 01/20/21    Authorization Type [employer] Managed    Authorization Time Period 10/28/20-01/20/21; PN on 12/05/2020    Progress Note Due on Visit 20    PT Start Time 1303    PT Stop Time 1344    PT Time Calculation (min) 41 min    Equipment Utilized During Treatment Gait belt    Activity Tolerance Patient tolerated treatment well    Behavior During Therapy WFL for tasks assessed/performed             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   Stroke [facility])     Past Surgical History:  Procedure Laterality Date   BUBBLE STUDY  10/17/2020   Procedure: BUBBLE STUDY;  Surgeon: Jake Bathe, MD;  Location: [facility] ENDOSCOPY;  Service: Cardiovascular;;   IR ANGIO INTRA EXTRACRAN SEL COM CAROTID INNOMINATE UNI L MOD SED  10/11/2020   IR ANGIO VERTEBRAL SEL VERTEBRAL UNI L MOD SED  10/11/2020   IR CT HEAD LTD  10/11/2020   IR CT HEAD LTD  10/12/2020   IR PERCUTANEOUS ART THROMBECTOMY/INFUSION INTRACRANIAL INC DIAG ANGIO  10/11/2020   IR PERCUTANEOUS ART THROMBECTOMY/INFUSION INTRACRANIAL INC DIAG ANGIO  10/12/2020   NO PAST SURGERIES     PATENT FORAMEN OVALE(PFO) CLOSURE N/A 12/10/2020   Procedure: PATENT FORAMEN OVALE (PFO) CLOSURE;  Surgeon: Yates Decamp, MD;  Location: [facility] INVASIVE CV LAB;  Service: Cardiovascular;  Laterality: N/A;   RADIOLOGY WITH ANESTHESIA N/A 10/12/2020    Procedure: RADIOLOGY WITH ANESTHESIA;  Surgeon: Radiologist, Medication, MD;  Location: [facility] OR;  Service: Radiology;  Laterality: N/A;   RADIOLOGY WITH ANESTHESIA N/A 10/11/2020   Procedure: IR WITH ANESTHESIA;  Surgeon: Radiologist, Medication, MD;  Location: [facility] OR;  Service: Radiology;  Laterality: N/A;   TEE WITHOUT CARDIOVERSION N/A 10/17/2020   Procedure: TRANSESOPHAGEAL ECHOCARDIOGRAM (TEE);  Surgeon: Jake Bathe, MD;  Location: [facility] ENDOSCOPY;  Service: Cardiovascular;  Laterality: N/A;    There were no vitals filed for this visit.   Subjective Assessment - 01/02/21 1310     Subjective Patient reports doing well and had a good checkup with her MD. States she is working from 5-9 pm later today.    Patient is accompained by: Family member    Pertinent History Kristen Grant is a 25 year old right-handed female with morbid obesity BMI 29.92, anxiety, migraine headaches, tobacco use.  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  Cranial CT scan showed hypodensity in the right anterior temporal lobe concerning for infarct.  Pertaining to patient's right MCA infarct predominantly right caudate putamen anterior limb right internal capsule secondary to right M1 occlusion status post revascularization mechanical thrombectomy requiring  repeat thrombectomy 10/12/2020 for occluded dominant superior division of right MCA.    Limitations Walking;House hold activities    How long can you sit comfortably? no limitations    How long can you stand comfortably? 30 min    How long can you walk comfortably? 20 min    Patient Stated Goals To improve my balance and endurance back to my previous level of working full time and taking care of 5 year son    Pain Onset In the past 7 days            INTERVENTIONS:   Therapeutic Exercises:   BIodex  gait trainer:   Total Time= 1st 3 min with UE support and last 2 min wihtout UE support  Time on each foot = 50/50  Step length= 0.53 m on R; 0.55 m on left *Min VC to increase step length   Instruction in deadlift using matrix cable system 15 lb using bar  BUE Support on bar- 2 sets x 10 reps- Patient reports as tiring. VC, TC and Visual Demo for instruction in correct technique. Patient with initial difficulty but vastly improved with 2nd set.   Instructed in Split squat TRX- 2 sets of 10 reps. Patient able to return demo exhibiting correct technique.   S/L hip ER/ Clamshell x 10 reps and then another 10 reps with GTB- Patient reports as challenging.   S/L hip ABD with GTB (around ankles) x 10 reps BLE and no resistance x 10 reps. Patient reported fatigued and at end- "hard"  Education provided throughout session via VC/TC and demonstration to facilitate movement at target joints and correct muscle activation for all testing and exercises performed.    Treatment focused on Strength/endurance today- Patient arrived with excellent motivation stating she needed to improve her strength and stamina for work purposes. She was able to progress resistance with LE Strengthening with fatigue as only limiting factor. She was able to walk on TM without any issues clearing left foot today. The pt will benefit from further skilled PT to continue to improve LE strength, endurance, and balance to return to PLOF.                   PT Education - 01/02/21 2129     Education Details exercise technique    Person(s) Educated Patient    Methods Explanation;Demonstration;Tactile cues;Verbal cues    Comprehension Verbalized understanding;Returned demonstration;Verbal cues required;Need further instruction              PT Short Term Goals - 12/05/20 1332       PT SHORT TERM GOAL #1   Title Pt will be independent with HEP in order to improve strength and balance in order to  decrease fall risk and improve function at home and work.    Baseline 10/28/2020- Patient has no formal HEP in place; 12/05/2020=Patient verbalized understanding of balance/walking HEP and states no problem or difficulty performing at this time    Time 6    Period Weeks    Status Achieved    Target Date 12/09/20               PT Long Term Goals - 12/05/20 1332       PT LONG TERM GOAL #1   Title Pt will improve FOTO to target score of 75 to  display perceived improvements in ability to complete ADL's.    Baseline 10/28/2020= 71; 12/05/2020= 74    Time 12  Period Weeks    Status On-going    Target Date 01/20/21      PT LONG TERM GOAL #2   Title Pt will decrease 5TSTS by at least 3 seconds in order to demonstrate clinically significant improvement in LE strength    Baseline 10/28/2020= 14.61 sec without UE support; 12/05/2020= 13.60 sec without UE support    Time 12    Period Weeks    Status On-going      PT LONG TERM GOAL #3   Title Patient will increase Functional Gait Assessment score to >26/30 as to reduce fall risk and improve dynamic gait safety with community ambulation.    Baseline 10/28/2020= 23/30; 12/05/2020= 30/30    Time 12    Period Weeks    Status Achieved                   Plan - 01/02/21 2146     Clinical Impression Statement Treatment focused on Strength/endurance today- Patient arrived with excellent motivation stating she needed to improve her strength and stamina for work purposes. She was able to progress resistance with LE Strengthening with fatigue as only limiting factor. She was able to walk on TM without any issues clearing left foot today. The pt will benefit from further skilled PT to continue to improve LE strength, endurance, and balance to return to PLOF.    Personal Factors and Comorbidities Comorbidity 1    Comorbidities CVA    Examination-Activity Limitations Caring for Others;Stairs    Examination-Participation Restrictions Community  Activity;Occupation;Yard Work    Stability/Clinical Decision Making Stable/Uncomplicated    Rehab Potential Good    PT Frequency 2x / week    PT Duration 12 weeks    PT Treatment/Interventions ADLs/Self Care Home Management;Cryotherapy;Moist Heat;DME Instruction;Gait training;Stair training;Functional mobility training;Therapeutic activities;Therapeutic exercise;Balance training;Neuromuscular re-education;Patient/family education;Manual techniques;Passive range of motion;Dry needling    PT Next Visit Plan Heavy focus needed on isolated hip flexion and ankle DF strengthening (persistent deficit); LLE fine motor control training; DF strengthening on L, endurance    PT Home Exercise Plan 10/31/2020: Access Code: [email or web address]  (balance exercises); no updates    Consulted and Agree with Plan of Care Patient             Patient will benefit from skilled therapeutic intervention in order to improve the following deficits and impairments:  Abnormal gait, Decreased activity tolerance, Decreased balance, Decreased coordination, Decreased endurance, Cardiopulmonary status limiting activity, Decreased mobility, Decreased strength, Difficulty walking, Impaired UE functional use  Visit Diagnosis: Abnormality of gait and mobility  Difficulty in walking, not elsewhere classified  Muscle weakness (generalized)  Unsteadiness on feet     Problem List Patient Active Problem List   Diagnosis Date Noted   History of stroke 12/16/2020   Depression, major, single episode, moderate (HCC) 12/16/2020   PFO (patent foramen ovale) 12/09/2020   Anemia    Hemiparesis affecting left side as late effect of stroke (HCC)    Leukocytosis    Dyslipidemia    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

## 2021-01-06 ENCOUNTER — Encounter: Payer: Self-pay | Admitting: Cardiology

## 2021-01-06 ENCOUNTER — Ambulatory Visit: Payer: Managed Care, Other (non HMO) | Admitting: Cardiology

## 2021-01-06 ENCOUNTER — Ambulatory Visit: Payer: Managed Care, Other (non HMO)

## 2021-01-06 ENCOUNTER — Other Ambulatory Visit: Payer: Self-pay

## 2021-01-06 VITALS — BP 110/71 | HR 95 | Temp 98.3°F | Resp 16 | Ht 67.5 in | Wt 201.2 lb

## 2021-01-06 DIAGNOSIS — I749 Embolism and thrombosis of unspecified artery: Secondary | ICD-10-CM

## 2021-01-06 DIAGNOSIS — G43109 Migraine with aura, not intractable, without status migrainosus: Secondary | ICD-10-CM

## 2021-01-06 DIAGNOSIS — Q2112 Patent foramen ovale: Secondary | ICD-10-CM

## 2021-01-06 DIAGNOSIS — I688 Other cerebrovascular disorders in diseases classified elsewhere: Secondary | ICD-10-CM

## 2021-01-06 DIAGNOSIS — Z8774 Personal history of (corrected) congenital malformations of heart and circulatory system: Secondary | ICD-10-CM

## 2021-01-06 NOTE — Progress Notes (Signed)
Primary Physician/Referring:  Charyl Dancer, NP  Patient ID: Kristen Grant, female    DOB: 01-16-1996, 25 y.o.   MRN: [id number]  Chief Complaint  Patient presents with   PFO repair   Follow-up    6 weeks   HPI:    Kristen Grant  is a 25 y.o. Caucasian female patient with no significant prior cardiovascular history, patient was admitted to the hospital on 10/11/2020 with acute stroke, she had right distal M1 segment near complete occlusion for which she mechanical thrombectomy, but due to recurrence of strokelike symptoms, underwent repeat percutaneous thrombectomy on 10/21/2020.  She was also found to have a moderate to large sized PFO by transcranial bubble study and a  PFO by TEE.  She underwent repair of the atrial septal defect on 12/10/2020 without any complication.  She now presents for follow-up.  States that migraine symptoms have improved and essentially resolved since PFO closure.  She is tolerating aspirin and Plavix without complication.  She has reduced the dose of Topamax from twice daily to once a day.  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   Stroke [facility])    Past Surgical History:  Procedure Laterality Date   BUBBLE STUDY  10/17/2020   Procedure: BUBBLE STUDY;  Surgeon: Jerline Pain, MD;  Location: [place] ENDOSCOPY;  Service: Cardiovascular;;   IR ANGIO INTRA EXTRACRAN SEL COM CAROTID INNOMINATE UNI L MOD SED  10/11/2020   IR ANGIO VERTEBRAL SEL VERTEBRAL UNI L MOD SED  10/11/2020   IR CT HEAD LTD  10/11/2020   IR CT HEAD LTD  10/12/2020   IR PERCUTANEOUS ART THROMBECTOMY/INFUSION INTRACRANIAL INC DIAG ANGIO  10/11/2020   IR PERCUTANEOUS ART THROMBECTOMY/INFUSION INTRACRANIAL INC DIAG ANGIO  10/12/2020   NO PAST SURGERIES     PATENT FORAMEN OVALE(PFO) CLOSURE N/A 12/10/2020   Procedure:  PATENT FORAMEN OVALE (PFO) CLOSURE;  Surgeon: Adrian Prows, MD;  Location: [place] CV LAB;  Service: Cardiovascular;  Laterality: N/A;   RADIOLOGY WITH ANESTHESIA N/A 10/12/2020   Procedure: RADIOLOGY WITH ANESTHESIA;  Surgeon: Radiologist, Medication, MD;  Location: [place];  Service: Radiology;  Laterality: N/A;   RADIOLOGY WITH ANESTHESIA N/A 10/11/2020   Procedure: IR WITH ANESTHESIA;  Surgeon: Radiologist, Medication, MD;  Location: [place];  Service: Radiology;  Laterality: N/A;   TEE WITHOUT CARDIOVERSION N/A 10/17/2020   Procedure: TRANSESOPHAGEAL ECHOCARDIOGRAM (TEE);  Surgeon: Jerline Pain, MD;  Location: [facility] ENDOSCOPY;  Service: Cardiovascular;  Laterality: N/A;   Family History  Problem Relation Age of Onset   Hypertension Mother    Epilepsy Mother    Anxiety disorder Mother    Depression Mother    Migraines Mother    Diabetes Maternal Grandmother    Stroke Maternal Grandmother    Hypertension Maternal Grandmother    Asthma Half-Brother    Asthma Half-Sister    Breast cancer Neg Hx     Social History   Tobacco Use   Smoking status: Former    Types: E-cigarettes    Start date: 2021   Smokeless tobacco: Never  Substance Use Topics   Alcohol use: Not Currently   Marital Status: Single  ROS  Review of Systems  Cardiovascular:  Negative for chest pain, dyspnea on exertion and  leg swelling.  Gastrointestinal:  Negative for melena.  Neurological:  Positive for focal weakness (left arm).  Objective  Blood pressure 110/71, pulse 95, temperature 98.3 F (36.8 C), temperature source Temporal, resp. rate 16, height 5' 7.5" (1.715 m), weight 201 lb 3.2 oz (91.3 kg), SpO2 96 %. Body mass index is 31.05 kg/m.  Vitals with BMI 01/06/2021 12/31/2020 12/16/2020  Height 5' 7.5" 5' 7.5" 5' 7.9"  Weight 201 lbs 3 oz 202 lbs 201 lbs 6 oz  BMI 31.03 [id number]  Systolic [id number]  Diastolic 71 84 71  Pulse 95 74 80  Some encounter information is confidential and restricted. Go to  Review Flowsheets activity to see all data.     Physical Exam Neck:     Vascular: No carotid bruit or JVD.  Cardiovascular:     Rate and Rhythm: Normal rate and regular rhythm.     Pulses: Intact distal pulses.     Heart sounds: Normal heart sounds. No murmur heard.   No gallop.  Pulmonary:     Effort: Pulmonary effort is normal.     Breath sounds: Normal breath sounds.  Abdominal:     General: Bowel sounds are normal.     Palpations: Abdomen is soft.  Musculoskeletal:        General: No swelling.  Neurological:     Motor: Weakness (left arm) present.     Laboratory examination:   Recent Labs    10/13/20 0308 10/18/20 0517 11/13/20 2137 12/06/20 1606  NA 140 138 133* 139  K 3.6 3.5 3.0* 3.5  CL 112* 103 104 104  CO2 21* 19* 19* 22  GLUCOSE 139* 106* 102* 110*  BUN 6 15 10 6   CREATININE 0.45 0.59 1.20* 0.60  CALCIUM 8.4* 9.6 9.1 9.3  GFRNONAA >60 >60 >60  --    CrCl cannot be calculated (Patient's most recent lab result is older than the maximum 21 days allowed.).  CMP Latest Ref Rng & Units 12/06/2020 11/13/2020 10/18/2020  Glucose 70 - 99 mg/dL 110(H) 102(H) 106(H)  BUN 6 - 20 mg/dL 6 10 15   Creatinine 0.57 - 1.00 mg/dL 0.60 1.20(H) 0.59  Sodium 134 - 144 mmol/L 139 133(L) 138  Potassium 3.5 - 5.2 mmol/L 3.5 3.0(L) 3.5  Chloride 96 - 106 mmol/L 104 104 103  CO2 20 - 29 mmol/L 22 19(L) 19(L)  Calcium 8.7 - 10.2 mg/dL 9.3 9.1 9.6  Total Protein 6.5 - 8.1 g/dL - 7.0 6.7  Total Bilirubin 0.3 - 1.2 mg/dL - 2.2(H) 1.9(H)  Alkaline Phos 38 - 126 U/L - 70 55  AST 15 - 41 U/L - 33 21  ALT 0 - 44 U/L - 21 20   CBC Latest Ref Rng & Units 12/06/2020 11/13/2020 10/21/2020  WBC 3.4 - 10.8 x10E3/uL 8.2 8.5 8.4  Hemoglobin 11.1 - 15.9 g/dL 13.0 14.0 11.6(L)  Hematocrit 34.0 - 46.6 % 37.6 40.8 34.9(L)  Platelets 150 - 450 x10E3/uL 307 248 328    Lipid Panel Recent Labs    10/12/20 0018 12/03/20 0840  CHOL 171 109  TRIG 55 110  LDLCALC 117* 53  VLDL 11  --   HDL 43  36*  CHOLHDL 4.0 3.0   Lipid Panel     Component Value Date/Time   CHOL 109 12/03/2020 0840   TRIG 110 12/03/2020 0840   HDL 36 (L) 12/03/2020 0840   CHOLHDL 3.0 12/03/2020 0840   CHOLHDL 4.0 10/12/2020 0018   VLDL 11 10/12/2020  0018   LDLCALC 53 12/03/2020 0840   LABVLDL 20 12/03/2020 0840     HEMOGLOBIN A1C Lab Results  Component Value Date   HGBA1C 5.0 10/12/2020   MPG 96.8 10/12/2020   TSH No results for input(s): TSH in the last 8760 hours.  Medications and allergies  No Known Allergies   Medication prior to this encounter:   Outpatient Medications Prior to Visit  Medication Sig Dispense Refill   aspirin 81 MG EC tablet Take 1 tablet (81 mg total) by mouth daily. Swallow whole. 100 tablet 0   atorvastatin (LIPITOR) 80 MG tablet Take 1 tablet (80 mg total) by mouth daily. 90 tablet 3   clopidogrel (PLAVIX) 75 MG tablet Take 1 tablet (75 mg total) by mouth daily. 90 tablet 0   hydrOXYzine (VISTARIL) 25 MG capsule Take 1 capsule (25 mg total) by mouth every 8 (eight) hours as needed. 30 capsule 0   sertraline (ZOLOFT) 25 MG tablet Take 1 tablet (25 mg total) by mouth daily. 30 tablet 1   topiramate (TOPAMAX) 25 MG tablet Take 1 tablet (25 mg total) by mouth 2 (two) times daily. (Patient taking differently: Take 25 mg by mouth daily.) 180 tablet 3   No facility-administered medications prior to visit.     Medication list after today's encounter   Current Outpatient Medications  Medication Instructions   Aspirin Low Dose 81 mg, Oral, Daily, Swallow whole.   atorvastatin (LIPITOR) 80 mg, Oral, Daily   clopidogrel (PLAVIX) 75 mg, Oral, Daily   hydrOXYzine (VISTARIL) 25 mg, Oral, Every 8 hours PRN   sertraline (ZOLOFT) 25 mg, Oral, Daily   topiramate (TOPAMAX) 25 mg, Oral, 2 times daily    Radiology:   No results found.  Cardiac Studies:   TEE 10/17/2020:  1. There are very few bubbles seen in cross over upon second bubble study performed. A small PFO may be  present. There is also small color flow Doppler signal seen at PFO sight.  2. Left ventricular ejection fraction, by estimation, is 60 to 65%. The left ventricle has normal function.  3. Right ventricular systolic function is normal. The right ventricular size is normal.  4. No left atrial/left atrial appendage thrombus was detected.  5. The mitral valve is normal in structure. No evidence of mitral valve regurgitation.  6. The aortic valve is normal in structure. Aortic valve regurgitation is not visualized.  7. Minimal evidence of shunting.  Lower extremity venous duplex 10/14/2020: No evidence of DVT bilaterally.  Intracardiac echo guided closure of the patent foramen ovale on 12/10/2020 with implantation of a 25 mm Amplatzer Talisman PFO occluder.   Recommendation: Aspirin indefinitely, Plavix 3 months, endocarditis prophylaxis 6 months.  Patient to be discharged home today.  Echocardiogram 12/10/2020:  1. Left ventricular ejection fraction, by estimation, is 60 to 65%. The left ventricle has normal function. The left ventricle has no regional wall motion abnormalities.  2. Right ventricular systolic function is normal. The right ventricular size is normal.  3. 25 mm Apmplatzer PFO occluder noted in good position without color Doppler evidence of residual shunting.  4. The mitral valve is normal in structure. Trivial mitral valve regurgitation. No evidence of mitral stenosis.  5. The aortic valve is normal in structure. Aortic valve regurgitation is not visualized. No aortic stenosis is present.  EKG:   EKG 11/29/2020: Normal sinus rhythm at rate of 63 bpm, normal axis.  No evidence of ischemia, normal EKG.    Assessment  ICD-10-CM   1. PFO (patent foramen ovale)  Q21.12 PCV ECHOCARDIOGRAM COMPLETE    2. 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 Migraine with aura and without status migrainosus, not intractable   G43.109     4. History of repair of congenital atrial septal defect (ASD) 25 mm Amplatzer Talisman PFO occluder.  Z87.74 PCV ECHOCARDIOGRAM COMPLETE       There are no discontinued medications.   No orders of the defined types were placed in this encounter.  Orders Placed This Encounter  Procedures   PCV ECHOCARDIOGRAM COMPLETE    Standing Status:   Future    Standing Expiration Date:   01/06/2022   Recommendations:   Kristen Grant is a 25 y.o. Caucasian female patient with no significant prior cardiovascular history, patient was admitted to the hospital on 10/11/2020 with acute stroke, she had right distal M1 segment near complete occlusion for which she mechanical thrombectomy, but due to recurrence of strokelike symptoms, underwent repeat percutaneous thrombectomy on 10/21/2020.  She was also found to have a moderate to large sized PFO by transcranial bubble study and a PFO by TEE.    Patient underwent repair of the PFO on 12/10/2020 without any complication.  She now presents for follow-up.  She has not had any further episodes of TIA-like symptoms, her migraine episodes have also essentially resolved.  Physical examination is unremarkable.  Right groin site has healed well.  Advised her to continue with aspirin 81 mg along with Plavix 75 mg daily for the total of 3 months and discontinue this.  She will need endocarditis prophylaxis for a total of 6 months after the PFO closure.  Otherwise no further evaluation is indicated from cardiac standpoint, I will see her back in a year with repeat echocardiogram and if stable, on a as needed basis.  Weight loss, primary prevention discussed with the patient.  Since PFO closure, she has not had any recurrence of migraine headaches, she has reduced the dose of Topamax to once a day, advised her she could wean herself off of Topamax completely.    Adrian Prows, MD, [facility] 01/06/2021, 11:04 AM Office: [phone]

## 2021-01-08 ENCOUNTER — Other Ambulatory Visit: Payer: Self-pay

## 2021-01-08 ENCOUNTER — Ambulatory Visit: Payer: Managed Care, Other (non HMO)

## 2021-01-08 DIAGNOSIS — R2681 Unsteadiness on feet: Secondary | ICD-10-CM

## 2021-01-08 DIAGNOSIS — M6281 Muscle weakness (generalized): Secondary | ICD-10-CM

## 2021-01-08 DIAGNOSIS — R262 Difficulty in walking, not elsewhere classified: Secondary | ICD-10-CM

## 2021-01-08 DIAGNOSIS — R278 Other lack of coordination: Secondary | ICD-10-CM

## 2021-01-08 DIAGNOSIS — R269 Unspecified abnormalities of gait and mobility: Secondary | ICD-10-CM | POA: Diagnosis not present

## 2021-01-08 NOTE — Therapy (Signed)
[facility] MAIN [facility] SERVICES [address] [place], [place], [address] Phone: [phone]   Fax:  [phone]  Physical Therapy Treatment  Patient Details  Name: Kristen Grant MRN: [id number] Date of Birth: [birth year] Referring Provider (PT): Mariam Dollar   Encounter Date: 01/08/2021   PT End of Session - 01/08/21 1159     Visit Number 15    Number of Visits 25    Date for PT Re-Evaluation 01/20/21    Authorization Type [employer] Managed    Authorization Time Period 10/28/20-01/20/21; PN on 12/05/2020    Progress Note Due on Visit 20    PT Start Time 1346    PT Stop Time 1428    PT Time Calculation (min) 42 min    Equipment Utilized During Treatment Gait belt    Activity Tolerance Patient tolerated treatment well    Behavior During Therapy WFL for tasks assessed/performed             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   Stroke [facility])     Past Surgical History:  Procedure Laterality Date   BUBBLE STUDY  10/17/2020   Procedure: BUBBLE STUDY;  Surgeon: Jake Bathe, MD;  [facility] ENDOSCOPY;  Service: Cardiovascular;;   IR ANGIO INTRA EXTRACRAN SEL COM CAROTID INNOMINATE UNI L MOD SED  10/11/2020   IR ANGIO VERTEBRAL SEL VERTEBRAL UNI L MOD SED  10/11/2020   IR CT HEAD LTD  10/11/2020   IR CT HEAD LTD  10/12/2020   IR PERCUTANEOUS ART THROMBECTOMY/INFUSION INTRACRANIAL INC DIAG ANGIO  10/11/2020   IR PERCUTANEOUS ART THROMBECTOMY/INFUSION INTRACRANIAL INC DIAG ANGIO  10/12/2020   NO PAST SURGERIES     PATENT FORAMEN OVALE(PFO) CLOSURE N/A 12/10/2020   Procedure: PATENT FORAMEN OVALE (PFO) CLOSURE;  Surgeon: Yates Decamp, MD;  [facility] INVASIVE CV LAB;  Service: Cardiovascular;  Laterality: N/A;   RADIOLOGY WITH ANESTHESIA N/A 10/12/2020    Procedure: RADIOLOGY WITH ANESTHESIA;  Surgeon: Radiologist, Medication, MD;  Location: [facility] OR;  Service: Radiology;  Laterality: N/A;   RADIOLOGY WITH ANESTHESIA N/A 10/11/2020   Procedure: IR WITH ANESTHESIA;  Surgeon: Radiologist, Medication, MD;  Location: [facility] OR;  Service: Radiology;  Laterality: N/A;   TEE WITHOUT CARDIOVERSION N/A 10/17/2020   Procedure: TRANSESOPHAGEAL ECHOCARDIOGRAM (TEE);  Surgeon: Jake Bathe, MD;  Location: [facility] ENDOSCOPY;  Service: Cardiovascular;  Laterality: N/A;    There were no vitals filed for this visit.   Subjective Assessment - 01/08/21 1158     Subjective Patient reports she was sore after last visit "but in a good way and thats what I need to get me ready to go back to work."    Patient is accompained by: Family member    Pertinent History Kristen Grant is a 25 year old right-handed female with morbid obesity BMI 29.92, anxiety, migraine headaches, tobacco use.  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  Cranial CT scan showed hypodensity in the right anterior temporal lobe concerning for infarct.  Pertaining to patient's right MCA infarct predominantly right caudate putamen anterior limb right internal capsule secondary to right M1 occlusion  status post revascularization mechanical thrombectomy requiring repeat thrombectomy 10/12/2020 for occluded dominant superior division of right MCA.    Limitations Walking;House hold activities    How long can you sit comfortably? no limitations    How long can you stand comfortably? 30 min    How long can you walk comfortably? 20 min    Patient Stated Goals To improve my balance and endurance back to my previous level of working full time and taking care of 5 year son    Currently in Pain? No/denies    Pain Onset In the past 7 days              INTERVENTIONS:  Therapeutic Exercises:   Deadlifts 7lb- each arm- 3 sets of 10 reps (VC/TC for correct technique today)   Squats -TRX- BUE - with chair placed behind patient and VC for specific technique.   Step up onto 6" block  with opp LE performing  high knees - 3 sets of 10 reps- VC for high knees   Resistive gait using 17.5 lb x 4 forward/backward/Both sides- Patient with initial difficulty coordinating movement yet much improved as she completed more reps.  Ladder drills- Side step down and back x 2, Forward/back x length of ladder and back x 2, forward/Diagonal x 2. Patient with mild difficulty coordinating yet no issues with foot clearance despite fatigue exhibited at end of session.   Education provided throughout session via VC/TC and demonstration to facilitate movement at target joints and correct muscle activation for all testing and exercises performed.                            PT Education - 01/09/21 1159     Education Details Exercise technique    Person(s) Educated Patient    Methods Explanation;Demonstration;Tactile cues;Verbal cues    Comprehension Verbalized understanding;Returned demonstration;Verbal cues required;Need further instruction;Tactile cues required              PT Short Term Goals - 12/05/20 1332       PT SHORT TERM GOAL #1   Title Pt will be independent with HEP in order to improve strength and balance in order to decrease fall risk and improve function at home and work.    Baseline 10/28/2020- Patient has no formal HEP in place; 12/05/2020=Patient verbalized understanding of balance/walking HEP and states no problem or difficulty performing at this time    Time 6    Period Weeks    Status Achieved    Target Date 12/09/20               PT Long Term Goals - 12/05/20 1332       PT LONG TERM GOAL #1   Title Pt will improve FOTO to target score of 75 to  display perceived improvements in ability to  complete ADL's.    Baseline 10/28/2020= 71; 12/05/2020= 74    Time 12    Period Weeks    Status On-going    Target Date 01/20/21      PT LONG TERM GOAL #2   Title Pt will decrease 5TSTS by at least 3 seconds in order to demonstrate clinically significant improvement in LE strength    Baseline 10/28/2020= 14.61 sec without UE support; 12/05/2020= 13.60 sec without UE support    Time 12    Period Weeks    Status On-going      PT LONG TERM GOAL #3  Title Patient will increase Functional Gait Assessment score to >26/30 as to reduce fall risk and improve dynamic gait safety with community ambulation.    Baseline 10/28/2020= 23/30; 12/05/2020= 30/30    Time 12    Period Weeks    Status Achieved                   Plan - 01/09/21 1200     Clinical Impression Statement Patient performed well overall- able to increase LE exercises well today exhibiting only fatigue as limiting factor but less overall fatigue today vs last visit. She was responsive to all VC for good and safe technique with dead lift and other activities. The pt will benefit from further skilled PT to continue to improve LE strength, endurance, and balance to return to PLOF.    Personal Factors and Comorbidities Comorbidity 1    Comorbidities CVA    Examination-Activity Limitations Caring for Others;Stairs    Examination-Participation Restrictions Community Activity;Occupation;Yard Work    Stability/Clinical Decision Making Stable/Uncomplicated    Rehab Potential Good    PT Frequency 2x / week    PT Duration 12 weeks    PT Treatment/Interventions ADLs/Self Care Home Management;Cryotherapy;Moist Heat;DME Instruction;Gait training;Stair training;Functional mobility training;Therapeutic activities;Therapeutic exercise;Balance training;Neuromuscular re-education;Patient/family education;Manual techniques;Passive range of motion;Dry needling    PT Next Visit Plan Heavy focus needed on isolated hip flexion and ankle DF  strengthening (persistent deficit); LLE fine motor control training; DF strengthening on L, endurance    PT Home Exercise Plan 10/31/2020: Access Code: [email or web address]  (balance exercises); no updates    Consulted and Agree with Plan of Care Patient             Patient will benefit from skilled therapeutic intervention in order to improve the following deficits and impairments:  Abnormal gait, Decreased activity tolerance, Decreased balance, Decreased coordination, Decreased endurance, Cardiopulmonary status limiting activity, Decreased mobility, Decreased strength, Difficulty walking, Impaired UE functional use  Visit Diagnosis: Abnormality of gait and mobility  Difficulty in walking, not elsewhere classified  Muscle weakness (generalized)  Unsteadiness on feet     Problem List Patient Active Problem List   Diagnosis Date Noted   History of stroke 12/16/2020   Depression, major, single episode, moderate (HCC) 12/16/2020   PFO (patent foramen ovale) 12/09/2020   Anemia    Hemiparesis affecting left side as late effect of stroke (HCC)    Leukocytosis    Dyslipidemia    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 [place], [place], [address] Phone: [phone]   Fax:  [phone]  Name: Kristen Grant MRN: [id number] Date of Birth: 11-Oct-1995

## 2021-01-09 NOTE — Therapy (Signed)
[facility] MAIN [facility] SERVICES [address] [place], [place], [address] Phone: [phone]   Fax:  [phone]  Occupational Therapy Treatment  Patient Details  Name: Kristen Grant MRN: [id number] Date of Birth: May 14, 1995 Referring Provider (OT): Mariam Dollar, [place]   Encounter Date: 01/08/2021   OT End of Session - 01/09/21 1114     Visit Number 16    Number of Visits 24    Date for OT Re-Evaluation 01/19/21    Authorization Type Progress report period starting 11/27/2020    OT Start Time 1300    OT Stop Time 1345    OT Time Calculation (min) 45 min    Activity Tolerance Patient tolerated treatment well    Behavior During Therapy [facility] for tasks assessed/performed             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   Stroke [facility])     Past Surgical History:  Procedure Laterality Date   BUBBLE STUDY  10/17/2020   Procedure: BUBBLE STUDY;  Surgeon: Jake Bathe, MD;  [facility] ENDOSCOPY;  Service: Cardiovascular;;   IR ANGIO INTRA EXTRACRAN SEL COM CAROTID INNOMINATE UNI L MOD SED  10/11/2020   IR ANGIO VERTEBRAL SEL VERTEBRAL UNI L MOD SED  10/11/2020   IR CT HEAD LTD  10/11/2020   IR CT HEAD LTD  10/12/2020   IR PERCUTANEOUS ART THROMBECTOMY/INFUSION INTRACRANIAL INC DIAG ANGIO  10/11/2020   IR PERCUTANEOUS ART THROMBECTOMY/INFUSION INTRACRANIAL INC DIAG ANGIO  10/12/2020   NO PAST SURGERIES     PATENT FORAMEN OVALE(PFO) CLOSURE N/A 12/10/2020   Procedure: PATENT FORAMEN OVALE (PFO) CLOSURE;  Surgeon: Yates Decamp, MD;  [facility] INVAS[facility] CV LAB;  Service: Cardiovascular;  Laterality: N/A;   RADIOLOGY WITH ANESTHESIA N/A 10/12/2020   Procedure: RADIOLOGY WITH ANESTHESIA;  Surgeon: Radiologist, Medication, MD;  Location: [facility] OR;  Service: Radiology;   Laterality: N/A;   RADIOLOGY WITH ANESTHESIA N/A 10/11/2020   Procedure: IR WITH ANESTHESIA;  Surgeon: Radiologist, Medication, MD;  Location: [facility] OR;  Service: Radiology;  Laterality: N/A;   TEE WITHOUT CARDIOVERSION N/A 10/17/2020   Procedure: TRANSESOPHAGEAL ECHOCARDIOGRAM (TEE);  Surgeon: Jake Bathe, MD;  Location: [facility] ENDOSCOPY;  Service: Cardiovascular;  Laterality: N/A;    There were no vitals filed for this visit.   Subjective Assessment - 01/08/21 1113     Subjective  Pt reports that she continues to be really exhausted from work and is having second thoughts about taking on her new job at [employer] at the beginning of the year.    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    Limitations L sided hemiparesis, decreased balance, coordination, decreased indep with ADLs/IADLs/work/leisure    Patient Stated Goals Return to work and hold her 74 y/o son.    Pain Score 0-No pain    Pain Onset In the past 7 days            Occupational Therapy Treatment: Therapeutic Exercise: Facilitated BUE strengthening and increasing activity tolerance with use of Scifit at level 5 x8 min, alternating forward and reverse rotation every 2 min.  Pt intermittently verbalized that she needed to consciously avoid letting the R arm take over for the L as the R arm was getting fatigued.   Neuro re-ed: Facilitated increasing FMC, constructing purdue pegboard pieces with L hand, then construction using BUEs for a second trial.    Self Care: Practiced counting money and counting back change, working on speed with L hand and attention to task.  Pt lost count 1 time when dual tasking.  Practiced picking up coins from table, storing in palm, then discarding into slotted bank.    Response to Treatment: See Plan/clinical impression below.    OT Education - 01/08/21 1114     Education  Details coordination skills    Person(s) Educated Patient    Methods Explanation;Verbal cues    Comprehension Verbalized understanding;Verbal cues required;Need further instruction              OT Short Term Goals - 10/29/20 1247       OT SHORT TERM GOAL #1   Title Pt will perform HEP with min vc for improving LUE strength and coordination    Baseline Eval: initiated theraputty HEP at eval; further training needed    Time 6    Period Weeks    Status New    Target Date 12/08/20               OT Long Term Goals - 11/27/20 1622       OT LONG TERM GOAL #1   Title Pt will improve FOTO score to indicate improved functional performance.    Baseline 10th visit:  FOTO score: 77 Eval: incomplete FOTO, will complete next session    Time 12    Period Weeks    Status On-going    Target Date 01/19/21      OT LONG TERM GOAL #2   Title Pt will improve LUE strength by to enable pt to hold 5 y/o son and lift/carry heavy supplies for work and ADLs.    Baseline 1)th visit: UE strength is improving. Pt. is able to lift her son out of a shopping cart, however is unable to hold him for a duration.  Eval: Pt can not lift son (35-40 lbs).  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    Time 12    Period Weeks    Status On-going    Target Date 01/19/21      OT LONG TERM GOAL #3   Title Pt will improve LUE GMC/FMC skills to enable her to braid her own hair.    Baseline 10th visit: Pt. has not tried due to cutting her hair, however would like to keep this goal. Eval: Dependent to braid hair    Time 12    Period Weeks    Status On-going    Target Date 01/19/21      OT LONG TERM GOAL #4   Title Pt will improve L grip and pinch strength to enable her to open bottles and containers with modified indep.    Baseline 10th visit: Left grip strength: 25 lbs, lateral pInch strength: 11#, 3pt. pinch: 8#  pt. is able to open bottles. Eval: Requires assist to open  bottles and containers (L grip 20 lbs, R 56; L lateral pinch 10 lbs, R 16, L 3 point pinch 6 lbs, R 12.    Time 12    Period Weeks    Status On-going    Target Date 01/19/21  OT LONG TERM GOAL #5   Title Pt will improve BUE coordination to enable indep sweeping, making beds, and other light home making tasks.    Baseline 10th visit: Pt. is able to perfrom laundry, and dishes. Pt. requires assist for bedmaking tasks. Eval: assist needed with IADL tasks which require use of 2 hands.    Time 12    Period Weeks    Status On-going              Plan - 01/08/21 1127     Clinical Impression Statement Pt reports she is reconsidering starting her new job at [employer] at the beginning of the year, as she is still struggling with her activity tolerance at work, stating that she's only working 4-5 hours a day and this is very exhausting for her.  Pt continues to develop strength and activity tolerance, and is building L hand strength and coordination in order to manage work tasks efficiently, specifically counting money.  Pt continues to be challenged with dual tasking when counting money to simulate return to work environment.    OT Occupational Profile and History Detailed Assessment- Review of Records and additional review of physical, cognitive, psychosocial history related to current functional performance    Occupational performance deficits (Please refer to evaluation for details): ADL's;IADL's;Leisure;Work    Games developer / Function / Physical Skills ADL;Coordination;Endurance;UE functional use;Balance;Body mechanics;IADL;Dexterity;FMC;Strength;Mobility;ROM    Rehab Potential Excellent    Clinical Decision Making Several treatment options, min-mod task modification necessary    Comorbidities Affecting Occupational Performance: May have comorbidities impacting occupational performance    Modification or Assistance to Complete Evaluation  Min-Moderate modification of tasks or assist with  assess necessary to complete eval    OT Frequency 2x / week    OT Duration 12 weeks    OT Treatment/Interventions Self-care/ADL training;Therapeutic exercise;DME and/or AE instruction;Neuromuscular education;Manual Therapy;Passive range of motion;Therapeutic activities;Patient/family education    Consulted and Agree with Plan of Care Patient;Family member/caregiver             Patient will benefit from skilled therapeutic intervention in order to improve the following deficits and impairments:   Body Structure / Function / Physical Skills: ADL, Coordination, Endurance, UE functional use, Balance, Body mechanics, IADL, Dexterity, FMC, Strength, Mobility, ROM       Visit Diagnosis: Muscle weakness (generalized)  Other lack of coordination    Problem List Patient Active Problem List   Diagnosis Date Noted   History of stroke 12/16/2020   Depression, major, single episode, moderate (HCC) 12/16/2020   PFO (patent foramen ovale) 12/09/2020   Anemia    Hemiparesis affecting left side as late effect of stroke (HCC)    Leukocytosis    Dyslipidemia    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 [place], [place], [address] Phone: [phone]   Fax:  [phone]  Name: Kristen Grant MRN: [id number] Date of Birth: 01/01/1996

## 2021-01-10 NOTE — Progress Notes (Signed)
Established Patient Office Visit  Subjective:  Patient ID: Kristen Grant, female    DOB: 1995-11-03  Age: 25 y.o. MRN: [id number]  CC:  Chief Complaint  Patient presents with   Depression    HPI [place] presents for follow up on depression and anxiety.   ANXIETY/STRESS  She was started on zoloft daily and hydroxyzine prn panic attack last visit. She states that the zoloft helped with her depression some, however she noticed a decreased appetite and foods not tasting the same after starting it. She also stated that the hydroxyzine and zoloft did not help her anxiety.   Depression screen [facility] 2/9 01/13/2021 12/31/2020 12/16/2020 11/01/2020 10/06/2019  Decreased Interest '1 3 3 1 1  ' Down, Depressed, Hopeless '1 3 2 1 2  ' PHQ - 2 Score '2 6 5 2 3  ' Altered sleeping 1 - '3 3 3  ' Tired, decreased energy 3 - '3 3 3  ' Change in appetite 3 - '2 1 3  ' Feeling bad or failure about yourself  1 - '1 1 1  ' Trouble concentrating 1 - '1 1 2  ' Moving slowly or fidgety/restless 0 - 0 1 1  Suicidal thoughts 0 - 0 0 0  PHQ-9 Score 11 - '15 12 16  ' Difficult doing work/chores Extremely dIfficult - Extremely dIfficult Somewhat difficult Very difficult   GAD 7 : Generalized Anxiety Score 01/13/2021 12/16/2020  Nervous, Anxious, on Edge 3 3  Control/stop worrying 3 3  Worry too much - different things 3 3  Trouble relaxing 3 3  Restless 3 3  Easily annoyed or irritable 3 3  Afraid - awful might happen 3 3  Total GAD 7 Score 21 21  Anxiety Difficulty Extremely difficult Extremely difficult    Past Medical History:  Diagnosis Date   Anxiety    Asthma    no treatments required at this time   Depression    Migraine headache    Nicotine vapor product user 10/06/2019   Right middle cerebral artery stroke ([place]) 10/17/2020   Stroke [facility])     Past Surgical History:  Procedure Laterality Date   BUBBLE STUDY  10/17/2020   Procedure: BUBBLE STUDY;  Surgeon: Jerline Pain, MD;  Location: [place] ENDOSCOPY;   Service: Cardiovascular;;   IR ANGIO INTRA EXTRACRAN SEL COM CAROTID INNOMINATE UNI L MOD SED  10/11/2020   IR ANGIO VERTEBRAL SEL VERTEBRAL UNI L MOD SED  10/11/2020   IR CT HEAD LTD  10/11/2020   IR CT HEAD LTD  10/12/2020   IR PERCUTANEOUS ART THROMBECTOMY/INFUSION INTRACRANIAL INC DIAG ANGIO  10/11/2020   IR PERCUTANEOUS ART THROMBECTOMY/INFUSION INTRACRANIAL INC DIAG ANGIO  10/12/2020   NO PAST SURGERIES     PATENT FORAMEN OVALE(PFO) CLOSURE N/A 12/10/2020   Procedure: PATENT FORAMEN OVALE (PFO) CLOSURE;  Surgeon: Adrian Prows, MD;  Location: [place] CV LAB;  Service: Cardiovascular;  Laterality: N/A;   RADIOLOGY WITH ANESTHESIA N/A 10/12/2020   Procedure: RADIOLOGY WITH ANESTHESIA;  Surgeon: Radiologist, Medication, MD;  Location: [place];  Service: Radiology;  Laterality: N/A;   RADIOLOGY WITH ANESTHESIA N/A 10/11/2020   Procedure: IR WITH ANESTHESIA;  Surgeon: Radiologist, Medication, MD;  Location: [place];  Service: Radiology;  Laterality: N/A;   TEE WITHOUT CARDIOVERSION N/A 10/17/2020   Procedure: TRANSESOPHAGEAL ECHOCARDIOGRAM (TEE);  Surgeon: Jerline Pain, MD;  Location: [facility] ENDOSCOPY;  Service: Cardiovascular;  Laterality: N/A;    Family History  Problem Relation Age of Onset   Hypertension Mother  Epilepsy Mother    Anxiety disorder Mother    Depression Mother    Migraines Mother    Diabetes Maternal Grandmother    Stroke Maternal Grandmother    Hypertension Maternal Grandmother    Asthma Half-Brother    Asthma Half-Sister    Breast cancer Neg Hx     Social History   Socioeconomic History   Marital status: Single    Spouse name: Not on file   Number of children: 1   Years of education: Not on file   Highest education level: GED or equivalent  Occupational History   Not on file  Tobacco Use   Smoking status: Former    Types: E-cigarettes    Start date: 2021   Smokeless tobacco: Never  Vaping Use   Vaping Use: Former   Substances: Nicotine, THC, CBD  Substance and  Sexual Activity   Alcohol use: Not Currently   Drug use: Yes    Frequency: 3.0 times per week    Types: Marijuana    Comment: daily   Sexual activity: Not Currently    Partners: Male    Birth control/protection: Injection  Other Topics Concern   Not on file  Social History Narrative   Not on file   Social Determinants of Health   Financial Resource Strain: Not on file  Food Insecurity: Not on file  Transportation Needs: Not on file  Physical Activity: Not on file  Stress: Not on file  Social Connections: Not on file  Intimate Partner Violence: Not on file    Outpatient Medications Prior to Visit  Medication Sig Dispense Refill   aspirin 81 MG EC tablet Take 1 tablet (81 mg total) by mouth daily. Swallow whole. 100 tablet 0   atorvastatin (LIPITOR) 80 MG tablet Take 1 tablet (80 mg total) by mouth daily. 90 tablet 3   clopidogrel (PLAVIX) 75 MG tablet Take 1 tablet (75 mg total) by mouth daily. 90 tablet 0   hydrOXYzine (VISTARIL) 25 MG capsule Take 1 capsule (25 mg total) by mouth every 8 (eight) hours as needed. 30 capsule 0   topiramate (TOPAMAX) 25 MG tablet Take 1 tablet (25 mg total) by mouth 2 (two) times daily. (Patient taking differently: Take 12.5 mg by mouth daily.) 180 tablet 3   sertraline (ZOLOFT) 25 MG tablet Take 1 tablet (25 mg total) by mouth daily. 30 tablet 1   No facility-administered medications prior to visit.    No Known Allergies  ROS Review of Systems  Constitutional:  Positive for appetite change.  Respiratory: Negative.    Cardiovascular: Negative.   Gastrointestinal: Negative.   Genitourinary: Negative.   Musculoskeletal: Negative.   Skin: Negative.   Neurological: Negative.   Psychiatric/Behavioral:  Positive for dysphoric mood. The patient is nervous/anxious.      Objective:    Physical Exam Vitals and nursing note reviewed.  Constitutional:      General: She is not in acute distress.    Appearance: Normal appearance.  HENT:      Head: Normocephalic and atraumatic.  Eyes:     Conjunctiva/sclera: Conjunctivae normal.  Cardiovascular:     Rate and Rhythm: Normal rate and regular rhythm.     Pulses: Normal pulses.     Heart sounds: Normal heart sounds.  Pulmonary:     Effort: Pulmonary effort is normal.     Breath sounds: Normal breath sounds.  Musculoskeletal:     Cervical back: Normal range of motion.  Skin:    General:  Skin is warm and dry.  Neurological:     General: No focal deficit present.     Mental Status: She is alert and oriented to person, place, and time.  Psychiatric:        Mood and Affect: Mood normal.        Behavior: Behavior normal.        Thought Content: Thought content normal.        Judgment: Judgment normal.    BP 113/73   Pulse 87   Temp 98.4 F (36.9 C) (Oral)   Wt 201 lb 9.6 oz (91.4 kg)   SpO2 98%   BMI 31.11 kg/m  Wt Readings from Last 3 Encounters:  01/13/21 201 lb 9.6 oz (91.4 kg)  01/06/21 201 lb 3.2 oz (91.3 kg)  12/31/20 202 lb (91.6 kg)     Health Maintenance Due  Topic Date Due   HPV VACCINES (2 - 3-dose series) 02/10/2021       Topic Date Due   HPV VACCINES (2 - 3-dose series) 02/10/2021    No results found for: TSH Lab Results  Component Value Date   WBC 8.2 12/06/2020   HGB 13.0 12/06/2020   HCT 37.6 12/06/2020   MCV 84 12/06/2020   PLT 307 12/06/2020   Lab Results  Component Value Date   NA 139 12/06/2020   K 3.5 12/06/2020   CO2 22 12/06/2020   GLUCOSE 110 (H) 12/06/2020   BUN 6 12/06/2020   CREATININE 0.60 12/06/2020   BILITOT 2.2 (H) 11/13/2020   ALKPHOS 70 11/13/2020   AST 33 11/13/2020   ALT 21 11/13/2020   PROT 7.0 11/13/2020   ALBUMIN 3.8 11/13/2020   CALCIUM 9.3 12/06/2020   ANIONGAP 10 11/13/2020   EGFR 128 12/06/2020   Lab Results  Component Value Date   CHOL 109 12/03/2020   Lab Results  Component Value Date   HDL 36 (L) 12/03/2020   Lab Results  Component Value Date   LDLCALC 53 12/03/2020   Lab Results   Component Value Date   TRIG 110 12/03/2020   Lab Results  Component Value Date   CHOLHDL 3.0 12/03/2020   Lab Results  Component Value Date   HGBA1C 5.0 10/12/2020      Assessment & Plan:   Problem List Items Addressed This Visit       Other   Generalized anxiety disorder with panic attacks - Primary    With side effects from zoloft, will change to lexapro 52m daily. She was not experiencing much relief with hydroxyzine, will change to buspar 528mBID. Reached out to our referral coordinator to follow-up on psychiatry referral. Denies SI/HI. Will check TSH. Follow up in 4-6 weeks.       Relevant Medications   escitalopram (LEXAPRO) 10 MG tablet   busPIRone (BUSPAR) 5 MG tablet   Other Relevant Orders   TSH   Depression, major, single episode, moderate (HCC)    With side effects from zoloft, will change to lexapro 1065maily. She was not experiencing much relief with hydroxyzine, will change to buspar 5mg53mD. Reached out to our referral coordinator to follow-up on psychiatry referral. Denies SI/HI. Will check TSH. Follow up in 4-6 weeks.       Relevant Medications   escitalopram (LEXAPRO) 10 MG tablet   busPIRone (BUSPAR) 5 MG tablet   Other Visit Diagnoses     History of vitamin D deficiency       Check vitamin D levels today  and treat based on results   Relevant Orders   Vitamin D (25 hydroxy)   History of non anemic vitamin B12 deficiency       Check B12 levels today and treat based on results   Relevant Orders   Vitamin B12   Need for HPV vaccination       1st dose of HPV vaccine given today   Relevant Orders   HPV 9-valent vaccine,Recombinat (Completed)       Meds ordered this encounter  Medications   escitalopram (LEXAPRO) 10 MG tablet    Sig: Take 1 tablet (10 mg total) by mouth daily.    Dispense:  30 tablet    Refill:  1   busPIRone (BUSPAR) 5 MG tablet    Sig: Take 1 tablet (5 mg total) by mouth 2 (two) times daily.    Dispense:  60 tablet     Refill:  1     Follow-up: Return in about 4 weeks (around 02/10/2021) for 4-6 weeks for anxiety/depression with Dr. Neomia Dear.    Charyl Dancer, NP

## 2021-01-13 ENCOUNTER — Encounter: Payer: Self-pay | Admitting: Nurse Practitioner

## 2021-01-13 ENCOUNTER — Other Ambulatory Visit: Payer: Self-pay

## 2021-01-13 ENCOUNTER — Ambulatory Visit: Payer: Managed Care, Other (non HMO)

## 2021-01-13 ENCOUNTER — Ambulatory Visit (INDEPENDENT_AMBULATORY_CARE_PROVIDER_SITE_OTHER): Payer: Managed Care, Other (non HMO) | Admitting: Nurse Practitioner

## 2021-01-13 VITALS — BP 113/73 | HR 87 | Temp 98.4°F | Wt 201.6 lb

## 2021-01-13 DIAGNOSIS — M6281 Muscle weakness (generalized): Secondary | ICD-10-CM

## 2021-01-13 DIAGNOSIS — R269 Unspecified abnormalities of gait and mobility: Secondary | ICD-10-CM | POA: Diagnosis not present

## 2021-01-13 DIAGNOSIS — F321 Major depressive disorder, single episode, moderate: Secondary | ICD-10-CM

## 2021-01-13 DIAGNOSIS — R262 Difficulty in walking, not elsewhere classified: Secondary | ICD-10-CM

## 2021-01-13 DIAGNOSIS — Z23 Encounter for immunization: Secondary | ICD-10-CM

## 2021-01-13 DIAGNOSIS — Z8639 Personal history of other endocrine, nutritional and metabolic disease: Secondary | ICD-10-CM | POA: Diagnosis not present

## 2021-01-13 DIAGNOSIS — I63511 Cerebral infarction due to unspecified occlusion or stenosis of right middle cerebral artery: Secondary | ICD-10-CM

## 2021-01-13 DIAGNOSIS — R2689 Other abnormalities of gait and mobility: Secondary | ICD-10-CM

## 2021-01-13 DIAGNOSIS — R2681 Unsteadiness on feet: Secondary | ICD-10-CM

## 2021-01-13 DIAGNOSIS — R278 Other lack of coordination: Secondary | ICD-10-CM

## 2021-01-13 DIAGNOSIS — F411 Generalized anxiety disorder: Secondary | ICD-10-CM

## 2021-01-13 MED ORDER — BUSPIRONE HCL 5 MG PO TABS: 5.0000 mg | ORAL_TABLET | Freq: Two times a day (BID) | ORAL | 1 refills | Status: DC

## 2021-01-13 MED ORDER — ESCITALOPRAM OXALATE 10 MG PO TABS: 10.0000 mg | ORAL_TABLET | Freq: Every day | ORAL | 1 refills | Status: DC

## 2021-01-13 NOTE — Therapy (Signed)
[place] [facility] MAIN [facility] SERVICES [address] [place], [place], [address] Phone: [phone]   Fax:  [phone]  Physical Therapy Treatment  Patient Details  Name: Kristen Grant MRN: [id number] Date of Birth: 01/14/1996 Referring Provider (PT): Mariam Dollar   Encounter Date: 01/13/2021   PT End of Session - 01/13/21 1350     Visit Number 16    Number of Visits 25    Date for PT Re-Evaluation 01/20/21    Authorization Type [employer] Managed    Authorization Time Period 10/28/20-01/20/21    Progress Note Due on Visit 20    PT Start Time 1345    PT Stop Time 1425    PT Time Calculation (min) 40 min    Equipment Utilized During Treatment Gait belt    Activity Tolerance Patient tolerated treatment well;No increased pain    Behavior During Therapy WFL for tasks assessed/performed             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   Stroke [facility])     Past Surgical History:  Procedure Laterality Date   BUBBLE STUDY  10/17/2020   Procedure: BUBBLE STUDY;  Surgeon: Jake Bathe, MD;  Location: [facility]OSCOPY;  Service: Cardiovascular;;   IR ANGIO INTRA EXTRACRAN SEL COM CAROTID INNOMINATE UNI L MOD SED  10/11/2020   IR ANGIO VERTEBRAL SEL VERTEBRAL UNI L MOD SED  10/11/2020   IR CT HEAD LTD  10/11/2020   IR CT HEAD LTD  10/12/2020   IR PERCUTANEOUS ART THROMBECTOMY/INFUSION INTRACRANIAL INC DIAG ANGIO  10/11/2020   IR PERCUTANEOUS ART THROMBECTOMY/INFUSION INTRACRANIAL INC DIAG ANGIO  10/12/2020   NO PAST SURGERIES     PATENT FORAMEN OVALE(PFO) CLOSURE N/A 12/10/2020   Procedure: PATENT FORAMEN OVALE (PFO) CLOSURE;  Surgeon: Yates Decamp, MD;  [facility]: [facility] INVASIVE CV LAB;  Service: Cardiovascular;  Laterality: N/A;   RADIOLOGY WITH ANESTHESIA N/A 10/12/2020    Procedure: RADIOLOGY WITH ANESTHESIA;  Surgeon: Radiologist, Medication, MD;  Location: [facility] OR;  Service: Radiology;  Laterality: N/A;   RADIOLOGY WITH ANESTHESIA N/A 10/11/2020   Procedure: IR WITH ANESTHESIA;  Surgeon: Radiologist, Medication, MD;  Location: [facility] OR;  Service: Radiology;  Laterality: N/A;   TEE WITHOUT CARDIOVERSION N/A 10/17/2020   Procedure: TRANSESOPHAGEAL ECHOCARDIOGRAM (TEE);  Surgeon: Jake Bathe, MD;  Location: [facility] ENDOSCOPY;  Service: Cardiovascular;  Laterality: N/A;    There were no vitals filed for this visit.   Subjective Assessment - 01/13/21 1348     Subjective Pt doing well in general. Conitnues to work light duty ~25 hrs/week. She conitnues to feel progressively more tolerant of wrok duties but is tired and sore at the end of each day. She is still trying to consider her FT work options in the future.    Pertinent History Kristen Grant is a 25 year old right-handed female with morbid obesity BMI 29.92, anxiety, migraine headaches, tobacco use.  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  Cranial CT scan showed hypodensity in the right anterior temporal lobe concerning for infarct.  Pertaining to patient's right MCA infarct predominantly right caudate putamen anterior  limb right internal capsule secondary to right M1 occlusion status post revascularization mechanical thrombectomy requiring repeat thrombectomy 10/12/2020 for occluded dominant superior division of right MCA.    Currently in Pain? No/denies             INTERVENTION THIS DATE:  -Deadlifts 8lb- each arm- 3 sets of 10 reps (VC/TC for correct technique today)  *education hip hinge and isometric core stabilization with limited depth.   -STS from box hands free 12"x5, 12"+airex pad x6, x8 -education on single leg work at gym for equalizing  asymmetries  Well Zone education: knee extension machine, knee flexion machine, BUE row machine, single UE row on cable machine, chest press machine for BUE, incline bench for single UE chest press       PT Short Term Goals - 12/05/20 1332       PT SHORT TERM GOAL #1   Title Pt will be independent with HEP in order to improve strength and balance in order to decrease fall risk and improve function at home and work.    Baseline 10/28/2020- Patient has no formal HEP in place; 12/05/2020=Patient verbalized understanding of balance/walking HEP and states no problem or difficulty performing at this time    Time 6    Period Weeks    Status Achieved    Target Date 12/09/20               PT Long Term Goals - 12/05/20 1332       PT LONG TERM GOAL #1   Title Pt will improve FOTO to target score of 75 to  display perceived improvements in ability to complete ADL's.    Baseline 10/28/2020= 71; 12/05/2020= 74    Time 12    Period Weeks    Status On-going    Target Date 01/20/21      PT LONG TERM GOAL #2   Title Pt will decrease 5TSTS by at least 3 seconds in order to demonstrate clinically significant improvement in LE strength    Baseline 10/28/2020= 14.61 sec without UE support; 12/05/2020= 13.60 sec without UE support    Time 12    Period Weeks    Status On-going      PT LONG TERM GOAL #3   Title Patient will increase Functional Gait Assessment score to >26/30 as to reduce fall risk and improve dynamic gait safety with community ambulation.    Baseline 10/28/2020= 23/30; 12/05/2020= 30/30    Time 12    Period Weeks    Status Achieved                   Plan - 01/13/21 1351     Clinical Impression Statement Continued to work toward goals of treatment.Gym education performed, but ongoing. Left sided strength deficits estimated at ~30-50% based on 10RM testing in session. Guidance on single limb performance of exercises often bilateral at gym. Good tolerance overall, legs  sore at end of session.    Personal Factors and Comorbidities Comorbidity 1    Comorbidities CVA    Examination-Activity Limitations Caring for Others;Stairs    Examination-Participation Restrictions Community Activity;Occupation;Yard Work    Conservation officer, historic buildings Stable/Uncomplicated    Visual merchandiser    Rehab Potential Good    PT Frequency 2x / week    PT Duration 12 weeks    PT Treatment/Interventions ADLs/Self Care Home Management;Cryotherapy;Moist Heat;DME Instruction;Gait training;Stair training;Functional mobility training;Therapeutic activities;Therapeutic exercise;Balance training;Neuromuscular re-education;Patient/family education;Manual techniques;Passive range of motion;Dry needling  PT Next Visit Plan Heavy focus needed on isolated hip flexion and ankle DF strengthening (persistent deficit); LLE fine motor control training; DF strengthening on L, endurance    PT Home Exercise Plan 10/31/2020: Access Code: [email or web address]  (balance exercises); no updates    Consulted and Agree with Plan of Care Patient             Patient will benefit from skilled therapeutic intervention in order to improve the following deficits and impairments:  Abnormal gait, Decreased activity tolerance, Decreased balance, Decreased coordination, Decreased endurance, Cardiopulmonary status limiting activity, Decreased mobility, Decreased strength, Difficulty walking, Impaired UE functional use  Visit Diagnosis: Muscle weakness (generalized)  Other lack of coordination  Abnormality of gait and mobility  Difficulty in walking, not elsewhere classified  Unsteadiness on feet  Other abnormalities of gait and mobility  Right middle cerebral artery stroke [facility])     Problem List Patient Active Problem List   Diagnosis Date Noted   History of stroke 12/16/2020   Depression, major, single episode, moderate (HCC) 12/16/2020   PFO (patent foramen ovale) 12/09/2020   Anemia     Hemiparesis affecting left side as late effect of stroke (HCC)    Leukocytosis    Dyslipidemia    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 MAIN [facility] SERVICES [address] [place], [place], [address] Phone: [phone]   Fax:  [phone]  Name: Kristen Grant MRN: [id number] Date of Birth: 10-07-1995

## 2021-01-13 NOTE — Assessment & Plan Note (Addendum)
With side effects from zoloft, will change to lexapro 10mg  daily. She was not experiencing much relief with hydroxyzine, will change to buspar 5mg  BID. Reached out to our referral coordinator to follow-up on psychiatry referral. Denies SI/HI. Will check TSH. Follow up in 4-6 weeks.

## 2021-01-13 NOTE — Assessment & Plan Note (Signed)
With side effects from zoloft, will change to lexapro 10mg  daily. She was not experiencing much relief with hydroxyzine, will change to buspar 5mg  BID. Reached out to our referral coordinator to follow-up on psychiatry referral. Denies SI/HI. Will check TSH. Follow up in 4-6 weeks.

## 2021-01-13 NOTE — Patient Instructions (Addendum)
You can start over the counter vitamin D and B12 to help with energy.  Stop the zoloft Start lexapro 10mg  daily and buspar 5 mg twice a day We are checking your vitamin B12, vitamin D, and thyroid levels today We will follow up in 4-6 weeks, can be virtual

## 2021-01-14 LAB — VITAMIN D 25 HYDROXY (VIT D DEFICIENCY, FRACTURES): Vit D, 25-Hydroxy: 12.6 ng/mL — ABNORMAL LOW (ref 30.0–100.0)

## 2021-01-14 LAB — TSH: TSH: 0.821 u[IU]/mL (ref 0.450–4.500)

## 2021-01-14 LAB — VITAMIN B12: Vitamin B-12: 531 pg/mL (ref 232–1245)

## 2021-01-14 MED ORDER — VITAMIN D (ERGOCALCIFEROL) 1.25 MG (50000 UNIT) PO CAPS: 50000.0000 [IU] | ORAL_CAPSULE | ORAL | 0 refills | Status: DC

## 2021-01-14 NOTE — Therapy (Signed)
[facility] MAIN [facility] SERVICES [address] [place], [place], [address] Phone: [phone]   Fax:  [phone]  Occupational Therapy Treatment  Patient Details  Name: Kristen Grant MRN: [id number] Date of Birth: 10-Oct-1995 Referring Provider (OT): Mariam Dollar, [place]   Encounter Date: 01/13/2021   OT End of Session - 01/14/21 1410     Visit Number 18    Number of Visits 24    Date for OT Re-Evaluation 01/19/21    Authorization Type Progress report period starting 11/27/2020    OT Start Time 1300    OT Stop Time 1345    OT Time Calculation (min) 45 min    Activity Tolerance Patient tolerated treatment well    Behavior During Therapy [facility] for tasks assessed/performed             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   Stroke [facility])     Past Surgical History:  Procedure Laterality Date   BUBBLE STUDY  10/17/2020   Procedure: BUBBLE STUDY;  Surgeon: Jake Bathe, MD;  [facility]: [facility] ENDOSCOPY;  Service: Cardiovascular;;   IR ANGIO INTRA EXTRACRAN SEL COM CAROTID INNOMINATE UNI L MOD SED  10/11/2020   IR ANGIO VERTEBRAL SEL VERTEBRAL UNI L MOD SED  10/11/2020   IR CT HEAD LTD  10/11/2020   IR CT HEAD LTD  10/12/2020   IR PERCUTANEOUS ART THROMBECTOMY/INFUSION INTRACRANIAL INC DIAG ANGIO  10/11/2020   IR PERCUTANEOUS ART THROMBECTOMY/INFUSION INTRACRANIAL INC DIAG ANGIO  10/12/2020   NO PAST SURGERIES     PATENT FORAMEN OVALE(PFO) CLOSURE N/A 12/10/2020   Procedure: PATENT FORAMEN OVALE (PFO) CLOSURE;  Surgeon: Yates Decamp, MD;  [facility]: [facility] INVASIVE CV LAB;  Service: Cardiovascular;  Laterality: N/A;   RADIOLOGY WITH ANESTHESIA N/A 10/12/2020   Procedure: RADIOLOGY WITH ANESTHESIA;  Surgeon: Radiologist, Medication, MD;  Location: [facility] OR;  Service: Radiology;   Laterality: N/A;   RADIOLOGY WITH ANESTHESIA N/A 10/11/2020   Procedure: IR WITH ANESTHESIA;  Surgeon: Radiologist, Medication, MD;  Location: [facility] OR;  Service: Radiology;  Laterality: N/A;   TEE WITHOUT CARDIOVERSION N/A 10/17/2020   Procedure: TRANSESOPHAGEAL ECHOCARDIOGRAM (TEE);  Surgeon: Jake Bathe, MD;  Location: [facility] ENDOSCOPY;  Service: Cardiovascular;  Laterality: N/A;    There were no vitals filed for this visit.   Subjective Assessment - 01/13/21 1409     Subjective  "It's still hard to carry things in my L arm."    Patient is accompanied by: Family member    Pertinent History Recent R MCA infarct on 10/11/20, underwent  mechanical thrombectomy on 10/11/20, a second time on 10/12/20.    Limitations L sided hemiparesis, decreased balance, coordination, decreased indep with ADLs/IADLs/work/leisure    Patient Stated Goals Return to work and hold her 56 y/o son.    Currently in Pain? No/denies    Pain Score 0-No pain    Pain Onset In the past 7 days            Occupational Therapy Treatment: Therapeutic Exercise: Completed Scifit 8 min level 5, alternating forward and reverse rotation every 2 min.  Pt reported good use of L side to equally rotate pedals, if not using LUE more so than R today.   Neuro  re-ed: Facilitated 2 point and 3 point pinch strengthening with L hand snapping/unsnapping small beads.  Occasional slipping of bead from L hand digits when trying to pinch and stabilize.  Dexterity skills addressed with placement of small plastic beads onto pegboard using L hand, then removed with thin tweezers, wide tweezers, and scissor/tweezers.  Practiced picking up beads, storage of beads in palm of hand, and discarding beads one a time, working to improve small object manipulation with L hand.   Response to Treatment: Pt continues to develop LUE strength and activity tolerance, and is building L hand strength and coordination in order to manage work tasks efficiently, specifically  increasing dexterity for counting money at work.  Pt will continue to benefit from skilled OT to address skills noted above in order to maximize efficiency with home and work tasks.     OT Education - 01/13/21 1410     Education Details coordination skills    Person(s) Educated Patient    Methods Explanation;Verbal cues    Comprehension Verbalized understanding;Verbal cues required;Need further instruction              OT Short Term Goals - 10/29/20 1247       OT SHORT TERM GOAL #1   Title Pt will perform HEP with min vc for improving LUE strength and coordination    Baseline Eval: initiated theraputty HEP at eval; further training needed    Time 6    Period Weeks    Status New    Target Date 12/08/20               OT Long Term Goals - 11/27/20 1622       OT LONG TERM GOAL #1   Title Pt will improve FOTO score to indicate improved functional performance.    Baseline 10th visit:  FOTO score: 77 Eval: incomplete FOTO, will complete next session    Time 12    Period Weeks    Status On-going    Target Date 01/19/21      OT LONG TERM GOAL #2   Title Pt will improve LUE strength by to enable pt to hold 5 y/o son and lift/carry heavy supplies for work and ADLs.    Baseline 1)th visit: UE strength is improving. Pt. is able to lift her son out of a shopping cart, however is unable to hold him for a duration.  Eval: Pt can not lift son (35-40 lbs).  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    Time 12    Period Weeks    Status On-going    Target Date 01/19/21      OT LONG TERM GOAL #3   Title Pt will improve LUE GMC/FMC skills to enable her to braid her own hair.    Baseline 10th visit: Pt. has not tried due to cutting her hair, however would like to keep this goal. Eval: Dependent to braid hair    Time 12    Period Weeks    Status On-going    Target Date 01/19/21      OT LONG TERM GOAL #4   Title Pt will improve L grip and pinch  strength to enable her to open bottles and containers with modified indep.    Baseline 10th visit: Left grip strength: 25 lbs, lateral pInch strength: 11#, 3pt. pinch: 8#  pt. is able to open bottles. Eval: Requires assist to open bottles and containers (L grip  20 lbs, R 56; L lateral pinch 10 lbs, R 16, L 3 point pinch 6 lbs, R 12.    Time 12    Period Weeks    Status On-going    Target Date 01/19/21      OT LONG TERM GOAL #5   Title Pt will improve BUE coordination to enable indep sweeping, making beds, and other light home making tasks.    Baseline 10th visit: Pt. is able to perfrom laundry, and dishes. Pt. requires assist for bedmaking tasks. Eval: assist needed with IADL tasks which require use of 2 hands.    Time 12    Period Weeks    Status On-going              Plan - 01/13/21 1423     Clinical Impression Statement Pt continues to develop LUE strength and activity tolerance, and is building L hand strength and coordination in order to manage work tasks efficiently, specifically increasing dexterity for counting money at work.  Pt will continue to benefit from skilled OT to address skills noted above in order to maximize efficiency with home and work tasks.    OT Occupational Profile and History Detailed Assessment- Review of Records and additional review of physical, cognitive, psychosocial history related to current functional performance    Occupational performance deficits (Please refer to evaluation for details): ADL's;IADL's;Leisure;Work    Games developer / Function / Physical Skills ADL;Coordination;Endurance;UE functional use;Balance;Body mechanics;IADL;Dexterity;FMC;Strength;Mobility;ROM    Rehab Potential Excellent    Clinical Decision Making Several treatment options, min-mod task modification necessary    Comorbidities Affecting Occupational Performance: May have comorbidities impacting occupational performance    Modification or Assistance to Complete Evaluation   Min-Moderate modification of tasks or assist with assess necessary to complete eval    OT Frequency 2x / week    OT Duration 12 weeks    OT Treatment/Interventions Self-care/ADL training;Therapeutic exercise;DME and/or AE instruction;Neuromuscular education;Manual Therapy;Passive range of motion;Therapeutic activities;Patient/family education    Consulted and Agree with Plan of Care Patient;Family member/caregiver             Patient will benefit from skilled therapeutic intervention in order to improve the following deficits and impairments:   Body Structure / Function / Physical Skills: ADL, Coordination, Endurance, UE functional use, Balance, Body mechanics, IADL, Dexterity, FMC, Strength, Mobility, ROM       Visit Diagnosis: Muscle weakness (generalized)  Other lack of coordination    Problem List Patient Active Problem List   Diagnosis Date Noted   History of stroke 12/16/2020   Depression, major, single episode, moderate (HCC) 12/16/2020   PFO (patent foramen ovale) 12/09/2020   Anemia    Hemiparesis affecting left side as late effect of stroke (HCC)    Leukocytosis    Dyslipidemia    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

## 2021-01-14 NOTE — Addendum Note (Signed)
Addended by: Rodman Pickle A on: 01/14/2021 08:00 AM   Modules accepted: Orders

## 2021-01-15 ENCOUNTER — Encounter: Payer: Managed Care, Other (non HMO) | Admitting: Occupational Therapy

## 2021-01-15 ENCOUNTER — Encounter: Payer: Self-pay | Admitting: Nurse Practitioner

## 2021-01-15 ENCOUNTER — Ambulatory Visit: Payer: Managed Care, Other (non HMO)

## 2021-01-20 ENCOUNTER — Other Ambulatory Visit: Payer: Self-pay

## 2021-01-20 ENCOUNTER — Ambulatory Visit: Payer: Managed Care, Other (non HMO) | Admitting: Occupational Therapy

## 2021-01-20 ENCOUNTER — Ambulatory Visit: Payer: Managed Care, Other (non HMO)

## 2021-01-20 ENCOUNTER — Encounter: Payer: Self-pay | Admitting: Occupational Therapy

## 2021-01-20 DIAGNOSIS — R262 Difficulty in walking, not elsewhere classified: Secondary | ICD-10-CM

## 2021-01-20 DIAGNOSIS — R2681 Unsteadiness on feet: Secondary | ICD-10-CM

## 2021-01-20 DIAGNOSIS — M6281 Muscle weakness (generalized): Secondary | ICD-10-CM

## 2021-01-20 DIAGNOSIS — R269 Unspecified abnormalities of gait and mobility: Secondary | ICD-10-CM | POA: Diagnosis not present

## 2021-01-20 NOTE — Therapy (Signed)
[place] MAIN [facility] SERVICES [address] [place], [place], [address] Phone: [phone]   Fax:  [phone]  Occupational Therapy Treatment/Recertification/Discharge Note  Patient Details  Name: Kristen Grant MRN: [id number] Date of Birth: [birth year] Referring Provider (OT): Lauraine Rinne, [place]   Encounter Date: 01/20/2021   OT End of Session - 01/20/21 1518     Visit Number 19    Number of Visits 24    Date for OT Re-Evaluation 01/20/21    Authorization Type Progress report period starting 11/27/2020    OT Start Time 1345    OT Stop Time 1415    OT Time Calculation (min) 30 min    Activity Tolerance Patient tolerated treatment well    Behavior During Therapy [facility] for tasks assessed/performed             Past Medical History:  Diagnosis Date   Anxiety    Asthma    no treatments required at this time   Depression    Migraine headache    Nicotine vapor product user 10/06/2019   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   Stroke [facility])     Past Surgical History:  Procedure Laterality Date   BUBBLE STUDY  10/17/2020   Procedure: BUBBLE STUDY;  Surgeon: Jerline Pain, MD;  Location: [place];  Service: Cardiovascular;;   IR ANGIO INTRA EXTRACRAN SEL COM CAROTID INNOMINATE UNI L MOD SED  10/11/2020   IR ANGIO VERTEBRAL SEL VERTEBRAL UNI L MOD SED  10/11/2020   IR CT HEAD LTD  10/11/2020   IR CT HEAD LTD  10/12/2020   IR PERCUTANEOUS ART THROMBECTOMY/INFUSION INTRACRANIAL INC DIAG ANGIO  10/11/2020   IR PERCUTANEOUS ART THROMBECTOMY/INFUSION INTRACRANIAL INC DIAG ANGIO  10/12/2020   NO PAST SURGERIES     PATENT FORAMEN OVALE(PFO) CLOSURE N/A 12/10/2020   Procedure: PATENT FORAMEN OVALE (PFO) CLOSURE;  Surgeon: Adrian Prows, MD;  Location: [place] CV LAB;  Service: Cardiovascular;  Laterality: N/A;   RADIOLOGY WITH ANESTHESIA N/A 10/12/2020   Procedure: RADIOLOGY WITH ANESTHESIA;  Surgeon: Radiologist, Medication, MD;   Location: [place];  Service: Radiology;  Laterality: N/A;   RADIOLOGY WITH ANESTHESIA N/A 10/11/2020   Procedure: IR WITH ANESTHESIA;  Surgeon: Radiologist, Medication, MD;  Location: [place];  Service: Radiology;  Laterality: N/A;   TEE WITHOUT CARDIOVERSION N/A 10/17/2020   Procedure: TRANSESOPHAGEAL ECHOCARDIOGRAM (TEE);  Surgeon: Jerline Pain, MD;  Location: [facility] ENDOSCOPY;  Service: Cardiovascular;  Laterality: N/A;    There were no vitals filed for this visit.   Subjective Assessment - 01/20/21 1350     Subjective  Pt. reports that she is gradually increasing the amount of hours she is working. "    Patient is accompanied by: Family member    Pertinent History Recent R MCA infarct on 10/11/20, underwent  mechanical thrombectomy on 10/11/20, a second time on 10/12/20.    Limitations L sided hemiparesis, decreased balance, coordination, decreased indep with ADLs/IADLs/work/leisure    Currently in Pain? No/denies                [facility] OT Assessment - 01/20/21 1351       Coordination   Left 9 Hole Peg Test 26      AROM   Overall AROM Comments Left shoulder flexion 155      Strength   Overall Strength Comments Left shoulder flexion 5/5, abduction: 5/5, elbow flexion 5/5, wrist extension 5/5      Hand  Function   Left Hand Grip (lbs) 34    Left Hand Lateral Pinch 16 lbs    Left 3 point pinch 13 lbs            Measurements were obtained, and goals were reviewed with the pt. Pt. Made excellent progress with LUE ROM, strength, grip strength, pinch strength, and [place] skills. Pt. Is now able to use her left hand during ADL, and IADL tasks including home management tasks, lifting items, carrying items, and opening items. Pt. has met her goals, and has now returned to work for 25-30 hours. Pt. FOTO score is 82. TR score: 60. Pt. Is now appropriate for discharge for OT services.                     OT Short Term Goals - 01/20/21 1520       OT SHORT TERM GOAL #1    Baseline Discharge: Independent with HEPs. Eval: initiated theraputty HEP at eval; further training needed    Time 6    Period Weeks    Status Achieved               OT Long Term Goals - 01/20/21 1405       OT LONG TERM GOAL #1   Title Pt will improve FOTO score to indicate improved functional performance.    Baseline Discharge: 82. 10th visit:  FOTO score: 60 Eval: incomplete FOTO, will complete next session    Time 12    Period Weeks    Status Achieved    Target Date 01/19/21      OT LONG TERM GOAL #2   Title Pt will improve LUE strength by 2MM to enable pt to hold 64 y/o son and lift/carry heavy supplies for work and ADLs.    Baseline Discharge: Pt. has improved LUE strength, and is able to carry heavier items at work. 10th visit: UE strength is improving. Pt. is able to lift her son out of a shopping cart, however is unable to hold him for a duration.  Eval: Pt can not lift son (35-40 lbs).  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    Time 12    Status Achieved    Target Date 01/19/21      OT LONG TERM GOAL #3   Title Pt will improve LUE GMC/FMC skills to enable her to braid her own hair.    Baseline Discharge: Pt. has cut her hair, but is able to put her hair into a ponytail. 10th visit: Pt. has not tried due to cutting her hair, however would like to keep this goal. Eval: Dependent to braid hair    Time 12    Period Weeks    Status Achieved      OT LONG TERM GOAL #4   Title Pt will improve L grip and pinch strength to enable her to open bottles and containers with modified indep.    Baseline Discharge: Pt. is able to open jars. 10th visit: Left grip strength: 25 lbs, lateral pInch strength: 11#, 3pt. pinch: 8#  pt. is able to open bottles. Eval: Requires assist to open bottles and containers (L grip 20 lbs, R 56; L lateral pinch 10 lbs, R 16, L 3 point pinch 6 lbs, R 12.    Time 12    Period Weeks    Status Achieved      OT LONG TERM GOAL  #5  Title Pt will improve BUE coordination to enable indep sweeping, making beds, and other light home making tasks.    Baseline Discharge: Pt. is able now able to sweep however using the dust pan is awkward. Independent with home management tasks. . 10th visit: Pt. is able to perfrom laundry, and dishes. Pt. requires assist for bedmaking tasks. Eval: assist needed with IADL tasks which require use of 2 hands.    Time 12    Period Weeks    Status Achieved                   Plan - 01/20/21 1517     Clinical Impression Statement Measurements were obtained, and goals were reviewed with the pt. Pt. Made excellent progress with LUE ROM, strength, grip strength, pinch strength, and [place] skills. Pt. Is now able to use her left hand during ADL, and IADL tasks including home management tasks, lifting items, carrying items, and opening items. Pt. has met her goals, and has now returned to work for 25-30 hours. Pt. FOTO score is 82. TR score: 60. Pt. Is now appropriate for discharge for OT services.      OT Occupational Profile and History Detailed Assessment- Review of Records and additional review of physical, cognitive, psychosocial history related to current functional performance    Occupational performance deficits (Please refer to evaluation for details): ADL's;IADL's;Leisure;Work    Marketing executive / Function / Physical Skills ADL;Coordination;Endurance;UE functional use;Balance;Body mechanics;IADL;Dexterity;FMC;Strength;Mobility;ROM    Rehab Potential Excellent    Clinical Decision Making Several treatment options, min-mod task modification necessary    Comorbidities Affecting Occupational Performance: May have comorbidities impacting occupational performance    Modification or Assistance to Complete Evaluation  Min-Moderate modification of tasks or assist with assess necessary to complete eval    OT Frequency 2x / week    OT Duration 12 weeks    OT Treatment/Interventions Self-care/ADL  training;Therapeutic exercise;DME and/or AE instruction;Neuromuscular education;Manual Therapy;Passive range of motion;Therapeutic activities;Patient/family education    Consulted and Agree with Plan of Care Patient;Family member/caregiver             Patient will benefit from skilled therapeutic intervention in order to improve the following deficits and impairments:   Body Structure / Function / Physical Skills: ADL, Coordination, Endurance, UE functional use, Balance, Body mechanics, IADL, Dexterity, FMC, Strength, Mobility, ROM       Visit Diagnosis: Muscle weakness (generalized)    Problem List Patient Active Problem List   Diagnosis Date Noted   History of stroke 12/16/2020   Depression, major, single episode, moderate (HCC) 12/16/2020   PFO (patent foramen ovale) 12/09/2020   Anemia    Hemiparesis affecting left side as late effect of stroke (HCC)    Leukocytosis    Dyslipidemia    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

## 2021-01-20 NOTE — Therapy (Signed)
[place] MAIN [facility] SERVICES [address] [place], [place], [address] Phone: [phone]   Fax:  [phone]  Physical Therapy Treatment/PT Discharge summary  Patient Details  Name: Kristen Grant MRN: [id number] Date of Birth: March 29, 1995 Referring Provider (PT): Lauraine Rinne   Encounter Date: 01/20/2021   PT End of Session - 01/20/21 1434     Visit Number 17    Number of Visits 25    Date for PT Re-Evaluation 01/20/21    Authorization Type [employer] Managed    Authorization Time Period 10/28/20-01/20/21    Progress Note Due on Visit 20    PT Start Time 1429    PT Stop Time 1500    PT Time Calculation (min) 31 min    Equipment Utilized During Treatment Gait belt    Activity Tolerance Patient tolerated treatment well;No increased pain    Behavior During Therapy WFL for tasks assessed/performed             Past Medical History:  Diagnosis Date   Anxiety    Asthma    no treatments required at this time   Depression    Migraine headache    Nicotine vapor product user 10/06/2019   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   Stroke [facility])     Past Surgical History:  Procedure Laterality Date   BUBBLE STUDY  10/17/2020   Procedure: BUBBLE STUDY;  Surgeon: Jerline Pain, MD;  Location: [place] ENDOSCOPY;  Service: Cardiovascular;;   IR ANGIO INTRA EXTRACRAN SEL COM CAROTID INNOMINATE UNI L MOD SED  10/11/2020   IR ANGIO VERTEBRAL SEL VERTEBRAL UNI L MOD SED  10/11/2020   IR CT HEAD LTD  10/11/2020   IR CT HEAD LTD  10/12/2020   IR PERCUTANEOUS ART THROMBECTOMY/INFUSION INTRACRANIAL INC DIAG ANGIO  10/11/2020   IR PERCUTANEOUS ART THROMBECTOMY/INFUSION INTRACRANIAL INC DIAG ANGIO  10/12/2020   NO PAST SURGERIES     PATENT FORAMEN OVALE(PFO) CLOSURE N/A 12/10/2020   Procedure: PATENT FORAMEN OVALE (PFO) CLOSURE;  Surgeon: Adrian Prows, MD;  Location: [place] CV LAB;  Service: Cardiovascular;  Laterality: N/A;   RADIOLOGY WITH ANESTHESIA  N/A 10/12/2020   Procedure: RADIOLOGY WITH ANESTHESIA;  Surgeon: Radiologist, Medication, MD;  Location: [place];  Service: Radiology;  Laterality: N/A;   RADIOLOGY WITH ANESTHESIA N/A 10/11/2020   Procedure: IR WITH ANESTHESIA;  Surgeon: Radiologist, Medication, MD;  Location: [place];  Service: Radiology;  Laterality: N/A;   TEE WITHOUT CARDIOVERSION N/A 10/17/2020   Procedure: TRANSESOPHAGEAL ECHOCARDIOGRAM (TEE);  Surgeon: Jerline Pain, MD;  Location: [facility] ENDOSCOPY;  Service: Cardiovascular;  Laterality: N/A;    There were no vitals filed for this visit.   Subjective Assessment - 01/20/21 1432     Subjective Pt doing well and working  up to 6 hours- still tired and having some ankle pain after working any length of time.    Pertinent History Kristen Grant is a 25 year old right-handed female with morbid obesity BMI 29.92, anxiety, migraine headaches, tobacco use.  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 Presented 10/11/2020 with acute onset of left-sided weakness, dysarthria and facial droop.  Cranial CT scan showed hypodensity in the right anterior temporal lobe concerning for infarct.  Pertaining to patient's right MCA infarct predominantly right caudate putamen anterior limb right internal capsule secondary to right M1 occlusion status post revascularization mechanical thrombectomy requiring repeat thrombectomy 10/12/2020 for occluded dominant  superior division of right MCA.    Currently in Pain? No/denies              INTERVENTIONS:   Patient reported that she was doing well but still unsure about her new work status so she did not want to schedule any more visits and content with PT discharge visit today and to reach out to her MD if any further PT warranted in the future. She reported that she was so thankful to everyone and believes she is doing much better.   Reassessed remaining LTGs  FOTO= 98 (improved  from 74) - goal met  5xSTS= 11.03 sec (improved from 14.61 initially) without UE Support   Patient reported some ankle soreness after working so reviewed some ankle strengthening:   Resistive Ankle DF/PF/EV with blue TB x 12 reps each. Patient able to verbalize and demonstrate correct technique.   Reviewed using steps as able to perform calf raises  and discussed continuing with some of her dynamic Balance exercises- Tandem, SLS to focus on ankle stabilization.   She performed toe/heel raises using 1/2 foam x 12 reps each. Education provided throughout session via VC/TC and demonstration to facilitate movement at target joints and correct muscle activation for all testing and exercises performed.                           PT Education - 01/20/21 1434     Education Details Exercise technique/finalized HEP for Ankle strengthening/stabilization.    Person(s) Educated Patient    Methods Explanation;Demonstration;Tactile cues;Verbal cues    Comprehension Verbalized understanding;Returned demonstration;Verbal cues required;Tactile cues required;Need further instruction              PT Short Term Goals - 12/05/20 1332       PT SHORT TERM GOAL #1   Title Pt will be independent with HEP in order to improve strength and balance in order to decrease fall risk and improve function at home and work.    Baseline 10/28/2020- Patient has no formal HEP in place; 12/05/2020=Patient verbalized understanding of balance/walking HEP and states no problem or difficulty performing at this time    Time 6    Period Weeks    Status Achieved    Target Date 12/09/20               PT Long Term Goals - 01/20/21 1505       PT LONG TERM GOAL #1   Title Pt will improve FOTO to target score of 75 to  display perceived improvements in ability to complete ADL's.    Baseline 10/28/2020= 71; 12/05/2020= 74; 01/20/2021= 98    Time 12    Period Weeks    Status Achieved    Target Date  01/20/21      PT LONG TERM GOAL #2   Title Pt will decrease 5TSTS by at least 3 seconds in order to demonstrate clinically significant improvement in LE strength    Baseline 10/28/2020= 14.61 sec without UE support; 12/05/2020= 13.60 sec without UE support; 01/20/2021= 11.03 sec without UE support    Time 12    Period Weeks    Status Achieved      PT LONG TERM GOAL #3   Title Patient will increase Functional Gait Assessment score to >26/30 as to reduce fall risk and improve dynamic gait safety with community ambulation.    Baseline 10/28/2020= 23/30; 12/05/2020= 30/30    Time 12  Period Weeks    Status Achieved                   Plan - 01/20/21 1517     Clinical Impression Statement Patient has vastly improved her overall mobility and balance since initiating PT services. She reports and demonstrated independence with all mobility with no reported falls and has returned to working 30 hours/week. She improved with FOTO indicating improved self perception of ability and demonstrated improvement in LE strength as seen by meeting 5xSTS goal and previously scoring perfect 30/30 on FGA back in November. Patient is appropriate for discharge today with all goals met and to continue on her own with current HEP designed for LE strengthening/balance. Patient in agreement with plan for discharge today.    Personal Factors and Comorbidities Comorbidity 1    Comorbidities CVA    Examination-Activity Limitations Caring for Others;Stairs    Examination-Participation Restrictions Community Activity;Occupation;Yard Work    Stability/Clinical Decision Making Stable/Uncomplicated    Rehab Potential Good    PT Frequency 2x / week    PT Duration 12 weeks    PT Treatment/Interventions ADLs/Self Care Home Management;Cryotherapy;Moist Heat;DME Instruction;Gait training;Stair training;Functional mobility training;Therapeutic activities;Therapeutic exercise;Balance training;Neuromuscular  re-education;Patient/family education;Manual techniques;Passive range of motion;Dry needling    PT Next Visit Plan DIscharge today with goals met and plan for patient to continue on her own with current HEP    PT Home Exercise Plan 10/31/2020: Access Code: [email or web address]  (balance exercises); no updates    Consulted and Agree with Plan of Care Patient             Patient will benefit from skilled therapeutic intervention in order to improve the following deficits and impairments:  Abnormal gait, Decreased activity tolerance, Decreased balance, Decreased coordination, Decreased endurance, Cardiopulmonary status limiting activity, Decreased mobility, Decreased strength, Difficulty walking, Impaired UE functional use  Visit Diagnosis: Abnormality of gait and mobility  Difficulty in walking, not elsewhere classified  Muscle weakness (generalized)  Unsteadiness on feet     Problem List Patient Active Problem List   Diagnosis Date Noted   History of stroke 12/16/2020   Depression, major, single episode, moderate ([place]) 12/16/2020   PFO (patent foramen ovale) 12/09/2020   Anemia    Hemiparesis affecting left side as late effect of stroke (HCC)    Leukocytosis    Dyslipidemia    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 [place], [place], [address] Phone: [phone]   Fax:  [phone]  Name: JADEN BATCHELDER MRN: [id number] Date of Birth: January 07, 1996

## 2021-01-20 NOTE — Patient Outreach (Signed)
[facility]) Care Management  01/20/2021  [place] WELCOME 18-May-1995 [id number]   Telephone outreach to patient to obtain mRS was successfully completed. MRS= 1   Vanice Sarah [facility] Care Management Assistant

## 2021-01-23 ENCOUNTER — Ambulatory Visit: Payer: Managed Care, Other (non HMO)

## 2021-01-23 ENCOUNTER — Encounter: Payer: Managed Care, Other (non HMO) | Admitting: Occupational Therapy

## 2021-01-28 ENCOUNTER — Encounter: Payer: Managed Care, Other (non HMO) | Admitting: Occupational Therapy

## 2021-01-28 ENCOUNTER — Ambulatory Visit: Payer: Managed Care, Other (non HMO)

## 2021-01-30 ENCOUNTER — Ambulatory Visit: Payer: Managed Care, Other (non HMO)

## 2021-01-30 ENCOUNTER — Encounter: Payer: Managed Care, Other (non HMO) | Admitting: Occupational Therapy

## 2021-02-02 DIAGNOSIS — A749 Chlamydial infection, unspecified: Secondary | ICD-10-CM

## 2021-02-02 HISTORY — DX: Chlamydial infection, unspecified: A74.9

## 2021-02-04 ENCOUNTER — Encounter: Payer: Managed Care, Other (non HMO) | Admitting: Occupational Therapy

## 2021-02-04 ENCOUNTER — Ambulatory Visit: Payer: Managed Care, Other (non HMO)

## 2021-02-06 ENCOUNTER — Ambulatory Visit: Payer: Managed Care, Other (non HMO)

## 2021-02-10 ENCOUNTER — Other Ambulatory Visit: Payer: Self-pay

## 2021-02-10 ENCOUNTER — Encounter: Payer: Self-pay | Admitting: Nurse Practitioner

## 2021-02-10 ENCOUNTER — Other Ambulatory Visit: Payer: Self-pay | Admitting: Nurse Practitioner

## 2021-02-10 ENCOUNTER — Ambulatory Visit (INDEPENDENT_AMBULATORY_CARE_PROVIDER_SITE_OTHER): Payer: Managed Care, Other (non HMO) | Admitting: Nurse Practitioner

## 2021-02-10 VITALS — BP 113/66 | HR 77 | Temp 98.7°F | Wt 204.6 lb

## 2021-02-10 DIAGNOSIS — F411 Generalized anxiety disorder: Secondary | ICD-10-CM

## 2021-02-10 DIAGNOSIS — F321 Major depressive disorder, single episode, moderate: Secondary | ICD-10-CM

## 2021-02-10 MED ORDER — VENLAFAXINE HCL ER 37.5 MG PO CP24: 37.5000 mg | ORAL_CAPSULE | Freq: Every day | ORAL | 0 refills | Status: DC

## 2021-02-10 NOTE — Telephone Encounter (Signed)
Requested medication (s) are due for refill today Yes  Requested medication (s) are on the active medication list Yes  Future visit scheduled   Yes for 02/24/21   Note to clinic-noted on 02/10/21 they patient is not taking. 02/10/21 OV note reads to continue PRN. Routing to provider for clarification.           Requested Prescriptions  Pending Prescriptions Disp Refills   hydrOXYzine (VISTARIL) 25 MG capsule [Pharmacy Med Name: hydrOXYzine PAM 25 MG CAP] 30 capsule 0    Sig: TAKE ONE CAPSULE BY MOUTH EVERY 8 HOURS AS NEEDED     Ear, Nose, and Throat:  Antihistamines Passed - 02/10/2021 12:17 PM      Passed - Valid encounter within last 12 months    Recent Outpatient Visits           Today Depression, major, single episode, moderate (HCC)   [facility] [place], Clydie Braun, NP   4 weeks ago Generalized anxiety disorder with panic attacks   [facility] [place], Lauren A, NP   1 month ago Generalized anxiety disorder with panic attacks   [facility] [place], Jake Church, NP       Future Appointments             In 2 weeks Larae Grooms, NP [facility]   In 11 months Yates Decamp, MD [facility]

## 2021-02-10 NOTE — Assessment & Plan Note (Addendum)
Chronic. Uncontrolled. Will change Lexapro and Buspar to Effexor 37.5mg .  Side effects and benefits of medication. Continue with PRN Hydroxyzine.  Follow up in 2 weeks for reevaluation.  Patient received new patient paperwork for psychiatry. Needs to fill it out to make an appt.  Patient is overwhelmed with the paperwork. Discussed setting a small goal each day to do part of the paperwork with completion date being our follow up date in 2 weeks.

## 2021-02-10 NOTE — Progress Notes (Signed)
Established Patient Office Visit  Subjective:  Patient ID: Kristen Grant, female    DOB: 07-09-1995  Age: 26 y.o. MRN: [id number]  CC:  Chief Complaint  Patient presents with   Depression   Anxiety    HPI [place] presents for follow up on depression and anxiety.   ANXIETY/STRESS  Patient states she hasn't been taking her medications lately because she wasn't able to get the pill bottle open after her stroke.  She didn't feel like the Lexapro and Buspar were helping at all.  States she couldn't tell a difference when she was taking them.  States she was still having chest pain from anxiety.  The hydroxyzine was helping her the most.    Depression screen [facility] 2/9 02/10/2021 01/13/2021 12/31/2020 12/16/2020 11/01/2020  Decreased Interest _0 Down, Depressed, Hopeless _1 PHQ - 2 Score _2 Altered sleeping 3 1 - 3 3  Tired, decreased energy 3 3 - 3 3  Change in appetite 3 3 - 2 1  Feeling bad or failure about yourself  0 1 - 1 1  Trouble concentrating 2 1 - 1 1  Moving slowly or fidgety/restless 2 0 - 0 1  Suicidal thoughts 0 0 - 0 0  PHQ-9 Score 17 11 - 15 12  Difficult doing work/chores Extremely dIfficult Extremely dIfficult - Extremely dIfficult Somewhat difficult   GAD 7 : Generalized Anxiety Score 02/10/2021 01/13/2021 12/16/2020  Nervous, Anxious, on Edge _3 Control/stop worrying _4 Worry too much - different things _5 Trouble relaxing _6 Restless _7 Easily annoyed or irritable _8 Afraid - awful might happen _9 Total GAD 7 Score _10 Anxiety Difficulty Extremely difficult Extremely difficult Extremely difficult    Past Medical History:  Diagnosis Date   Anxiety    Asthma    no treatments required at this time   Depression    Migraine headache    Nicotine vapor product user 10/06/2019   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   Stroke [facility])     Past Surgical History:  Procedure Laterality  Date   BUBBLE STUDY  10/17/2020   Procedure: BUBBLE STUDY;  Surgeon: Jerline Pain, MD;  Location: [place];  Service: Cardiovascular;;   IR ANGIO INTRA EXTRACRAN SEL COM CAROTID INNOMINATE UNI L MOD SED  10/11/2020   IR ANGIO VERTEBRAL SEL VERTEBRAL UNI L MOD SED  10/11/2020   IR CT HEAD LTD  10/11/2020   IR CT HEAD LTD  10/12/2020   IR PERCUTANEOUS ART THROMBECTOMY/INFUSION INTRACRANIAL INC DIAG ANGIO  10/11/2020   IR PERCUTANEOUS ART THROMBECTOMY/INFUSION INTRACRANIAL INC DIAG ANGIO  10/12/2020   NO PAST SURGERIES     PATENT FORAMEN OVALE(PFO) CLOSURE N/A 12/10/2020   Procedure: PATENT FORAMEN OVALE (PFO) CLOSURE;  Surgeon: Adrian Prows, MD;  Location: [place] CV LAB;  Service: Cardiovascular;  Laterality: N/A;   RADIOLOGY WITH ANESTHESIA N/A 10/12/2020   Procedure: RADIOLOGY WITH ANESTHESIA;  Surgeon: Radiologist, Medication, MD;  Location: [place];  Service: Radiology;  Laterality: N/A;   RADIOLOGY WITH ANESTHESIA N/A 10/11/2020   Procedure: IR WITH ANESTHESIA;  Surgeon: Radiologist, Medication, MD;  Location: [place];  Service: Radiology;  Laterality: N/A;   TEE WITHOUT CARDIOVERSION N/A 10/17/2020   Procedure: TRANSESOPHAGEAL ECHOCARDIOGRAM (TEE);  Surgeon: Marlou Porch,  Thana Farr, MD;  Location: [place] ENDOSCOPY;  Service: Cardiovascular;  Laterality: N/A;    Family History  Problem Relation Age of Onset   Hypertension Mother    Epilepsy Mother    Anxiety disorder Mother    Depression Mother    Migraines Mother    Diabetes Maternal Grandmother    Stroke Maternal Grandmother    Hypertension Maternal Grandmother    Asthma Half-Brother    Asthma Half-Sister    Breast cancer Neg Hx     Social History   Socioeconomic History   Marital status: Single    Spouse name: Not on file   Number of children: 1   Years of education: Not on file   Highest education level: GED or equivalent  Occupational History   Not on file  Tobacco Use   Smoking status: Former    Types: E-cigarettes    Start date:  2021   Smokeless tobacco: Never  Vaping Use   Vaping Use: Former   Substances: Nicotine, THC, CBD  Substance and Sexual Activity   Alcohol use: Not Currently   Drug use: Yes    Frequency: 3.0 times per week    Types: Marijuana    Comment: daily   Sexual activity: Not Currently    Partners: Male    Birth control/protection: Injection  Other Topics Concern   Not on file  Social History Narrative   Not on file   Social Determinants of Health   Financial Resource Strain: Not on file  Food Insecurity: Not on file  Transportation Needs: Not on file  Physical Activity: Not on file  Stress: Not on file  Social Connections: Not on file  Intimate Partner Violence: Not on file    Outpatient Medications Prior to Visit  Medication Sig Dispense Refill   atorvastatin (LIPITOR) 80 MG tablet Take 1 tablet (80 mg total) by mouth daily. (Patient not taking: Reported on 02/10/2021) 90 tablet 3   clopidogrel (PLAVIX) 75 MG tablet Take 1 tablet (75 mg total) by mouth daily. (Patient not taking: Reported on 02/10/2021) 90 tablet 0   hydrOXYzine (VISTARIL) 25 MG capsule Take 1 capsule (25 mg total) by mouth every 8 (eight) hours as needed. (Patient not taking: Reported on 02/10/2021) 30 capsule 0   Vitamin D, Ergocalciferol, (DRISDOL) 1.25 MG (50000 UNIT) CAPS capsule Take 1 capsule (50,000 Units total) by mouth every 7 (seven) days. (Patient not taking: Reported on 02/10/2021) 12 capsule 0   busPIRone (BUSPAR) 5 MG tablet Take 1 tablet (5 mg total) by mouth 2 (two) times daily. (Patient not taking: Reported on 02/10/2021) 60 tablet 1   escitalopram (LEXAPRO) 10 MG tablet Take 1 tablet (10 mg total) by mouth daily. (Patient not taking: Reported on 02/10/2021) 30 tablet 1   topiramate (TOPAMAX) 25 MG tablet Take 1 tablet (25 mg total) by mouth 2 (two) times daily. (Patient not taking: Reported on 02/10/2021) 180 tablet 3   No facility-administered medications prior to visit.    No Known Allergies  ROS Review  of Systems  Respiratory: Negative.    Cardiovascular: Negative.   Gastrointestinal: Negative.   Genitourinary: Negative.   Musculoskeletal: Negative.   Skin: Negative.   Neurological: Negative.   Psychiatric/Behavioral:  Positive for dysphoric mood. Negative for suicidal ideas. The patient is nervous/anxious.      Objective:    Physical Exam Vitals and nursing note reviewed.  Constitutional:      General: She is not in acute distress.    Appearance:  Normal appearance.  HENT:     Head: Normocephalic and atraumatic.  Eyes:     Conjunctiva/sclera: Conjunctivae normal.  Cardiovascular:     Rate and Rhythm: Normal rate and regular rhythm.     Pulses: Normal pulses.     Heart sounds: Normal heart sounds.  Pulmonary:     Effort: Pulmonary effort is normal.     Breath sounds: Normal breath sounds.  Musculoskeletal:     Cervical back: Normal range of motion.  Skin:    General: Skin is warm and dry.  Neurological:     General: No focal deficit present.     Mental Status: She is alert and oriented to person, place, and time.  Psychiatric:        Mood and Affect: Mood normal.        Behavior: Behavior normal.        Thought Content: Thought content normal.        Judgment: Judgment normal.    BP 113/66    Pulse 77    Temp 98.7 F (37.1 C) (Oral)    Wt 204 lb 9.6 oz (92.8 kg)    LMP  (LMP Unknown)    SpO2 98%    BMI 31.57 kg/m  Wt Readings from Last 3 Encounters:  02/10/21 204 lb 9.6 oz (92.8 kg)  01/13/21 201 lb 9.6 oz (91.4 kg)  01/06/21 201 lb 3.2 oz (91.3 kg)     Health Maintenance Due  Topic Date Due   HPV VACCINES (2 - 3-dose series) 02/10/2021       Topic Date Due   HPV VACCINES (2 - 3-dose series) 02/10/2021    Lab Results  Component Value Date   TSH 0.821 01/13/2021   Lab Results  Component Value Date   WBC 8.2 12/06/2020   HGB 13.0 12/06/2020   HCT 37.6 12/06/2020   MCV 84 12/06/2020   PLT 307 12/06/2020   Lab Results  Component Value Date    NA 139 12/06/2020   K 3.5 12/06/2020   CO2 22 12/06/2020   GLUCOSE 110 (H) 12/06/2020   BUN 6 12/06/2020   CREATININE 0.60 12/06/2020   BILITOT 2.2 (H) 11/13/2020   ALKPHOS 70 11/13/2020   AST 33 11/13/2020   ALT 21 11/13/2020   PROT 7.0 11/13/2020   ALBUMIN 3.8 11/13/2020   CALCIUM 9.3 12/06/2020   ANIONGAP 10 11/13/2020   EGFR 128 12/06/2020   Lab Results  Component Value Date   CHOL 109 12/03/2020   Lab Results  Component Value Date   HDL 36 (L) 12/03/2020   Lab Results  Component Value Date   LDLCALC 53 12/03/2020   Lab Results  Component Value Date   TRIG 110 12/03/2020   Lab Results  Component Value Date   CHOLHDL 3.0 12/03/2020   Lab Results  Component Value Date   HGBA1C 5.0 10/12/2020      Assessment & Plan:   Problem List Items Addressed This Visit       Other   Generalized anxiety disorder with panic attacks    Chronic. Uncontrolled. Will change Lexapro and Buspar to Effexor 37.75m.  Side effects and benefits of medication. Continue with PRN Hydroxyzine.  Follow up in 2 weeks for reevaluation.      Relevant Medications   venlafaxine XR (EFFEXOR XR) 37.5 MG 24 hr capsule   Depression, major, single episode, moderate (HCC) - Primary    Chronic. Uncontrolled. Will change Lexapro and Buspar to Effexor 37.565m  Side effects and benefits of medication. Continue with PRN Hydroxyzine.  Follow up in 2 weeks for reevaluation.  Patient received new patient paperwork for psychiatry. Needs to fill it out to make an appt.  Patient is overwhelmed with the paperwork. Discussed setting a small goal each day to do part of the paperwork with completion date being our follow up date in 2 weeks.       Relevant Medications   venlafaxine XR (EFFEXOR XR) 37.5 MG 24 hr capsule    Meds ordered this encounter  Medications   venlafaxine XR (EFFEXOR XR) 37.5 MG 24 hr capsule    Sig: Take 1 capsule (37.5 mg total) by mouth daily with breakfast.    Dispense:  30 capsule     Refill:  0    Order Specific Question:   Supervising Provider    AnswerValerie Roys [[id number]]     Follow-up: Return in about 2 weeks (around 02/24/2021) for Depression/Anxiety FU.    Jon Billings, NP

## 2021-02-10 NOTE — Assessment & Plan Note (Addendum)
Chronic. Uncontrolled. Will change Lexapro and Buspar to Effexor 37.5mg .  Side effects and benefits of medication. Continue with PRN Hydroxyzine.  Follow up in 2 weeks for reevaluation.

## 2021-02-11 ENCOUNTER — Ambulatory Visit: Payer: Managed Care, Other (non HMO) | Admitting: Physical Therapy

## 2021-02-13 ENCOUNTER — Ambulatory Visit: Payer: Managed Care, Other (non HMO) | Admitting: Physical Therapy

## 2021-02-18 ENCOUNTER — Ambulatory Visit: Payer: Managed Care, Other (non HMO) | Admitting: Physical Therapy

## 2021-02-20 ENCOUNTER — Ambulatory Visit: Payer: Managed Care, Other (non HMO) | Admitting: Physical Therapy

## 2021-02-21 NOTE — Progress Notes (Signed)
Established Patient Office Visit  Subjective:  Patient ID: Kristen Grant, female    DOB: 1995-08-25  Age: 26 y.o. MRN: [id number]  CC:  Chief Complaint  Patient presents with   Depression   Anxiety   Shoulder Pain    Patient states her left shoulder began to hurt yesterday, otc tylenol did not help     HPI Joeanna L Emory presents for follow up on depression and anxiety.   ANXIETY/STRESS Patient states she feels like the medication is really helping.  She feels like she has been more positive lately and has noticed a difference with taking the medication. She would like to increase the dose of medication.   Depression screen [facility] 2/9 02/24/2021 02/10/2021 01/13/2021 12/31/2020 12/16/2020  Decreased Interest '1 3 1 3 3  ' Down, Depressed, Hopeless 0 '1 1 3 2  ' PHQ - 2 Score '1 4 2 6 5  ' Altered sleeping '3 3 1 ' - 3  Tired, decreased energy '3 3 3 ' - 3  Change in appetite '2 3 3 ' - 2  Feeling bad or failure about yourself  0 0 1 - 1  Trouble concentrating '1 2 1 ' - 1  Moving slowly or fidgety/restless 2 2 0 - 0  Suicidal thoughts 0 0 0 - 0  PHQ-9 Score '12 17 11 ' - 15  Difficult doing work/chores - Extremely dIfficult Extremely dIfficult - Extremely dIfficult   GAD 7 : Generalized Anxiety Score 02/24/2021 02/10/2021 01/13/2021 12/16/2020  Nervous, Anxious, on Edge '3 3 3 3  ' Control/stop worrying '3 3 3 3  ' Worry too much - different things '3 3 3 3  ' Trouble relaxing '3 3 3 3  ' Restless '2 3 3 3  ' Easily annoyed or irritable '3 3 3 3  ' Afraid - awful might happen '1 3 3 3  ' Total GAD 7 Score '18 21 21 21  ' Anxiety Difficulty Extremely difficult Extremely difficult Extremely difficult Extremely difficult   SHOULDER PAIN Duration: days- started yesterday Involved shoulder: left Mechanism of injury: unknown Location: posterior Onset:sudden Severity: severe  Quality:  sharp and shooting Frequency: constant Radiation: no Aggravating factors: movement  Alleviating factors:  tylenol    Status: worse Treatments attempted:  tylenol   Relief with NSAIDs?:  No NSAIDs Taken Weakness: no Numbness: no Decreased grip strength: no Redness: no Swelling: no Bruising: no Fevers: no   Past Medical History:  Diagnosis Date   Anxiety    Asthma    no treatments required at this time   Depression    Migraine headache    Nicotine vapor product user 10/06/2019   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   Stroke [facility])     Past Surgical History:  Procedure Laterality Date   BUBBLE STUDY  10/17/2020   Procedure: BUBBLE STUDY;  Surgeon: Jerline Pain, MD;  Location: [place];  Service: Cardiovascular;;   IR ANGIO INTRA EXTRACRAN SEL COM CAROTID INNOMINATE UNI L MOD SED  10/11/2020   IR ANGIO VERTEBRAL SEL VERTEBRAL UNI L MOD SED  10/11/2020   IR CT HEAD LTD  10/11/2020   IR CT HEAD LTD  10/12/2020   IR PERCUTANEOUS ART THROMBECTOMY/INFUSION INTRACRANIAL INC DIAG ANGIO  10/11/2020   IR PERCUTANEOUS ART THROMBECTOMY/INFUSION INTRACRANIAL INC DIAG ANGIO  10/12/2020   NO PAST SURGERIES     PATENT FORAMEN OVALE(PFO) CLOSURE N/A 12/10/2020   Procedure: PATENT FORAMEN OVALE (PFO) CLOSURE;  Surgeon: Adrian Prows, MD;  Location: [place] CV LAB;  Service: Cardiovascular;  Laterality:  N/A;   RADIOLOGY WITH ANESTHESIA N/A 10/12/2020   Procedure: RADIOLOGY WITH ANESTHESIA;  Surgeon: Radiologist, Medication, MD;  Location: [place];  Service: Radiology;  Laterality: N/A;   RADIOLOGY WITH ANESTHESIA N/A 10/11/2020   Procedure: IR WITH ANESTHESIA;  Surgeon: Radiologist, Medication, MD;  Location: [place];  Service: Radiology;  Laterality: N/A;   TEE WITHOUT CARDIOVERSION N/A 10/17/2020   Procedure: TRANSESOPHAGEAL ECHOCARDIOGRAM (TEE);  Surgeon: Jerline Pain, MD;  Location: [facility] ENDOSCOPY;  Service: Cardiovascular;  Laterality: N/A;    Family History  Problem Relation Age of Onset   Hypertension Mother    Epilepsy Mother    Anxiety disorder Mother    Depression Mother    Migraines Mother     Diabetes Maternal Grandmother    Stroke Maternal Grandmother    Hypertension Maternal Grandmother    Asthma Half-Brother    Asthma Half-Sister    Breast cancer Neg Hx     Social History   Socioeconomic History   Marital status: Single    Spouse name: Not on file   Number of children: 1   Years of education: Not on file   Highest education level: GED or equivalent  Occupational History   Not on file  Tobacco Use   Smoking status: Former    Types: E-cigarettes    Start date: 2021   Smokeless tobacco: Never  Vaping Use   Vaping Use: Every day   Substances: Nicotine, THC, CBD  Substance and Sexual Activity   Alcohol use: Not Currently   Drug use: Yes    Frequency: 3.0 times per week    Types: Marijuana    Comment: daily   Sexual activity: Not Currently    Partners: Male  Other Topics Concern   Not on file  Social History Narrative   Not on file   Social Determinants of Health   Financial Resource Strain: Not on file  Food Insecurity: Not on file  Transportation Needs: Not on file  Physical Activity: Not on file  Stress: Not on file  Social Connections: Not on file  Intimate Partner Violence: Not on file    Outpatient Medications Prior to Visit  Medication Sig Dispense Refill   atorvastatin (LIPITOR) 80 MG tablet Take 1 tablet (80 mg total) by mouth daily. 90 tablet 3   clopidogrel (PLAVIX) 75 MG tablet Take 1 tablet (75 mg total) by mouth daily. 90 tablet 0   Vitamin D, Ergocalciferol, (DRISDOL) 1.25 MG (50000 UNIT) CAPS capsule Take 1 capsule (50,000 Units total) by mouth every 7 (seven) days. 12 capsule 0   hydrOXYzine (VISTARIL) 25 MG capsule TAKE ONE CAPSULE BY MOUTH EVERY 8 HOURS AS NEEDED 30 capsule 0   venlafaxine XR (EFFEXOR XR) 37.5 MG 24 hr capsule Take 1 capsule (37.5 mg total) by mouth daily with breakfast. 30 capsule 0   No facility-administered medications prior to visit.    No Known Allergies  ROS Review of Systems  Respiratory: Negative.     Cardiovascular: Negative.   Gastrointestinal: Negative.   Genitourinary: Negative.   Musculoskeletal:  Positive for neck pain.  Skin: Negative.   Neurological: Negative.   Psychiatric/Behavioral:  Positive for dysphoric mood. Negative for suicidal ideas. The patient is nervous/anxious.      Objective:    Physical Exam Vitals and nursing note reviewed.  Constitutional:      General: She is not in acute distress.    Appearance: Normal appearance.  HENT:     Head: Normocephalic and atraumatic.  Eyes:     Conjunctiva/sclera: Conjunctivae normal.  Cardiovascular:     Rate and Rhythm: Normal rate and regular rhythm.     Pulses: Normal pulses.     Heart sounds: Normal heart sounds.  Pulmonary:     Effort: Pulmonary effort is normal.     Breath sounds: Normal breath sounds.  Musculoskeletal:       Arms:     Cervical back: Normal range of motion.  Skin:    General: Skin is warm and dry.  Neurological:     General: No focal deficit present.     Mental Status: She is alert and oriented to person, place, and time.  Psychiatric:        Mood and Affect: Mood normal.        Behavior: Behavior normal.        Thought Content: Thought content normal.        Judgment: Judgment normal.    BP 116/81    Pulse (!) 105    Temp 97.6 F (36.4 C)    Wt 204 lb 12.8 oz (92.9 kg)    LMP  (LMP Unknown)    SpO2 99%    BMI 31.60 kg/m  Wt Readings from Last 3 Encounters:  02/24/21 204 lb 12.8 oz (92.9 kg)  02/10/21 204 lb 9.6 oz (92.8 kg)  01/13/21 201 lb 9.6 oz (91.4 kg)     Health Maintenance Due  Topic Date Due   HPV VACCINES (2 - 3-dose series) 02/10/2021       Topic Date Due   HPV VACCINES (2 - 3-dose series) 02/10/2021    Lab Results  Component Value Date   TSH 0.821 01/13/2021   Lab Results  Component Value Date   WBC 8.2 12/06/2020   HGB 13.0 12/06/2020   HCT 37.6 12/06/2020   MCV 84 12/06/2020   PLT 307 12/06/2020   Lab Results  Component Value Date   NA 139  12/06/2020   K 3.5 12/06/2020   CO2 22 12/06/2020   GLUCOSE 110 (H) 12/06/2020   BUN 6 12/06/2020   CREATININE 0.60 12/06/2020   BILITOT 2.2 (H) 11/13/2020   ALKPHOS 70 11/13/2020   AST 33 11/13/2020   ALT 21 11/13/2020   PROT 7.0 11/13/2020   ALBUMIN 3.8 11/13/2020   CALCIUM 9.3 12/06/2020   ANIONGAP 10 11/13/2020   EGFR 128 12/06/2020   Lab Results  Component Value Date   CHOL 109 12/03/2020   Lab Results  Component Value Date   HDL 36 (L) 12/03/2020   Lab Results  Component Value Date   LDLCALC 53 12/03/2020   Lab Results  Component Value Date   TRIG 110 12/03/2020   Lab Results  Component Value Date   CHOLHDL 3.0 12/03/2020   Lab Results  Component Value Date   HGBA1C 5.0 10/12/2020      Assessment & Plan:   Problem List Items Addressed This Visit       Other   Generalized anxiety disorder with panic attacks   Relevant Medications   hydrOXYzine (VISTARIL) 50 MG capsule   venlafaxine XR (EFFEXOR XR) 75 MG 24 hr capsule   Depression, major, single episode, moderate (HCC)    Chronic.  Improved from previous visit.  Will increase Effexor to 60m daily. Return to clinic in 1 months for reevaluation.  Call sooner if concerns arise.         Relevant Medications   hydrOXYzine (VISTARIL) 50 MG capsule   venlafaxine  XR (EFFEXOR XR) 75 MG 24 hr capsule   Other Visit Diagnoses     Neck pain    -  Primary   Likely related to muscle spasm. Will give toradol in office. Prednisone given, complete the course. Cervical neck stretches given to help with muscle spasm.   Relevant Medications   ketorolac (TORADOL) injection 60 mg (Start on 02/24/2021 11:30 AM)      Meds ordered this encounter  Medications   hydrOXYzine (VISTARIL) 50 MG capsule    Sig: Take 1 capsule (50 mg total) by mouth 3 (three) times daily as needed.    Dispense:  30 capsule    Refill:  1    Order Specific Question:   Supervising Provider    Answer:   Valerie Roys [[id number]]    venlafaxine XR (EFFEXOR XR) 75 MG 24 hr capsule    Sig: Take 1 capsule (75 mg total) by mouth daily with breakfast.    Dispense:  90 capsule    Refill:  1    Order Specific Question:   Supervising Provider    Answer:   Valerie Roys [[id number]]   ketorolac (TORADOL) injection 60 mg   predniSONE (DELTASONE) 10 MG tablet    Sig: Take 1 tablet (10 mg total) by mouth daily with breakfast. Take 6 today, 5 tomorrow, and decrease by 1 each day until course is complete.    Dispense:  21 tablet    Refill:  0    Order Specific Question:   Supervising Provider    Answer:   Valerie Roys [[id number]]     Follow-up: Return in about 1 month (around 03/27/2021) for Depression/Anxiety FU.    Jon Billings, NP

## 2021-02-24 ENCOUNTER — Other Ambulatory Visit: Payer: Self-pay

## 2021-02-24 ENCOUNTER — Ambulatory Visit (INDEPENDENT_AMBULATORY_CARE_PROVIDER_SITE_OTHER): Payer: Managed Care, Other (non HMO) | Admitting: Nurse Practitioner

## 2021-02-24 ENCOUNTER — Encounter: Payer: Self-pay | Admitting: Nurse Practitioner

## 2021-02-24 VITALS — BP 116/81 | HR 105 | Temp 97.6°F | Wt 204.8 lb

## 2021-02-24 DIAGNOSIS — F321 Major depressive disorder, single episode, moderate: Secondary | ICD-10-CM | POA: Diagnosis not present

## 2021-02-24 DIAGNOSIS — F411 Generalized anxiety disorder: Secondary | ICD-10-CM

## 2021-02-24 DIAGNOSIS — M542 Cervicalgia: Secondary | ICD-10-CM

## 2021-02-24 MED ORDER — KETOROLAC TROMETHAMINE 60 MG/2ML IM SOLN
60.0000 mg | Freq: Once | INTRAMUSCULAR | Status: AC
  Administered 2021-02-24: 60 mg via INTRAMUSCULAR

## 2021-02-24 MED ORDER — HYDROXYZINE PAMOATE 50 MG PO CAPS: 50.0000 mg | ORAL_CAPSULE | Freq: Three times a day (TID) | ORAL | 1 refills | Status: DC | PRN

## 2021-02-24 MED ORDER — PREDNISONE 10 MG PO TABS: 10.0000 mg | ORAL_TABLET | Freq: Every day | ORAL | 0 refills | Status: DC

## 2021-02-24 MED ORDER — VENLAFAXINE HCL ER 75 MG PO CP24: 75.0000 mg | ORAL_CAPSULE | Freq: Every day | ORAL | 1 refills | Status: DC

## 2021-02-24 NOTE — Assessment & Plan Note (Signed)
Chronic.  Improved from previous visit.  Will increase Effexor to 75mg  daily. Return to clinic in 1 months for reevaluation.  Call sooner if concerns arise.

## 2021-02-25 ENCOUNTER — Ambulatory Visit: Payer: Managed Care, Other (non HMO)

## 2021-02-27 ENCOUNTER — Ambulatory Visit: Payer: Managed Care, Other (non HMO) | Admitting: Physical Therapy

## 2021-02-28 ENCOUNTER — Ambulatory Visit: Payer: Managed Care, Other (non HMO) | Admitting: Physical Medicine & Rehabilitation

## 2021-03-04 ENCOUNTER — Ambulatory Visit: Payer: Managed Care, Other (non HMO)

## 2021-03-06 ENCOUNTER — Ambulatory Visit: Payer: Managed Care, Other (non HMO) | Admitting: Physical Therapy

## 2021-03-06 ENCOUNTER — Encounter: Payer: Self-pay | Admitting: Nurse Practitioner

## 2021-03-06 MED ORDER — CYCLOBENZAPRINE HCL 10 MG PO TABS: 10.0000 mg | ORAL_TABLET | Freq: Every day | ORAL | 0 refills | Status: DC

## 2021-03-11 ENCOUNTER — Ambulatory Visit: Payer: Managed Care, Other (non HMO) | Admitting: Physical Therapy

## 2021-03-13 ENCOUNTER — Ambulatory Visit: Payer: Managed Care, Other (non HMO) | Admitting: Physical Therapy

## 2021-03-18 ENCOUNTER — Ambulatory Visit: Payer: Managed Care, Other (non HMO) | Admitting: Physical Therapy

## 2021-03-20 ENCOUNTER — Ambulatory Visit: Payer: Managed Care, Other (non HMO) | Admitting: Physical Therapy

## 2021-03-21 ENCOUNTER — Encounter: Payer: Self-pay | Admitting: Physical Medicine & Rehabilitation

## 2021-03-21 ENCOUNTER — Other Ambulatory Visit: Payer: Self-pay

## 2021-03-21 ENCOUNTER — Encounter
Payer: Managed Care, Other (non HMO) | Attending: Physical Medicine & Rehabilitation | Admitting: Physical Medicine & Rehabilitation

## 2021-03-21 ENCOUNTER — Ambulatory Visit: Payer: Self-pay | Admitting: Psychiatry

## 2021-03-21 VITALS — BP 105/69 | HR 102 | Ht 67.5 in | Wt 207.0 lb

## 2021-03-21 DIAGNOSIS — I63511 Cerebral infarction due to unspecified occlusion or stenosis of right middle cerebral artery: Secondary | ICD-10-CM | POA: Diagnosis not present

## 2021-03-21 NOTE — Progress Notes (Signed)
Subjective:    Patient ID: Kristen Grant, female    DOB: 1995/10/31, 26 y.o.   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  Patient is approximately 5 months post infarct.  She has followed up with neurology.  Some coordination deficit s and weakness in LUE.  Minimal numbness in LLE toes but no pain , no strength  issues except some foot slap, which occurs mainly when patient is fatiguing. Patient back to work and driving.  She has had no issues with return to work.  She decided she would work only at [employer] and not at [employer] as well. Pain Inventory Average Pain 8 Pain Right Now 0 My pain is intermittent, tingling, and spasms  LOCATION OF PAIN  left side of neck, left shoulder, left hand, left foot / toes  BOWEL Number of stools per week: 4-5 Oral laxative use No   BLADDER Normal In and out cath, frequency N/A Able to self cath No  Bladder incontinence No  Frequent urination No  Leakage with coughing  Difficulty starting stream Yes  Incomplete bladder emptying Yes    Mobility ability to climb steps?  yes do you drive?  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  no  Physicians involved in your care Any changes since last visit?  no   Family History  Problem Relation Age of Onset   Hypertension Mother    Epilepsy Mother    Anxiety disorder Mother    Depression Mother    Migraines Mother    Diabetes Maternal Grandmother    Stroke Maternal Grandmother    Hypertension Maternal Grandmother    Asthma Half-Brother    Asthma Half-Sister    Breast cancer Neg Hx    Social History   Socioeconomic History   Marital status: Single    Spouse name: Not on file   Number of children: 1   Years of education: Not on file   Highest education level: GED  or equivalent  Occupational History   Not on file  Tobacco Use   Smoking status: Former    Types: E-cigarettes    Start date: 2021   Smokeless tobacco: Never  Vaping Use   Vaping Use: Every day   Substances: Nicotine, THC  Substance and Sexual Activity   Alcohol use: Not Currently   Drug use: Yes    Frequency: 3.0 times per week    Types: Marijuana    Comment: daily   Sexual activity: Not Currently    Partners: Male  Other Topics Concern   Not on file  Social History Narrative   Not on file   Social Determinants of Health   Financial Resource Strain: Not on file  Food Insecurity: Not on file  Transportation Needs: Not on file  Physical Activity: Not on file  Stress: Not on file  Social Connections: Not on file   Past Surgical History:  Procedure Laterality Date   BUBBLE STUDY  10/17/2020   Procedure: BUBBLE STUDY;  Surgeon: Jake Bathe, MD;  Location: [facility] ENDOSCOPY;  Service: Cardiovascular;;   IR ANGIO INTRA EXTRACRAN SEL COM CAROTID INNOMINATE UNI L MOD SED  10/11/2020   IR ANGIO VERTEBRAL SEL VERTEBRAL UNI L MOD SED  10/11/2020   IR CT HEAD LTD  10/11/2020   IR CT  HEAD LTD  10/12/2020   IR PERCUTANEOUS ART THROMBECTOMY/INFUSION INTRACRANIAL INC DIAG ANGIO  10/11/2020   IR PERCUTANEOUS ART THROMBECTOMY/INFUSION INTRACRANIAL INC DIAG ANGIO  10/12/2020   NO PAST SURGERIES     PATENT FORAMEN OVALE(PFO) CLOSURE N/A 12/10/2020   Procedure: PATENT FORAMEN OVALE (PFO) CLOSURE;  Surgeon: Yates Decamp, MD;  Location: [facility] INVASIVE CV LAB;  Service: Cardiovascular;  Laterality: N/A;   RADIOLOGY WITH ANESTHESIA N/A 10/12/2020   Procedure: RADIOLOGY WITH ANESTHESIA;  Surgeon: Radiologist, Medication, MD;  Location: [facility] OR;  Service: Radiology;  Laterality: N/A;   RADIOLOGY WITH ANESTHESIA N/A 10/11/2020   Procedure: IR WITH ANESTHESIA;  Surgeon: Radiologist, Medication, MD;  Location: [facility] OR;  Service: Radiology;  Laterality: N/A;   TEE WITHOUT CARDIOVERSION N/A 10/17/2020   Procedure:  TRANSESOPHAGEAL ECHOCARDIOGRAM (TEE);  Surgeon: Jake Bathe, MD;  [facility] ENDOSCOPY;  Service: Cardiovascular;  Laterality: N/A;   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   Stroke (HCC)    BP 105/69    Pulse (!) 102    Ht 5' 7.5" (1.715 m)    Wt 207 lb (93.9 kg)    SpO2 98%    BMI 31.94 kg/m   Opioid Risk Score:   Fall Risk Score:  `1  Depression screen PHQ 2/9  Depression screen [facility] 2/9 02/24/2021 02/10/2021 01/13/2021 12/31/2020 12/16/2020 11/01/2020 10/06/2019  Decreased Interest 1 3 1 3 3 1 1   Down, Depressed, Hopeless 0 1 1 3 2 1 2   PHQ - 2 Score 1 4 2 6 5 2 3   Altered sleeping 3 3 1  - 3 3 3   Tired, decreased energy 3 3 3  - 3 3 3   Change in appetite 2 3 3  - 2 1 3   Feeling bad or failure about yourself  0 0 1 - 1 1 1   Trouble concentrating 1 2 1  - 1 1 2   Moving slowly or fidgety/restless 2 2 0 - 0 1 1  Suicidal thoughts 0 0 0 - 0 0 0  PHQ-9 Score 12 17 11  - 15 12 16   Difficult doing work/chores - Extremely dIfficult Extremely dIfficult - Extremely dIfficult Somewhat difficult Very difficult    Review of Systems  Gastrointestinal:  Positive for nausea.  Musculoskeletal:        Tingling  Neurological:  Positive for weakness and numbness. Negative for tremors.  Psychiatric/Behavioral:         Anxiety, depression  All other systems reviewed and are negative.     Objective:   Physical Exam Constitutional:      Appearance: She is obese.  HENT:     Head: Normocephalic and atraumatic.  Eyes:     Extraocular Movements: Extraocular movements intact.     Conjunctiva/sclera: Conjunctivae normal.     Pupils: Pupils are equal, round, and reactive to light.  Skin:    General: Skin is warm and dry.  Neurological:     Mental Status: She is alert and oriented to person, place, and time.      Cranial Nerves: No dysarthria.     Sensory: No sensory deficit.     Motor: Weakness and abnormal muscle tone present. No tremor.     Gait: Tandem walk abnormal. Gait normal.     Deep Tendon  Reflexes:     Reflex Scores:      Tricep reflexes are 1+ on the right side and 3+ on the left side.      Bicep reflexes are 2+ on the right side and 3+ on the left side.      Brachioradialis reflexes are 1+ on the right side and 3+ on the left side.      Patellar reflexes are 2+ on the right side and 3+ on the left side.      Achilles reflexes are 1+ on the right side and 3+ on the left side. Psychiatric:        Mood and Affect: Mood normal.        Behavior: Behavior normal.   Minimal difficulty with finger thumb opposition on the left side slightly slower than on the right side. Motor strength is 5/5 bilateral deltoid bicep tricep grip hip flexor knee extensor ankle dorsiflexion plantarflexion Ambulates without assistive device no evidence of toe drag or knee instability she has mild difficulty with tandem gait.       Assessment & Plan:  1.  History of right MCA infarct she has residual mild fine motor deficits, she has minimal balance issues as well.  I do think that she is nearing a plateau and have advised that she start working out of gym doing machines as well as cardio. I do not think formal therapy is needed at this time She will follow-up with neurology as well as primary care We discussed the location of her stroke, implications of her residual deficits. Physical medicine rehab follow-up on as-needed basis

## 2021-03-21 NOTE — Patient Instructions (Signed)
Gabapentin and pregabalin may be used for abnormal painful sensation but are usually ineffective in relieving just numbness

## 2021-03-24 ENCOUNTER — Other Ambulatory Visit: Payer: Self-pay

## 2021-03-24 ENCOUNTER — Encounter: Payer: Self-pay | Admitting: Psychiatry

## 2021-03-24 ENCOUNTER — Ambulatory Visit (INDEPENDENT_AMBULATORY_CARE_PROVIDER_SITE_OTHER): Payer: 59 | Admitting: Psychiatry

## 2021-03-24 VITALS — BP 143/99 | HR 101 | Temp 98.3°F | Wt 206.2 lb

## 2021-03-24 DIAGNOSIS — F1220 Cannabis dependence, uncomplicated: Secondary | ICD-10-CM | POA: Diagnosis not present

## 2021-03-24 DIAGNOSIS — F17200 Nicotine dependence, unspecified, uncomplicated: Secondary | ICD-10-CM

## 2021-03-24 DIAGNOSIS — F32A Depression, unspecified: Secondary | ICD-10-CM | POA: Diagnosis not present

## 2021-03-24 DIAGNOSIS — F1111 Opioid abuse, in remission: Secondary | ICD-10-CM

## 2021-03-24 DIAGNOSIS — F411 Generalized anxiety disorder: Secondary | ICD-10-CM

## 2021-03-24 DIAGNOSIS — F1994 Other psychoactive substance use, unspecified with psychoactive substance-induced mood disorder: Secondary | ICD-10-CM

## 2021-03-24 DIAGNOSIS — F1311 Sedative, hypnotic or anxiolytic abuse, in remission: Secondary | ICD-10-CM

## 2021-03-24 MED ORDER — HYDROXYZINE PAMOATE 50 MG PO CAPS: 50.0000 mg | ORAL_CAPSULE | Freq: Two times a day (BID) | ORAL | 1 refills | Status: DC | PRN

## 2021-03-24 MED ORDER — MIRTAZAPINE 7.5 MG PO TABS: 7.5000 mg | ORAL_TABLET | Freq: Every day | ORAL | 1 refills | Status: DC

## 2021-03-24 NOTE — Patient Instructions (Signed)
Serotonin Syndrome Serotonin is a chemical in your body (neurotransmitter) that helps to control several functions, such as: Brain and nerve cell function. Mood and emotions. Memory. Eating. Sleeping. Sexual activity. Stress response. Having too much serotonin in your body can cause serotonin syndrome. This condition can be harmful to your brain and nerve cells. This can be a life-threatening condition. What are the causes? This condition may be caused by taking medicines or drugs that increase the level of serotonin in your body, such as: Antidepressant medicines. Migraine medicines. Certain pain medicines. Certain drugs, including ecstasy, LSD, cocaine, and amphetamines. Over-the-counter cough or cold medicines that contain dextromethorphan. Certain herbal supplements, including St. John's wort, ginseng, and nutmeg. This condition usually occurs when you take these medicines or drugs in combination, but it can also happen with a high dose of a single medicine or drug. What increases the risk? You are more likely to develop this condition if: You just started taking a medicine or drug that increases the level of serotonin in the body. You recently increased the dose of a medicine or drug that increases the level of serotonin in the body. You take more than one medicine or drug that increases the level of serotonin in the body. What are the signs or symptoms? Symptoms of this condition usually start within several hours of taking a medicine or drug. Symptoms may be mild or severe. Mild symptoms include: Sweating. Restlessness or agitation. Muscle twitching or stiffness. Rapid heart rate. Nausea and vomiting. Diarrhea. Headache. Shivering or goose bumps. Confusion. Severe symptoms include: Irregular heartbeat. Seizures. Loss of consciousness. High fever. How is this diagnosed? This condition may be diagnosed based on: Your medical history.  A physical exam. Your prior use  of drugs and medicines. Blood or urine tests. These may be used to rule out other causes of your symptoms. How is this treated? The treatment for this condition depends on the severity of your symptoms. For mild cases, stopping the medicine or drug that caused your condition is usually all that is needed. For moderate to severe cases, treatment in a hospital may be needed to prevent or manage life-threatening symptoms. This may include medicines to control your symptoms, IV fluids, interventions to support your breathing, and treatments to control your body temperature. Follow these instructions at home: Medicines  Take over-the-counter and prescription medicines only as told by your health care provider. This is important. Check with your health care provider before you start taking any new prescriptions, over-the-counter medicines, herbs, or supplements. Avoid combining any medicines that can cause this condition to occur. Lifestyle  Maintain a healthy lifestyle. Eat a healthy diet that includes plenty of vegetables, fruits, whole grains, low-fat dairy products, and lean protein. Do not eat a lot of foods that are high in fat, added sugars, or salt. Get the right amount and quality of sleep. Most adults need 7-9 hours of sleep each night. Make time to exercise, even if it is only for short periods of time. Most adults should exercise for at least 150 minutes each week. Do not drink alcohol. Do not use illegal drugs, and do not take medicines for reasons other than they are prescribed. General instructions Do not use any products that contain nicotine or tobacco, such as cigarettes and e-cigarettes. If you need help quitting, ask your health care provider. Keep all follow-up visits as told by your health care provider. This is important. Contact a health care provider if: Your symptoms do not improve or they  get worse. Get help right away if you: Have worsening confusion, severe headache,  chest pain, high fever, seizures, or loss of consciousness. Experience serious side effects of medicine, such as swelling of your face, lips, tongue, or throat. Have serious thoughts about hurting yourself or others. These symptoms may represent a serious problem that is an emergency. Do not wait to see if the symptoms will go away. Get medical help right away. Call your local emergency services (911 in the [place] Do not drive yourself to the hospital. If you ever feel like you may hurt yourself or others, or have thoughts about taking your own life, get help right away. You can go to your nearest emergency department or call: Your local emergency services (911 in the [place] A suicide crisis helpline, such as the National Suicide Prevention Lifeline at (219) 872-7847. This is open 24 hours a day. Summary Serotonin is a brain chemical that helps to regulate the nervous system. High levels of serotonin in the body can cause serotonin syndrome, which is a very dangerous condition. This condition may be caused by taking medicines or drugs that increase the level of serotonin in your body. Treatment depends on the severity of your symptoms. For mild cases, stopping the medicine or drug that caused your condition is usually all that is needed. Check with your health care provider before you start taking any new prescriptions, over-the-counter medicines, herbs, or supplements. This information is not intended to replace advice given to you by your health care provider. Make sure you discuss any questions you have with your health care provider. Document Revised: 02/26/2017 Document Reviewed: 02/26/2017 Elsevier Patient Education  2022 [employer] Mirtazapine Tablets What is this medication? MIRTAZAPINE (mir TAZ a peen) treats depression. It increases the amount of serotonin and norepinephrine in the brain, hormones that help regulate mood. This medicine may be used for other purposes; ask your health  care provider or pharmacist if you have questions. COMMON BRAND NAME(S): Remeron What should I tell my care team before I take this medication? They need to know if you have any of these conditions: Bipolar disorder Glaucoma Kidney disease Liver disease Suicidal thoughts An unusual or allergic reaction to mirtazapine, other medications, foods, dyes, or preservatives Pregnant or trying to get pregnant Breast-feeding How should I use this medication? Take this medication by mouth with a glass of water. Follow the directions on the prescription label. Take your medication at regular intervals. Do not take your medication more often than directed. Do not stop taking this medication suddenly except upon the advice of your care team. Stopping this medication too quickly may cause serious side effects or your condition may worsen. A special MedGuide will be given to you by the pharmacist with each prescription and refill. Be sure to read this information carefully each time. Talk to your care team about the use of this medication in children. Special care may be needed. Overdosage: If you think you have taken too much of this medicine contact a poison control center or emergency room at once. NOTE: This medicine is only for you. Do not share this medicine with others. What if I miss a dose? If you miss a dose, take it as soon as you can. If it is almost time for your next dose, take only that dose. Do not take double or extra doses. What may interact with this medication? Do not take this medication with any of the following: Linezolid MAOIs like Carbex, Eldepryl, Marplan, Nardil, and  Parnate Methylene blue (injected into a vein) This medication may also interact with the following: Alcohol Antiviral medications for HIV or AIDS Certain medications that treat or prevent blood clots like warfarin Certain medications for depression, anxiety, or psychotic disturbances Certain medications for  fungal infections like ketoconazole and itraconazole Certain medications for migraine headache like almotriptan, eletriptan, frovatriptan, naratriptan, rizatriptan, sumatriptan, zolmitriptan Certain medications for seizures like carbamazepine or phenytoin Certain medications for sleep Cimetidine Erythromycin Fentanyl Lithium Medications for blood pressure Nefazodone Rasagiline Rifampin Supplements like St. John's wort, kava kava, valerian Tramadol Tryptophan This list may not describe all possible interactions. Give your health care provider a list of all the medicines, herbs, non-prescription drugs, or dietary supplements you use. Also tell them if you smoke, drink alcohol, or use illegal drugs. Some items may interact with your medicine. What should I watch for while using this medication? Tell your care team if your symptoms do not get better or if they get worse. Visit your care team for regular checks on your progress. Because it may take several weeks to see the full effects of this medication, it is important to continue your treatment as prescribed by your doctor. Patients and their families should watch out for new or worsening thoughts of suicide or depression. Also watch out for sudden changes in feelings such as feeling anxious, agitated, panicky, irritable, hostile, aggressive, impulsive, severely restless, overly excited and hyperactive, or not being able to sleep. If this happens, especially at the beginning of treatment or after a change in dose, call your care team. You may get drowsy or dizzy. Do not drive, use machinery, or do anything that needs mental alertness until you know how this medication affects you. Do not stand or sit up quickly, especially if you are an older patient. This reduces the risk of dizzy or fainting spells. Alcohol may interfere with the effect of this medication. Avoid alcoholic drinks. This medication may cause dry eyes and blurred vision. If you wear  contact lenses you may feel some discomfort. Lubricating drops may help. See your eye care team if the problem does not go away or is severe. Your mouth may get dry. Chewing sugarless gum or sucking hard candy, and drinking plenty of water may help. Contact your care team if the problem does not go away or is severe. What side effects may I notice from receiving this medication? Side effects that you should report to your care team as soon as possible: Allergic reactions--skin rash, itching, hives, swelling of the face, lips, tongue, or throat Heart rhythm changes--fast or irregular heartbeat, dizziness, feeling faint or lightheaded, chest pain, trouble breathing Infection--fever, chills, cough, or sore throat Irritability, confusion, fast or irregular heartbeat, muscle stiffness, twitching muscles, sweating, high fever, seizure, chills, vomiting, diarrhea, which may be signs of serotonin syndrome Low sodium level--muscle weakness, fatigue, dizziness, headache, confusion Rash, fever, and swollen lymph nodes Redness, blistering, peeling or loosening of the skin, including inside the mouth Seizures Sudden eye pain or change in vision such as blurry vision, seeing halos around lights, vision loss Thoughts of suicide or self-harm, worsening mood, feelings of depression Side effects that usually do not require medical attention (report to your care team if they continue or are bothersome): Constipation Dizziness Drowsiness Dry mouth Increase in appetite Weight gain This list may not describe all possible side effects. Call your doctor for medical advice about side effects. You may report side effects to FDA at 1-800-FDA-1088. Where should I keep my medication?  Keep out of the reach of children. Store at room temperature between 15 and 30 degrees C (59 and 86 degrees F) Protect from light and moisture. Throw away any unused medication after the expiration date. NOTE: This sheet is a summary. It  may not cover all possible information. If you have questions about this medicine, talk to your doctor, pharmacist, or health care provider.  2022 Elsevier/[employer] Standard (2020-04-17 00:00:00)

## 2021-03-24 NOTE — Progress Notes (Signed)
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   Visit Diagnosis:    ICD-10-CM   1. GAD (generalized anxiety disorder)  F41.1 mirtazapine (REMERON) 7.5 MG tablet    2. Depression, unspecified depression type  F32.A mirtazapine (REMERON) 7.5 MG tablet    Drugs of Abuse Scr ONLY, 10,WB   R/O MDD versus substance induced depression    3. Cannabis use disorder, severe, dependence (HCC)  F12.20 Drugs of Abuse Scr ONLY, 10,WB    4. Nicotine use disorder  F17.200 Drugs of Abuse Scr ONLY, 10,WB    5. 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 History of opioid abuse (HCC)  F11.11       History of Present Illness:  Kristen Grant is a 26 year old Caucasian female, currently employed, single, lives in [place], has a history of right MCA infarct with mild weakness of left side ( 2022 sept), presented to establish care.  Patient reports she has been struggling with mood lability, irritability, anger issues since teenage years.  Patient reports that she has been to anger management classes as a child.  Patient reports however she has never tried medications as a child.  She however has been to a therapist.  Her mood symptoms started getting worse after her stroke in September 2022.  Patient with right-sided MCA infarct, currently has mild residual weakness of left side mostly on the upper extremities than lower.  Currently under the care of providers for the same.  Reviewed notes per Dr. 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  This has been going on since the past at least 6 years.  Since her stroke this has been getting worse.  Patient reports she tried multiple medications prescribed by her primary care provider recently.  She was tried on sertraline however that gave her side effects of appetite loss.  She stopped taking it after a couple of weeks.  Patient thereafter was started on Lexapro which triggered her migraine headaches and hence had to stop taking it.  Patient was prescribed BuSpar along with the Lexapro which she tried for a few weeks however stopped taking it since she felt it was not working.  BuSpar however was at a very low dosage of 5 mg twice a day.  Patient does report sadness, lack of motivation, sleep problems, low energy, poor appetite, feeling bad about herself, concentration problems and so on.  This has been getting worse since the past several months.  Patient does not believe most recent medications helped with her mood.  Patient does report excessive spending on and off as well as sleep problems, denies any other hypomanic or manic symptoms.  Denies suicidality, homicidality or perceptual disturbances.  Denies any trauma.  Does report using cannabis, uses 1 to 2 g/day, has been using daily, smokes  it, since the past 8 to 9 years.  Patient reports she would like to quit smoking however it has been hard for her.  Patient also reports a remote history of using Xanax as well as Percocet on and off for at least 2 years.  Stopped using it around the time of her stroke.     Associated Signs/Symptoms: Depression Symptoms:  depressed mood, anhedonia, insomnia, fatigue, feelings of worthlessness/guilt, difficulty  concentrating, anxiety, decreased appetite, (Hypo) Manic Symptoms:  Irritable Mood, Anxiety Symptoms:  Excessive Worry, Psychotic Symptoms:   reports paranoia - unknown if true psychosis , needs to explore more - does not affect functioning PTSD Symptoms: Negative  Past Psychiatric History: Denies inpatient mental health admissions.  Denies suicide attempts.  Was under the care of her primary care provider who was treating her depression and anxiety.  Used to be under the care of psychotherapist in the past.  Patient also reports she has attended anger management classes as a teenager.  Also reports possible remote history of being diagnosed with ADHD.  Previous Psychotropic Medications: Yes Zoloft-side effects, Lexapro-triggered her migraine headaches, BuSpar-low-dose-noncompliant.  Substance Abuse History in the last 12 months:  Yes.  As noted above-heavy cannabis use-since the past 8 to 9 years daily use-1 to 2 g-smokes.  Reports use of Xanax-half bar when she used it-Percocet-1 tablet when she used it-on and off for 2 years-last use was prior to September 2022.    Consequences of Substance Abuse: Medical Consequences:  affects her mood symptoms Does report a past history of legal problems-DWI several years ago.  Past Medical History:  Past Medical History:  Diagnosis Date   Anxiety    Asthma    no treatments required at this time   Depression    Migraine headache    Nicotine vapor product user 10/06/2019   Right middle cerebral artery stroke ([place]) 10/17/2020   Stroke [facility])     Past Surgical History:  Procedure Laterality Date   BUBBLE STUDY  10/17/2020   Procedure: BUBBLE STUDY;  Surgeon: Jerline Pain, MD;  Location: [facility] ENDOSCOPY;  Service: Cardiovascular;;   IR ANGIO INTRA EXTRACRAN SEL COM CAROTID INNOMINATE UNI L MOD SED  10/11/2020   IR ANGIO VERTEBRAL SEL VERTEBRAL UNI L MOD SED  10/11/2020   IR CT HEAD LTD  10/11/2020   IR CT HEAD LTD  10/12/2020   IR PERCUTANEOUS ART  THROMBECTOMY/INFUSION INTRACRANIAL INC DIAG ANGIO  10/11/2020   IR PERCUTANEOUS ART THROMBECTOMY/INFUSION INTRACRANIAL INC DIAG ANGIO  10/12/2020   NO PAST SURGERIES     PATENT FORAMEN OVALE(PFO) CLOSURE N/A 12/10/2020   Procedure: PATENT FORAMEN OVALE (PFO) CLOSURE;  Surgeon: Adrian Prows, MD;  Location: [facility] CV LAB;  Service: Cardiovascular;  Laterality: N/A;   RADIOLOGY WITH ANESTHESIA N/A 10/12/2020   Procedure: RADIOLOGY WITH ANESTHESIA;  Surgeon: Radiologist, Medication, MD;  Location: [facility];  Service: Radiology;  Laterality: N/A;   RADIOLOGY WITH ANESTHESIA N/A 10/11/2020   Procedure: IR WITH ANESTHESIA;  Surgeon: Radiologist, Medication, MD;  Location: [facility];  Service: Radiology;  Laterality: N/A;   TEE WITHOUT CARDIOVERSION N/A 10/17/2020   Procedure: TRANSESOPHAGEAL ECHOCARDIOGRAM (TEE);  Surgeon: Jerline Pain, MD;  Location: [facility] ENDOSCOPY;  Service: Cardiovascular;  Laterality: N/A;    Family Psychiatric History: As noted below.  Family History:  Family History  Problem Relation Age of Onset   Hypertension Mother    Epilepsy Mother    Anxiety disorder Mother    Depression Mother  Migraines Mother    Diabetes Maternal Grandmother    Stroke Maternal Grandmother    Hypertension Maternal Grandmother    Asthma Half-Brother    Asthma Half-Sister    Breast cancer Neg Hx     Social History:   Social History   Socioeconomic History   Marital status: Single    Spouse name: Not on file   Number of children: 1   Years of education: Not on file   Highest education level: GED or equivalent  Occupational History   Not on file  Tobacco Use   Smoking status: Former    Types: E-cigarettes    Start date: 2021   Smokeless tobacco: Never  Vaping Use   Vaping Use: Every day   Substances: Nicotine, THC  Substance and Sexual Activity   Alcohol use: Not Currently   Drug use: Yes    Frequency: 3.0 times per week    Types: Marijuana    Comment: daily   Sexual activity: Yes     Partners: Male  Other Topics Concern   Not on file  Social History Narrative   Not on file   Social Determinants of Health   Financial Resource Strain: Not on file  Food Insecurity: Not on file  Transportation Needs: Not on file  Physical Activity: Not on file  Stress: Not on file  Social Connections: Not on file    Additional Social History: Patient reports she was raised by her mother.  She never knew her dad.  Her mother also has mental health problems and hence she was also raised by her maternal grandmother.  Patient went up to 10th grade and dropped out.  Later on went and got a GED.  Patient does report behavioral problems as a teenager and reports being in juvenile detention center for a day.  Patient also reports a history of DWI as a teenager.  Patient is currently single.  She does have a 68-year-old son-Christian from a relationship.  His father is not involved.  He is in and out of jail.  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  She currently lives in [place].  Her mother lives with her.  Allergies:  No Known Allergies  Metabolic Disorder Labs: Lab Results  Component Value Date   HGBA1C 5.0 10/12/2020   MPG 96.8 10/12/2020   No results found for: PROLACTIN Lab Results  Component Value Date   CHOL 109 12/03/2020   TRIG 110 12/03/2020   HDL 36 (L) 12/03/2020   CHOLHDL 3.0 12/03/2020   VLDL 11 10/12/2020   LDLCALC 53 12/03/2020   LDLCALC 117 (H) 10/12/2020   Lab Results  Component Value Date   TSH 0.821 01/13/2021    Therapeutic Level Labs: No results found for: LITHIUM No results found for: CBMZ No results found for: VALPROATE  Current Medications: Current Outpatient Medications  Medication Sig Dispense Refill   atorvastatin (LIPITOR) 80 MG tablet Take 1 tablet (80 mg total) by mouth daily. 90 tablet 3   cyclobenzaprine (FLEXERIL) 10 MG tablet Take 1 tablet (10 mg total) by mouth at bedtime. 30 tablet 0    mirtazapine (REMERON) 7.5 MG tablet Take 1 tablet (7.5 mg total) by mouth at bedtime. 30 tablet 1   venlafaxine XR (EFFEXOR XR) 75 MG 24 hr capsule Take 1 capsule (75 mg total) by mouth daily with breakfast. 90 capsule 1   Vitamin D, Ergocalciferol, (DRISDOL) 1.25 MG (50000 UNIT) CAPS capsule Take 1 capsule (50,000 Units  total) by mouth every 7 (seven) days. 12 capsule 0   hydrOXYzine (VISTARIL) 50 MG capsule Take 1 capsule (50 mg total) by mouth 2 (two) times daily as needed. 30 capsule 1   No current facility-administered medications for this visit.    Musculoskeletal: Strength & Muscle Tone:  limited on left side, normal right side Gait & Station: normal Patient leans: N/A  Psychiatric Specialty Exam: Review of Systems  Neurological:  Positive for weakness (left side paresis , S/P Rt.MCA).  Psychiatric/Behavioral:  Positive for decreased concentration, dysphoric mood and sleep disturbance. The patient is nervous/anxious.   All other systems reviewed and are negative.  Blood pressure (!) 143/99, pulse (!) 101, temperature 98.3 F (36.8 C), temperature source Temporal, weight 206 lb 3.2 oz (93.5 kg), last menstrual period 03/24/2021.Body mass index is 31.82 kg/m.  General Appearance: Casual  Eye Contact:  Good  Speech:  Clear and Coherent  Volume:  Normal  Mood:  Anxious and Depressed  Affect:  Tearful  Thought Process:  Goal Directed and Descriptions of Associations: Intact  Orientation:  Full (Time, Place, and Person)  Thought Content:  Rumination  Suicidal Thoughts:  No  Homicidal Thoughts:  No  Memory:  Immediate;   Fair Recent;   Fair Remote;   Fair  Judgement:  Fair  Insight:  Fair  Psychomotor Activity:  Normal  Concentration:  Concentration: Fair and Attention Span: Fair  Recall:  [employer] of Knowledge:Fair  Language: Fair  Akathisia:  No  Handed:  Right  AIMS (if indicated):   done,0  Assets:  Communication Skills Desire for Improvement Housing Social  Support  ADL's:  Intact  Cognition: WNL  Sleep:  Poor   Screenings: GAD-7    Flowsheet Row Office Visit from 03/24/2021 in [facility] Office Visit from 02/24/2021 in [facility] Visit from 02/10/2021 in [facility] Visit from 01/13/2021 in [facility] Visit from 12/16/2020 in [facility]  Total GAD-7 Score 21 18 21 21 21       PHQ2-9    [facility] Visit from 03/24/2021 in [facility] Office Visit from 03/21/2021 in [facility] Visit from 02/24/2021 in [facility] Visit from 02/10/2021 in [facility] Visit from 01/13/2021 in [facility]  PHQ-2 Total Score 6 2 1 4 2   PHQ-9 Total Score 22 -- 12 17 11       [facility] Visit from 03/24/2021 in [facility] ED from 11/13/2020 in [facility] Admission (Discharged) from 10/17/2020 in [facility] No Risk No Risk No Risk       Assessment and Plan: Kristen Grant is a 26 year old Caucasian female, single, employed, lives in [place], has a history of anxiety, depression, anger management problems, history of right MCA infarct with residual mild fine motor deficits on the left side, was evaluated in office today, presented to establish care.  Patient currently with comorbid cannabis abuse, heavy use on a daily basis, likely contributing to her mood symptoms, will benefit from the following plan. The patient demonstrates the following risk factors for suicide: Chronic risk factors for suicide include: psychiatric disorder of anxiety, substance use disorder, and medical illness stroke . Acute risk factors for suicide include:  uncontrolled mood . Protective factors for this patient include:  positive therapeutic relationship, responsibility  to others (children, family), and coping skills. Considering these factors, the overall suicide risk at this point appears to be low. Patient is appropriate for outpatient follow up.  Plan GAD-unstable Continue Effexor XR 75 mg p.o. daily with breakfast Continue hydroxyzine however we will reduce the dosage to 50 mg p.o. twice daily as needed Referral for individual psychotherapy-communicated with staff.  Depressive disorder-unstable  R/O MDD versus likely due to cannabis and hx of other substance abuse Referral for IOP-I have sent a referral to Ms. Dellia Nims. Start mirtazapine 7.5 mg p.o. nightly for sleep as well as mood. Discussed drug to drug interaction with Effexor, including serotonin syndrome.  Cannabis use disorder severe-unstable Provided brief counseling Referral for IOP. Also will refer for individual psychotherapy Will order labs - a urine drug screen.  Patient to [facility] to [facility].   Nicotine use disorder-unstable Provided counseling for 5 minutes. Discussed NRT.  Benzodiazepine abuse in remission We will monitor closely  History of opioid abuse We will monitor closely  I have reviewed notes per Dr.Kirstein -dated 03/21/2021-[facility]-patient currently improving after right MCA. Patient Reviewed notes for Ms. Santiago Glad [place]-dated 02/24/2021-generalized anxiety disorder, depression, was started on Effexor , increased to 75 mg, hydroxyzine 50 mg.  Referred for psychiatric consultation.'  Reviewed and discussed labs including CBC-within normal limits 12/06/2020, BMP-within normal limits-12/06/2020, TSH-within normal limits-01/13/2021.  Follow-up in clinic in 2 weeks or sooner if needed.   Collaboration of Care: Referral or follow-up with counselor/therapist AEB patient referred for therapy, I have communicated with staff to schedule her with our therapist and Other I have communicated  with [facility]-to evaluate this patient for CD IOP versus MH IOP.  Patient/Guardian was advised Release of Information must be obtained prior to any record release in order to collaborate their care with an outside provider. Patient/Guardian was advised if they have not already done so to contact the registration department to sign all necessary forms in order for [place] to release information regarding their care.   Consent: Patient/Guardian gives verbal consent for treatment and assignment of benefits for services provided during this visit. Patient/Guardian expressed understanding and agreed to proceed.   This note was generated in part or whole with voice recognition software. Voice recognition is usually quite accurate but there are transcription errors that can and very often do occur. I apologize for any typographical errors that were not detected and corrected.     Ursula Alert, MD 2/21/202312:37 PM

## 2021-03-25 ENCOUNTER — Encounter: Payer: Managed Care, Other (non HMO) | Admitting: Occupational Therapy

## 2021-03-25 ENCOUNTER — Ambulatory Visit: Payer: Managed Care, Other (non HMO) | Admitting: Physical Therapy

## 2021-03-25 ENCOUNTER — Telehealth (HOSPITAL_COMMUNITY): Payer: Self-pay | Admitting: Psychiatry

## 2021-03-25 NOTE — Telephone Encounter (Signed)
D:  Pt returned the case manager's call.  A:  Oriented pt to both MH-IOP and CD-IOP.  Pt states she's not interested in either.  "I can't take off of work for that long.  I would prefer a therapist."  Pt states Dr. [name] scheduled her with [facility], LCSW, but it's not until April.  Informed pt that she could check with HR to see if they offer EAP or contact her insurance co for them to provide her with some approved provider's names to see if she can get in sooner.  Encouraged pt to contact the case manager if she changes her mind re: group in the future.  Inform Dr. [name].

## 2021-03-25 NOTE — Telephone Encounter (Signed)
D:  Dr. Elna Breslow referred pt to MH-IOP vs. CD-IOP.  A:  Placed call to patient but there was no answer.  Left vm for pt to call the case manager back.  Inform Dr. Elna Breslow.

## 2021-03-26 ENCOUNTER — Telehealth (HOSPITAL_COMMUNITY): Payer: Self-pay | Admitting: Psychiatry

## 2021-03-26 NOTE — Telephone Encounter (Signed)
D:  Dr. Elna Breslow asked if the case manager could call pt re: evening CD-IOP.  A:  Placed call to pt.  Informed pt that [facility] [phone]) has an evening program which meets M-W-F 6:30 pm-9 pm.  Pt states she is currently at work.  "I may be able to do that, but I will need to call you another time."  Encouraged pt to call the case mgr back for the phone # for [facility].  Inform Dr. Elna Breslow.  R:  Pt receptive.

## 2021-03-27 ENCOUNTER — Other Ambulatory Visit: Payer: Self-pay

## 2021-03-27 ENCOUNTER — Ambulatory Visit: Payer: Managed Care, Other (non HMO) | Admitting: Physical Therapy

## 2021-03-27 ENCOUNTER — Ambulatory Visit (INDEPENDENT_AMBULATORY_CARE_PROVIDER_SITE_OTHER): Payer: Managed Care, Other (non HMO) | Admitting: Nurse Practitioner

## 2021-03-27 ENCOUNTER — Encounter: Payer: Self-pay | Admitting: Nurse Practitioner

## 2021-03-27 VITALS — BP 110/64 | HR 83 | Temp 98.5°F | Wt 204.8 lb

## 2021-03-27 DIAGNOSIS — F411 Generalized anxiety disorder: Secondary | ICD-10-CM | POA: Diagnosis not present

## 2021-03-27 DIAGNOSIS — F321 Major depressive disorder, single episode, moderate: Secondary | ICD-10-CM

## 2021-03-27 NOTE — Progress Notes (Signed)
Established Patient Office Visit  Subjective:  Patient ID: Kristen Grant, female    DOB: 1995-08-24  Age: 26 y.o. MRN: [id number]  CC:  Chief Complaint  Patient presents with   Depression   Anxiety    HPI [name] presents for follow up on depression and anxiety.   ANXIETY/STRESS Patient states she saw Dr. [name] on Monday.  It was recommended to her that she take substance abuse classes to stop smoking marijuana.  She was given Mirtazipine for sleep but hasn't started it yet.  She states her depression and anxiety are about the same.  Some crying spells recently.   Depression screen [facility] 2/9 03/27/2021 03/24/2021 03/21/2021 02/24/2021 02/10/2021  Decreased Interest '3 3 1 1 3  ' Down, Depressed, Hopeless '3 3 1 ' 0 1  PHQ - 2 Score '6 6 2 1 4  ' Altered sleeping 3 3 - 3 3  Tired, decreased energy 3 3 - 3 3  Change in appetite 3 3 - 2 3  Feeling bad or failure about yourself  1 1 - 0 0  Trouble concentrating 2 3 - 1 2  Moving slowly or fidgety/restless 1 3 - 2 2  Suicidal thoughts 0 0 - 0 0  PHQ-9 Score 19 22 - 12 17  Difficult doing work/chores Very difficult Extremely dIfficult - - Extremely dIfficult  Some recent data might be hidden   GAD 7 : Generalized Anxiety Score 03/27/2021 03/24/2021 02/24/2021 02/10/2021  Nervous, Anxious, on Edge '3 3 3 3  ' Control/stop worrying '3 3 3 3  ' Worry too much - different things '3 3 3 3  ' Trouble relaxing '3 3 3 3  ' Restless '3 3 2 3  ' Easily annoyed or irritable '3 3 3 3  ' Afraid - awful might happen '2 3 1 3  ' Total GAD 7 Score '20 21 18 21  ' Anxiety Difficulty Very difficult Extremely difficult Extremely difficult Extremely difficult    Past Medical History:  Diagnosis Date   Anxiety    Asthma    no treatments required at this time   Depression    Migraine headache    Nicotine vapor product user 10/06/2019   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   Stroke [facility])     Past Surgical History:  Procedure Laterality Date   BUBBLE STUDY   10/17/2020   Procedure: BUBBLE STUDY;  Surgeon: Jerline Pain, MD;  Location: [place];  Service: Cardiovascular;;   IR ANGIO INTRA EXTRACRAN SEL COM CAROTID INNOMINATE UNI L MOD SED  10/11/2020   IR ANGIO VERTEBRAL SEL VERTEBRAL UNI L MOD SED  10/11/2020   IR CT HEAD LTD  10/11/2020   IR CT HEAD LTD  10/12/2020   IR PERCUTANEOUS ART THROMBECTOMY/INFUSION INTRACRANIAL INC DIAG ANGIO  10/11/2020   IR PERCUTANEOUS ART THROMBECTOMY/INFUSION INTRACRANIAL INC DIAG ANGIO  10/12/2020   NO PAST SURGERIES     PATENT FORAMEN OVALE(PFO) CLOSURE N/A 12/10/2020   Procedure: PATENT FORAMEN OVALE (PFO) CLOSURE;  Surgeon: Adrian Prows, MD;  Location: [place] [facility];  Service: Cardiovascular;  Laterality: N/A;   RADIOLOGY WITH ANESTHESIA N/A 10/12/2020   Procedure: RADIOLOGY WITH ANESTHESIA;  Surgeon: Radiologist, Medication, MD;  Location: [place];  Service: Radiology;  Laterality: N/A;   RADIOLOGY WITH ANESTHESIA N/A 10/11/2020   Procedure: IR WITH ANESTHESIA;  Surgeon: Radiologist, Medication, MD;  Location: [place];  Service: Radiology;  Laterality: N/A;   TEE WITHOUT CARDIOVERSION N/A 10/17/2020   Procedure: TRANSESOPHAGEAL ECHOCARDIOGRAM (TEE);  Surgeon: Jerline Pain, MD;  Location: [facility] ENDOSCOPY;  Service: Cardiovascular;  Laterality: N/A;    Family History  Problem Relation Age of Onset   Hypertension Mother    Epilepsy Mother    Anxiety disorder Mother    Depression Mother    Migraines Mother    Diabetes Maternal Grandmother    Stroke Maternal Grandmother    Hypertension Maternal Grandmother    Asthma Half-Brother    Asthma Half-Sister    Breast cancer Neg Hx     Social History   Socioeconomic History   Marital status: Single    Spouse name: Not on file   Number of children: 1   Years of education: Not on file   Highest education level: GED or equivalent  Occupational History   Not on file  Tobacco Use   Smoking status: Former    Types: E-cigarettes    Start date: 2021   Smokeless  tobacco: Never  Vaping Use   Vaping Use: Every day   Substances: Nicotine, THC  Substance and Sexual Activity   Alcohol use: Not Currently   Drug use: Yes    Frequency: 3.0 times per week    Types: Marijuana    Comment: daily   Sexual activity: Yes    Partners: Male  Other Topics Concern   Not on file  Social History Narrative   Not on file   Social Determinants of Health   Financial Resource Strain: Not on file  Food Insecurity: Not on file  Transportation Needs: Not on file  Physical Activity: Not on file  Stress: Not on file  Social Connections: Not on file  Intimate Partner Violence: Not on file    Outpatient Medications Prior to Visit  Medication Sig Dispense Refill   atorvastatin (LIPITOR) 80 MG tablet Take 1 tablet (80 mg total) by mouth daily. 90 tablet 3   cyclobenzaprine (FLEXERIL) 10 MG tablet Take 1 tablet (10 mg total) by mouth at bedtime. 30 tablet 0   hydrOXYzine (VISTARIL) 50 MG capsule Take 1 capsule (50 mg total) by mouth 2 (two) times daily as needed. 30 capsule 1   mirtazapine (REMERON) 7.5 MG tablet Take 1 tablet (7.5 mg total) by mouth at bedtime. 30 tablet 1   venlafaxine XR (EFFEXOR XR) 75 MG 24 hr capsule Take 1 capsule (75 mg total) by mouth daily with breakfast. 90 capsule 1   Vitamin D, Ergocalciferol, (DRISDOL) 1.25 MG (50000 UNIT) CAPS capsule Take 1 capsule (50,000 Units total) by mouth every 7 (seven) days. 12 capsule 0   No facility-administered medications prior to visit.    No Known Allergies  ROS Review of Systems  Respiratory: Negative.    Cardiovascular: Negative.   Gastrointestinal: Negative.   Genitourinary: Negative.   Musculoskeletal: Negative.   Skin: Negative.   Neurological: Negative.   Psychiatric/Behavioral:  Positive for dysphoric mood. Negative for suicidal ideas. The patient is nervous/anxious.      Objective:    Physical Exam Vitals and nursing note reviewed.  Constitutional:      General: She is not in  acute distress.    Appearance: Normal appearance.  HENT:     Head: Normocephalic and atraumatic.  Eyes:     Conjunctiva/sclera: Conjunctivae normal.  Cardiovascular:     Rate and Rhythm: Normal rate and regular rhythm.     Pulses: Normal pulses.     Heart sounds: Normal heart sounds.  Pulmonary:     Effort: Pulmonary effort is normal.  Breath sounds: Normal breath sounds.  Musculoskeletal:     Cervical back: Normal range of motion.  Skin:    General: Skin is warm and dry.  Neurological:     General: No focal deficit present.     Mental Status: She is alert and oriented to person, place, and time.  Psychiatric:        Mood and Affect: Mood normal.        Behavior: Behavior normal.        Thought Content: Thought content normal.        Judgment: Judgment normal.    BP 110/64    Pulse 83    Temp 98.5 F (36.9 C) (Oral)    Wt 204 lb 12.8 oz (92.9 kg)    LMP 03/24/2021 (Exact Date)    SpO2 97%    BMI 31.60 kg/m  Wt Readings from Last 3 Encounters:  03/27/21 204 lb 12.8 oz (92.9 kg)  03/24/21 206 lb 3.2 oz (93.5 kg)  03/21/21 207 lb (93.9 kg)     Health Maintenance Due  Topic Date Due   COVID-19 Vaccine (1) Never done   HPV VACCINES (2 - 3-dose series) 02/10/2021       Topic Date Due   HPV VACCINES (2 - 3-dose series) 02/10/2021    Lab Results  Component Value Date   TSH 0.821 01/13/2021   Lab Results  Component Value Date   WBC 8.2 12/06/2020   HGB 13.0 12/06/2020   HCT 37.6 12/06/2020   MCV 84 12/06/2020   PLT 307 12/06/2020   Lab Results  Component Value Date   NA 139 12/06/2020   K 3.5 12/06/2020   CO2 22 12/06/2020   GLUCOSE 110 (H) 12/06/2020   BUN 6 12/06/2020   CREATININE 0.60 12/06/2020   BILITOT 2.2 (H) 11/13/2020   ALKPHOS 70 11/13/2020   AST 33 11/13/2020   ALT 21 11/13/2020   PROT 7.0 11/13/2020   ALBUMIN 3.8 11/13/2020   CALCIUM 9.3 12/06/2020   ANIONGAP 10 11/13/2020   EGFR 128 12/06/2020   Lab Results  Component Value Date    CHOL 109 12/03/2020   Lab Results  Component Value Date   HDL 36 (L) 12/03/2020   Lab Results  Component Value Date   LDLCALC 53 12/03/2020   Lab Results  Component Value Date   TRIG 110 12/03/2020   Lab Results  Component Value Date   CHOLHDL 3.0 12/03/2020   Lab Results  Component Value Date   HGBA1C 5.0 10/12/2020      Assessment & Plan:   Problem List Items Addressed This Visit       Other   GAD (generalized anxiety disorder)    Chronic. Improved despite PHQ9 and GAD7 scores.  Patient feels like symptoms have improved.  Will continue with Effexor 40m.  Patient encouraged to start Mirtazipine.  Continue to follow up with Dr. EShea Evans  Return in 2 months for reevaluation.       Depression, major, single episode, moderate (HCC) - Primary    Chronic. Improved despite PHQ9 and GAD7 scores.  Patient feels like symptoms have improved.  Will continue with Effexor 72m  Patient encouraged to start Mirtazipine.  Continue to follow up with Dr. EaShea Evans Return in 2 months for reevaluation.        No orders of the defined types were placed in this encounter.    Follow-up: Return in about 2 months (around 05/25/2021) for Depression/Anxiety FU.  Jon Billings, NP

## 2021-03-27 NOTE — Assessment & Plan Note (Signed)
Chronic. Improved despite PHQ9 and GAD7 scores.  Patient feels like symptoms have improved.  Will continue with Effexor 75mg .  Patient encouraged to start Mirtazipine.  Continue to follow up with Dr. .  Return in 2 months for reevaluation.

## 2021-03-27 NOTE — Assessment & Plan Note (Signed)
Chronic. Improved despite PHQ9 and GAD7 scores.  Patient feels like symptoms have improved.  Will continue with Effexor 75mg.  Patient encouraged to start Mirtazipine.  Continue to follow up with Dr. Eappen.  Return in 2 months for reevaluation.  °

## 2021-04-01 ENCOUNTER — Ambulatory Visit: Payer: Managed Care, Other (non HMO) | Admitting: Physical Therapy

## 2021-04-03 ENCOUNTER — Ambulatory Visit: Payer: Managed Care, Other (non HMO) | Admitting: Physical Therapy

## 2021-04-18 ENCOUNTER — Other Ambulatory Visit: Payer: Self-pay

## 2021-04-18 ENCOUNTER — Encounter: Payer: Self-pay | Admitting: Family Medicine

## 2021-04-18 ENCOUNTER — Ambulatory Visit (LOCAL_COMMUNITY_HEALTH_CENTER): Payer: Managed Care, Other (non HMO) | Admitting: Family Medicine

## 2021-04-18 VITALS — BP 113/72 | Ht 68.0 in | Wt 204.0 lb

## 2021-04-18 DIAGNOSIS — Z1272 Encounter for screening for malignant neoplasm of vagina: Secondary | ICD-10-CM

## 2021-04-18 DIAGNOSIS — Z8673 Personal history of transient ischemic attack (TIA), and cerebral infarction without residual deficits: Secondary | ICD-10-CM

## 2021-04-18 DIAGNOSIS — Z113 Encounter for screening for infections with a predominantly sexual mode of transmission: Secondary | ICD-10-CM

## 2021-04-18 DIAGNOSIS — Z3009 Encounter for other general counseling and advice on contraception: Secondary | ICD-10-CM

## 2021-04-18 DIAGNOSIS — Z01419 Encounter for gynecological examination (general) (routine) without abnormal findings: Secondary | ICD-10-CM

## 2021-04-18 LAB — WET PREP FOR TRICH, YEAST, CLUE
Trichomonas Exam: NEGATIVE
Yeast Exam: NEGATIVE

## 2021-04-18 LAB — HM HIV SCREENING LAB: HM HIV Screening: NEGATIVE

## 2021-04-18 NOTE — Progress Notes (Signed)
[facility] ?[facility] ?[address] ?Main Number: [phone] ? ?Family Planning Visit- Repeat Yearly Visit ? ?Subjective:  ?Kristen Grant is a 26 y.o. Grant  being seen today for an annual wellness visit and to discuss contraception options.   The patient is currently using Female Condom for pregnancy prevention. Patient does not want a pregnancy in the next year.  ? ?she/her/hers report they are looking for a method that provides Other method that works with health history  ? ? 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 ? ?Chief Complaint  ?Patient presents with  ? Contraception  ?  PE, STD check and bc  ? ? ?Patient reports here for physical and discuss BCM  ? ?Patient denies   ? ?See flowsheet for other program required questions.  ? ?Body mass index is 31.02 kg/m?. - Patient is eligible for diabetes screening based on BMI and age 16>40?  not applicable ?HA1C ordered? not applicable ? ?Patient reports 8 of partners in last year. Desires STI screening?  Yes ? ? ?Has patient been screened once for HCV in the past?  No ? No results found for: HCVAB ? ?Does the patient have current of drug use, have a partner with drug use, and/or has been incarcerated since last result? Yes  ?If yes-- Screen [facility] HCV [facility] ?  ?Does the patient meet criteria  for HBV testing? Yes ? ?Criteria:  ?-Household, sexual or needle sharing contact with HBV ?-History of drug use ?-HIV positive ?-Those with known Hep C ? ? ?Health Maintenance Due  ?Topic Date Due  ? COVID-19 Vaccine (1) Never done  ? HPV VACCINES (2 - 3-dose series) 02/10/2021  ? ? ?Review of Systems  ?Constitutional:  Positive for weight loss. Negative for chills, fever and malaise/fatigue.  ?HENT:  Negative for congestion, hearing loss and sore throat.   ?Eyes:  Negative for blurred vision, double vision and photophobia.  ?Respiratory:  Negative for shortness of breath.   ?Cardiovascular:  Negative for chest pain.  ?Gastrointestinal:  Positive for nausea. Negative for abdominal pain, blood in stool, constipation, diarrhea, heartburn and vomiting.  ?Genitourinary:  Negative for dysuria and frequency.  ?Musculoskeletal:  Negative for back pain, joint pain and neck pain.  ?Skin:  Negative for itching and rash.  ?Neurological:  Positive for headaches. Negative for dizziness and weakness.  ?Endo/Heme/Allergies:  Does not bruise/bleed easily.  ?Psychiatric/Behavioral:  Negative for depression, substance abuse and suicidal ideas.   ? ?The following portions of the patient's history were reviewed and updated as appropriate: allergies, current medications, past family history, past medical history, past social history, past surgical history and problem list. Problem list updated. ? ?Objective:  ? ?Vitals:  ? 04/18/21 0841  ?BP: 113/72  ?Weight: 204 lb (92.5 kg)  ?Height: 5\' 8"  (1.727 m)  ? ? ?Physical Exam ?Vitals and nursing note reviewed.  ?Constitutional:   ?   Appearance: Normal appearance.  ?HENT:  ?   Head: Normocephalic and atraumatic.  ?  Mouth/Throat:  ?   Mouth: Mucous membranes are moist.  ?   Pharynx: No oropharyngeal exudate or posterior oropharyngeal erythema.  ?Eyes:  ?   General: No scleral icterus. ?Cardiovascular:  ?   Rate and Rhythm: Normal rate and regular rhythm.  ?   Pulses: Normal pulses.  ?    Heart sounds: Normal heart sounds.  ?Pulmonary:  ?   Effort: Pulmonary effort is normal.  ?   Breath sounds: Normal breath sounds.  ?Abdominal:  ?   General: Abdomen is flat. Bowel sounds are normal.  ?   Palpations: Abdomen is soft.  ?Genitourinary: ?   General: Normal vulva.  ?   Rectum: Normal.  ?   Comments: External genitalia without, lice, nits, erythema, edema , lesions or inguinal adenopathy. Vagina with normal mucosa and discharge and pH equals 4.  Cervix without visual lesions, uterus firm, mobile, non-tender, no masses, CMT adnexal fullness or tenderness.   ?Musculoskeletal:     ?   General: Normal range of motion.  ?   Cervical back: Normal range of motion and neck supple.  ?Skin: ?   General: Skin is warm and dry.  ?Neurological:  ?   General: No focal deficit present.  ?   Mental Status: She is alert and oriented to person, place, and time.  ?Psychiatric:     ?   Mood and Affect: Mood normal.     ?   Behavior: Behavior normal.  ? ? ? ? ?Assessment and Plan:  ?Kristen Grant presenting to the [facility] for an yearly wellness and contraception visit ? ? ?Contraception counseling: Reviewed options based on patient desire and reproductive life plan. Patient is interested in IUD or IUS. This was not provided to the patient today d/t pt last sex and unable to take estrogen, no supervising provider to observe IUD placement today  ? ?Risks, benefits, and typical effectiveness rates were reviewed.  Questions were answered.  Written information was also given to the patient to review.   ? ?The patient will follow up in  2 weeks for surveillance.  The patient was told to call with any further questions, or with any concerns about this method of contraception.  Emphasized use of condoms 100% of the time for STI prevention. ? ?Patient was not offered ECP based on  health history.  Pt hs Unprotected sex within past 72 hours.  Patient is within 1 days of unprotected  sex. Patient was not offered ECP, pt can not have ECP d/t history of strokes. Reviewed options and patient desired Cu-IUD  ? ? ?1. Smear, vaginal, as part of routine gynecological examination ?Well woman exam  ?Pap today  ?CBE today  ? ?- IGP, rfx Aptima HPV ASCU ? ?2. Screening examination for venereal disease ?Patient accepted all screenings including wet prep, oral, vaginal, rectal CT/GC and bloodwork for HIV/RPR.  ? ?Wet prep results neg ?Treatment needed  ?Discussed time line for State Lab results and that patient will be called with positive results and encouraged patient to call if she had not heard in 2 weeks.  ?Counseled to return or seek care for continued or worsening symptoms ? ?- Chlamydia/Gonorrhea [facility] ?- HIV [facility] ?- Syphilis Serology, [facility] ?- WET PREP FOR TRICH, YEAST, CLUE ?- Chlamydia/Gonorrhea [place] [facility] ?- Chlamydia/Gonorrhea [place] [facility] ? ? ?3. Counseling for birth control regarding intrauterine device (IUD) ?Discussed BCM with patient  d/t stroke history, pt is not on anticoagulation medication.  Reviewed the [facility] recommendations for her health history.  pt is interested in having ParaGuard.  Information given.  Pt scheduled for 2 weeks to RTC for insertion.  Discussed no sex or using condoms with all sex for the next 2 weeks.   ? ?4. History of stroke ?Two strokes in 10/2020, no longer on coagulation medication, pt stopped medication 1 month ago.   ? ?No follow-ups on file. ? ?Future Appointments  ?Date Time Provider Department Center  ?04/21/2021  8:45 AM Ihor Austin, NP [facility] None  ?04/21/2021  2:40 PM Jomarie Longs, MD [facility] None  ?05/07/2021 11:00 AM Hussami, Christina R, LCSW [facility] None  ?05/26/2021  2:00 PM Larae Grooms, NP [facility]  ?12/23/2021 10:00 AM [facility]VAS 2 [facility] None  ?01/07/2022 10:00 AM Yates Decamp, MD [facility] None  ? ? ?Wendi Snipes, FNP ?

## 2021-04-18 NOTE — Progress Notes (Signed)
Pt here for PE, Pap and birth control.  Wet mount results reviewed, no treatment required.  Pt given pamphlet on Paragard.  Berdie Ogren, RN ? ?

## 2021-04-21 ENCOUNTER — Other Ambulatory Visit
Admission: RE | Admit: 2021-04-21 | Discharge: 2021-04-21 | Disposition: A | Discharge status: Home / Self Care | Payer: Managed Care, Other (non HMO) | Source: Ambulatory Visit | Attending: Psychiatry | Admitting: Psychiatry

## 2021-04-21 ENCOUNTER — Ambulatory Visit (INDEPENDENT_AMBULATORY_CARE_PROVIDER_SITE_OTHER): Payer: Managed Care, Other (non HMO) | Admitting: Adult Health

## 2021-04-21 ENCOUNTER — Ambulatory Visit (INDEPENDENT_AMBULATORY_CARE_PROVIDER_SITE_OTHER): Payer: 59 | Admitting: Psychiatry

## 2021-04-21 ENCOUNTER — Other Ambulatory Visit: Payer: Self-pay

## 2021-04-21 ENCOUNTER — Encounter: Payer: Self-pay | Admitting: Adult Health

## 2021-04-21 ENCOUNTER — Encounter: Payer: Self-pay | Admitting: Psychiatry

## 2021-04-21 VITALS — BP 117/70 | HR 58 | Ht 68.0 in | Wt 205.0 lb

## 2021-04-21 VITALS — BP 107/72 | HR 79 | Temp 98.8°F | Wt 204.6 lb

## 2021-04-21 DIAGNOSIS — Q2112 Patent foramen ovale: Secondary | ICD-10-CM | POA: Diagnosis not present

## 2021-04-21 DIAGNOSIS — F32A Depression, unspecified: Secondary | ICD-10-CM | POA: Insufficient documentation

## 2021-04-21 DIAGNOSIS — F17200 Nicotine dependence, unspecified, uncomplicated: Secondary | ICD-10-CM | POA: Insufficient documentation

## 2021-04-21 DIAGNOSIS — I63511 Cerebral infarction due to unspecified occlusion or stenosis of right middle cerebral artery: Secondary | ICD-10-CM | POA: Diagnosis not present

## 2021-04-21 DIAGNOSIS — F411 Generalized anxiety disorder: Secondary | ICD-10-CM

## 2021-04-21 DIAGNOSIS — F1311 Sedative, hypnotic or anxiolytic abuse, in remission: Secondary | ICD-10-CM

## 2021-04-21 DIAGNOSIS — F1220 Cannabis dependence, uncomplicated: Secondary | ICD-10-CM | POA: Diagnosis not present

## 2021-04-21 DIAGNOSIS — E785 Hyperlipidemia, unspecified: Secondary | ICD-10-CM

## 2021-04-21 DIAGNOSIS — Z91199 Patient's noncompliance with other medical treatment and regimen due to unspecified reason: Secondary | ICD-10-CM

## 2021-04-21 DIAGNOSIS — F1111 Opioid abuse, in remission: Secondary | ICD-10-CM

## 2021-04-21 MED ORDER — MIRTAZAPINE 7.5 MG PO TABS: 7.5000 mg | ORAL_TABLET | Freq: Every day | ORAL | 1 refills | Status: DC

## 2021-04-21 MED ORDER — ASPIRIN EC 81 MG PO TBEC: 81.0000 mg | DELAYED_RELEASE_TABLET | Freq: Every day | ORAL | 3 refills | Status: DC

## 2021-04-21 NOTE — Progress Notes (Signed)
BH MD OP Progress Note  04/21/2021 3:06 PM Kristen Grant  MRN:  [id number]  Chief Complaint:  Chief Complaint  Patient presents with   Follow-up: 26 year old Caucasian female with history of GAD, depression unspecified, cannabis abuse severe dependence, nicotine use disorder, presented for follow-up medication management.   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  Patient today returns reporting she continues to struggle with mood lability, irritability, sadness, low motivation, concentration problem, appetite loss, sleep problems.  She also continues to worry and reports she is a Product/process development scientist.  She reports she started taking the mirtazapine after her last visit.  It did help her with her sleep.  However when she went out drinking after a few days she threw up and she wondered if the medication could have made it happen.  She hence stopped taking the mirtazapine.  Ever since not being on the mirtazapine she has not been sleeping.  Patient reports she continues to smoke cannabis, regular use, daily, 1 to 2 g/day.  Reports she has not cut back on it.  Does have upcoming appointment with therapist and agrees to keep it.  Was referred for IOP last visit however today reported that she is unable to do that due to her work schedule.  Not interested in going to a substance abuse treatment program.  Patient denies suicidality, homicidality or perceptual disturbances.  Patient denies any other concerns today.  Visit Diagnosis:    ICD-10-CM   1. GAD (generalized anxiety disorder)  F41.1 mirtazapine (REMERON) 7.5 MG tablet    2. Depression, unspecified depression type  F32.A mirtazapine (REMERON) 7.5 MG tablet   R/O MDD versus substance induced depression    3. Cannabis use disorder, severe, dependence (HCC)  F12.20      4. Nicotine use disorder  F17.200     5. 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 History of opioid abuse (HCC)  F11.11     7. Noncompliance with treatment plan  Z91.199       Past Psychiatric History: Reviewed past psychiatric history from progress note on 03/24/2021.  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   Stroke [facility])     Past Surgical History:  Procedure Laterality Date   BUBBLE STUDY  10/17/2020   Procedure: BUBBLE STUDY;  Surgeon: Jake Bathe, MD;  [facility] END[facility];  Service: Cardiovascular;;   IR ANGIO INTRA EXTRACRAN SEL COM CAROTID INNOMINATE UNI L MOD SED  10/11/2020   IR ANGIO VERTEBRAL SEL VERTEBRAL UNI L MOD SED  10/11/2020   IR CT HEAD LTD  10/11/2020   IR CT HEAD LTD  10/12/2020   IR PERCUTANEOUS ART THROMBECTOMY/INFUSION INTRACRANIAL INC DIAG ANGIO  10/11/2020   IR PERCUTANEOUS ART THROMBECTOMY/INFUSION INTRACRANIAL INC DIAG ANGIO  10/12/2020   NO PAST SURGERIES     PATENT FORAMEN OVALE(PFO) CLOSURE N/A 12/10/2020   Procedure: PATENT FORAMEN OVALE (PFO) CLOSURE;  Surgeon: Yates Decamp, MD;  [facility] INV[facility];  Service: Cardiovascular;  Laterality: N/A;   RADIOLOGY WITH ANESTHESIA N/A 10/12/2020   Procedure:  RADIOLOGY WITH ANESTHESIA;  Surgeon: Radiologist, Medication, MD;  Location: [facility] OR;  Service: Radiology;  Laterality: N/A;   RADIOLOGY WITH ANESTHESIA N/A 10/11/2020   Procedure: IR WITH ANESTHESIA;  Surgeon: Radiologist, Medication, MD;  Location: [facility] OR;  Service: Radiology;  Laterality: N/A;   TEE WITHOUT CARDIOVERSION N/A 10/17/2020   Procedure: TRANSESOPHAGEAL ECHOCARDIOGRAM (TEE);  Surgeon: Jake Bathe, MD;  Location: [facility] ENDOSCOPY;  Service: Cardiovascular;  Laterality: N/A;    Family Psychiatric History: Reviewed family  psychiatric history from progress note on 03/24/2021.  Family History:  Family History  Problem Relation Age of Onset   Hypertension Mother    Epilepsy Mother    Anxiety disorder Mother    Depression Mother    Migraines Mother    Diabetes Maternal Grandmother    Stroke Maternal Grandmother    Hypertension Maternal Grandmother    Asthma Half-Brother    Asthma Half-Sister    Breast cancer Neg Hx     Social History: Reviewed social history from progress note on 03/24/2021. Social History   Socioeconomic History   Marital status: Single    Spouse name: Not on file   Number of children: 1   Years of education: Not on file   Highest education level: GED or equivalent  Occupational History   Not on file  Tobacco Use   Smoking status: Former    Types: E-cigarettes    Start date: 2021   Smokeless tobacco: Never  Vaping Use   Vaping Use: Every day   Substances: Nicotine, THC  Substance and Sexual Activity   Alcohol use: Yes    Comment: socially   Drug use: Yes    Frequency: 3.0 times per week    Types: Marijuana    Comment: daily   Sexual activity: Yes    Partners: Male    Birth control/protection: Condom  Other Topics Concern   Not on file  Social History Narrative   Not on file   Social Determinants of Health   Financial Resource Strain: Not on file  Food Insecurity: Not on file  Transportation Needs: Not on file  Physical Activity: Not on file  Stress: Not on file  Social Connections: Not on file    Allergies: No Known Allergies  Metabolic Disorder Labs: Lab Results  Component Value Date   HGBA1C 5.0 10/12/2020   MPG 96.8 10/12/2020   No results found for: PROLACTIN Lab Results  Component Value Date   CHOL 109 12/03/2020   TRIG 110 12/03/2020   HDL 36 (L) 12/03/2020   CHOLHDL 3.0 12/03/2020   VLDL 11 10/12/2020   LDLCALC 53 12/03/2020   LDLCALC 117 (H) 10/12/2020   Lab Results  Component Value Date   TSH 0.821 01/13/2021    Therapeutic  Level Labs: No results found for: LITHIUM No results found for: VALPROATE No components found for:  CBMZ  Current Medications: Current Outpatient Medications  Medication Sig Dispense Refill   aspirin EC 81 MG tablet Take 1 tablet (81 mg total) by mouth daily. 90 tablet 3   atorvastatin (LIPITOR) 80 MG tablet Take 1 tablet (80 mg total) by mouth daily. 90 tablet 3   cyclobenzaprine (FLEXERIL) 10 MG tablet Take 1 tablet (10 mg total) by mouth at bedtime. 30 tablet 0   hydrOXYzine (VISTARIL) 50 MG capsule Take 1 capsule (50 mg total) by mouth 2 (two) times daily as needed. 30 capsule 1   venlafaxine XR (EFFEXOR XR) 75 MG 24 hr capsule Take 1 capsule (75  mg total) by mouth daily with breakfast. 90 capsule 1   Vitamin D, Ergocalciferol, (DRISDOL) 1.25 MG (50000 UNIT) CAPS capsule Take 1 capsule (50,000 Units total) by mouth every 7 (seven) days. 12 capsule 0   mirtazapine (REMERON) 7.5 MG tablet Take 1 tablet (7.5 mg total) by mouth at bedtime. 30 tablet 1   No current facility-administered medications for this visit.     Musculoskeletal: Strength & Muscle Tone: within normal limits Gait & Station: normal Patient leans: N/A  Psychiatric Specialty Exam: Review of Systems  Psychiatric/Behavioral:  Positive for decreased concentration, dysphoric mood and sleep disturbance. The patient is nervous/anxious.   All other systems reviewed and are negative.  Blood pressure 107/72, pulse 79, temperature 98.8 F (37.1 C), temperature source Temporal, weight 204 lb 9.6 oz (92.8 kg), last menstrual period 03/24/2021.Body mass index is 31.11 kg/m.  General Appearance: Casual  Eye Contact:  Fair  Speech:  Clear and Coherent  Volume:  Normal  Mood:  Anxious and Depressed  Affect:  Congruent  Thought Process:  Goal Directed and Descriptions of Associations: Intact  Orientation:  Full (Time, Place, and Person)  Thought Content: Logical   Suicidal Thoughts:  No  Homicidal Thoughts:  No  Memory:   Immediate;   Fair Recent;   Fair Remote;   Fair  Judgement:  Fair  Insight:  Shallow  Psychomotor Activity:  Normal  Concentration:  Concentration: Fair and Attention Span: Fair  Recall:  [employer] of Knowledge: Fair  Language: Fair  Akathisia:  No  Handed:  Right  AIMS (if indicated): not done  Assets:  Communication Skills Desire for Improvement Housing Transportation  ADL's:  Intact  Cognition: WNL  Sleep:  Poor   Screenings: GAD-7    Flowsheet Row Office Visit from 03/27/2021 in [facility] Office Visit from 03/24/2021 in [facility] Office Visit from 02/24/2021 in [facility] Office Visit from 02/10/2021 in [facility] Office Visit from 01/13/2021 in [facility]  Total GAD-7 Score 20 21 18 21 21       PHQ2-9    Flowsheet Row Office Visit from 04/21/2021 in [facility] Office Visit from 04/18/2021 in [facility] Department Office Visit from 03/27/2021 in [facility] Office Visit from 03/24/2021 in [facility] Office Visit from 03/21/2021 in [facility]  PHQ-2 Total Score 4 2 6 6 2   PHQ-9 Total Score 15 -- 19 22 --      Flowsheet Row Office Visit from 04/21/2021 in [facility] Office Visit from 03/24/2021 in [facility] ED from 11/13/2020 in [facility] EMERGENCY DEPARTMENT  C-SSRS RISK CATEGORY No Risk No Risk No Risk        Assessment and Plan: Kristen Grant is a 26 year old Caucasian female, single, employed, lives in [place] has a history of GAD, depression unspecified, cannabis abuse, history of right MCA infarct with residual mild fine motor deficits on the left side was evaluated in office today.  Patient has been noncompliant with recommendations as well as continues to have substance abuse  problems, will benefit from the following plan.  Plan  GAD-unstable Continue Effexor XR 75 mg p.o. daily with breakfast Hydroxyzine 50 mg p.o. twice daily as needed Referral for CBT-motivated to start therapy. Referred for IOP-noncompliant.  Depressive disorder-unstable-rule out MDD versus substance induced likely due to cannabis abuse-unstable Continue mirtazapine 7.5 mg p.o. nightly-was noncompliant however  reports she is agreeable to give it another trial. Continue Effexor as prescribed Referred for CBT-has upcoming appointment.  Cannabis use disorder severe-unstable Provided counseling. Ordered urine drug screen-pending.  Has been noncompliant. Refer for CBT  Nicotine use disorder-unstable We will monitor closely  Benzodiazepine abuse in remission Will monitor closely  History of opioid abuse-we will monitor closely  Noncompliance with treatment plan-provided education.  Encouraged compliance.  Follow-up in clinic in 3 months or sooner if needed.  Collaboration of Care: Collaboration of Care: Referral or follow-up with counselor/therapist AEB encouraged to have therapy  Patient/Guardian was advised Release of Information must be obtained prior to any record release in order to collaborate their care with an outside provider. Patient/Guardian was advised if they have not already done so to contact the registration department to sign all necessary forms in order for [place] to release information regarding their care.   Consent: Patient/Guardian gives verbal consent for treatment and assignment of benefits for services provided during this visit. Patient/Guardian expressed understanding and agreed to proceed.   This note was generated in part or whole with voice recognition software. Voice recognition is usually quite accurate but there are transcription errors that can and very often do occur. I apologize for any typographical errors that were not detected and  corrected.      Jomarie Longs, MD 04/22/2021, 5:30 PM

## 2021-04-21 NOTE — Progress Notes (Signed)
?[facility] ?[id number] ?[place]. [place] [id number] ?[phone] ? ?     STROKE FOLLOW UP NOTE ? ?Ms. Kristen Grant ?Date of Birth:  12/08/1995 ?Medical Record Number:  [id number]  ? ?Reason for Referral: stroke follow up ? ? ? ?SUBJECTIVE: ? ? ?CHIEF COMPLAINT:  ?Chief Complaint  ?Patient presents with  ? Follow-up  ?  Rm 3 alone ?Pt is well and stable, no new concerns   ? ? ?HPI:  ? ?Update 04/21/2021 JM: 26 year old female who returns for stroke follow-up unaccompanied.  Overall doing well since prior visit.  She has since completed therapies reporting residual left hand and toe numbness (especially if gets cold) and mild left hand weakness with occasional twitching/tremors with fatigue and occasional left foot slapping/dragging with fatigue. She does admit to not routinely doing any exercises as advised by therapies.  She has since returned back to work at [employer] and driving as well as maintaining ADLs and IADLs independently.  Denies new stroke/TIA symptoms.  She underwent PFO closure on 12/10/2020 without complication.  Migraine headaches greatly improved since PFO closure and essentially resolved - only 2 headaches since closure.  She has since stopped topiramate as no longer needed.  She is closely being followed by psychiatry for depression/anxiety currently on Vistaril, mirtazapine and venlafaxine.  Compliant on atorvastatin without side effects. She has since stopped aspirin as she was told this was no longer needed. She did have some mild bruising and cold sensation while taking. Blood pressure today 117/70.  She has since establish care with PCP Kristen GroomsKaren Holdsworth, NP and following with OB/GYN.  No further concerns at this time.  ? ? ? ? ? ?History provided for reference purposes only ?Update 12/03/2020 JM: Ms. Kristen DuffBaize is being seen for initial hospital stroke follow-up unaccompanied. She has been making excellent recovery.  Continues to work with PT/OT at [facility] 2x weekly. Reports  continued mild left arm weakness but more so difficulty with fine motor skills. Also reports decreased activity tolerance and complaints of fatigue - notices LLE dragging at that time. Also c/o right lower back pain. Reports speaking with therapy re: AFO brace but has not heard anything else.  Currently on short-term disability which ends 11/6.  She is looking forward to returning back to work at [employer] but she is unsure if she will be abel to return 11/6 as she is scheduled for PFO closure on 11/8. She has since returned back to driving without difficulty. Able to maintain majority of ADLs and IADLs independently. Headaches well controlled on topiramate 25 mg twice daily but ran out of rx 3-4 days ago and has noticed slight increase in headaches.  ? ?Denies new stroke/TIA symptoms. ? ?Reports compliance on aspirin 81 mg daily but recently ran out of atorvastatin. Denies side effects.  Blood pressure today 110/75. Eval by Dr. Jacinto HalimGanji on 11/29/2020 for PFO closure - she is scheduled for closure on 12/10/2020. Appt scheduled 11/14 with Kristen PickleLauren McElwee, NP to establish care as PCP. ? ?No further concerns at this time ? ? ? ?Stroke admission 10/11/2020 ?Kristen Grant is a 26 y.o. female with PMHx of anxiety, asthma, migraine headaches and on birth control pills who presented on 10/11/2020 with left-sided weakness.  On arrival to ED, slight right gaze deviation, left facial droop, dysarthria and inability to move left side as well as c/o right-sided headache.  Personally reviewed hospitalization pertinent progress notes, lab work and imaging.  Evaluated by Dr. Pearlean BrownieSethi.  Stroke  work-up revealed right MCA infarct in setting of right M1 occlusion s/p IV TNKase and mechanical thrombectomy with TICI 3 revascularization require repeat thrombectomy 24 hours later for reocclusion initiate pain TICI 2B-2C revascularization, infarct embolic secondary to cryptogenic etiology. MRI brain showed right MCA territory infarcts  predominantly involving right caudate, putamen, anterior limb of R IC, insula, operculum and overlying right frontal lobe as well as a few punctate acute infarcts in the right parietal lobe as well as associated cytotoxic edema with slight (2-41mm) leftward midline shift at the foramen of Monro and probable small amount of hemorrhage in the posterior right frontal lobe/insular region.  EF 60 to 65%.  LDL 117.  A1c 5.0.  Hypercoagulable labs negative.  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 LE Dopplers negative for DVT. TEE noted small PFO.  Placed on aspirin 81 mg daily and atorvastatin 80 mg daily.  UDS positive for THC.  Recommended discontinuing birth control pill and to follow-up with OB/GYN for alternative long-term contraceptive methods.  No prior stroke history.  Therapy eval's recommended CIR for residual left hemiparesis and dysarthria. ? ? ? ? ? ?PERTINENT IMAGING ? ?Code Stroke  CT head hypodensity at right anterior temporal lobe.   ASPECTS 10.     ?CTA head & neck:   ?1. Thrombus in the distal right M1 segment with near complete occlusion. There is reconstitution of flow in superior M2 division but no inferior M2 division is identified. ?2. 11 cc infarct core in the right corona radiata, 75 cc penumbra. ?CT perfusion : CBF (<30%) Volume: 35mL ?Perfusion (Tmax>6.0s) volume: 30mL ?Mismatch Volume: 42mL ?Infarction Location:Right corona radiata ?Cerebral angio : report from Dr. Corliss Skains ? bilateral common carotid and Lt VA angiograms followed by complete revascularization of occluded LT MCA M1 seg with x pass with contact aspiration and x1 pass with solitaire 98mm x 40 mm retriever and contact aspiration achieving a TICI 3 revascularization. Post CT brain No ICH . ?5.MRI BRAIN:  ? Acute anterior right MCA territory infarct, predominantly ?involving the right caudate, putamen, anterior limb of the right internal capsule, insula, operculum and overlying right frontal lobe. A few  punctate acute infarcts in the right parietal lobe. ? Associated cytotoxic edema with slight (2-3 mm) leftward midline shift at the foramen of Monro. ?Probable small amount of hemorrhage in the posterior right frontal lobe/insular region. ?6. 2D Echo LVEF 60-65%, normal LA, poorly visualized interatrial septum  ?7. LDL 117 ?8.HgbA1c 5.0 ? ? ? ?ROS:   ?14 system review of systems performed and negative with exception of those listed in HPI ? ?PMH:  ?Past Medical History:  ?Diagnosis Date  ? Anxiety   ? Asthma   ? no treatments required at this time  ? Depression   ? Migraine headache   ? 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 Stroke [facility])   ? ? ?PSH:  ?Past Surgical History:  ?Procedure Laterality Date  ? BUBBLE STUDY  10/17/2020  ? Procedure: BUBBLE STUDY;  Surgeon: Jake Bathe, MD;  [facility];  Service: Cardiovascular;;  ? IR ANGIO INTRA EXTRACRAN SEL COM CAROTID INNOMINATE UNI L MOD SED  10/11/2020  ? IR ANGIO VERTEBRAL SEL VERTEBRAL UNI L MOD SED  10/11/2020  ? IR CT HEAD LTD  10/11/2020  ? IR CT HEAD LTD  10/12/2020  ? IR PERCUTANEOUS ART THROMBECTOMY/INFUSION INTRACRANIAL INC DIAG ANGIO  10/11/2020  ? IR PERCUTANEOUS ART  THROMBECTOMY/INFUSION INTRACRANIAL INC DIAG ANGIO  10/12/2020  ? NO PAST SURGERIES    ? PATENT FORAMEN OVALE(PFO) CLOSURE N/A 12/10/2020  ? Procedure: PATENT FORAMEN OVALE (PFO) CLOSURE;  Surgeon: Yates Decamp, MD;  [facility] INVAS[facility] CV LAB;  Service: Cardiovascular;  Laterality: N/A;  ? RADIOLOGY WITH ANESTHESIA N/A 10/12/2020  ? Procedure: RADIOLOGY WITH ANESTHESIA;  Surgeon: Radiologist, Medication, MD;  Location: [facility] OR;  Service: Radiology;  Laterality: N/A;  ? RADIOLOGY WITH ANESTHESIA N/A 10/11/2020  ? Procedure: IR WITH ANESTHESIA;  Surgeon: Radiologist, Medication, MD;  Location: [facility] OR;  Service: Radiology;  Laterality: N/A;  ? TEE WITHOUT CARDIOVERSION N/A 10/17/2020  ? Procedure: TRANSESOPHAGEAL ECHOCARDIOGRAM (TEE);  Surgeon:  Jake Bathe, MD;  Location: [facility] ENDOSCOPY;  Service: Cardiovascular;  Laterality: N/A;  ? ? ?Social History:  ?Social History  ? ?Socioeconomic History  ? Marital status: Single  ?  Spouse name: Not on file  ? Number o

## 2021-04-21 NOTE — Patient Instructions (Addendum)
restart aspirin 81 mg daily  and continue atorvastatin for secondary stroke prevention ? ?Discuss use of gabapentin with psychiatry as this can help both your pain and foot pain as well as anxiety  ? ?Continue routine follow-up with Dr. Jacinto Halim post PFO closure ? ?Continue to follow up with PCP regarding cholesterol management  ?Maintain strict control of cholesterol with LDL cholesterol (bad cholesterol) goal below 70 mg/dL.  ? ?Signs of a Stroke? Follow the BEFAST method:  ?Balance Watch for a sudden loss of balance, trouble with coordination or vertigo ?Eyes Is there a sudden loss of vision in one or both eyes? Or double vision?  ?Face: Ask the person to smile. Does one side of the face droop or is it numb?  ?Arms: Ask the person to raise both arms. Does one arm drift downward? Is there weakness or numbness of a leg? ?Speech: Ask the person to repeat a simple phrase. Does the speech sound slurred/strange? Is the person confused ? ?Time: If you observe any of these signs, call 911. ? ? ? ? ? ? ? ?Thank you for coming to see [place] at [facility]. I hope we have been able to provide you high quality care today. ? ?You may receive a patient satisfaction survey over the next few weeks. We would appreciate your feedback and comments so that we may continue to improve ourselves and the health of our patients. ? ?

## 2021-04-22 DIAGNOSIS — F32A Depression, unspecified: Secondary | ICD-10-CM | POA: Insufficient documentation

## 2021-04-22 DIAGNOSIS — Z91199 Patient's noncompliance with other medical treatment and regimen due to unspecified reason: Secondary | ICD-10-CM | POA: Insufficient documentation

## 2021-04-25 LAB — URINE DRUGS OF ABUSE SCREEN W ALC, ROUTINE (REF LAB)
Amphetamines, Urine: NEGATIVE ng/mL
Barbiturate, Ur: NEGATIVE ng/mL
Benzodiazepine Quant, Ur: NEGATIVE ng/mL
Cocaine (Metab.): NEGATIVE ng/mL
Ethanol U, Quan: NEGATIVE %
Methadone Screen, Urine: NEGATIVE ng/mL
Opiate Quant, Ur: NEGATIVE ng/mL
Phencyclidine, Ur: NEGATIVE ng/mL
Propoxyphene, Urine: NEGATIVE ng/mL

## 2021-04-25 LAB — PANEL 799049
CARBOXY THC GC/MS CONF: 244 ng/mL
Cannabinoid GC/MS, Ur: POSITIVE — AB

## 2021-04-30 LAB — IGP, RFX APTIMA HPV ASCU: PAP Smear Comment: 0

## 2021-04-30 LAB — HPV APTIMA: HPV Aptima: POSITIVE — AB

## 2021-05-05 ENCOUNTER — Telehealth: Payer: Self-pay

## 2021-05-05 ENCOUNTER — Ambulatory Visit (LOCAL_COMMUNITY_HEALTH_CENTER): Payer: Managed Care, Other (non HMO) | Admitting: Advanced Practice Midwife

## 2021-05-05 DIAGNOSIS — Z3009 Encounter for other general counseling and advice on contraception: Secondary | ICD-10-CM

## 2021-05-05 DIAGNOSIS — R87612 Low grade squamous intraepithelial lesion on cytologic smear of cervix (LGSIL): Secondary | ICD-10-CM

## 2021-05-05 NOTE — Progress Notes (Signed)
Counseled regarding PAP results. Hart Carwin RN aware of need for colpo referral. Jossie Ng, RN ? ?

## 2021-05-05 NOTE — Telephone Encounter (Signed)
Telephone call to patient today to discuss Colpo referral needed r/t abnormal PAP.  Left message for patient to return my call at [phone].  [name], RN ? ?

## 2021-05-05 NOTE — Progress Notes (Signed)
[facility] DEPARTMENT ?[facility] ?[address]- [employer] ?Main Number: [phone] ? ?Contraception/Family Planning VISIT ENCOUNTER NOTE ? ?Subjective:  ? Kristen Grant is a 26 y.o.SWF vaper G1P1001 (7 yo son) female here for reproductive life counseling. The patient is currently using Female Condom to prevent pregnancy.  Wanting Paraguard insertion. Last physical 04/18/21. LMP 04/20/21. Last sex 05/03/21 with condom; with current partner x 3 mo; 5 partners in last 3 mo. 04/30/21 sex with condom. Denies cigs. Vapes currently. MJ daily. Last cigar 05/03/21. Last ETOH 05/03/21 (3 [place]) 1-2x/mo.  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 Long history abnormal paps ? ?The patient does not want a pregnancy in the next year.   ? ?Client states they are looking for the following:  High efficacy at preventing pregnancy ? ?Denies abnormal vaginal bleeding, discharge, pelvic pain, problems with intercourse or other gynecologic concerns.  ?  ?Gynecologic History ?Patient's last menstrual period was 04/20/2021 (exact date). ? ?Health Maintenance Due  ?Topic Date Due  ? COVID-19 Vaccine (1) Never done  ? ? ? ?The following portions of the patient's history were reviewed and updated as appropriate: allergies, current medications, past family history, past medical history, past social history, past surgical history and problem list. ? ?Review of Systems ?Pertinent items are noted in HPI. ?  ?Objective:  ?BP 108/69   Pulse 73   Ht 5\' 8"  (1.727 m)   Wt 202 lb 9.6 oz (91.9 kg)   LMP 04/20/2021 (Exact Date) Comment: Normal  BMI 30.81 kg/m?  ?Gen: well appearing, NAD ?HEENT: no scleral icterus ?CV: RR ?Lung: Normal WOB ?Ext: warm well perfused ? ?PELVIC: Normal appearing external genitalia; normal appearing vaginal mucosa and cervix.  No abnormal discharge noted.   Normal uterine size, no other palpable masses, no uterine or adnexal tenderness. ? ? ?Assessment and Plan:  ? ?Need for  emergency contraceptive care was assessed today. ? ?Last unprotected sex was:  05/03/21 with condom; with current partner x 3 mo. 04/30/21 with condom ? ?Patient reported sex with condom.  Reviewed options and patient desired No method of ECP, declined all   ? ?Contraception counseling: Reviewed methods in a patient centered fashion and used shared decision making with the patient. Utilized Upstream patient education tools as appropriate. ?The patient stated there goals and desires from a method are: Other no hormones as had 2 strokes last year which her doctors told her was caused by DMPA. ? ?We reviewed the following methods in detail based on patient preferences available included: ?IUD or IUS ? ?Patient expressed they would like IUD or IUS or condoms ? ?This was not provided to the patient today.  Pt has a long history of abnormal paps with most recent 04/18/21 ASCUS HPV+. Colpo being scheduled. Discussed pros and cons of IUD insertion today vs waiting to see what MD doing colpo recommends based on his tx. Pt wants to continue with condoms and wait until colpo to have insertion vs risking needing IUD removed for possible LEEP or surgical procedure. ? ?Risks, benefits, and typical effectiveness rates were reviewed.  Questions were answered.  Written information was also given to the patient to review.   ? ? ?she/her/hers  will follow up in  after impending colpo  for surveillance.  she/her/hers was told to call with any further questions, or with any concerns about this method of contraception or cycle control.  Emphasized use of condoms 100% of the  time for STI prevention. ? ? ?1. 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  ? ?Please refer to After Visit Summary for other counseling recommendations.  ? ?No follow-ups on file. ? ?Alberteen Spindle, CNM ?[facility] DEPARTMENT ?

## 2021-05-07 ENCOUNTER — Ambulatory Visit (INDEPENDENT_AMBULATORY_CARE_PROVIDER_SITE_OTHER): Payer: 59 | Admitting: Licensed Clinical Social Worker

## 2021-05-07 DIAGNOSIS — Z91198 Patient's noncompliance with other medical treatment and regimen for other reason: Secondary | ICD-10-CM

## 2021-05-07 NOTE — Progress Notes (Signed)
Pt showed up to appointment 20 minutes late for new pt appointment--LCSW clinician needs full 1 hour appt.  ? ?Appointment will be rescheduled to another 1 hour time slot.  ?

## 2021-05-07 NOTE — Telephone Encounter (Signed)
Patient returned the call to [facility].  Kristen Grant intercepted the call and helped facilitate information regarding the desires for her Colpo.  Due to patient being insured she would like her Colpo done at [facility].  [facility] referral completed today with confirmation.  [name], RN ? ?

## 2021-05-21 ENCOUNTER — Ambulatory Visit (INDEPENDENT_AMBULATORY_CARE_PROVIDER_SITE_OTHER): Payer: Self-pay | Admitting: Licensed Clinical Social Worker

## 2021-05-21 ENCOUNTER — Telehealth: Payer: Self-pay | Admitting: Licensed Clinical Social Worker

## 2021-05-21 DIAGNOSIS — Z91199 Patient's noncompliance with other medical treatment and regimen due to unspecified reason: Secondary | ICD-10-CM

## 2021-05-21 NOTE — Progress Notes (Signed)
LCSW counselor tried to connect with patient for scheduled in office appointment. ? ?As of 10:23am pt has not shown up in the office so visit will be coded as No Show. ? ?Called pt at 10:23am and LMVM to call office to reschedule appt.  ?

## 2021-05-21 NOTE — Telephone Encounter (Signed)
LCSW counselor tried to connect with patient for scheduled in office appointment. ? ?As of 10:23am pt has not shown up in the office so visit will be coded as No Show. ? ?Called pt at 10:23am and LMVM to call office to reschedule appt.  ?

## 2021-05-26 ENCOUNTER — Encounter: Payer: Self-pay | Admitting: Nurse Practitioner

## 2021-05-26 ENCOUNTER — Ambulatory Visit (INDEPENDENT_AMBULATORY_CARE_PROVIDER_SITE_OTHER): Payer: Managed Care, Other (non HMO) | Admitting: Nurse Practitioner

## 2021-05-26 VITALS — BP 110/72 | HR 59 | Temp 98.8°F | Wt 198.6 lb

## 2021-05-26 DIAGNOSIS — F411 Generalized anxiety disorder: Secondary | ICD-10-CM

## 2021-05-26 DIAGNOSIS — Z8673 Personal history of transient ischemic attack (TIA), and cerebral infarction without residual deficits: Secondary | ICD-10-CM | POA: Diagnosis not present

## 2021-05-26 DIAGNOSIS — F321 Major depressive disorder, single episode, moderate: Secondary | ICD-10-CM | POA: Diagnosis not present

## 2021-05-26 MED ORDER — VENLAFAXINE HCL ER 37.5 MG PO CP24: 37.5000 mg | ORAL_CAPSULE | Freq: Every day | ORAL | 0 refills | Status: DC

## 2021-05-26 NOTE — Progress Notes (Signed)
? ?Established Patient Office Visit ? ?Subjective:  ?Patient ID: Kristen Grant, female    DOB: 09-05-1995  Age: 26 y.o. MRN: [id number] ? ?CC:  ?Chief Complaint  ?Patient presents with  ? Depression  ? Anxiety  ? ? ?HPI ?Kristen Grant presents for follow up on depression and anxiety.  ? ?ANXIETY/STRESS ?Patient states she is not taking any of her medication.  States she just hasn't been on track.  She just hasn't been diligent about taking them.  Patient states she is still seeing Dr. [name].  She missed her most recent appointment for therapy.  Her next appointment with Dr. [name] is in June.  She denies concerns at visit today.   ? ? ? ?  05/26/2021  ?  2:03 PM 04/21/2021  ?  2:59 PM 04/18/2021  ?  8:44 AM 03/27/2021  ?  2:10 PM 03/24/2021  ?  5:17 PM  ?Depression screen PHQ 2/9  ?Decreased Interest 1  1 3    ?Down, Depressed, Hopeless 1  1 3    ?PHQ - 2 Score 2  2 6    ?Altered sleeping 3   3   ?Tired, decreased energy 3   3   ?Change in appetite 3   3   ?Feeling bad or failure about yourself  1   1   ?Trouble concentrating 2   2   ?Moving slowly or fidgety/restless 2   1   ?Suicidal thoughts 1   0   ?PHQ-9 Score 17   19   ?Difficult doing work/chores Somewhat difficult   Very difficult   ?  ? Information is confidential and restricted. Go to Review Flowsheets to unlock data.  ? ? ?  05/26/2021  ?  2:03 PM 03/27/2021  ?  2:10 PM 03/24/2021  ?  5:18 PM 02/24/2021  ? 11:01 AM  ?GAD 7 : Generalized Anxiety Score  ?Nervous, Anxious, on Edge 3 3  3   ?Control/stop worrying 3 3  3   ?Worry too much - different things 3 3  3   ?Trouble relaxing 3 3  3   ?Restless 2 3  2   ?Easily annoyed or irritable 3 3  3   ?Afraid - awful might happen 1 2  1   ?Total GAD 7 Score 18 20  18   ?Anxiety Difficulty Somewhat difficult Very difficult  Extremely difficult  ?  ? Information is confidential and restricted. Go to Review Flowsheets to unlock data.  ? ? ?Past Medical History:  ?Diagnosis Date  ? Anxiety   ? Asthma   ? no treatments required at this  time  ? Depression   ? Migraine headache   ? 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 Stroke [facility])   ? ? ?Past Surgical History:  ?Procedure Laterality Date  ? BUBBLE STUDY  10/17/2020  ? Procedure: BUBBLE STUDY;  Surgeon: Jerline Pain, MD;  Location: [facility] ENDOSCOPY;  Service: Cardiovascular;;  ? IR ANGIO INTRA EXTRACRAN SEL COM CAROTID INNOMINATE UNI L MOD SED  10/11/2020  ? IR ANGIO VERTEBRAL SEL VERTEBRAL UNI L MOD SED  10/11/2020  ? IR CT HEAD LTD  10/11/2020  ? IR CT HEAD LTD  10/12/2020  ? IR PERCUTANEOUS ART THROMBECTOMY/INFUSION INTRACRANIAL INC DIAG ANGIO  10/11/2020  ? IR PERCUTANEOUS ART THROMBECTOMY/INFUSION INTRACRANIAL INC DIAG ANGIO  10/12/2020  ? NO PAST SURGERIES    ? PATENT FORAMEN OVALE(PFO) CLOSURE N/A 12/10/2020  ? Procedure: PATENT FORAMEN OVALE (PFO) CLOSURE;  Surgeon: Adrian Prows, MD;  Location: [place] [facility];  Service: Cardiovascular;  Laterality: N/A;  ? RADIOLOGY WITH ANESTHESIA N/A 10/12/2020  ? Procedure: RADIOLOGY WITH ANESTHESIA;  Surgeon: Radiologist, Medication, MD;  Location: [place];  Service: Radiology;  Laterality: N/A;  ? RADIOLOGY WITH ANESTHESIA N/A 10/11/2020  ? Procedure: IR WITH ANESTHESIA;  Surgeon: Radiologist, Medication, MD;  Location: [place];  Service: Radiology;  Laterality: N/A;  ? TEE WITHOUT CARDIOVERSION N/A 10/17/2020  ? Procedure: TRANSESOPHAGEAL ECHOCARDIOGRAM (TEE);  Surgeon: Jerline Pain, MD;  Location: [facility] ENDOSCOPY;  Service: Cardiovascular;  Laterality: N/A;  ? ? ?Family History  ?Problem Relation Age of Onset  ? Hypertension Mother   ? Epilepsy Mother   ? Anxiety disorder Mother   ? Depression Mother   ? Migraines Mother   ? Diabetes Maternal Grandmother   ? Stroke Maternal Grandmother   ? Hypertension Maternal Grandmother   ? Asthma Half-Brother   ? Asthma Half-Sister   ? Breast cancer Neg Hx   ? ? ?Social History  ? ?Socioeconomic History  ? Marital status: Single  ?  Spouse name: Not on file  ? Number of  children: 1  ? Years of education: Not on file  ? Highest education level: GED or equivalent  ?Occupational History  ? Not on file  ?Tobacco Use  ? Smoking status: Former  ?  Types: E-cigarettes  ?  Start date: 2021  ? Smokeless tobacco: Never  ?Vaping Use  ? Vaping Use: Every day  ? Substances: Nicotine, THC  ?Substance and Sexual Activity  ? Alcohol use: Yes  ?  Comment: socially  ? Drug use: Yes  ?  Frequency: 3.0 times per week  ?  Types: Marijuana  ?  Comment: daily  ? Sexual activity: Yes  ?  Partners: Male  ?  Birth control/protection: Condom  ?Other Topics Concern  ? Not on file  ?Social History Narrative  ? Not on file  ? ?Social Determinants of Health  ? ?Financial Resource Strain: Not on file  ?Food Insecurity: Not on file  ?Transportation Needs: Not on file  ?Physical Activity: Not on file  ?Stress: Not on file  ?Social Connections: Not on file  ?Intimate Partner Violence: Not At Risk  ? Fear of Current or Ex-Partner: No  ? Emotionally Abused: No  ? Physically Abused: No  ? Sexually Abused: No  ? ? ?Outpatient Medications Prior to Visit  ?Medication Sig Dispense Refill  ? aspirin EC 81 MG tablet Take 1 tablet (81 mg total) by mouth daily. (Patient not taking: Reported on 05/26/2021) 90 tablet 3  ? atorvastatin (LIPITOR) 80 MG tablet Take 1 tablet (80 mg total) by mouth daily. (Patient not taking: Reported on 05/26/2021) 90 tablet 3  ? hydrOXYzine (VISTARIL) 50 MG capsule Take 1 capsule (50 mg total) by mouth 2 (two) times daily as needed. (Patient not taking: Reported on 05/05/2021) 30 capsule 1  ? mirtazapine (REMERON) 7.5 MG tablet Take 1 tablet (7.5 mg total) by mouth at bedtime. (Patient not taking: Reported on 05/26/2021) 30 tablet 1  ? Vitamin D, Ergocalciferol, (DRISDOL) 1.25 MG (50000 UNIT) CAPS capsule Take 1 capsule (50,000 Units total) by mouth every 7 (seven) days. (Patient not taking: Reported on 05/26/2021) 12 capsule 0  ? cyclobenzaprine (FLEXERIL) 10 MG tablet Take 1 tablet (10 mg total) by  mouth at bedtime. (Patient not taking: Reported on 05/26/2021) 30 tablet 0  ? venlafaxine XR (EFFEXOR XR) 75 MG 24 hr capsule Take  1 capsule (75 mg total) by mouth daily with breakfast. (Patient not taking: Reported on 05/26/2021) 90 capsule 1  ? ?No facility-administered medications prior to visit.  ? ? ?No Known Allergies ? ?ROS ?Review of Systems  ?Respiratory: Negative.    ?Cardiovascular: Negative.   ?Gastrointestinal: Negative.   ?Genitourinary: Negative.   ?Musculoskeletal: Negative.   ?Skin: Negative.   ?Neurological: Negative.   ?Psychiatric/Behavioral:  Positive for dysphoric mood. Negative for suicidal ideas. The patient is nervous/anxious.   ? ?  ?Objective:  ?  ?Physical Exam ?Vitals and nursing note reviewed.  ?Constitutional:   ?   General: She is not in acute distress. ?   Appearance: Normal appearance.  ?HENT:  ?   Head: Normocephalic and atraumatic.  ?Eyes:  ?   Conjunctiva/sclera: Conjunctivae normal.  ?Cardiovascular:  ?   Rate and Rhythm: Normal rate and regular rhythm.  ?   Pulses: Normal pulses.  ?   Heart sounds: Normal heart sounds.  ?Pulmonary:  ?   Effort: Pulmonary effort is normal.  ?   Breath sounds: Normal breath sounds.  ?Musculoskeletal:  ?   Cervical back: Normal range of motion.  ?Skin: ?   General: Skin is warm and dry.  ?Neurological:  ?   General: No focal deficit present.  ?   Mental Status: She is alert and oriented to person, place, and time.  ?Psychiatric:     ?   Mood and Affect: Mood normal.     ?   Behavior: Behavior normal.     ?   Thought Content: Thought content normal.     ?   Judgment: Judgment normal.  ? ? ?BP 110/72   Pulse (!) 59   Temp 98.8 ?F (37.1 ?C) (Oral)   Wt 198 lb 9.6 oz (90.1 kg)   LMP 05/17/2021   SpO2 99%   BMI 30.20 kg/m?  ?Wt Readings from Last 3 Encounters:  ?05/26/21 198 lb 9.6 oz (90.1 kg)  ?05/05/21 202 lb 9.6 oz (91.9 kg)  ?04/21/21 205 lb (93 kg)  ? ? ? ?Health Maintenance Due  ?Topic Date Due  ? COVID-19 Vaccine (1) Never done  ? ? ?There  are no preventive care reminders to display for this patient. ? ? ?Lab Results  ?Component Value Date  ? TSH 0.821 01/13/2021  ? ?Lab Results  ?Component Value Date  ? WBC 8.2 12/06/2020  ? HGB 13.0 12/06/2020  ? HCT 3

## 2021-05-26 NOTE — Assessment & Plan Note (Signed)
Chronic. Not well controlled.  Patient is not taking any of her medications at this time.  Discussed that she should not restart the Effexor at 75mg .  She does agree to restart at 37.5mg  daily.  Will send in new prescription to the pharmacy.  Follow up in 1 month for reevaluation.  Continue to follow up with psychiatry.  Call sooner if concerns arise.  ?

## 2021-05-26 NOTE — Assessment & Plan Note (Signed)
Encouraged patient to restart her ASA and Atorvastatin. ?

## 2021-05-26 NOTE — Assessment & Plan Note (Signed)
Chronic. Not well controlled.  Patient is not taking any of her medications at this time.  Discussed that she should not restart the Effexor at 75mg.  She does agree to restart at 37.5mg daily.  Will send in new prescription to the pharmacy.  Follow up in 1 month for reevaluation.  Continue to follow up with psychiatry.  Call sooner if concerns arise.  ?

## 2021-06-05 NOTE — Progress Notes (Signed)
? ? ? ?  GYNECOLOGY OFFICE COLPOSCOPY PROCEDURE NOTE ? ?26 y.o. G1P1001 here for colposcopy for ASCUS with POSITIVE high risk HPV pap smear on 04/18/2021. Also Discussed role for HPV in cervical dysplasia, need for surveillance. ? ?Patient gave informed written consent, time out was performed.  Placed in lithotomy position. Cervix viewed with speculum and colposcope after application of acetic acid.  ? ?Colposcopy adequate? Yes ? no visible lesions, no mosaicism, no punctation, and no abnormal vasculature; no corresponding biopsies obtained.  ECC specimen obtained. ?All specimens were labeled and sent to pathology. ? ?Chaperone was present during entire procedure. ? ?Patient was given post procedure instructions.  Will follow up pathology and manage accordingly; patient will be contacted with results and recommendations.  Routine preventative health maintenance measures emphasized. ? ? ? ?Hildred Laser, MD ?[facility] ? ? ? ?  ?

## 2021-06-06 ENCOUNTER — Ambulatory Visit (INDEPENDENT_AMBULATORY_CARE_PROVIDER_SITE_OTHER): Payer: Managed Care, Other (non HMO) | Admitting: Obstetrics and Gynecology

## 2021-06-06 ENCOUNTER — Other Ambulatory Visit (HOSPITAL_COMMUNITY)
Admission: RE | Admit: 2021-06-06 | Discharge: 2021-06-06 | Disposition: A | Discharge status: Home / Self Care | Payer: Managed Care, Other (non HMO) | Source: Ambulatory Visit | Attending: Obstetrics and Gynecology | Admitting: Obstetrics and Gynecology

## 2021-06-06 ENCOUNTER — Encounter: Payer: Self-pay | Admitting: Obstetrics and Gynecology

## 2021-06-06 VITALS — BP 109/65 | HR 80 | Resp 16 | Ht 69.0 in | Wt 196.3 lb

## 2021-06-06 DIAGNOSIS — R87612 Low grade squamous intraepithelial lesion on cytologic smear of cervix (LGSIL): Secondary | ICD-10-CM | POA: Diagnosis not present

## 2021-06-06 LAB — POCT URINE PREGNANCY: Preg Test, Ur: NEGATIVE

## 2021-06-06 NOTE — Patient Instructions (Signed)
Colposcopy, Care After  The following information offers guidance on how to care for yourself after your procedure. Your health care provider may also give you more specific instructions. If you have problems or questions, contact your health care provider. What can I expect after the procedure? If you had a colposcopy without a biopsy, you can expect to feel fine right away after your procedure. However, you may have some spotting of blood for a few days. You can return to your normal activities. If you had a colposcopy with a biopsy, it is common after the procedure to have: Soreness and mild pain. These may last for a few days. Mild vaginal bleeding or discharge that is dark-colored and grainy. This may last for a few days. The discharge may be caused by a liquid (solution) that was used during the procedure. You may need to wear a sanitary pad during this time. Spotting of blood for at least 48 hours after the procedure. Follow these instructions at home: Medicines Take over-the-counter and prescription medicines only as told by your health care provider. Talk with your health care provider about what type of over-the-counter pain medicines and prescription medicines you can start to take again. It is especially important to talk with your health care provider if you take blood thinners. Activity Avoid using douche products, using tampons, and having sex for at least 3 days after the procedure or for as long as told by your health care provider. Return to your normal activities as told by your health care provider. Ask your health care provider what activities are safe for you. General instructions Ask your health care provider if you may take baths, swim, or use a hot tub. You may take showers. If you use birth control (contraception), continue to use it. Keep all follow-up visits. This is important. Contact a health care provider if: You have a fever or chills. You faint or feel  light-headed. Get help right away if: You have heavy bleeding from your vagina or pass blood clots. Heavy bleeding is bleeding that soaks through a sanitary pad in less than 1 hour. You have vaginal discharge that is abnormal, is yellow in color, or smells bad. This could be a sign of infection. You have severe pain or cramps in your lower abdomen that do not go away with medicine. Summary If you had a colposcopy without a biopsy, you can expect to feel fine right away, but you may have some spotting of blood for a few days. You can return to your normal activities. If you had a colposcopy with a biopsy, it is common to have mild pain for a few days and spotting for 48 hours after the procedure. Avoid using douche products, using tampons, and having sex for at least 3 days after the procedure or for as long as told by your health care provider. Get help right away if you have heavy bleeding, severe pain, or signs of infection. This information is not intended to replace advice given to you by your health care provider. Make sure you discuss any questions you have with your health care provider. Document Revised: 06/16/2020 Document Reviewed: 06/16/2020 Elsevier Patient Education  2023 [employer]vier Inc[employer]  

## 2021-06-06 NOTE — Addendum Note (Signed)
Addended by: Chilton Greathouse on: 06/06/2021 04:15 PM ? ? Modules accepted: Orders ? ?

## 2021-06-10 ENCOUNTER — Telehealth: Payer: Self-pay

## 2021-06-10 LAB — SURGICAL PATHOLOGY

## 2021-06-10 NOTE — Telephone Encounter (Signed)
Telephone call to [facility] today to fpllow-up from 06-06-2021 Colpo with Dr. Marcelline Mates.  Surgical Path result is back, but no f/u information available.  Spoke with Crystal and she is to ask Dr. Marcelline Mates and call me back with f/u plan for PAP Smear's in the future. [name], RN ? ?

## 2021-06-10 NOTE — Telephone Encounter (Signed)
Return call from Crystal at [facility].  She reports that Dr. Valentino Saxon wants a repeat PAP in 1 year 06-2022.  Hart Carwin, RN ? ?

## 2021-06-15 DIAGNOSIS — H65191 Other acute nonsuppurative otitis media, right ear: Secondary | ICD-10-CM | POA: Insufficient documentation

## 2021-06-15 DIAGNOSIS — J45909 Unspecified asthma, uncomplicated: Secondary | ICD-10-CM | POA: Diagnosis not present

## 2021-06-15 DIAGNOSIS — H9201 Otalgia, right ear: Secondary | ICD-10-CM | POA: Diagnosis present

## 2021-06-15 DIAGNOSIS — Z7982 Long term (current) use of aspirin: Secondary | ICD-10-CM | POA: Diagnosis not present

## 2021-06-16 ENCOUNTER — Encounter: Payer: Self-pay | Admitting: Emergency Medicine

## 2021-06-16 ENCOUNTER — Emergency Department
Admission: EM | Admit: 2021-06-16 | Discharge: 2021-06-16 | Disposition: A | Discharge status: Home / Self Care | Payer: Managed Care, Other (non HMO) | Attending: Emergency Medicine | Admitting: Emergency Medicine

## 2021-06-16 DIAGNOSIS — H65191 Other acute nonsuppurative otitis media, right ear: Secondary | ICD-10-CM

## 2021-06-16 MED ORDER — AMOXICILLIN 500 MG PO CAPS
1000.0000 mg | ORAL_CAPSULE | Freq: Once | ORAL | Status: AC
  Administered 2021-06-16: 1000 mg via ORAL
  Filled 2021-06-16: qty 2

## 2021-06-16 MED ORDER — IBUPROFEN 800 MG PO TABS
800.0000 mg | ORAL_TABLET | Freq: Once | ORAL | Status: AC
  Administered 2021-06-16: 800 mg via ORAL
  Filled 2021-06-16: qty 1

## 2021-06-16 MED ORDER — AMOXICILLIN 875 MG PO TABS: 875.0000 mg | ORAL_TABLET | Freq: Two times a day (BID) | ORAL | 0 refills | Status: AC

## 2021-06-16 NOTE — ED Triage Notes (Signed)
Pt here from home with c/o right ear pain that started 2 days ago , tried some otc drops but did not help  ?

## 2021-06-16 NOTE — ED Provider Notes (Signed)
? ?[facility] ?Provider Note ? ? ? Event Date/Time  ? First MD Initiated Contact with Patient 06/16/21 0539   ?  (approximate) ? ? ?History  ? ?Ear Fullness ? ? ?HPI ? ?Kristen Grant is a 26 y.o. female with history of depression, anxiety, asthma, previous stroke who presents to the emergency department with complaints of right ear pain that started today.  She has had cough and congestion as well.  No known fevers.  No vomiting or diarrhea.  States it hurts to lie on that side. ? ? ?History provided by patient. ? ? ? ?Past Medical History:  ?Diagnosis Date  ? Anxiety   ? Asthma   ? no treatments required at this time  ? Depression   ? Migraine headache   ? 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 Stroke [facility](HCC)   ? ? ?Past Surgical History:  ?Procedure Laterality Date  ? BUBBLE STUDY  10/17/2020  ? Procedure: BUBBLE STUDY;  Surgeon: Jake BatheSkains, Mark C, MD;  [facility]: [facility] ENDOS[facility];  Service: Cardiovascular;;  ? IR ANGIO INTRA EXTRACRAN SEL COM CAROTID INNOMINATE UNI L MOD SED  10/11/2020  ? IR ANGIO VERTEBRAL SEL VERTEBRAL UNI L MOD SED  10/11/2020  ? IR CT HEAD LTD  10/11/2020  ? IR CT HEAD LTD  10/12/2020  ? IR PERCUTANEOUS ART THROMBECTOMY/INFUSION INTRACRANIAL INC DIAG ANGIO  10/11/2020  ? IR PERCUTANEOUS ART THROMBECTOMY/INFUSION INTRACRANIAL INC DIAG ANGIO  10/12/2020  ? NO PAST SURGERIES    ? PATENT FORAMEN OVALE(PFO) CLOSURE N/A 12/10/2020  ? Procedure: PATENT FORAMEN OVALE (PFO) CLOSURE;  Surgeon: Yates DecampGanji, Jay, MD;  [facility]: [facility] INV[facility];  Service: Cardiovascular;  Laterality: N/A;  ? RADIOLOGY WITH ANESTHESIA N/A 10/12/2020  ? Procedure: RADIOLOGY WITH ANESTHESIA;  Surgeon: Radiologist, Medication, MD;  Location: [facility] OR;  Service: Radiology;  Laterality: N/A;  ? RADIOLOGY WITH ANESTHESIA N/A 10/11/2020  ? Procedure: IR WITH ANESTHESIA;  Surgeon: Radiologist, Medication, MD;  Location: [facility] OR;  Service: Radiology;  Laterality: N/A;  ? TEE WITHOUT  CARDIOVERSION N/A 10/17/2020  ? Procedure: TRANSESOPHAGEAL ECHOCARDIOGRAM (TEE);  Surgeon: Jake BatheSkains, Mark C, MD;  [facility];  Service: Cardiovascular;  Laterality: N/A;  ? ? ?MEDICATIONS:  ?Prior to Admission medications   ?Medication Sig Start Date End Date Taking? Authorizing Provider  ?aspirin EC 81 MG tablet Take 1 tablet (81 mg total) by mouth daily. 04/21/21   Ihor AustinMcCue, Jessica, NP  ?atorvastatin (LIPITOR) 80 MG tablet Take 1 tablet (80 mg total) by mouth daily. 12/03/20   Ihor AustinMcCue, Jessica, NP  ?hydrOXYzine (VISTARIL) 50 MG capsule Take 1 capsule (50 mg total) by mouth 2 (two) times daily as needed. 03/24/21   Jomarie LongsEappen, Saramma, MD  ?mirtazapine (REMERON) 7.5 MG tablet Take 1 tablet (7.5 mg total) by mouth at bedtime. 04/21/21   Jomarie LongsEappen, Saramma, MD  ?venlafaxine XR (EFFEXOR XR) 37.5 MG 24 hr capsule Take 1 capsule (37.5 mg total) by mouth daily with breakfast. 05/26/21   Larae GroomsHoldsworth, Karen, NP  ?Vitamin D, Ergocalciferol, (DRISDOL) 1.25 MG (50000 UNIT) CAPS capsule Take 1 capsule (50,000 Units total) by mouth every 7 (seven) days. 01/14/21   Gerre ScullMcElwee, Lauren A, NP  ? ? ?Physical Exam  ? ?Triage Vital Signs: ?ED Triage Vitals  ?Enc Vitals Group  ?   BP 06/16/21 0001 126/84  ?   Pulse Rate 06/16/21 0001 74  ?   Resp 06/16/21 0001 18  ?   Temp  06/16/21 0001 98.4 ?F (36.9 ?C)  ?   Temp Source 06/16/21 0001 Oral  ?   SpO2 06/16/21 0001 100 %  ?   Weight 06/16/21 0039 195 lb (88.5 kg)  ?   Height 06/16/21 0039 5\' 9"  (1.753 m)  ?   Head Circumference --   ?   Peak Flow --   ?   Pain Score 06/16/21 0039 8  ?   Pain Loc --   ?   Pain Edu? --   ?   Excl. in GC? --   ? ? ?Most recent vital signs: ?Vitals:  ? 06/16/21 0001 06/16/21 0550  ?BP: 126/84 119/79  ?Pulse: 74 79  ?Resp: 18 20  ?Temp: 98.4 ?F (36.9 ?C)   ?SpO2: 100% 96%  ? ? ?CONSTITUTIONAL: Alert and oriented and responds appropriately to questions. Well-appearing; well-nourished ?HEAD: Normocephalic, atraumatic ?EYES: Conjunctivae clear, pupils appear equal, sclera  nonicteric ?ENT: normal nose; moist mucous membranes; No pharyngeal erythema or petechiae, no tonsillar hypertrophy or exudate, no uvular deviation, no unilateral swelling in posterior oropharynx, no trismus or drooling, no muffled voice, normal phonation, no stridor, airway patent.  Left TM is clear without erythema, purulence, bulging, perforation, effusion.  Right TM is erythematous and bulging with purulent effusion.  There is no drainage noted.  No perforation.  No cerumen impaction or sign of foreign body in the external auditory canal. No inflammation, erythema or drainage from the external auditory canal. No signs of mastoiditis. No pain with manipulation of the pinna bilaterally. ?NECK: Supple, normal ROM, no meningismus ?CARD: RRR; S1 and S2 appreciated; no murmurs, no clicks, no rubs, no gallops ?RESP: Normal chest excursion without splinting or tachypnea; breath sounds clear and equal bilaterally; no wheezes, no rhonchi, no rales, no hypoxia or respiratory distress, speaking full sentences ?ABD/GI: Normal bowel sounds; non-distended; soft, non-tender, no rebound, no guarding, no peritoneal signs ?BACK: The back appears normal ?EXT: Normal ROM in all joints; no deformity noted, no edema; no cyanosis ?SKIN: Normal color for age and race; warm; no rash on exposed skin ?NEURO: Moves all extremities equally, normal speech ?PSYCH: The patient's mood and manner are appropriate. ? ? ?ED Results / Procedures / Treatments  ? ?LABS: ?(all labs ordered are listed, but only abnormal results are displayed) ?Labs Reviewed - No data to display ? ? ?EKG: ? ?RADIOLOGY: ?My personal review and interpretation of imaging:   ? ?I have personally reviewed all radiology reports.   ?No results found. ? ? ?PROCEDURES: ? ?Critical Care performed: No ? ? ? ? ?Procedures ? ? ? ?IMPRESSION / MDM / ASSESSMENT AND PLAN / ED COURSE  ?I reviewed the triage vital signs and the nursing notes. ? ? ? ?Patient here with right-sided ear pain.   No fever. ? ? ? ? ?DIFFERENTIAL DIAGNOSIS (includes but not limited to):   Otitis media, otitis externa, cerumen impaction, mastoiditis, viral URI ? ? ?PLAN: Patient here with right-sided ear pain.  She does have a right otitis media.  Will discharge on amoxicillin.  Recommend alternating Tylenol and Motrin.  No sign of otitis externa, mastoiditis, cerumen impaction on exam.  Otherwise well-appearing and nontoxic.  No signs of pharyngitis.  Lungs clear to auscultation.  No hypoxia, increased work of breathing.   ? ? ?MEDICATIONS GIVEN IN ED: ?Medications  ?amoxicillin (AMOXIL) capsule 1,000 mg (1,000 mg Oral Given 06/16/21 0549)  ?ibuprofen (ADVIL) tablet 800 mg (800 mg Oral Given 06/16/21 0549)  ? ? ? ?ED COURSE:  At this time, I do not feel there is any life-threatening condition present. I reviewed all nursing notes, vitals, pertinent previous records.  All lab and urine results, EKGs, imaging ordered have been independently reviewed and interpreted by myself.  I reviewed all available radiology reports from any imaging ordered this visit.  Based on my assessment, I feel the patient is safe to be discharged home without further emergent workup and can continue workup as an outpatient as needed. Discussed all findings, treatment plan as well as usual and customary return precautions with patient.  They verbalize understanding and are comfortable with this plan.  Outpatient follow-up has been provided as needed.  All questions have been answered. ? ? ? ?CONSULTS: No admission required at this time as patient is here for otitis media.  Does not appear septic, toxic. ? ? ?OUTSIDE RECORDS REVIEWED: Reviewed patient's last office visit with Glenna Fellows on 04/29/2020. ? ? ? ? ? ? ? ? ?FINAL CLINICAL IMPRESSION(S) / ED DIAGNOSES  ? ?Final diagnoses:  ?Acute otitis media with effusion of right ear  ? ? ? ?Rx / DC Orders  ? ?ED Discharge Orders   ? ?      Ordered  ?  amoxicillin (AMOXIL) 875 MG tablet  2 times daily       ?  06/16/21 0543  ? ?  ?  ? ?  ? ? ? ?Note:  This document was prepared using Dragon voice recognition software and may include unintentional dictation errors. ?  ?Brendaliz Kuk, Layla Maw, DO ?06/16/21 0555 ? ?

## 2021-06-16 NOTE — Discharge Instructions (Addendum)
You may alternate Tylenol 1000 mg every 6 hours as needed for pain, fever and Ibuprofen 800 mg every 6-8 hours as needed for pain, fever.  Please take Ibuprofen with food.  Do not take more than 4000 mg of Tylenol (acetaminophen) in a 24 hour period. ° °

## 2021-06-16 NOTE — ED Notes (Signed)
Pt noted to be sleeping in the lobby, verified name and birthday and is in fact patient. Pt returned to board at this time.  ?

## 2021-06-16 NOTE — ED Notes (Signed)
Pt called multiple times to be taken to triage room to be seen by provider, no answer. Attempted to call patient's cell phone, no answer. Pt not visualized in lobby or outside.  ?

## 2021-06-25 ENCOUNTER — Ambulatory Visit: Payer: Managed Care, Other (non HMO) | Admitting: Nurse Practitioner

## 2021-06-25 NOTE — Progress Notes (Unsigned)
Established Patient Office Visit  Subjective:  Patient ID: Kristen Grant, female    DOB: 12-20-1995  Age: 26 y.o. MRN: [id number]  CC:  No chief complaint on file.   HPI [name] presents for follow up on depression and anxiety.   ANXIETY/STRESS Patient states she is not taking any of her medication.  States she just hasn't been on track.  She just hasn't been diligent about taking them.  Patient states she is still seeing Dr. [name].  She missed her most recent appointment for therapy.  Her next appointment with Dr. [name] is in June.  She denies concerns at visit today.        05/26/2021    2:03 PM 04/21/2021    2:59 PM 04/18/2021    8:44 AM 03/27/2021    2:10 PM 03/24/2021    5:17 PM  Depression screen PHQ 2/9  Decreased Interest '1  1 3   ' Down, Depressed, Hopeless '1  1 3   ' PHQ - 2 Score '2  2 6   ' Altered sleeping 3   3   Tired, decreased energy 3   3   Change in appetite 3   3   Feeling bad or failure about yourself  1   1   Trouble concentrating 2   2   Moving slowly or fidgety/restless 2   1   Suicidal thoughts 1   0   PHQ-9 Score 17   19   Difficult doing work/chores Somewhat difficult   Very difficult      Information is confidential and restricted. Go to Review Flowsheets to unlock data.       05/26/2021    2:03 PM 03/27/2021    2:10 PM 03/24/2021    5:18 PM 02/24/2021   11:01 AM  GAD 7 : Generalized Anxiety Score  Nervous, Anxious, on Edge '3 3  3  ' Control/stop worrying '3 3  3  ' Worry too much - different things '3 3  3  ' Trouble relaxing '3 3  3  ' Restless '2 3  2  ' Easily annoyed or irritable '3 3  3  ' Afraid - awful might happen '1 2  1  ' Total GAD 7 Score '18 20  18  ' Anxiety Difficulty Somewhat difficult Very difficult  Extremely difficult     Information is confidential and restricted. Go to Review Flowsheets to unlock data.     Past Medical History:  Diagnosis Date  . Anxiety   . Asthma    no treatments required at this time  . Depression   . Migraine  headache   . Nicotine vapor product user 10/06/2019  . Right middle cerebral artery stroke ([place]) 10/17/2020  . Stroke [facility])     Past Surgical History:  Procedure Laterality Date  . BUBBLE STUDY  10/17/2020   Procedure: BUBBLE STUDY;  Surgeon: Jerline Pain, MD;  [facility];  Service: Cardiovascular;;  . IR ANGIO INTRA EXTRACRAN SEL COM CAROTID INNOMINATE UNI L MOD SED  10/11/2020  . IR ANGIO VERTEBRAL SEL VERTEBRAL UNI L MOD SED  10/11/2020  . IR CT HEAD LTD  10/11/2020  . IR CT HEAD LTD  10/12/2020  . IR PERCUTANEOUS ART THROMBECTOMY/INFUSION INTRACRANIAL INC DIAG ANGIO  10/11/2020  . IR PERCUTANEOUS ART THROMBECTOMY/INFUSION INTRACRANIAL INC DIAG ANGIO  10/12/2020  . NO PAST SURGERIES    . PATENT FORAMEN OVALE(PFO) CLOSURE N/A 12/10/2020   Procedure: PATENT FORAMEN OVALE (PFO) CLOSURE;  Surgeon: Adrian Prows, MD;  Location: [place] CV LAB;  Service: Cardiovascular;  Laterality: N/A;  . RADIOLOGY WITH ANESTHESIA N/A 10/12/2020   Procedure: RADIOLOGY WITH ANESTHESIA;  Surgeon: Radiologist, Medication, MD;  Location: [place];  Service: Radiology;  Laterality: N/A;  . RADIOLOGY WITH ANESTHESIA N/A 10/11/2020   Procedure: IR WITH ANESTHESIA;  Surgeon: Radiologist, Medication, MD;  Location: [place];  Service: Radiology;  Laterality: N/A;  . TEE WITHOUT CARDIOVERSION N/A 10/17/2020   Procedure: TRANSESOPHAGEAL ECHOCARDIOGRAM (TEE);  Surgeon: Jerline Pain, MD;  Location: [facility] ENDOSCOPY;  Service: Cardiovascular;  Laterality: N/A;    Family History  Problem Relation Age of Onset  . Hypertension Mother   . Epilepsy Mother   . Anxiety disorder Mother   . Depression Mother   . Migraines Mother   . Diabetes Maternal Grandmother   . Stroke Maternal Grandmother   . Hypertension Maternal Grandmother   . Asthma Half-Brother   . Asthma Half-Sister   . Breast cancer Neg Hx     Social History   Socioeconomic History  . Marital status: Single    Spouse name: Not on file  . Number of children: 1   . Years of education: Not on file  . Highest education level: GED or equivalent  Occupational History  . Not on file  Tobacco Use  . Smoking status: Former    Types: E-cigarettes    Start date: 2021  . Smokeless tobacco: Never  Vaping Use  . Vaping Use: Every day  . Substances: Nicotine, THC  Substance and Sexual Activity  . Alcohol use: Yes    Comment: socially  . Drug use: Yes    Frequency: 3.0 times per week    Types: Marijuana    Comment: daily  . Sexual activity: Yes    Partners: Male    Birth control/protection: Condom  Other Topics Concern  . Not on file  Social History Narrative  . Not on file   Social Determinants of Health   Financial Resource Strain: Not on file  Food Insecurity: Not on file  Transportation Needs: Not on file  Physical Activity: Not on file  Stress: Not on file  Social Connections: Not on file  Intimate Partner Violence: Not At Risk  . Fear of Current or Ex-Partner: No  . Emotionally Abused: No  . Physically Abused: No  . Sexually Abused: No    Outpatient Medications Prior to Visit  Medication Sig Dispense Refill  . aspirin EC 81 MG tablet Take 1 tablet (81 mg total) by mouth daily. 90 tablet 3  . atorvastatin (LIPITOR) 80 MG tablet Take 1 tablet (80 mg total) by mouth daily. 90 tablet 3  . hydrOXYzine (VISTARIL) 50 MG capsule Take 1 capsule (50 mg total) by mouth 2 (two) times daily as needed. 30 capsule 1  . mirtazapine (REMERON) 7.5 MG tablet Take 1 tablet (7.5 mg total) by mouth at bedtime. 30 tablet 1  . venlafaxine XR (EFFEXOR XR) 37.5 MG 24 hr capsule Take 1 capsule (37.5 mg total) by mouth daily with breakfast. 30 capsule 0  . Vitamin D, Ergocalciferol, (DRISDOL) 1.25 MG (50000 UNIT) CAPS capsule Take 1 capsule (50,000 Units total) by mouth every 7 (seven) days. 12 capsule 0   No facility-administered medications prior to visit.    No Known Allergies  ROS Review of Systems  Respiratory: Negative.    Cardiovascular:  Negative.   Gastrointestinal: Negative.   Genitourinary: Negative.   Musculoskeletal: Negative.   Skin: Negative.   Neurological: Negative.  Psychiatric/Behavioral:  Positive for dysphoric mood. Negative for suicidal ideas. The patient is nervous/anxious.      Objective:    Physical Exam Vitals and nursing note reviewed.  Constitutional:      General: She is not in acute distress.    Appearance: Normal appearance.  HENT:     Head: Normocephalic and atraumatic.  Eyes:     Conjunctiva/sclera: Conjunctivae normal.  Cardiovascular:     Rate and Rhythm: Normal rate and regular rhythm.     Pulses: Normal pulses.     Heart sounds: Normal heart sounds.  Pulmonary:     Effort: Pulmonary effort is normal.     Breath sounds: Normal breath sounds.  Musculoskeletal:     Cervical back: Normal range of motion.  Skin:    General: Skin is warm and dry.  Neurological:     General: No focal deficit present.     Mental Status: She is alert and oriented to person, place, and time.  Psychiatric:        Mood and Affect: Mood normal.        Behavior: Behavior normal.        Thought Content: Thought content normal.        Judgment: Judgment normal.    There were no vitals taken for this visit. Wt Readings from Last 3 Encounters:  06/16/21 195 lb (88.5 kg)  06/06/21 196 lb 4.8 oz (89 kg)  05/26/21 198 lb 9.6 oz (90.1 kg)     Health Maintenance Due  Topic Date Due  . COVID-19 Vaccine (1) Never done    There are no preventive care reminders to display for this patient.   Lab Results  Component Value Date   TSH 0.821 01/13/2021   Lab Results  Component Value Date   WBC 8.2 12/06/2020   HGB 13.0 12/06/2020   HCT 37.6 12/06/2020   MCV 84 12/06/2020   PLT 307 12/06/2020   Lab Results  Component Value Date   NA 139 12/06/2020   K 3.5 12/06/2020   CO2 22 12/06/2020   GLUCOSE 110 (H) 12/06/2020   BUN 6 12/06/2020   CREATININE 0.60 12/06/2020   BILITOT 2.2 (H) 11/13/2020    ALKPHOS 70 11/13/2020   AST 33 11/13/2020   ALT 21 11/13/2020   PROT 7.0 11/13/2020   ALBUMIN 3.8 11/13/2020   CALCIUM 9.3 12/06/2020   ANIONGAP 10 11/13/2020   EGFR 128 12/06/2020   Lab Results  Component Value Date   CHOL 109 12/03/2020   Lab Results  Component Value Date   HDL 36 (L) 12/03/2020   Lab Results  Component Value Date   LDLCALC 53 12/03/2020   Lab Results  Component Value Date   TRIG 110 12/03/2020   Lab Results  Component Value Date   CHOLHDL 3.0 12/03/2020   Lab Results  Component Value Date   HGBA1C 5.0 10/12/2020      Assessment & Plan:   Problem List Items Addressed This Visit       Other   GAD (generalized anxiety disorder) - Primary   Depression, major, single episode, moderate ([place])    No orders of the defined types were placed in this encounter.    Follow-up: No follow-ups on file.    Jon Billings, NP

## 2021-07-22 ENCOUNTER — Ambulatory Visit: Payer: 59 | Admitting: Psychiatry

## 2021-08-20 ENCOUNTER — Other Ambulatory Visit: Payer: Self-pay | Admitting: Nurse Practitioner

## 2021-08-21 NOTE — Telephone Encounter (Signed)
Patient is overdue for appointment. Was due in May for a 1 month f/up. Please call to schedule and then route to provider for refill.

## 2021-08-21 NOTE — Telephone Encounter (Signed)
Requested medication (s) are due for refill today: no  Requested medication (s) are on the active medication list: no  Last refill:  05/26/21  Future visit scheduled: no  Notes to clinic:  Unable to refill per protocol, Rx expired. Rx was discontinued  on 05/26/21 by PCP, do to dosage change.      Requested Prescriptions  Pending Prescriptions Disp Refills   venlafaxine XR (EFFEXOR-XR) 75 MG 24 hr capsule [Pharmacy Med Name: VENLAFAXINE HCL ER 75 MG CAP] 90 capsule 1    Sig: TAKE ONE CAPSULE BY MOUTH DAILY WITH BREAKFAST     Psychiatry: Antidepressants - SNRI - desvenlafaxine & venlafaxine Failed - 08/20/2021  6:23 AM      Failed - Lipid Panel in normal range within the last 12 months    Cholesterol, Total  Date Value Ref Range Status  12/03/2020 109 100 - 199 mg/dL Final   LDL Chol Calc (NIH)  Date Value Ref Range Status  12/03/2020 53 0 - 99 mg/dL Final   HDL  Date Value Ref Range Status  12/03/2020 36 (L) >39 mg/dL Final   Triglycerides  Date Value Ref Range Status  12/03/2020 110 0 - 149 mg/dL Final         Passed - Cr in normal range and within 360 days    Creatinine  Date Value Ref Range Status  03/19/2013 0.57 (L) 0.60 - 1.30 mg/dL Final   Creatinine, Ser  Date Value Ref Range Status  12/06/2020 0.60 0.57 - 1.00 mg/dL Final         Passed - Completed PHQ-2 or PHQ-9 in the last 360 days      Passed - Last BP in normal range    BP Readings from Last 1 Encounters:  06/16/21 119/79         Passed - Valid encounter within last 6 months    Recent Outpatient Visits           2 months ago Depression, major, single episode, moderate (HCC)   [facility] [place], Clydie Braun, NP   4 months ago Depression, major, single episode, moderate (HCC)   [facility] Larae Grooms, NP   5 months ago Neck pain   [facility] [place], Clydie Braun, NP   6 months ago Depression, major, single episode, moderate (HCC)   [facility] [place], Clydie Braun, NP   7 months ago Generalized anxiety disorder with panic attacks   [facility] McEl[place], Jake Church, NP       Future Appointments             In 4 months Yates Decamp, MD [facility]

## 2021-08-21 NOTE — Telephone Encounter (Signed)
LVM asking patient to call back to schedule an appointment 

## 2021-08-26 ENCOUNTER — Ambulatory Visit: Payer: Self-pay | Admitting: Advanced Practice Midwife

## 2021-08-26 ENCOUNTER — Encounter: Payer: Self-pay | Admitting: Advanced Practice Midwife

## 2021-08-26 DIAGNOSIS — N760 Acute vaginitis: Secondary | ICD-10-CM

## 2021-08-26 DIAGNOSIS — Z113 Encounter for screening for infections with a predominantly sexual mode of transmission: Secondary | ICD-10-CM

## 2021-08-26 DIAGNOSIS — R87612 Low grade squamous intraepithelial lesion on cytologic smear of cervix (LGSIL): Secondary | ICD-10-CM

## 2021-08-26 DIAGNOSIS — B9689 Other specified bacterial agents as the cause of diseases classified elsewhere: Secondary | ICD-10-CM

## 2021-08-26 LAB — HM HIV SCREENING LAB: HM HIV Screening: NEGATIVE

## 2021-08-26 LAB — HM HEPATITIS C SCREENING LAB: HM Hepatitis Screen: NEGATIVE

## 2021-08-26 LAB — WET PREP FOR TRICH, YEAST, CLUE
Trichomonas Exam: NEGATIVE
Yeast Exam: NEGATIVE

## 2021-08-26 MED ORDER — METRONIDAZOLE 500 MG PO TABS: 500.0000 mg | ORAL_TABLET | Freq: Two times a day (BID) | ORAL | 0 refills | Status: AC

## 2021-08-26 NOTE — Progress Notes (Signed)
Pt here for STD screening.  Wet mount results reviewed.  The patient was dispensed Metronidazole 500 mg #14 today. I provided counseling today regarding the medication. We discussed the medication, the side effects and when to call clinic. Patient given the opportunity to ask questions. Questions answered. Condoms declined.  Mohamud Mrozek M Karen Kinnard, RN   

## 2021-08-26 NOTE — Progress Notes (Signed)
[facility] Department  STI clinic/screening visit [place] [address] [phone]  Subjective:  Kristen Grant is a 26 y.o. SWF G1P1 (26 yo son) smoker female being seen today for an STI screening visit. The patient reports they do have symptoms.  Patient reports that they  unsure if  desire a pregnancy in the next year.   They reported they are not interested in discussing contraception today.    Patient's last menstrual period was 08/02/2021 (approximate).   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[place]) 03/24/2021   History of opioid abuse ([place]) 03/24/2021   History of stroke x2 10/2020 12/16/2020   Depression, major, single episode, moderate ([place]) 12/16/2020   PFO (patent foramen ovale) 12/09/2020   Anemia    Hemiparesis affecting left side as late effect of stroke (HCC)    Leukocytosis    Dyslipidemia    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  LMP 08/02/21. Last sex 08/20/21 with condom; first time with this partner. Last cig >10 years ago. Last vaped today. Last cigar 2 days ago. Last ETOH 07/2021 doesn't remember what she drank. MJ daily.   Last  HIV test per patient/review of record was 04/18/21 Patient reports last pap was 04/18/21 ASCUS HPV+ Colpo 06/06/21 ECC=wnl  Screening for MPX risk: Does the patient have an unexplained rash? No Is the patient MSM? No Does the patient endorse multiple sex partners or anonymous sex partners? Yes Did the patient have close or sexual contact with a person diagnosed with MPX? No Has the patient traveled outside the [place] where MPX is endemic? No Is there a high clinical suspicion for MPX-- evidenced by one of the following No  -Unlikely to be chickenpox  -Lymphadenopathy  -Rash that present in same phase of evolution on any given body part See flowsheet for further details and programmatic requirements.   Immunization history:  Immunization History  Administered Date(s) Administered   HPV 9-valent 01/13/2021   Hepatitis A 10/08/2006, 12/03/2009   Hepatitis B 24-Feb-1995, 08/18/1995, 03/22/1996   Hpv-Unspecified 12/03/2009, 04/15/2010, 11/06/2010   MMR 07/07/1996, 07/28/2000   Meningococcal B, Unspecified 10/08/2006   Meningococcal Conjugate 06/29/2012   Pneumococcal Conjugate PCV 7 07/29/1999   Tdap 07/22/2015   Varicella 07/07/1996, 12/03/2009, 08/26/2015     The following portions of the patient's history were reviewed and updated as appropriate: allergies, current medications, past medical history, past social history, past surgical history and problem list.  Objective:  There were no vitals filed for this visit.  Physical Exam Vitals and nursing note reviewed.  Constitutional:      Appearance: Normal appearance.  HENT:     Head: Normocephalic and atraumatic.  Mouth/Throat:     Mouth: Mucous membranes are moist.     Pharynx: Oropharynx is clear. No oropharyngeal exudate or posterior oropharyngeal erythema.  Eyes:     Conjunctiva/sclera: Conjunctivae normal.  Pulmonary:     Effort: Pulmonary effort is normal.  Abdominal:     Palpations: Abdomen is soft. There is no mass.      Tenderness: There is no abdominal tenderness. There is no rebound.     Comments: Soft without masses or tenderness, fair tone  Genitourinary:    General: Normal vulva.     Exam position: Lithotomy position.     Pubic Area: No rash or pubic lice.      Labia:        Right: No rash or lesion.        Left: No rash or lesion.      Vagina: Vaginal discharge (small amt white creamy leukorrhea, ph<4.5) present. No erythema, bleeding or lesions.     Cervix: Normal.     Uterus: Normal.      Adnexa: Right adnexa normal and left adnexa normal.     Rectum: Normal.     Comments: pH = <4.5 Lymphadenopathy:     Head:     Right side of head: No preauricular or posterior auricular adenopathy.     Left side of head: No preauricular or posterior auricular adenopathy.     Cervical: No cervical adenopathy.     Right cervical: No superficial, deep or posterior cervical adenopathy.    Left cervical: No superficial, deep or posterior cervical adenopathy.     Upper Body:     Right upper body: No supraclavicular, axillary or epitrochlear adenopathy.     Left upper body: No supraclavicular, axillary or epitrochlear adenopathy.     Lower Body: No right inguinal adenopathy. No left inguinal adenopathy.  Skin:    General: Skin is warm and dry.     Findings: No rash.  Neurological:     Mental Status: She is alert and oriented to person, place, and time.     Assessment and Plan:  Kristen Grant is a 26 y.o. female presenting to the [facility] Department for STI screening  1. Screening examination for venereal disease Treat wet mount per standing orders Immunization nurse consult - WET PREP FOR [place], YEAST, CLUE - Chlamydia/Gonorrhea [place] [facility] - Syphilis Serology, [place] [facility] - HIV/HCV [place] [facility] - Gonococcus culture  2. LGSIL on Pap smear of cervix 04/18/21 ASCUS HPV + Colpo 06/06/21 ECC=wnl Needs repeat pap in 1 year     No follow-ups on file.  Future Appointments  Date Time  Provider [place]  12/23/2021 10:00 AM PCV-ECHO/VAS 1 PCV-IMG None  01/07/2022 10:00 AM Adrian Prows, MD PCV-PCV None    Herbie Saxon, CNM

## 2021-08-28 NOTE — Telephone Encounter (Signed)
2nd attempt to reach patient.

## 2021-08-30 LAB — GONOCOCCUS CULTURE

## 2021-09-01 ENCOUNTER — Encounter: Payer: Self-pay | Admitting: Nurse Practitioner

## 2021-09-01 NOTE — Telephone Encounter (Signed)
3rd attempt to reach patient.

## 2021-09-02 ENCOUNTER — Telehealth: Payer: Self-pay

## 2021-09-02 ENCOUNTER — Ambulatory Visit: Payer: Managed Care, Other (non HMO)

## 2021-09-02 DIAGNOSIS — A749 Chlamydial infection, unspecified: Secondary | ICD-10-CM

## 2021-09-02 MED ORDER — DOXYCYCLINE HYCLATE 100 MG PO TABS: 100.0000 mg | ORAL_TABLET | Freq: Two times a day (BID) | ORAL | 0 refills | Status: AC

## 2021-09-02 NOTE — Progress Notes (Signed)
In nurse clinic for chlamydia treatment. NKDA. On no bcm. LMP 08/27/2021(normal) . Per pt, last sex 08/20/21 (approx).   Treated per SO Dr Karyl Kinnier with doxycycline 100 mg #14.  The patient was dispensed doxycycline 100 mg #14 today and instructions reviewed. I provided counseling today regarding the medication. We discussed the medication, the side effects and when to call clinic. Patient given the opportunity to ask questions. Questions answered.  Kristen Shepherd, RN

## 2021-09-02 NOTE — Telephone Encounter (Signed)
Pt returned call and confirmed password. Counseled pt re + CT result. Pt states NKA, just finished a normal period yesterday and no sex since starting period, not using BC.  Tx appt scheduled for 09/02/21.

## 2021-09-02 NOTE — Telephone Encounter (Signed)
Calling pt re positive chlamydia result from 08/26/21 vaginal specimen.  Pt needs tx appt.  Phone call to [phone]. Left message on voicemail that RN with [facility] is calling re TR. Please call Wally Shevchenko at [phone].  Also sent MyChart message.

## 2021-11-24 ENCOUNTER — Ambulatory Visit: Payer: Managed Care, Other (non HMO)

## 2021-12-23 ENCOUNTER — Other Ambulatory Visit: Payer: Managed Care, Other (non HMO)

## 2022-01-06 ENCOUNTER — Ambulatory Visit: Payer: Managed Care, Other (non HMO)

## 2022-01-06 DIAGNOSIS — I749 Embolism and thrombosis of unspecified artery: Secondary | ICD-10-CM

## 2022-01-06 DIAGNOSIS — Z8774 Personal history of (corrected) congenital malformations of heart and circulatory system: Secondary | ICD-10-CM

## 2022-01-06 DIAGNOSIS — Q2112 Patent foramen ovale: Secondary | ICD-10-CM

## 2022-01-07 ENCOUNTER — Ambulatory Visit: Payer: Managed Care, Other (non HMO) | Admitting: Cardiology

## 2022-01-07 ENCOUNTER — Encounter: Payer: Self-pay | Admitting: Cardiology

## 2022-01-07 VITALS — BP 119/80 | HR 71 | Resp 16 | Ht 69.0 in | Wt 191.6 lb

## 2022-01-07 DIAGNOSIS — I749 Embolism and thrombosis of unspecified artery: Secondary | ICD-10-CM

## 2022-01-07 DIAGNOSIS — Z8774 Personal history of (corrected) congenital malformations of heart and circulatory system: Secondary | ICD-10-CM

## 2022-01-07 MED ORDER — ASPIRIN 81 MG PO CHEW: 81.0000 mg | CHEWABLE_TABLET | Freq: Every day | ORAL | Status: DC

## 2022-01-07 NOTE — Progress Notes (Signed)
Primary Physician/Referring:  Larae Grooms, NP  Patient ID: Kristen Grant, female    DOB: Jul 23, 1995, 26 y.o.   MRN: [id number]  Chief Complaint  Patient presents with   PFO repair   Results    Echocardiogram   Follow-up    1 year   HPI:    Kristen Grant  is a 26 y.o. Caucasian female patient with no significant prior cardiovascular history, patient was admitted to the hospital on 10/11/2020 with acute stroke, she had right distal M1 segment near complete occlusion for which she mechanical thrombectomy, but due to recurrence of strokelike symptoms, underwent repeat percutaneous thrombectomy on 10/21/2020.  She was also found to have a moderate to large sized PFO by transcranial bubble study and a PFO by TEE.  Underwent repair of the PFO on 12/10/2020  Since PFO closure, she has not had any recurrence of migraine headaches, she has discontinued Topamax.  Left upper extremity weakness is also significantly improved.  She remains asymptomatic otherwise.  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   Stroke [facility])    Past Surgical History:  Procedure Laterality Date   BUBBLE STUDY  10/17/2020   Procedure: BUBBLE STUDY;  Surgeon: Jake Bathe, MD;  Location: [facility] ENDOSCOPY;  Service: Cardiovascular;;   IR ANGIO INTRA EXTRACRAN SEL COM CAROTID INNOMINATE UNI L MOD SED  10/11/2020   IR ANGIO VERTEBRAL SEL VERTEBRAL UNI L MOD SED  10/11/2020   IR CT HEAD LTD  10/11/2020   IR CT HEAD LTD  10/12/2020   IR PERCUTANEOUS ART THROMBECTOMY/INFUSION INTRACRANIAL INC DIAG ANGIO  10/11/2020   IR PERCUTANEOUS ART THROMBECTOMY/INFUSION INTRACRANIAL INC DIAG ANGIO  10/12/2020   NO PAST SURGERIES     PATENT FORAMEN OVALE(PFO) CLOSURE N/A 12/10/2020   Procedure: PATENT FORAMEN OVALE (PFO) CLOSURE;  Surgeon: Yates Decamp, MD;   Location: [facility] INVASIVE CV LAB;  Service: Cardiovascular;  Laterality: N/A;   RADIOLOGY WITH ANESTHESIA N/A 10/12/2020   Procedure: RADIOLOGY WITH ANESTHESIA;  Surgeon: Radiologist, Medication, MD;  Location: [facility] OR;  Service: Radiology;  Laterality: N/A;   RADIOLOGY WITH ANESTHESIA N/A 10/11/2020   Procedure: IR WITH ANESTHESIA;  Surgeon: Radiologist, Medication, MD;  Location: [facility] OR;  Service: Radiology;  Laterality: N/A;   TEE WITHOUT CARDIOVERSION N/A 10/17/2020   Procedure: TRANSESOPHAGEAL ECHOCARDIOGRAM (TEE);  Surgeon: Jake Bathe, MD;  Location: [facility] ENDOSCOPY;  Service: Cardiovascular;  Laterality: N/A;   Family History  Problem Relation Age of Onset   Hypertension Mother    Epilepsy Mother    Anxiety disorder Mother    Depression Mother    Migraines Mother    Diabetes Maternal Grandmother    Stroke Maternal Grandmother    Hypertension Maternal Grandmother    Asthma Half-Brother    Asthma Half-Sister    Breast cancer Neg Hx     Social History   Tobacco Use   Smoking status: Some Days    Types: E-cigarettes, Cigars    Start date: 2021   Smokeless tobacco: Never  Substance Use Topics   Alcohol use: Not Currently    Alcohol/week: 2.0 standard drinks of alcohol    Types: 2 Cans of beer per week    Comment: last use 07/2021   Marital Status: Single  ROS  Review of Systems  Cardiovascular:  Negative for chest pain, dyspnea on exertion and leg swelling.  Gastrointestinal:  Negative for melena.  Neurological:  Positive for focal weakness (left arm).   Objective  Blood pressure 119/80, pulse 71, resp. rate 16, height 5\' 9"  (1.753 m), weight 191 lb 9.6 oz (86.9 kg). Body mass index is 28.29 kg/m.     01/07/2022    9:57 AM 06/16/2021    5:50 AM 06/16/2021   12:39 AM  Vitals with BMI  Height 5\' 9"   5\' 9"   Weight 191 lbs 10 oz  195 lbs  BMI 28.28  28.78  Systolic 119 119   Diastolic 80 79   Pulse 71 79      Physical Exam Neck:     Vascular: No carotid bruit or JVD.   Cardiovascular:     Rate and Rhythm: Normal rate and regular rhythm.     Pulses: Intact distal pulses.     Heart sounds: Normal heart sounds. No murmur heard.    No gallop.  Pulmonary:     Effort: Pulmonary effort is normal.     Breath sounds: Normal breath sounds.  Abdominal:     General: Bowel sounds are normal.     Palpations: Abdomen is soft.  Musculoskeletal:        General: No swelling.  Neurological:     Motor: Weakness (left arm) present.      Laboratory examination:       Latest Ref Rng & Units 12/06/2020    4:06 PM 11/13/2020    9:37 PM 10/18/2020    5:17 AM  CMP  Glucose 70 - 99 mg/dL 621110  308102  [id number]   BUN 6 - 20 mg/dL 6  10  15    Creatinine 0.57 - 1.00 mg/dL 8.460.60  9.621.20  9.520.59   Sodium 134 - 144 mmol/L 139  133  138   Potassium 3.5 - 5.2 mmol/L 3.5  3.0  3.5   Chloride 96 - 106 mmol/L 104  104  103   CO2 20 - 29 mmol/L 22  19  19    Calcium 8.7 - 10.2 mg/dL 9.3  9.1  9.6   Total Protein 6.5 - 8.1 g/dL  7.0  6.7   Total Bilirubin 0.3 - 1.2 mg/dL  2.2  1.9   Alkaline Phos 38 - 126 U/L  70  55   AST 15 - 41 U/L  33  21   ALT 0 - 44 U/L  21  20       Latest Ref Rng & Units 12/06/2020    4:06 PM 11/13/2020    9:37 PM 10/21/2020    8:05 AM  CBC  WBC 3.4 - 10.8 x10E3/uL 8.2  8.5  8.4   Hemoglobin 11.1 - 15.9 g/dL 84.113.0  32.414.0  40.111.6   Hematocrit 34.0 - 46.6 % 37.6  40.8  34.9   Platelets 150 - 450 x10E3/uL 307  248  328    Lipid Panel     Component Value Date/Time   CHOL 109 12/03/2020 0840   TRIG 110 12/03/2020 0840   HDL 36 (L) 12/03/2020 0840   CHOLHDL 3.0 12/03/2020 0840   CHOLHDL 4.0 10/12/2020 0018   VLDL 11 10/12/2020 0018   LDLCALC 53 12/03/2020 0840   LABVLDL 20 12/03/2020 0840     HEMOGLOBIN A1C Lab Results  Component Value Date   HGBA1C 5.0 10/12/2020   MPG 96.8 10/12/2020   TSH Recent Labs  01/13/21 1014  TSH 0.821    Medications and allergies  No Known Allergies    Medications:    None  Radiology:   PCV ECHOCARDIOGRAM  COMPLETE  Result Date: 01/07/2022 Echocardiogram 01/06/2022: Normal LV systolic function with EF 66%. Left ventricle cavity is normal in size. Normal left ventricular wall thickness. Normal global wall motion. Calculated EF 66%. Left atrial cavity is normal in size. There is a Amplatzer ASO device (25 mm Amplatzer PFO occluder )  noted in the fossa ovalis.  Appears to be in good position.  No thrombus.  There  was suggestion of possible residual shunting across the interatrial septum however double contrast study at rest and with Valsalva reveals no residual shunting. Compared to the study done on 12/10/2020, no significant change.    Cardiac Studies:   TEE 10/17/2020:  1. There are very few bubbles seen in cross over upon second bubble study performed. A small PFO may be present. There is also small color flow Doppler signal seen at PFO sight.  2. Left ventricular ejection fraction, by estimation, is 60 to 65%. The left ventricle has normal function.  3. Right ventricular systolic function is normal. The right ventricular size is normal.  4. No left atrial/left atrial appendage thrombus was detected.  5. The mitral valve is normal in structure. No evidence of mitral valve regurgitation.  6. The aortic valve is normal in structure. Aortic valve regurgitation is not visualized.  7. Minimal evidence of shunting.  Lower extremity venous duplex 10/14/2020: No evidence of DVT bilaterally.  Intracardiac echo guided closure of the patent foramen ovale on 12/10/2020 with implantation of a 25 mm Amplatzer [place] PFO occluder.   Recommendation: Aspirin indefinitely, Plavix 3 months, endocarditis prophylaxis 6 months.  Patient to be discharged home today.  Echocardiogram 01/06/2022:  Normal LV systolic function with EF 66%. Left ventricle cavity is normal in size. Normal left ventricular wall thickness. Normal global wall motion. Calculated EF 66%. Left atrial cavity is normal in size. There is a  Amplatzer ASO device (25 mm Amplatzer PFO occluder )  noted in the fossa ovalis.  Appears to be in good position.  No thrombus.  There  was suggestion of possible residual shunting across the interatrial septum however double contrast study at rest and with Valsalva reveals no residual shunting. Compared to the study done on 12/10/2020, no significant change.  EKG:   EKG 01/07/2022: Normal sinus rhythm with rate of 64 bpm, normal EKG.  Assessment     ICD-10-CM   1. History of repair of congenital atrial septal defect (ASD)  Z87.74 EKG 12-Lead    aspirin (ASPIRIN CHILDRENS) 81 MG chewable tablet    2. 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   Orders Placed This Encounter  Procedures   EKG 12-Lead  Recommendations:   Kristen Grant is a 26 y.o. Caucasian female patient with no significant prior cardiovascular history, patient was admitted to the hospital on 10/11/2020 with acute stroke, she had right distal M1 segment near complete occlusion for which she mechanical thrombectomy, but due to recurrence of strokelike symptoms, underwent repeat percutaneous thrombectomy on 10/21/2020.  She was also found to have a moderate to large sized PFO by transcranial bubble study and a PFO by TEE.  Underwent repair of the PFO on 12/10/2020  Since PFO closure, she has not had any recurrence of  migraine headaches, she has discontinued Topamax.  Overall she remains asymptomatic.  I reviewed the results of the echocardiogram which reveals excellent repair of the PFO without residual shunting.  She will continue aspirin indefinitely, I will see her back on a as needed basis.  She has discontinued all her medications including statins.  Her LDL is only minimally elevated.  She can continue primary prevention, she is also lost weight.  I congratulated her on this.  Advised her to restart aspirin.    Yates Decamp, MD, [facility] 01/07/2022, 11:58 AM Office: [phone]

## 2022-02-02 DIAGNOSIS — A549 Gonococcal infection, unspecified: Secondary | ICD-10-CM

## 2022-02-02 HISTORY — DX: Gonococcal infection, unspecified: A54.9

## 2022-02-13 ENCOUNTER — Ambulatory Visit: Payer: Self-pay | Admitting: Family

## 2022-02-13 ENCOUNTER — Encounter: Payer: Self-pay | Admitting: Family

## 2022-02-13 DIAGNOSIS — N760 Acute vaginitis: Secondary | ICD-10-CM

## 2022-02-13 DIAGNOSIS — Z113 Encounter for screening for infections with a predominantly sexual mode of transmission: Secondary | ICD-10-CM

## 2022-02-13 DIAGNOSIS — B9689 Other specified bacterial agents as the cause of diseases classified elsewhere: Secondary | ICD-10-CM

## 2022-02-13 LAB — WET PREP FOR TRICH, YEAST, CLUE
Trichomonas Exam: NEGATIVE
Yeast Exam: NEGATIVE

## 2022-02-13 LAB — HM HEPATITIS C SCREENING LAB: HM Hepatitis Screen: NEGATIVE

## 2022-02-13 LAB — HM HIV SCREENING LAB: HM HIV Screening: NEGATIVE

## 2022-02-13 MED ORDER — METRONIDAZOLE 500 MG PO TABS: 500.0000 mg | ORAL_TABLET | Freq: Two times a day (BID) | ORAL | 0 refills | Status: AC

## 2022-02-13 NOTE — Progress Notes (Signed)
[facility] Department  STI clinic/screening visit [place] [address] [phone]  Subjective:  JOZELYN KUWAHARA is a 27 y.o. female being seen today for an STI screening visit. The patient reports they do have symptoms.  Patient reports that they do not desire a pregnancy in the next year.   They reported they are not interested in discussing contraception today.    Patient's last menstrual period was 02/05/2022 (exact date).  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[place]) 03/24/2021   History of opioid abuse ([place]) 03/24/2021   History of stroke x2 10/2020 12/16/2020   Depression, major, single episode, moderate ([place]) 12/16/2020   PFO (patent foramen ovale) 12/09/2020   Anemia    Hemiparesis affecting left side as late effect of stroke ([place])    Leukocytosis    Dyslipidemia    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 Symptoms; Smell, increased discharge, blood and pain with sex    HPI  Patient reports vaginal itching and pain with sex, increased white discharge with odor, started one month ago. Has a new partner x 1 month. History of chlamydia in August 2023 with previous partner, symptoms feeling similar now.   Does the patient using douching products? No  Last  HIV test per patient/review of record was  Lab Results  Component Value Date   HMHIVSCREEN Negative - Validated 08/26/2021    Lab Results  Component Value Date   HIV Non Reactive 10/11/2020   Patient reports last pap was 04/18/2021- LGSIL with colposcopy on 06/06/2021 at [facility], needs repeat PAP in one year.  Screening for MPX risk: Does the patient have an unexplained rash? No Is the patient MSM? No Does the patient endorse multiple sex partners or anonymous sex partners? No Did the patient have close or sexual contact with a person diagnosed with MPX? No Has the patient traveled outside the [place] where MPX is endemic? No Is there a high clinical suspicion for MPX-- evidenced by one of the following No  -Unlikely to be chickenpox  -Lymphadenopathy  -Rash that present in same phase of evolution on any given body part See flowsheet for further details and programmatic requirements.   Immunization history:  Immunization History  Administered Date(s) Administered   HPV 9-valent 01/13/2021   Hepatitis A 10/08/2006, 12/03/2009   Hepatitis B 1995-07-29, 08/18/1995, 03/22/1996   Hpv-Unspecified 12/03/2009, 04/15/2010, 11/06/2010   MMR 07/07/1996, 07/28/2000   Meningococcal B, Unspecified 10/08/2006   Meningococcal Conjugate 06/29/2012   Pneumococcal Conjugate PCV 7 07/29/1999   Tdap 07/22/2015   Varicella 07/07/1996, 12/03/2009, 08/26/2015     The following portions of the patient's history were reviewed and updated as appropriate: allergies, current medications, past medical history, past social history, past surgical history and problem list.  Objective:  There were no vitals filed for this  visit.  Physical Exam Constitutional:      Appearance: Normal appearance.  HENT:     Mouth/Throat:     Mouth: Mucous membranes are moist.     Pharynx: Oropharynx is clear. No oropharyngeal exudate.  Genitourinary:    General: Normal vulva.     Vagina: Vaginal discharge  (moderate amount white malodorous) present.     Cervix: Normal.     Uterus: Normal.      Adnexa: Right adnexa normal and left adnexa normal.     Rectum: Normal.  Musculoskeletal:     Cervical back: Neck supple.  Lymphadenopathy:     Cervical: No cervical adenopathy.  Skin:    General: Skin is warm and dry.     Findings: No rash.  Neurological:     Mental Status: She is alert.      Assessment and Plan:  ALYVIAH CRANDLE is a 27 y.o. female presenting to the [facility] Department for STI screening  1. Screening for venereal disease  - Chlamydia/Gonorrhea [place] [facility] - Syphilis Serology, [place] [facility] - HIV/HCV [place] [facility] - WET PREP FOR [place], YEAST, CLUE  2. Bacterial vaginosis Metronidazole 500mg  po bid x 7 days ETOH precautions Discussed perineal hygiene Use condoms for all sex Advised patient's partner get screen as well  Patient accepted all screenings including vaginal CT/GC and bloodwork for HIV/RPR, and wet prep. Patient meets criteria for HepB screening? No. Ordered? no Patient meets criteria for HepC screening? No. Ordered? no  Treat wet prep per standing order Discussed time line for [facility] results and that patient will be called with positive results and encouraged patient to call if she had not heard in 2 weeks.  Counseled to return or seek care for continued or worsening symptoms Recommended condom use with all sex  Patient is currently using condoms to prevent pregnancy.    Return if symptoms worsen or fail to improve.  No future appointments.  Marline Backbone, FNP

## 2022-02-13 NOTE — Progress Notes (Signed)
Pt appointment for STI screening with symptoms. Seen by [place]. Initial lab results Pos for clue and [place] reviewed. Per verbal order treated pt for BV. Metronidazole dispensed and instructions given.

## 2022-02-20 ENCOUNTER — Telehealth: Payer: Self-pay

## 2022-02-20 NOTE — Telephone Encounter (Signed)
Calling pt re positive gonorrhea result from 02/13/22 vaginal specimen. Pt needs tx appt.  Phone call to pt at [phone]. Left message that RN with [facility] is calling re TR. Please call Sanika Brosious at [phone]. Tried twice. Also sent MyChart message.

## 2022-02-23 NOTE — Telephone Encounter (Signed)
Received return call from pt and pt confirmed password. Counseled pt re + GC. Pt states NKA.   Tx appt scheduled for 02/24/22. (3 RN schedule in PM)

## 2022-02-24 ENCOUNTER — Ambulatory Visit: Payer: Managed Care, Other (non HMO)

## 2022-02-24 DIAGNOSIS — A549 Gonococcal infection, unspecified: Secondary | ICD-10-CM

## 2022-02-24 MED ORDER — CEFTRIAXONE SODIUM 500 MG IJ SOLR
500.0000 mg | Freq: Once | INTRAMUSCULAR | Status: AC
  Administered 2022-02-24: 500 mg via INTRAMUSCULAR

## 2022-02-24 NOTE — Progress Notes (Signed)
In nurse clinic for gonorrhea treatment. NKA. Treated today per SO Dr Vertell Novak with Ceftriaxone 500 mg IM once. Tolerated injection well. Stayed for 15 min observation without problem. Josie Saunders, RN

## 2022-07-02 ENCOUNTER — Emergency Department (HOSPITAL_COMMUNITY)
Admission: EM | Admit: 2022-07-02 | Discharge: 2022-07-02 | Disposition: A | Discharge status: Home / Self Care | Payer: Managed Care, Other (non HMO) | Attending: Emergency Medicine | Admitting: Emergency Medicine

## 2022-07-02 ENCOUNTER — Emergency Department (HOSPITAL_COMMUNITY): Payer: Managed Care, Other (non HMO)

## 2022-07-02 ENCOUNTER — Encounter (HOSPITAL_COMMUNITY): Payer: Self-pay

## 2022-07-02 ENCOUNTER — Other Ambulatory Visit: Payer: Self-pay

## 2022-07-02 DIAGNOSIS — J45909 Unspecified asthma, uncomplicated: Secondary | ICD-10-CM | POA: Insufficient documentation

## 2022-07-02 DIAGNOSIS — Z7982 Long term (current) use of aspirin: Secondary | ICD-10-CM | POA: Insufficient documentation

## 2022-07-02 DIAGNOSIS — R519 Headache, unspecified: Secondary | ICD-10-CM | POA: Diagnosis present

## 2022-07-02 DIAGNOSIS — G4452 New daily persistent headache (NDPH): Secondary | ICD-10-CM | POA: Diagnosis not present

## 2022-07-02 DIAGNOSIS — J069 Acute upper respiratory infection, unspecified: Secondary | ICD-10-CM | POA: Diagnosis not present

## 2022-07-02 DIAGNOSIS — Z79899 Other long term (current) drug therapy: Secondary | ICD-10-CM | POA: Insufficient documentation

## 2022-07-02 DIAGNOSIS — R791 Abnormal coagulation profile: Secondary | ICD-10-CM | POA: Insufficient documentation

## 2022-07-02 DIAGNOSIS — J029 Acute pharyngitis, unspecified: Secondary | ICD-10-CM

## 2022-07-02 DIAGNOSIS — Z8669 Personal history of other diseases of the nervous system and sense organs: Secondary | ICD-10-CM | POA: Diagnosis not present

## 2022-07-02 DIAGNOSIS — Z20822 Contact with and (suspected) exposure to covid-19: Secondary | ICD-10-CM | POA: Insufficient documentation

## 2022-07-02 LAB — DIFFERENTIAL
Abs Immature Granulocytes: 0.01 10*3/uL (ref 0.00–0.07)
Basophils Absolute: 0.1 10*3/uL (ref 0.0–0.1)
Basophils Relative: 1 %
Eosinophils Absolute: 0.3 10*3/uL (ref 0.0–0.5)
Eosinophils Relative: 4 %
Immature Granulocytes: 0 %
Lymphocytes Relative: 36 %
Lymphs Abs: 2.5 10*3/uL (ref 0.7–4.0)
Monocytes Absolute: 0.8 10*3/uL (ref 0.1–1.0)
Monocytes Relative: 11 %
Neutro Abs: 3.4 10*3/uL (ref 1.7–7.7)
Neutrophils Relative %: 48 %

## 2022-07-02 LAB — RESP PANEL BY RT-PCR (RSV, FLU A&B, COVID)  RVPGX2
Influenza A by PCR: NEGATIVE
Influenza B by PCR: NEGATIVE
Resp Syncytial Virus by PCR: NEGATIVE
SARS Coronavirus 2 by RT PCR: NEGATIVE

## 2022-07-02 LAB — CBC
HCT: 38.7 % (ref 36.0–46.0)
Hemoglobin: 13.3 g/dL (ref 12.0–15.0)
MCH: 31.1 pg (ref 26.0–34.0)
MCHC: 34.4 g/dL (ref 30.0–36.0)
MCV: 90.4 fL (ref 80.0–100.0)
Platelets: 296 10*3/uL (ref 150–400)
RBC: 4.28 MIL/uL (ref 3.87–5.11)
RDW: 12.5 % (ref 11.5–15.5)
WBC: 7 10*3/uL (ref 4.0–10.5)
nRBC: 0 % (ref 0.0–0.2)

## 2022-07-02 LAB — URINALYSIS, ROUTINE W REFLEX MICROSCOPIC
Bilirubin Urine: NEGATIVE
Glucose, UA: NEGATIVE mg/dL
Hgb urine dipstick: NEGATIVE
Ketones, ur: NEGATIVE mg/dL
Leukocytes,Ua: NEGATIVE
Nitrite: NEGATIVE
Protein, ur: NEGATIVE mg/dL
Specific Gravity, Urine: 1.009 (ref 1.005–1.030)
pH: 6 (ref 5.0–8.0)

## 2022-07-02 LAB — COMPREHENSIVE METABOLIC PANEL
ALT: 22 U/L (ref 0–44)
AST: 25 U/L (ref 15–41)
Albumin: 4.2 g/dL (ref 3.5–5.0)
Alkaline Phosphatase: 60 U/L (ref 38–126)
Anion gap: 9 (ref 5–15)
BUN: 7 mg/dL (ref 6–20)
CO2: 26 mmol/L (ref 22–32)
Calcium: 9.2 mg/dL (ref 8.9–10.3)
Chloride: 103 mmol/L (ref 98–111)
Creatinine, Ser: 0.61 mg/dL (ref 0.44–1.00)
GFR, Estimated: >60 mL/min (ref >60)
Glucose, Bld: 120 mg/dL — ABNORMAL HIGH (ref 70–99)
Potassium: 4 mmol/L (ref 3.5–5.1)
Sodium: 138 mmol/L (ref 135–145)
Total Bilirubin: 1.3 mg/dL — ABNORMAL HIGH (ref 0.3–1.2)
Total Protein: 7 g/dL (ref 6.5–8.1)

## 2022-07-02 LAB — RAPID URINE DRUG SCREEN, HOSP PERFORMED
Amphetamines: NOT DETECTED
Barbiturates: NOT DETECTED
Benzodiazepines: NOT DETECTED
Cocaine: NOT DETECTED
Opiates: NOT DETECTED
Tetrahydrocannabinol: POSITIVE — AB

## 2022-07-02 LAB — I-STAT BETA HCG BLOOD, ED (MC, WL, AP ONLY): I-stat hCG, quantitative: <5 m[IU]/mL (ref <5)

## 2022-07-02 LAB — I-STAT CHEM 8, ED
BUN: 7 mg/dL (ref 6–20)
Calcium, Ion: 1.19 mmol/L (ref 1.15–1.40)
Chloride: 101 mmol/L (ref 98–111)
Creatinine, Ser: 0.5 mg/dL (ref 0.44–1.00)
Glucose, Bld: 123 mg/dL — ABNORMAL HIGH (ref 70–99)
HCT: 38 % (ref 36.0–46.0)
Hemoglobin: 12.9 g/dL (ref 12.0–15.0)
Potassium: 3.9 mmol/L (ref 3.5–5.1)
Sodium: 138 mmol/L (ref 135–145)
TCO2: 28 mmol/L (ref 22–32)

## 2022-07-02 LAB — PROTIME-INR
INR: 1 (ref 0.8–1.2)
Prothrombin Time: 13.4 seconds (ref 11.4–15.2)

## 2022-07-02 LAB — GROUP A STREP BY PCR: Group A Strep by PCR: NOT DETECTED

## 2022-07-02 LAB — APTT: aPTT: 29 seconds (ref 24–36)

## 2022-07-02 LAB — ETHANOL: Alcohol, Ethyl (B): <10 mg/dL (ref <10)

## 2022-07-02 NOTE — ED Triage Notes (Signed)
Pt arrives with c/o headache that started about a month ago. Pt has hx of CVA and is worried that she could be having another stroke. Pt also endorses sore throat, runny nose, and cough that started 1-2 days ago. Pt denies vision changes, focal weakness, slurred speech, and sensation deficit.

## 2022-07-02 NOTE — Discharge Instructions (Signed)
Your history, exam, and workup today did not reveal findings that would require admission at this time.  I suspect you have a viral upper restaurant infection causing the cough, congestion, and sore throat as you are negative for strep/COVID/flu/RSV.  The CT of your head did not show evidence of new stroke or bleeding but showed evidence of your old stroke.  Your labs were otherwise overall reassuring and we feel you are safe for outpatient follow-up and discharge.  As we discussed, you could be developing an increased headache syndrome in the setting of previous migraines and your previous stroke however we do feel you are safe for neurology follow-up.  Please rest and stay hydrated.  If any symptoms change or worsen acutely, please return to the nearest emergency department.

## 2022-07-02 NOTE — ED Provider Notes (Signed)
[place] EMERGENCY DEPARTMENT AT [facility] Provider Note   CSN: [id number] Arrival date & time: 07/02/22  1544     History  Chief Complaint  Patient presents with   Headache   Sore Throat    Kristen Grant is a 27 y.o. female.  The history is provided by the patient and medical records. No language interpreter was used.  Headache Pain location:  R temporal and L temporal Quality:  Dull Radiates to:  Does not radiate Severity currently:  0/10 Severity at highest:  Unable to specify Onset quality:  Gradual Duration:  5 weeks Timing:  Sporadic Progression:  Waxing and waning Chronicity:  Recurrent Similar to prior headaches: yes   Context: loud noise   Relieved by:  Nothing Worsened by:  Nothing Associated symptoms: congestion and cough   Associated symptoms: no abdominal pain, no back pain, no blurred vision, no diarrhea, no dizziness, no eye pain, no facial pain, no fatigue, no fever, no focal weakness, no loss of balance, no myalgias, no nausea, no near-syncope, no neck pain, no neck stiffness, no numbness, no photophobia, no visual change, no vomiting and no weakness   Sore Throat Associated symptoms include headaches. Pertinent negatives include no chest pain, no abdominal pain and no shortness of breath.       Home Medications Prior to Admission medications   Medication Sig Start Date End Date Taking? Authorizing Provider  aspirin (ASPIRIN CHILDRENS) 81 MG chewable tablet Chew 1 tablet (81 mg total) by mouth daily. Patient not taking: Reported on 02/13/2022 01/07/22   Yates Decamp, MD      Allergies    Patient has no known allergies.    Review of Systems   Review of Systems  Constitutional:  Negative for chills, fatigue and fever.  HENT:  Positive for congestion.   Eyes:  Negative for blurred vision, photophobia and pain.  Respiratory:  Positive for cough. Negative for chest tightness, shortness of breath and wheezing.   Cardiovascular:  Negative  for chest pain, palpitations and near-syncope.  Gastrointestinal:  Negative for abdominal pain, blood in stool, constipation, diarrhea, nausea and vomiting.  Genitourinary:  Negative for dysuria and flank pain.  Musculoskeletal:  Negative for back pain, myalgias, neck pain and neck stiffness.  Skin:  Negative for rash and wound.  Neurological:  Positive for headaches. Negative for dizziness, focal weakness, weakness, light-headedness, numbness and loss of balance.  Psychiatric/Behavioral:  Negative for agitation and confusion.   All other systems reviewed and are negative.   Physical Exam Updated Vital Signs BP (!) 145/78   Pulse 82   Temp 98.6 F (37 C)   Resp 16   Ht 5\' 9"  (1.753 m)   Wt 77.1 kg   SpO2 98%   BMI 25.10 kg/m  Physical Exam Vitals and nursing note reviewed.  Constitutional:      General: She is not in acute distress.    Appearance: She is well-developed. She is not ill-appearing, toxic-appearing or diaphoretic.  HENT:     Head: Normocephalic and atraumatic.     Mouth/Throat:     Mouth: Mucous membranes are moist.  Eyes:     Conjunctiva/sclera: Conjunctivae normal.  Cardiovascular:     Rate and Rhythm: Normal rate and regular rhythm.     Heart sounds: No murmur heard. Pulmonary:     Effort: Pulmonary effort is normal. No respiratory distress.     Breath sounds: Normal breath sounds. No wheezing, rhonchi or rales.  Chest:  Chest wall: No tenderness.  Abdominal:     Palpations: Abdomen is soft.     Tenderness: There is no abdominal tenderness.  Musculoskeletal:        General: No swelling.     Cervical back: Neck supple.  Skin:    General: Skin is warm and dry.     Capillary Refill: Capillary refill takes less than 2 seconds.     Findings: No rash.  Neurological:     Mental Status: She is alert. Mental status is at baseline.  Psychiatric:        Mood and Affect: Mood normal.     ED Results / Procedures / Treatments   Labs (all labs ordered  are listed, but only abnormal results are displayed) Labs Reviewed  COMPREHENSIVE METABOLIC PANEL - Abnormal; Notable for the following components:      Result Value   Glucose, Bld 120 (*)    Total Bilirubin 1.3 (*)    All other components within normal limits  RAPID URINE DRUG SCREEN, HOSP PERFORMED - Abnormal; Notable for the following components:   Tetrahydrocannabinol POSITIVE (*)    All other components within normal limits  I-STAT CHEM 8, ED - Abnormal; Notable for the following components:   Glucose, Bld 123 (*)    All other components within normal limits  GROUP A STREP BY PCR  RESP PANEL BY RT-PCR (RSV, FLU A&B, COVID)  RVPGX2  PROTIME-INR  APTT  CBC  DIFFERENTIAL  ETHANOL  URINALYSIS, ROUTINE W REFLEX MICROSCOPIC  I-STAT BETA HCG BLOOD, ED ([facility], [facility], [facility] ONLY)    EKG None  Radiology CT HEAD WO CONTRAST  Result Date: 07/02/2022 CLINICAL DATA:  Headache, increasing frequency. Patient has history of CVA, patient is concerning for another stroke. EXAM: CT HEAD WITHOUT CONTRAST TECHNIQUE: Contiguous axial images were obtained from the base of the skull through the vertex without intravenous contrast. RADIATION DOSE REDUCTION: This exam was performed according to the departmental dose-optimization program which includes automated exposure control, adjustment of the mA and/or kV according to patient size and/or use of iterative reconstruction technique. COMPARISON:  CT and MR examinations of September 2022 FINDINGS: Brain: No evidence of acute infarction, hemorrhage, hydrocephalus, extra-axial collection or mass lesion/mass effect. Encephalomalacia of the right basal ganglia and right insular region consistent with prior infarct. Vascular: No hyperdense vessel or unexpected calcification. Skull: Normal. Negative for fracture or focal lesion. Sinuses/Orbits: Mucous retention cysts in the right sphenoid sinus and left maxillary sinus. Other: None. IMPRESSION: 1. No acute intracranial  abnormality. 2. Encephalomalacia of the right basal ganglia and right insular region sequela of prior infarct. Electronically Signed   By: Larose Hires D.O.   On: 07/02/2022 16:35    Procedures Procedures    Medications Ordered in ED Medications - No data to display  ED Course/ Medical Decision Making/ A&P                             Medical Decision Making Amount and/or Complexity of Data Reviewed Labs: ordered. Radiology: ordered.    Kristen Grant is a 27 y.o. female with a complex past medical history including previous migraines, previous stroke status post PFO closure, asthma, anxiety, and depression who presents with several days of URI symptoms and 1 month of headaches.  According to patient, she has had intermittent headaches for about a month that come and go.  There is nothing that provokes it and it is not  the same time every day.  She denies any head trauma.  She reports it is on the right side of her head and on the left side of the head intermittently.  She denies any nausea or vomiting.  Denies any neck pain or neck stiffness.  She reports that she has had some sore throat congestion and cough for the last few days.  Denies any neurologic complaints.  Due to her history of strokes she went to make sure there was not something concerning going on intracranially.  Denies history of dural venous sinus thrombus or other acute abnormality.  On exam, lungs clear and chest nontender.  Abdomen nontender.  No focal neurologic deficits with intact sensation strength and pulses in extremities.  Normal finger-nose-finger testing.  Symmetric smile.  Clear speech.  Pupils symmetric and reactive with normal extract movements.  Patient well-appearing.  No neck stiffness.  Lungs clear and chest nontender.  No stridor.  Oropharyngeal exam showed some congestion but was otherwise reassuring.  No posterior oropharyngeal erythema or tonsillar exudate and no evidence of PTA or RPA on exam.  Patient  had workup in triage including extensive lab testing and a CT of the head.  CT head showed encephalomalacia from previous stroke but no evidence of hemorrhagic conversion or other acute abnormality.  Patient had labs that was negative for COVID/flu/RSV and was also negative for strep.  Other labs also reassuring.  Suspect viral URI causing the URI symptoms and I suspect she is developing a headache syndrome over the last month or so.  We discussed the possibility of getting further imaging such as MRI or CTV however we have low suspicion given her intermittent nature of her symptoms and history of migraines in the past.  Patient's headache is now resolved so we will hold on intervention with medications.  Patient will follow-up with outpatient neurology and PCP.  She agrees with plan for oral hydration and rest.  She no other questions or concerns and was discharged in good condition.        Final Clinical Impression(s) / ED Diagnoses Final diagnoses:  Sore throat  Viral upper respiratory tract infection  Hx of migraine headaches  Increased frequency of headaches    Rx / DC Orders ED Discharge Orders     None      Clinical Impression: 1. Sore throat   2. Viral upper respiratory tract infection   3. Hx of migraine headaches   4. Increased frequency of headaches     Disposition: Discharge  Condition: Good  I have discussed the results, Dx and Tx plan with the pt(& family if present). He/she/they expressed understanding and agree(s) with the plan. Discharge instructions discussed at great length. Strict return precautions discussed and pt &/or family have verbalized understanding of the instructions. No further questions at time of discharge.    Discharge Medication List as of 07/02/2022  9:22 PM      Follow Up: [facility] [address]     [address] [place] [address] [phone]    [facility] NEUROLOG[facility] [address] [place],  [address] [place] [address] [phone]    Larae Grooms, NP [address] [place] [address] [phone]        Artyom Stencel, Canary Brim, MD 07/03/22 [id number]

## 2022-07-06 ENCOUNTER — Telehealth: Payer: Self-pay

## 2022-07-06 NOTE — Transitions of Care (Post Inpatient/ED Visit) (Unsigned)
   07/06/2022  Name: Kristen Grant MRN: [id number] DOB: 04/15/1995  Today's TOC FU Call Status: Today's TOC FU Call Status:: Unsuccessul Call (1st Attempt) Unsuccessful Call (1st Attempt) Date: 07/06/22  Attempted to reach the patient regarding the most recent Inpatient/ED visit.  Follow Up Plan: Additional outreach attempts will be made to reach the patient to complete the Transitions of Care (Post Inpatient/ED visit) call.   Signature: Wilhemena Durie, CMA

## 2022-07-07 NOTE — Transitions of Care (Post Inpatient/ED Visit) (Unsigned)
   07/07/2022  Name: Kristen Grant MRN: [id number] DOB: 1996/01/31  Today's TOC FU Call Status: Today's TOC FU Call Status:: Unsuccessful Call (2nd Attempt) Unsuccessful Call (1st Attempt) Date: 07/06/22 Unsuccessful Call (2nd Attempt) Date: 07/07/22  Attempted to reach the patient regarding the most recent Inpatient/ED visit.  Follow Up Plan: Additional outreach attempts will be made to reach the patient to complete the Transitions of Care (Post Inpatient/ED visit) call.   Signature: Wilhemena Durie, CMA

## 2022-07-08 NOTE — Transitions of Care (Post Inpatient/ED Visit) (Signed)
   07/08/2022  Name: Kristen Grant MRN: [id number] DOB: 10/31/1995  Today's TOC FU Call Status: Today's TOC FU Call Status:: Unsuccessful Call (3rd Attempt) Unsuccessful Call (1st Attempt) Date: 07/06/22 Unsuccessful Call (2nd Attempt) Date: 07/07/22 Unsuccessful Call (3rd Attempt) Date: 07/08/22  Attempted to reach the patient regarding the most recent Inpatient/ED visit.  Follow Up Plan: No further outreach attempts will be made at this time. We have been unable to contact the patient.  Signature: Wilhemena Durie, CMA

## 2022-09-09 ENCOUNTER — Encounter: Payer: Self-pay | Admitting: Advanced Practice Midwife

## 2022-09-09 ENCOUNTER — Ambulatory Visit: Payer: Managed Care, Other (non HMO) | Admitting: Advanced Practice Midwife

## 2022-09-09 VITALS — BP 103/64 | HR 63 | Ht 69.0 in | Wt 184.0 lb

## 2022-09-09 DIAGNOSIS — N6321 Unspecified lump in the left breast, upper outer quadrant: Secondary | ICD-10-CM

## 2022-09-09 DIAGNOSIS — R87612 Low grade squamous intraepithelial lesion on cytologic smear of cervix (LGSIL): Secondary | ICD-10-CM

## 2022-09-09 DIAGNOSIS — Z3009 Encounter for other general counseling and advice on contraception: Secondary | ICD-10-CM | POA: Diagnosis not present

## 2022-09-09 DIAGNOSIS — N63 Unspecified lump in unspecified breast: Secondary | ICD-10-CM | POA: Insufficient documentation

## 2022-09-09 DIAGNOSIS — F17200 Nicotine dependence, unspecified, uncomplicated: Secondary | ICD-10-CM | POA: Diagnosis not present

## 2022-09-09 LAB — HM HIV SCREENING LAB: HM HIV Screening: NEGATIVE

## 2022-09-09 LAB — WET PREP FOR TRICH, YEAST, CLUE
Trichomonas Exam: NEGATIVE
Yeast Exam: NEGATIVE

## 2022-09-09 LAB — HEMOGLOBIN, FINGERSTICK: Hemoglobin: 14.1 g/dL (ref 11.1–15.9)

## 2022-09-09 LAB — HM HEPATITIS C SCREENING LAB: HM Hepatitis Screen: NEGATIVE

## 2022-09-09 NOTE — Progress Notes (Signed)
[facility] DEPARTMENT [facility] [address] Main Number: [phone]   Family Planning Visit- Initial Visit  Subjective:  Kristen Grant is a 27 y.o. SWF vaper G1P1001 (55 yo son)  being seen today for an initial annual visit and to discuss reproductive life planning.  The patient is currently using No Method - Other Reason for pregnancy prevention. Patient reports she/her/hers  does not want a pregnancy in the next year.    she/her/hers report they are looking for a method that provides Other doesn't want any birth control  Patient has the following medical conditions has LGSIL on Pap smear of cervix; Obesity (BMI 30-39.9); GAD (generalized anxiety disorder); ADD (attention deficit disorder); Nicotine use disorder--vaper, cigar use; Migraines ; Caffeine abuse (HCC); Hemiparesis affecting left side as late effect of stroke (HCC); Leukocytosis; Dyslipidemia; Anemia; PFO (patent foramen ovale); History of stroke x2 10/2020; Depression, major, single episode, moderate (HCC); Maternal varicella, non-immune; Substance or medication-induced depressive disorder (HCC); Cannabis use disorder, severe, dependence (HCC) daily; 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  Chief Complaint  Patient presents with   Annual Exam    pap    Patient reports here for physical, pap.  Last PE 04/18/21. Last pap 04/18/21 ASCUS HPV + with colpo 06/07/22 and ECC with rare atypical squamous cells in background of degenerating endocervical glandular epithelium; wnl per Dr. Valentino Saxon. LMP 08/18/22. Last sex last night without condom; with current partner off and on x 1 1/2 years; 1 partner in last 3 mo. Working 36-40 hrs/wk and living with her son and boyfriend. Last vaped 01/2022. Last cigar 01/2022. Last MJ yesterday. Last ETOH last night (1 glass wine) 1x/mo. Last dental exam 2 mo ago.  Highest completed grade=9 but got her GED. Hx 2 strokes and is on ASA 81 mg and has next neuro apt 10/08/22. +cry 1-2x/wk, poor sleep, appetite up and down, +moody, irritable, low energy level, +anhedonia. Agrees to call [name], LCSW to schedule apt. C/o left breast tenderness x 1 year.Drinks 2-6 c. Coffee daily, tea 1x/wk.   Patient denies cigs  Body mass index is 27.17 kg/m. - Patient is eligible for diabetes screening based on BMI> 25 and age >35?  not applicable HA1C ordered? not applicable  Patient reports 6  partner/s in last year. Desires STI screening?  Yes  Has patient been screened once for HCV in the past?  No  No results found for: "HCVAB"  Does the patient have current drug use (including MJ), have a partner with drug use, and/or has been incarcerated since last result? Yes  If yes-- Screen for HCV through [facility]   Does the patient meet criteria for HBV testing? No  Criteria:  -Household, sexual or needle sharing contact with HBV -History of drug use -HIV positive -Those with known Hep C   Health Maintenance Due  Topic Date Due   COVID-19 Vaccine (1 - 2023-24 season) Never done   PAP SMEAR-Modifier  04/19/2022   INFLUENZA VACCINE  09/03/2022    Review of Systems  Constitutional:  Positive for weight loss (30 lb wt loss over 1 1/2 mo 03/2022).  Cardiovascular:  Positive for chest pain (if doing a lot; MD in ER x2 told her it was anxiety; referred to primary care MD).  Neurological:  Positive for headaches (daily bil temples relieved with Alleve, -N&V,-audio,-vision; referred to primary care MD).  The following portions of the patient's history were reviewed and updated as appropriate: allergies, current medications, past family history, past medical history, past social history, past surgical history and problem list. Problem list updated.   See flowsheet for other program required questions.  Objective:   Vitals:   09/09/22 1026  BP: 103/64   Pulse: 63  Weight: 184 lb (83.5 kg)  Height: 5\' 9"  (1.753 m)    Physical Exam Constitutional:      Appearance: Normal appearance. She is normal weight.  HENT:     Head: Normocephalic and atraumatic.     Mouth/Throat:     Mouth: Mucous membranes are moist.     Comments: Last dental exam 2 mo ago Eyes:     Conjunctiva/sclera: Conjunctivae normal.  Cardiovascular:     Rate and Rhythm: Normal rate and regular rhythm.  Pulmonary:     Effort: Pulmonary effort is normal.     Breath sounds: Normal breath sounds.  Chest:  Breasts:    Right: Normal.     Left: Normal.       Comments: Thickened area sl tender x 1 year per pt 2:00 position in left breast outer quadrant ~1x2 cm firm Abdominal:     Palpations: Abdomen is soft.     Comments: Soft without masses or tenderness, fair tone  Genitourinary:    General: Normal vulva.     Exam position: Lithotomy position.     Vagina: Vaginal discharge (white creamy leukorrhea, ph<4.5; c/o internal & external itching x 1 mo) present.     Cervix: Normal.     Uterus: Normal.      Adnexa: Right adnexa normal and left adnexa normal.     Rectum: Normal.     Comments: Pap done Musculoskeletal:        General: Normal range of motion.     Cervical back: Normal range of motion and neck supple.  Skin:    General: Skin is warm and dry.  Neurological:     Mental Status: She is alert.  Psychiatric:        Mood and Affect: Mood normal.       Assessment and Plan:  Kristen Grant is a 27 y.o. female presenting to the [facility] Department for an initial annual wellness/contraceptive visit  Contraception counseling: Reviewed options based on patient desire and reproductive life plan. Patient is interested in No Method - Other Reason. This was provided to the patient today.  if not why not clearly documented  Risks, benefits, and typical effectiveness rates were reviewed.  Questions were answered.  Written information was also given to  the patient to review.    The patient will follow up in  1 years for surveillance.  The patient was told to call with any further questions, or with any concerns about this method of contraception.  Emphasized use of condoms 100% of the time for STI prevention.  Educated on ECP and assessed for need of ECP. Patient reported Unprotected sex within past 72 hours.  Reviewed options and patient desired No method of ECP, declined all    1. Family planning Treat wet mount per standing orders Immunization nurse consult Referral to Joellyn Quails at [facility] for mammogram  - WET PREP FOR TRICH, YEAST, CLUE - Hemoglobin, venipuncture - Chlamydia/Gonorrhea [facility] - Syphilis Serology, [facility] - HIV/HCV [facility] - IGP, Aptima HPV  2. LGSIL on Pap smear of cervix Colpo done 06/07/22 Repeat pap today  No follow-ups on file.  Future Appointments  Date Time Provider Department Center  10/07/2022 11:00 AM Micki Riley, MD [facility] None    Alberteen Spindle, CNM

## 2022-09-09 NOTE — Progress Notes (Signed)
Pt is here for PE and STD screening. Pt declined BCM.  Wet mount results reviewed, no treatment required per SO.  Pt notified of normal Hgb results. FP packet given.  Berdie Ogren, RN

## 2022-09-09 NOTE — Addendum Note (Signed)
Addended by: Arnetha Courser on: 09/09/2022 01:46 PM   Modules accepted: Orders

## 2022-09-14 ENCOUNTER — Encounter: Payer: Self-pay | Admitting: Advanced Practice Midwife

## 2022-09-14 DIAGNOSIS — R87612 Low grade squamous intraepithelial lesion on cytologic smear of cervix (LGSIL): Secondary | ICD-10-CM

## 2022-09-23 ENCOUNTER — Ambulatory Visit
Admission: RE | Admit: 2022-09-23 | Discharge: 2022-09-23 | Disposition: A | Discharge status: Home / Self Care | Payer: Managed Care, Other (non HMO) | Source: Ambulatory Visit | Attending: Advanced Practice Midwife | Admitting: Advanced Practice Midwife

## 2022-09-23 ENCOUNTER — Telehealth: Payer: Self-pay

## 2022-09-23 DIAGNOSIS — N6321 Unspecified lump in the left breast, upper outer quadrant: Secondary | ICD-10-CM | POA: Insufficient documentation

## 2022-09-23 NOTE — Telephone Encounter (Addendum)
Call pt re pap smear results from 09/09/22 specimen. Needs colpo. NIL, positive HPV (04/18/21 Hx ASCUS, + HPV)  Confirm insurance and where pt wants to go for colpo.

## 2022-09-25 NOTE — Telephone Encounter (Signed)
Phone call to pt at [phone]. Left voicemail message stating RN with [facility] is calling re pap smear results. Please call Javarus Dorner at [phone].

## 2022-09-28 NOTE — Telephone Encounter (Signed)
09/28/22 Colpo referral sent to  OBGYN through Epic's Referrals tab.

## 2022-09-28 NOTE — Telephone Encounter (Signed)
Phone call to pt at [phone]. Pt confirmed identity. Counseled pt re pap results and colpo referral.  Pt confirms she has [employer], and referral to be sent to [facility].

## 2022-10-06 ENCOUNTER — Ambulatory Visit: Payer: Managed Care, Other (non HMO) | Admitting: Obstetrics and Gynecology

## 2022-10-06 ENCOUNTER — Encounter: Payer: Self-pay | Admitting: Obstetrics and Gynecology

## 2022-10-06 ENCOUNTER — Other Ambulatory Visit (HOSPITAL_COMMUNITY)
Admission: RE | Admit: 2022-10-06 | Discharge: 2022-10-06 | Disposition: A | Discharge status: Home / Self Care | Payer: Managed Care, Other (non HMO) | Source: Ambulatory Visit | Attending: Obstetrics and Gynecology | Admitting: Obstetrics and Gynecology

## 2022-10-06 VITALS — BP 109/59 | HR 62 | Resp 16 | Ht 69.0 in | Wt 183.6 lb

## 2022-10-06 DIAGNOSIS — R87618 Other abnormal cytological findings on specimens from cervix uteri: Secondary | ICD-10-CM | POA: Insufficient documentation

## 2022-10-06 DIAGNOSIS — Z3202 Encounter for pregnancy test, result negative: Secondary | ICD-10-CM

## 2022-10-06 LAB — POCT URINE PREGNANCY: Preg Test, Ur: NEGATIVE

## 2022-10-06 NOTE — Progress Notes (Signed)
     GYNECOLOGY OFFICE COLPOSCOPY PROCEDURE NOTE  27 y.o. G1P1001 here for colposcopy for  NILM and HPV positive  pap smear on 09/09/2022. Of note, patient had NILM, HPV+ pap smear in 2023 as well. Discussed role for HPV in cervical dysplasia, need for surveillance.  Patient gave informed written consent, time out was performed.  Placed in lithotomy position. Cervix viewed with speculum and colposcope after application of acetic acid.   Colposcopy adequate? Yes  no visible lesions, no mosaicism, no punctation, and no abnormal vasculature; no corresponding biopsies obtained.  ECC specimen obtained. All specimens were labeled and sent to pathology.  Chaperone was present during entire procedure.  Patient was given post procedure instructions.  Will follow up pathology and manage accordingly; patient will be contacted with results and recommendations.  Routine preventative health maintenance measures emphasized.    Hildred Laser, MD [facility]

## 2022-10-07 ENCOUNTER — Encounter: Payer: Self-pay | Admitting: Neurology

## 2022-10-07 ENCOUNTER — Ambulatory Visit (INDEPENDENT_AMBULATORY_CARE_PROVIDER_SITE_OTHER): Payer: Managed Care, Other (non HMO) | Admitting: Neurology

## 2022-10-07 VITALS — BP 115/79 | HR 66 | Ht 69.0 in | Wt 183.6 lb

## 2022-10-07 DIAGNOSIS — G43909 Migraine, unspecified, not intractable, without status migrainosus: Secondary | ICD-10-CM | POA: Diagnosis not present

## 2022-10-07 DIAGNOSIS — G44209 Tension-type headache, unspecified, not intractable: Secondary | ICD-10-CM

## 2022-10-07 DIAGNOSIS — Z8673 Personal history of transient ischemic attack (TIA), and cerebral infarction without residual deficits: Secondary | ICD-10-CM

## 2022-10-07 MED ORDER — TOPIRAMATE 25 MG PO TABS: 25.0000 mg | ORAL_TABLET | Freq: Two times a day (BID) | ORAL | 3 refills | Status: DC

## 2022-10-07 NOTE — Progress Notes (Signed)
[facility] [address] [place]. [place] [address] [phone]       STROKE FOLLOW UP NOTE  Ms. Kristen Grant Date of Birth:  [birth year] Medical Record Number:  [id number]   Reason for Referral: stroke follow up    SUBJECTIVE:   CHIEF COMPLAINT:  Chief Complaint  Patient presents with   Follow-up    Patient in room #20 and alone. Patient states she been having a lot of headaches lately and she use to take tylenol but it doesn't work anymore.    HPI:  Update 10/07/2022 : She returns for follow-up after last visit a year and a half ago with Kristen Grant nurse practitioner.  Patient states she is doing well from the stroke standpoint without recurrent stroke or TIA symptoms since her initial stroke in September 2022.  She continues to have left hand weakness and left leg stiffness.  Fine motor skills are diminished on the left.  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  She is tolerating aspirin without side effects.  She has not had recent lab work to check lipid and A1c checked for quite some time.  She has noted increased frequency of her headaches for the last 6 months or so.  Headaches occur 4-5 times a week.  She was previously finding relief with Tylenol but there is no longer working she is switched to taking Aleve instead.  She has to take 1 or 2 tablets about 4 to 5 days a week for symptomatic relief.  She describes the headaches mostly pressure-like but at times can be severe 8/10 in severity.  She is able to walk most of the time but occasionally has to sit down.  There is some accompanying nausea and light and sound sensitivity.  She does not have to lie down.  She is unable to name specific triggers except physical activities like sex and stress.  She does complain of some muscle tightness in the back of her neck.  She does not do any neck stretching  exercises.  She did have endovascular PFO closure on 12/10/2020 by Dr. Jacinto Halim but has not had any follow-up TCD bubble study done. Update 04/21/2021 JM: 27 year old female who returns for stroke follow-up unaccompanied.  Overall doing well since prior visit.  She has since completed therapies reporting residual left hand and toe numbness (especially if gets cold) and mild left hand weakness with occasional twitching/tremors with fatigue and occasional left foot slapping/dragging with fatigue. She does admit to not routinely doing any exercises as advised by therapies.  She has since returned back to work at [employer] and driving as well as maintaining ADLs and IADLs independently.  Denies new stroke/TIA symptoms.  She underwent PFO closure on 12/10/2020 without complication.  Migraine headaches greatly improved since PFO closure and essentially resolved - only 2 headaches since closure.  She has since stopped topiramate as no longer needed.  She is closely being followed by psychiatry for depression/anxiety currently on Vistaril, mirtazapine and venlafaxine.  Compliant on atorvastatin without side effects. She has since stopped aspirin as she was told this was no longer needed. She did have some mild bruising and cold sensation while taking. Blood pressure today 117/70.  She has since establish care with PCP Larae Grooms, NP and following with OB/GYN.  No further concerns at this time.       History provided for reference purposes only  Update 12/03/2020 JM: Ms. Kristen Grant is being seen for initial hospital stroke follow-up unaccompanied. She has been making excellent recovery.  Continues to work with PT/OT at [facility] 2x weekly. Reports continued mild left arm weakness but more so difficulty with fine motor skills. Also reports decreased activity tolerance and complaints of fatigue - notices LLE dragging at that time. Also c/o right lower back pain. Reports speaking with therapy re: AFO brace but has not heard  anything else.  Currently on short-term disability which ends 11/6.  She is looking forward to returning back to work at [employer] but she is unsure if she will be abel to return 11/6 as she is scheduled for PFO closure on 11/8. She has since returned back to driving without difficulty. Able to maintain majority of ADLs and IADLs independently. Headaches well controlled on topiramate 25 mg twice daily but ran out of rx 3-4 days ago and has noticed slight increase in headaches.   Denies new stroke/TIA symptoms.  Reports compliance on aspirin 81 mg daily but recently ran out of atorvastatin. Denies side effects.  Blood pressure today 110/75. Eval by Dr. Jacinto Halim on 11/29/2020 for PFO closure - she is scheduled for closure on 12/10/2020. Appt scheduled 11/14 with Rodman Pickle, NP to establish care as PCP.  No further concerns at this time    Stroke admission 10/11/2020 Kristen Grant is a 27 y.o. female with PMHx of anxiety, asthma, migraine headaches and on birth control pills who presented on 10/11/2020 with left-sided weakness.  On arrival to ED, slight right gaze deviation, left facial droop, dysarthria and inability to move left side as well as c/o right-sided headache.  Personally reviewed hospitalization pertinent progress notes, lab work and imaging.  Evaluated by Dr. Pearlean Brownie.  Stroke work-up revealed right MCA infarct in setting of right M1 occlusion s/p IV TNKase and mechanical thrombectomy with TICI 3 revascularization require repeat thrombectomy 24 hours later for reocclusion initiate pain TICI 2B-2C revascularization, infarct embolic secondary to cryptogenic etiology. MRI brain showed right MCA territory infarcts predominantly involving right caudate, putamen, anterior limb of R IC, insula, operculum and overlying right frontal lobe as well as a few punctate acute infarcts in the right parietal lobe as well as associated cytotoxic edema with slight (2-55mm) leftward midline shift at the foramen of  Monro and probable small amount of hemorrhage in the posterior right frontal lobe/insular region.  EF 60 to 65%.  LDL 117.  A1c 5.0.  Hypercoagulable labs negative.  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 LE Dopplers negative for DVT. TEE noted small PFO.  Placed on aspirin 81 mg daily and atorvastatin 80 mg daily.  UDS positive for THC.  Recommended discontinuing birth control pill and to follow-up with OB/GYN for alternative long-term contraceptive methods.  No prior stroke history.  Therapy eval's recommended CIR for residual left hemiparesis and dysarthria.      PERTINENT IMAGING  Code Stroke  CT head hypodensity at right anterior temporal lobe.   ASPECTS 10.     CTA head & neck:   1. Thrombus in the distal right M1 segment with near complete occlusion. There is reconstitution of flow in superior M2 division but no inferior M2 division is identified. 2. 11 cc infarct core in the right corona radiata, 75 cc penumbra. CT perfusion : CBF (<30%) Volume: 11mL Perfusion (Tmax>6.0s) volume: 86mL Mismatch Volume: 75mL Infarction Location:Right corona radiata Cerebral angio : report from Dr. Corliss Skains  bilateral common carotid  and Lt VA angiograms followed by complete revascularization of occluded LT MCA M1 seg with x pass with contact aspiration and x1 pass with solitaire 3mm x 40 mm retriever and contact aspiration achieving a TICI 3 revascularization. Post CT brain No ICH . 5.MRI BRAIN:   Acute anterior right MCA territory infarct, predominantly involving the right caudate, putamen, anterior limb of the right internal capsule, insula, operculum and overlying right frontal lobe. A few punctate acute infarcts in the right parietal lobe.  Associated cytotoxic edema with slight (2-3 mm) leftward midline shift at the foramen of Monro. Probable small amount of hemorrhage in the posterior right frontal lobe/insular region. 6. 2D Echo LVEF 60-65%, normal LA, poorly visualized  interatrial septum  7. 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   Stroke (HCC)     PSH:  Past Surgical History:  Procedure Laterality Date   BUBBLE STUDY  10/17/2020   Procedure: BUBBLE STUDY;  Surgeon: Jake Bathe, MD;  Location: [facility] ENDOSCOPY;  Service: Cardiovascular;;   IR ANGIO INTRA EXTRACRAN SEL COM CAROTID INNOMINATE UNI L MOD SED  10/11/2020   IR ANGIO VERTEBRAL SEL VERTEBRAL UNI L MOD SED  10/11/2020   IR CT HEAD LTD  10/11/2020   IR CT HEAD LTD  10/12/2020   IR PERCUTANEOUS ART THROMBECTOMY/INFUSION INTRACRANIAL INC DIAG ANGIO  10/11/2020   IR PERCUTANEOUS ART THROMBECTOMY/INFUSION INTRACRANIAL INC DIAG ANGIO  10/12/2020   NO PAST SURGERIES     PATENT FORAMEN OVALE(PFO) CLOSURE N/A 12/10/2020   Procedure: PATENT FORAMEN OVALE (PFO) CLOSURE;  Surgeon: Yates Decamp, MD;  Location: [facility] INVASIVE CV LAB;  Service: Cardiovascular;  Laterality: N/A;   RADIOLOGY WITH ANESTHESIA N/A 10/12/2020   Procedure: RADIOLOGY WITH ANESTHESIA;  Surgeon: Radiologist, Medication, MD;  Location: [facility] OR;  Service: Radiology;  Laterality: N/A;   RADIOLOGY WITH ANESTHESIA N/A 10/11/2020   Procedure: IR WITH ANESTHESIA;  Surgeon: Radiologist, Medication, MD;  Location: [facility] OR;  Service: Radiology;  Laterality: N/A;   TEE WITHOUT CARDIOVERSION N/A 10/17/2020   Procedure: TRANSESOPHAGEAL ECHOCARDIOGRAM (TEE);  Surgeon: Jake Bathe, MD;  Location: [facility] ENDOSCOPY;  Service: Cardiovascular;  Laterality: N/A;    Social History:  Social History   Socioeconomic History   Marital status: Single    Spouse name: Not on file   Number of children: 1   Years of education: Not on file   Highest education  level: GED or equivalent  Occupational History   Not on file  Tobacco Use   Smoking status: Every Day    Types: E-cigarettes, Cigars    Start date: 2021   Smokeless tobacco: Never  Vaping Use   Vaping status: Former   Quit date: 01/02/2022   Substances: Nicotine, THC   Devices: Patient tring to quit  Substance and Sexual Activity   Alcohol use: Yes    Alcohol/week: 1.0 standard drink of alcohol    Types: 1 Cans of beer per week    Comment: last use 09/08/22 1x/mo   Drug use: Yes    Frequency: 3.0 times per week    Types: Marijuana  Comment: daily   Sexual activity: Yes    Partners: Male    Birth control/protection: None  Other Topics Concern   Not on file  Social History Narrative   Not on file   Social Determinants of Health   Financial Resource Strain: Not on file  Food Insecurity: Not on file  Transportation Needs: Not on file  Physical Activity: Not on file  Stress: Not on file  Social Connections: Not on file  Intimate Partner Violence: Not At Risk (09/09/2022)   Humiliation, Afraid, Rape, and Kick questionnaire    Fear of Current or Ex-Partner: No    Emotionally Abused: No    Physically Abused: No    Sexually Abused: No    Family History:  Family History  Problem Relation Age of Onset   Diabetes Mother    Hypertension Mother    Epilepsy Mother    Anxiety disorder Mother    Depression Mother    Migraines Mother    Diabetes Maternal Grandmother    Stroke Maternal Grandmother    Hypertension Maternal Grandmother    Asthma Half-Brother    Asthma Half-Sister    Breast cancer Neg Hx     Medications:   Current Outpatient Medications on File Prior to Visit  Medication Sig Dispense Refill   aspirin (ASPIRIN CHILDRENS) 81 MG chewable tablet Chew 1 tablet (81 mg total) by mouth daily.     No current facility-administered medications on file prior to visit.    Allergies:  No Known Allergies    OBJECTIVE:  Physical Exam  Vitals:   10/07/22 1058   BP: 115/79  Pulse: 66  Weight: 183 lb 9.6 oz (83.3 kg)  Height: 5\' 9"  (1.753 m)    Body mass index is 27.11 kg/m. No results found.  General: well developed, well nourished,   pleasant young Caucasian female, seated, in no evident distress Head: head normocephalic and atraumatic.   Neck: supple with no carotid or supraclavicular bruits Cardiovascular: regular rate and rhythm, no murmurs Musculoskeletal: no deformity but mild stiffness of posterior neck muscles and tenderness. Skin:  no rash/petichiae Vascular:  Normal pulses all extremities   Neurologic Exam Mental Status: Awake and fully alert.  Fluent speech and language.  Oriented to place and time. Recent and remote memory intact. Attention span, concentration and fund of knowledge appropriate. Mood and affect appropriate.  Cranial Nerves: Pupils equal, briskly reactive to light. Extraocular movements full without nystagmus. Visual fields full to confrontation. Hearing intact. Facial sensation intact. Left nasolabial fold flattening.  Tongue and palate moves normally and symmetrically.  Motor: Normal bulk and tone. Normal strength in all tested extremity muscles except slight decreased left hand dexterity mild weakness of left grip.  Diminished fine finger movements on the left and orbits right over left upper extremity. Sensory.: intact to touch , pinprick , position and vibratory sensation.  Coordination: Rapid alternating movements normal in all extremities except slightly decreased left hand. Finger-to-nose and heel-to-shin performed accurately bilaterally.  Orbits right arm over left arm. Gait and Station: Arises from chair without difficulty. Stance is normal. Gait demonstrates normal stride length and balance without use of assistive device but dragging of the left leg.. Tandem walk and heel toe with mild difficulty.  Reflexes: 1+ and symmetric. Toes downgoing.         ASSESSMENT: SEVANAH BARDON is a 27 y.o. year old  female with recent right MCA infarcts in setting of right M1 occlusion s/p IV TNKase and IR with TICI  3 revascularization on 10/11/2020 and repeat thrombectomy 24 hours later for reocclusion of dominant superior division of the right MCA achieving TICI 2B-2C revascularization on 10/12/2020, infarct embolic of cryptogenic etiology. Vascular risk factors include evidence of PFO s/p closure 12/2020, remote migraine headaches, HLD, prior birth control use and THC use.  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  I recommend she limit using ibuprofen for not more than 1 or 2 days a week.  Trial of Topamax 25 mg twice daily for headache prophylaxis.  I also encouraged her to do regular neck stretching exercises.  Continue aspirin for stroke prevention and maintain aggressive risk factor modification with strict control of hypertension with blood pressure goal below 140/90, lipids with LDL cholesterol goal below 70 mg percent and diabetes with hemoglobin A1c goal below 6.5%.  She was encouraged to eat a healthy diet with lots of fruits, vegetables, cereals and whole grains and to be active and exercise regularly.  Check follow-up lipid profile, hemoglobin A1c and transcranial Doppler bubble study to look for adequacy of PFO closure.  She will return for follow-up in the future in 3 months with Kristen Grant nurse practitioner or  call earlier if necessary.  Greater than 50% time during this 40-minute visit was spent in coordination of care about her transformed migraine headaches, history of PFO closure and stroke and residual mild left-sided weakness and answering questions. Delia Heady, MD  [facility] [address] [place], [place] [address]  Phone [phone] Fax [phone] Note: This document was prepared with digital dictation and possible smart phrase technology. Any transcriptional errors that result from this process are unintentional.

## 2022-10-07 NOTE — Patient Instructions (Signed)
I had a long discussion with the patient regarding her worsening headaches which sound like mixed migraine and tension headaches with a component of analgesic rebound.  I recommend she limit using ibuprofen for not more than 1 or 2 days a week.  Trial of Topamax 25 mg twice daily for headache prophylaxis.  I also encouraged her to do regular neck stretching exercises.  Continue aspirin for stroke prevention and maintain aggressive risk factor modification with strict control of hypertension with blood pressure goal below 140/90, lipids with LDL cholesterol goal below 70 mg percent and diabetes with hemoglobin A1c goal below 6.5%.  She was encouraged to eat a healthy diet with lots of fruits, vegetables, cereals and whole grains and to be active and exercise regularly.  Check follow-up lipid profile, hemoglobin A1c and transcranial Doppler bubble study to look for adequacy of PFO closure.  She will return for follow-up in the future in 3 months with Shanda Bumps nurse practitioner or  call earlier if necessary.  Neck Exercises Ask your health care provider which exercises are safe for you. Do exercises exactly as told by your health care provider and adjust them as directed. It is normal to feel mild stretching, pulling, tightness, or discomfort as you do these exercises. Stop right away if you feel sudden pain or your pain gets worse. Do not begin these exercises until told by your health care provider. Neck exercises can be important for many reasons. They can improve strength and maintain flexibility in your neck, which will help your upper back and prevent neck pain. Stretching exercises Rotation neck stretching  Sit in a chair or stand up. Place your feet flat on the floor, shoulder-width apart. Slowly turn your head (rotate) to the right until a slight stretch is felt. Turn it all the way to the right so you can look over your right shoulder. Do not tilt or tip your head. Hold this position for 10-30  seconds. Slowly turn your head (rotate) to the left until a slight stretch is felt. Turn it all the way to the left so you can look over your left shoulder. Do not tilt or tip your head. Hold this position for 10-30 seconds. Repeat __________ times. Complete this exercise __________ times a day. Neck retraction  Sit in a sturdy chair or stand up. Look straight ahead. Do not bend your neck. Use your fingers to push your chin backward (retraction). Do not bend your neck for this movement. Continue to face straight ahead. If you are doing the exercise properly, you will feel a slight sensation in your throat and a stretch at the back of your neck. Hold the stretch for 1-2 seconds. Repeat __________ times. Complete this exercise __________ times a day. Strengthening exercises Neck press  Lie on your back on a firm bed or on the floor with a pillow under your head. Use your neck muscles to push your head down on the pillow and straighten your spine. Hold the position as well as you can. Keep your head facing up (in a neutral position) and your chin tucked. Slowly count to 5 while holding this position. Repeat __________ times. Complete this exercise __________ times a day. Isometrics These are exercises in which you strengthen the muscles in your neck while keeping your neck still (isometrics). Sit in a supportive chair and place your hand on your forehead. Keep your head and face facing straight ahead. Do not flex or extend your neck while doing isometrics. Push forward with  your head and neck while pushing back with your hand. Hold for 10 seconds. Do the sequence again, this time putting your hand against the back of your head. Use your head and neck to push backward against the hand pressure. Finally, do the same exercise on either side of your head, pushing sideways against the pressure of your hand. Repeat __________ times. Complete this exercise __________ times a day. Prone head  lifts  Lie face-down (prone position), resting on your elbows so that your chest and upper back are raised. Start with your head facing downward, near your chest. Position your chin either on or near your chest. Slowly lift your head upward. Lift until you are looking straight ahead. Then continue lifting your head as far back as you can comfortably stretch. Hold your head up for 5 seconds. Then slowly lower it to your starting position. Repeat __________ times. Complete this exercise __________ times a day. Supine head lifts  Lie on your back (supine position), bending your knees to point to the ceiling and keeping your feet flat on the floor. Lift your head slowly off the floor, raising your chin toward your chest. Hold for 5 seconds. Repeat __________ times. Complete this exercise __________ times a day. Scapular retraction  Stand with your arms at your sides. Look straight ahead. Slowly pull both shoulders (scapulae) backward and downward (retraction) until you feel a stretch between your shoulder blades in your upper back. Hold for 10-30 seconds. Relax and repeat. Repeat __________ times. Complete this exercise __________ times a day. Contact a health care provider if: Your neck pain or discomfort gets worse when you do an exercise. Your neck pain or discomfort does not improve within 2 hours after you exercise. If you have any of these problems, stop exercising right away. Do not do the exercises again unless your health care provider says that you can. Get help right away if: You develop sudden, severe neck pain. If this happens, stop exercising right away. Do not do the exercises again unless your health care provider says that you can. This information is not intended to replace advice given to you by your health care provider. Make sure you discuss any questions you have with your health care provider. Document Revised: 07/16/2020 Document Reviewed: 07/16/2020 Elsevier Patient  Education  2024 [employer].

## 2022-10-08 ENCOUNTER — Other Ambulatory Visit: Payer: Self-pay | Admitting: Cardiology

## 2022-10-08 DIAGNOSIS — Z8774 Personal history of (corrected) congenital malformations of heart and circulatory system: Secondary | ICD-10-CM

## 2022-10-08 DIAGNOSIS — I749 Embolism and thrombosis of unspecified artery: Secondary | ICD-10-CM

## 2022-10-08 LAB — LIPID PANEL
Chol/HDL Ratio: 3.5 ratio (ref 0.0–4.4)
Cholesterol, Total: 181 mg/dL (ref 100–199)
HDL: 52 mg/dL (ref >39)
LDL Chol Calc (NIH): 112 mg/dL — ABNORMAL HIGH (ref 0–99)
Triglycerides: 94 mg/dL (ref 0–149)
VLDL Cholesterol Cal: 17 mg/dL (ref 5–40)

## 2022-10-08 LAB — SURGICAL PATHOLOGY

## 2022-10-08 LAB — HEMOGLOBIN A1C
Est. average glucose Bld gHb Est-mCnc: 97 mg/dL
Hgb A1c MFr Bld: 5 % (ref 4.8–5.6)

## 2022-10-08 NOTE — Progress Notes (Signed)
Colpo completed by Hildred Laser, MD with [facility] on 10/06/22.  Reference surgical pathology lab result in Epic dated 10/06/22.  Copied and pasted from Dr. Oretha Milch 10/08/22 follow-up notes: Result Care Coordination Patient Communication  Edit Comments   Add Notifications  Back to Top   Kristen Grant,   Your biopsy was benign. Just have your pap smear repeated in 1 year.  Written by Hildred Laser, MD on 10/08/2022 11:27 AM EDT

## 2022-10-09 ENCOUNTER — Other Ambulatory Visit: Payer: Self-pay | Admitting: Neurology

## 2022-10-09 MED ORDER — ATORVASTATIN CALCIUM 40 MG PO TABS: 40.0000 mg | ORAL_TABLET | Freq: Every day | ORAL | 3 refills | Status: DC

## 2022-10-09 MED ORDER — ASPIRIN 81 MG PO CHEW: 81.0000 mg | CHEWABLE_TABLET | Freq: Every day | ORAL | Status: DC

## 2022-10-09 NOTE — Progress Notes (Signed)
Kindly inform the patient had lipid profile shows that bad cholesterol is not satisfactory.  I recommend starting atorvastatin 40 mg daily and I will prescribe it

## 2022-10-12 ENCOUNTER — Telehealth: Payer: Self-pay | Admitting: Anesthesiology

## 2022-10-12 ENCOUNTER — Telehealth: Payer: Self-pay | Admitting: Neurology

## 2022-10-12 NOTE — Telephone Encounter (Signed)
-----   Message from Delia Heady sent at 10/09/2022  5:07 PM EDT ----- Kristen Grant inform the patient had lipid profile shows that bad cholesterol is not satisfactory.  I recommend starting atorvastatin 40 mg daily and I will prescribe it

## 2022-10-12 NOTE — Telephone Encounter (Signed)
Pt called and LVM stating that every since she started the Topamax her migraines have gotten worse and she would like to know if the dosage can be lowered or if the medication can be changed. Please advise.

## 2022-10-26 ENCOUNTER — Other Ambulatory Visit: Payer: Self-pay | Admitting: Neurology

## 2022-10-26 ENCOUNTER — Ambulatory Visit (HOSPITAL_COMMUNITY)
Admission: RE | Admit: 2022-10-26 | Discharge: 2022-10-26 | Disposition: A | Discharge status: Home / Self Care | Payer: Managed Care, Other (non HMO) | Source: Ambulatory Visit | Attending: Neurology | Admitting: Neurology

## 2022-10-26 DIAGNOSIS — Z8673 Personal history of transient ischemic attack (TIA), and cerebral infarction without residual deficits: Secondary | ICD-10-CM | POA: Insufficient documentation

## 2022-10-26 NOTE — Progress Notes (Signed)
I saw pt today for TCD Bubble study as outpatient and it was positive Grade # spencer. Will ask Drganji to do TEE to look for inadequate closure/ recurrence of PFO. Also has recrudescence of migraines so will increase Topamax to 50 mg twice dialy

## 2022-10-26 NOTE — Progress Notes (Signed)
VASCULAR LAB    TCD bubble study has been performed.  See CV proc for preliminary results.   Mayan Dolney, RVT 10/26/2022, 1:35 PM

## 2022-12-23 ENCOUNTER — Other Ambulatory Visit: Payer: Self-pay | Admitting: Neurology

## 2023-01-18 IMAGING — MR MR HEAD W/O CM
12 of 13 series · 44 of 48 positions shown · non-contrast
Comparison: CT head 10/12/2020.

CLINICAL DATA: Stroke, follow up

EXAM:
MRI HEAD WITHOUT CONTRAST
TECHNIQUE: Multiplanar, multiecho pulse sequences of the brain and surrounding
structures were obtained without intravenous contrast.

[Series 9: DWI · axial · 3.0mm · 0.88mm/px · z∈[-134,+19]mm · 8 of 104 slices shown (1 of 4)]
[im 1/104]
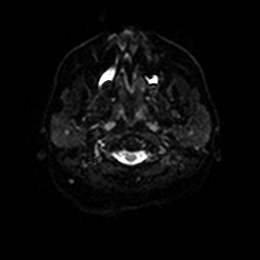
[im 15/104]
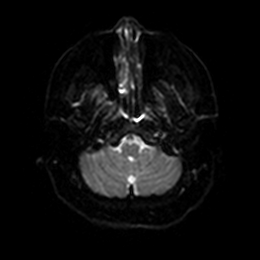
[im 30/104]
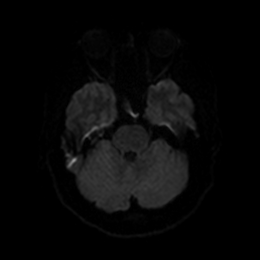
[im 45/104]
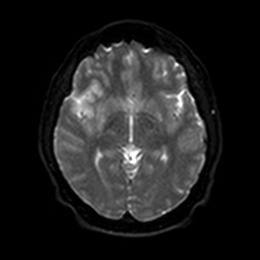
[im 59/104]
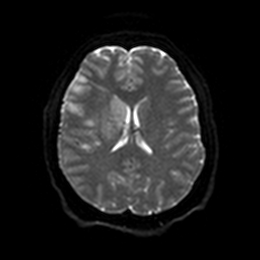
[im 74/104]
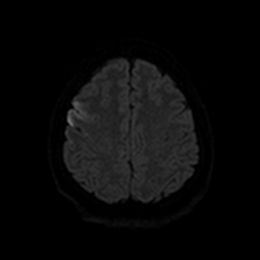
[im 89/104]
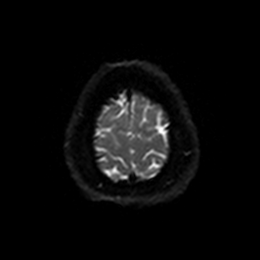
[im 104/104]
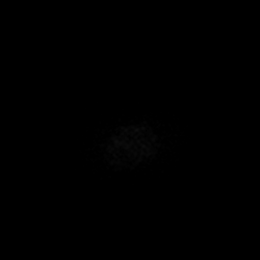

[Series 10: DWI · axial · 3.0mm · 0.88mm/px · z∈[-134,+19]mm · 4 of 52 slices shown (2 of 4)]
[im 1/52]
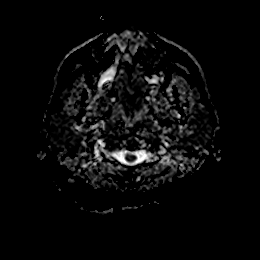
[im 18/52]
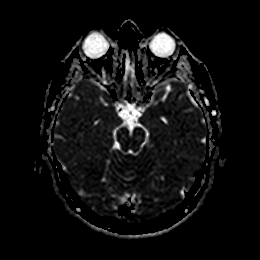
[im 35/52]
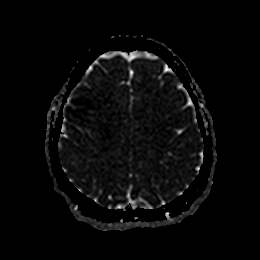
[im 52/52]
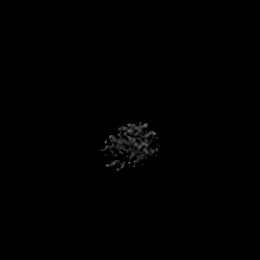

[Series 11: DWI · coronal · 4.0mm · 0.88mm/px · 6 of 72 slices shown (3 of 4)]
[im 1/72]
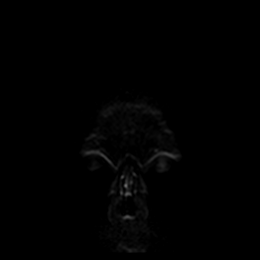
[im 15/72]
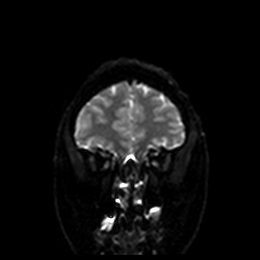
[im 29/72]
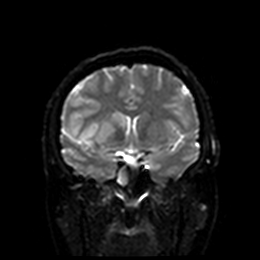
[im 43/72]
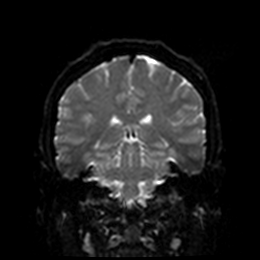
[im 57/72]
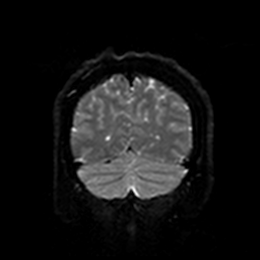
[im 72/72]
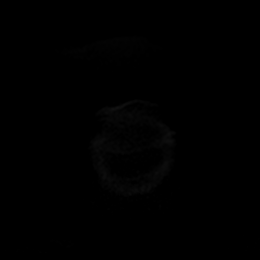

[Series 12: DWI · coronal · 4.0mm · 0.88mm/px · 3 of 36 slices shown (4 of 4)]
[im 1/36]
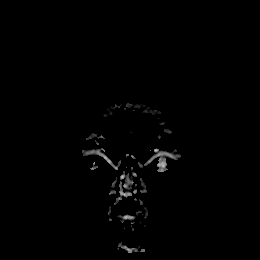
[im 18/36]
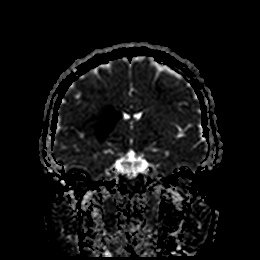
[im 36/36]
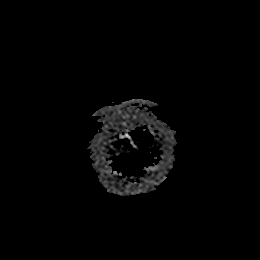

[Series 13: T1 · sagittal · 5.0mm · 0.75mm/px · 2 of 25 slices shown]
[im 1/25]
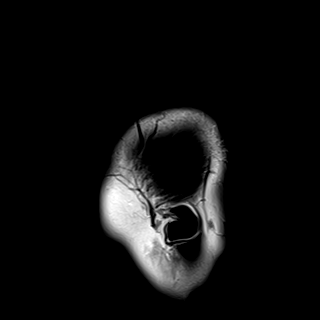
[im 25/25]
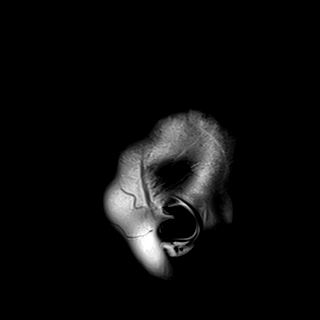

[Series 14: T2 · axial · 5.0mm · 0.72mm/px · z∈[-132,+17]mm · 2 of 26 slices shown (1 of 2)]
[im 1/26]
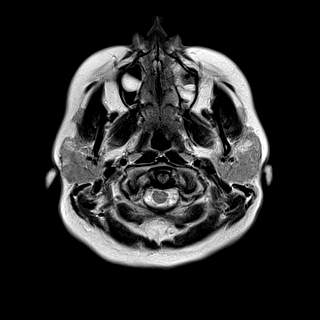
[im 26/26]
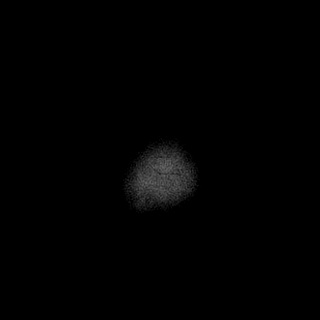

[Series 15: FLAIR · axial · 5.0mm · 0.45mm/px · z∈[-132,+18]mm · 2 of 26 slices shown]
[im 1/26]
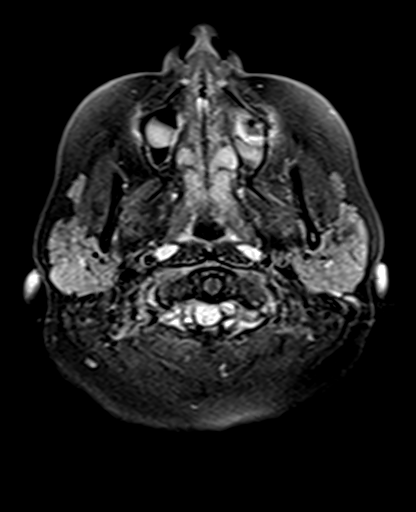
[im 26/26]
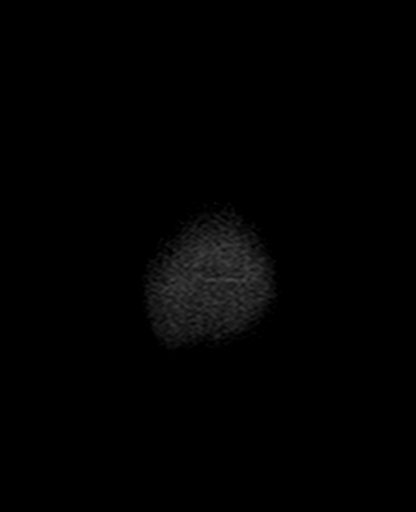

[Series 16: mag_images · axial · 3.0mm · 0.90mm/px · z∈[-133,+20]mm · 4 of 52 slices shown]
[im 1/52]
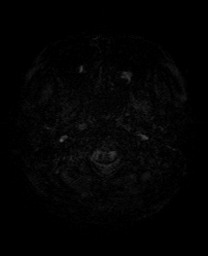
[im 18/52]
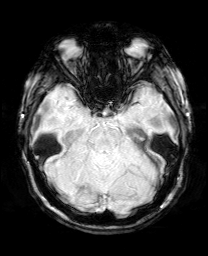
[im 35/52]
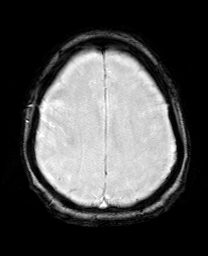
[im 52/52]
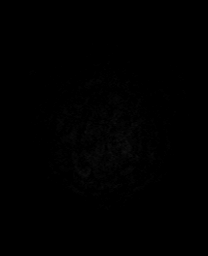

[Series 17: pha_images · axial · 3.0mm · 0.90mm/px · z∈[-133,+17]mm · 4 of 51 slices shown]
[im 1/51]
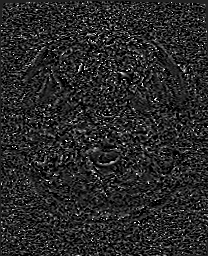
[im 17/51]
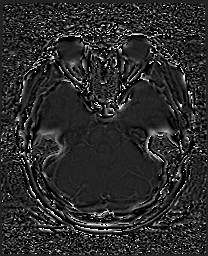
[im 34/51]
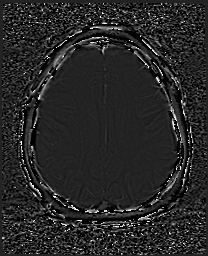
[im 51/51]
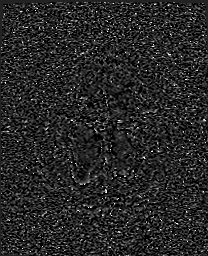

[Series 18: swi_images · axial · 3.0mm · 0.90mm/px · z∈[-133,+20]mm · 4 of 52 slices shown]
[im 1/52]
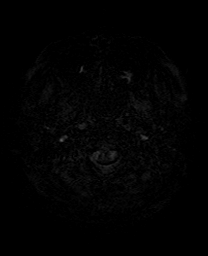
[im 18/52]
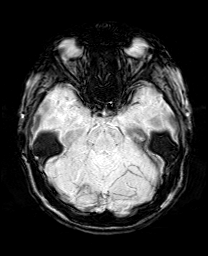
[im 35/52]
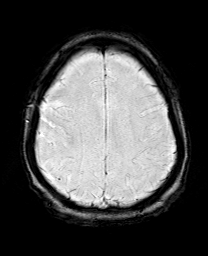
[im 52/52]
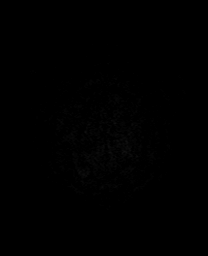

[Series 19: mip_images(sw) · axial · 24.0mm · 0.90mm/px · z∈[-123,+9]mm · 3 of 45 slices shown]
[im 1/45]
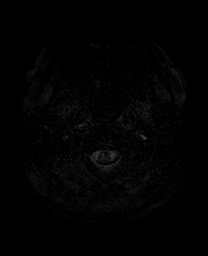
[im 23/45]
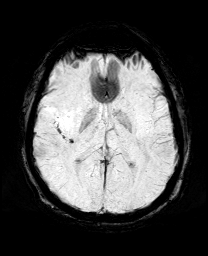
[im 45/45]
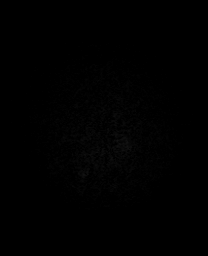

[Series 21: T2 · coronal · 5.0mm · 0.34mm/px · 2 of 30 slices shown (2 of 2)]
[im 1/30]
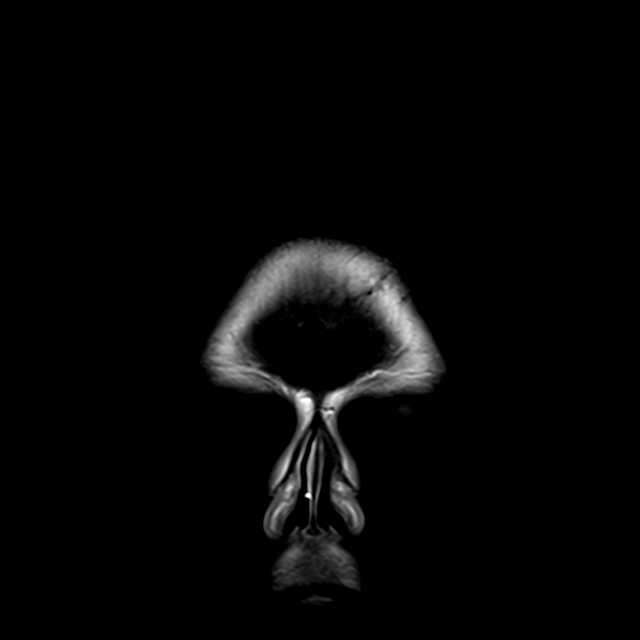
[im 30/30]
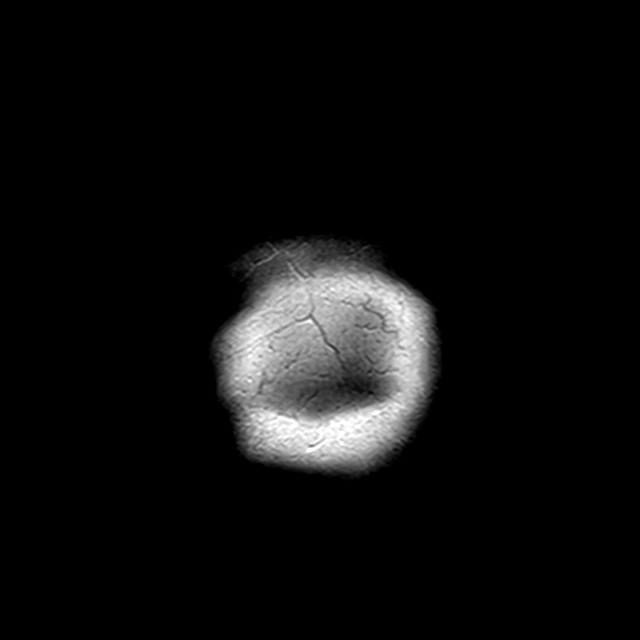

[44 of 48 positions shown; findings below may reference images not displayed]

FINDINGS: Brain: Acute infarct involving the anterior right MCA territory,
predominantly including the caudate, putamen, anterior limb of the
right internal capsule, insula, operculum and overlying right
frontal lobe. A few punctate acute infarcts in the right parietal
lobe. Associated cytotoxic edema with slight (2-3 mm) leftward
midline shift at the foramen of Medgyes. Small amount of
susceptibility artifact in the posterior right frontal lobe/insular
region, suggestive of small volume hemorrhage. No hydrocephalus. No
extra-axial fluid collection.

Vascular: Major arterial flow voids are maintained at the skull
base.

Skull and upper cervical spine: Normal marrow signal.

Sinuses/Orbits: Right sphenoid sinus retention cyst. Otherwise,
largely clear sinuses. Unremarkable orbits.

Other: No mastoid effusions.
IMPRESSION: 1. Acute anterior right MCA territory infarct, predominantly
involving the right caudate, putamen, anterior limb of the right
internal capsule, insula, operculum and overlying right frontal
lobe. A few punctate acute infarcts in the right parietal lobe.
2. Associated cytotoxic edema with slight (2-3 mm) leftward midline
shift at the foramen of Medgyes.
3. Probable small amount of hemorrhage in the posterior right
frontal lobe/insular region.

## 2023-02-09 ENCOUNTER — Other Ambulatory Visit: Payer: Self-pay | Admitting: Neurology

## 2023-02-09 NOTE — Telephone Encounter (Signed)
 Rx refilled per last office visit note.

## 2023-02-18 IMAGING — CR DG CHEST 2V
2 series · 2 of 2 positions shown · non-contrast
Comparison: 12/21/2011

CLINICAL DATA: Chest pain starting yesterday.

EXAM:
CHEST - 2 VIEW

[chest pa]
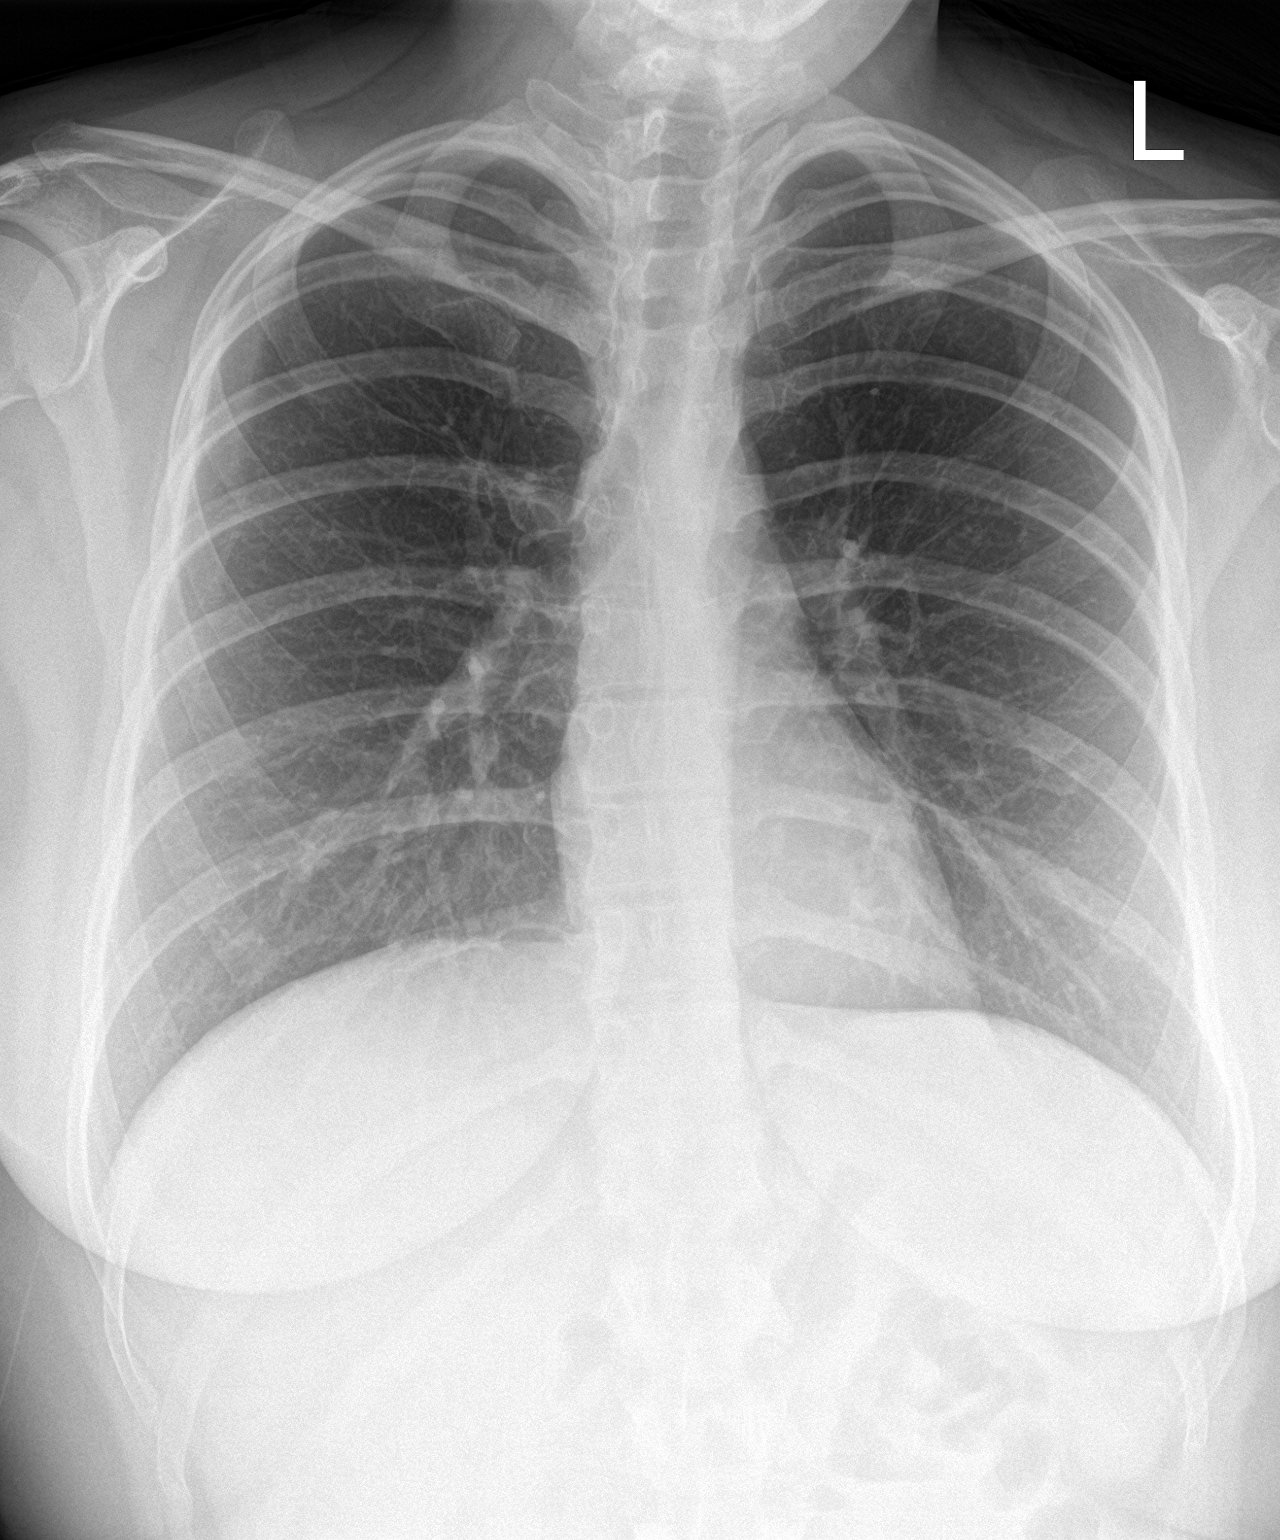

[chest lat]
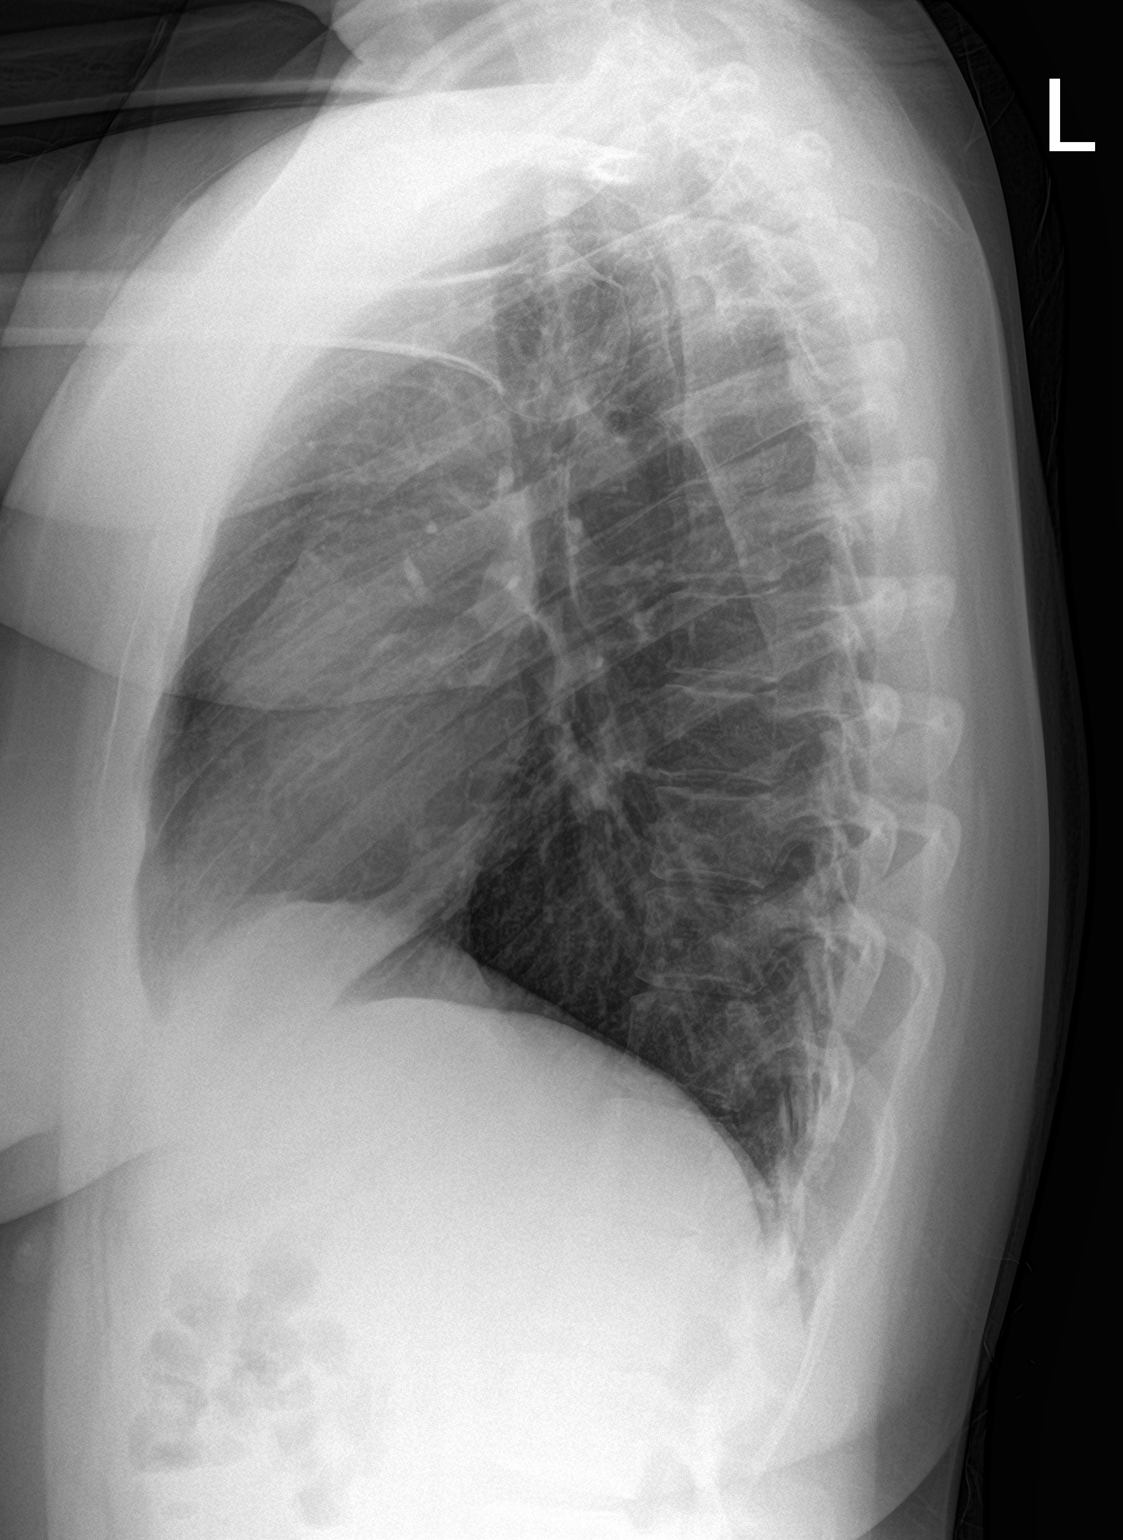

[2 of 2 positions shown; findings below may reference images not displayed]

FINDINGS: The heart size and mediastinal contours are within normal limits.
Both lungs are clear. The visualized skeletal structures are
unremarkable.
IMPRESSION: No active cardiopulmonary disease.

## 2023-03-04 ENCOUNTER — Encounter: Payer: Self-pay | Admitting: Intensive Care

## 2023-03-04 ENCOUNTER — Emergency Department
Admission: EM | Admit: 2023-03-04 | Discharge: 2023-03-04 | Disposition: A | Discharge status: Home / Self Care | Payer: Managed Care, Other (non HMO) | Attending: Emergency Medicine | Admitting: Emergency Medicine

## 2023-03-04 ENCOUNTER — Other Ambulatory Visit: Payer: Self-pay

## 2023-03-04 ENCOUNTER — Emergency Department: Payer: Managed Care, Other (non HMO)

## 2023-03-04 DIAGNOSIS — R0789 Other chest pain: Secondary | ICD-10-CM | POA: Diagnosis not present

## 2023-03-04 DIAGNOSIS — Z7982 Long term (current) use of aspirin: Secondary | ICD-10-CM | POA: Insufficient documentation

## 2023-03-04 DIAGNOSIS — R079 Chest pain, unspecified: Secondary | ICD-10-CM | POA: Diagnosis present

## 2023-03-04 LAB — CBC
HCT: 40.7 % (ref 36.0–46.0)
Hemoglobin: 13.8 g/dL (ref 12.0–15.0)
MCH: 31.4 pg (ref 26.0–34.0)
MCHC: 33.9 g/dL (ref 30.0–36.0)
MCV: 92.7 fL (ref 80.0–100.0)
Platelets: 251 10*3/uL (ref 150–400)
RBC: 4.39 MIL/uL (ref 3.87–5.11)
RDW: 12.1 % (ref 11.5–15.5)
WBC: 6.3 10*3/uL (ref 4.0–10.5)
nRBC: 0 % (ref 0.0–0.2)

## 2023-03-04 LAB — BASIC METABOLIC PANEL
Anion gap: 8 (ref 5–15)
BUN: 7 mg/dL (ref 6–20)
CO2: 21 mmol/L — ABNORMAL LOW (ref 22–32)
Calcium: 9.2 mg/dL (ref 8.9–10.3)
Chloride: 109 mmol/L (ref 98–111)
Creatinine, Ser: 0.41 mg/dL — ABNORMAL LOW (ref 0.44–1.00)
GFR, Estimated: >60 mL/min (ref >60)
Glucose, Bld: 94 mg/dL (ref 70–99)
Potassium: 3.7 mmol/L (ref 3.5–5.1)
Sodium: 138 mmol/L (ref 135–145)

## 2023-03-04 LAB — D-DIMER, QUANTITATIVE: D-Dimer, Quant: <0.27 ug{FEU}/mL (ref 0.00–0.50)

## 2023-03-04 LAB — TROPONIN I (HIGH SENSITIVITY): Troponin I (High Sensitivity): <2 ng/L (ref <18)

## 2023-03-04 NOTE — ED Provider Notes (Signed)
[facility] Provider Note   Event Date/Time   First MD Initiated Contact with Patient 03/04/23 1320     (approximate) History  Chest Pain  HPI Kristen Grant is a 28 y.o. female with a stated past medical history of strokes currently on aspirin who presents complaining of left upper chest wall pain that is worse with movement.  Patient also endorses shortness of breath on exertion as this pain is started.  Patient states that similar symptoms have happened in the past when she has had anxiety.  Patient does state that the symptoms have improved. ROS: Patient currently denies any vision changes, tinnitus, difficulty speaking, facial droop, sore throat, abdominal pain, nausea/vomiting/diarrhea, dysuria, or weakness/numbness/paresthesias in any extremity   Physical Exam  Triage Vital Signs: ED Triage Vitals  Encounter Vitals Group     BP 03/04/23 1053 126/87     Systolic BP Percentile --      Diastolic BP Percentile --      Pulse Rate 03/04/23 1053 60     Resp 03/04/23 1053 16     Temp 03/04/23 1053 98.2 F (36.8 C)     Temp Source 03/04/23 1053 Oral     SpO2 03/04/23 1053 100 %     Weight 03/04/23 1054 187 lb (84.8 kg)     Height 03/04/23 1054 5\' 9"  (1.753 m)     Head Circumference --      Peak Flow --      Pain Score 03/04/23 1054 2     Pain Loc --      Pain Education --      Exclude from Growth Chart --    Most recent vital signs: Vitals:   03/04/23 1053 03/04/23 1346  BP: 126/87   Pulse: 60 60  Resp: 16   Temp: 98.2 F (36.8 C)   SpO2: 100% 100%   General: Awake, oriented x4. CV:  Good peripheral perfusion.  Resp:  Normal effort.  Abd:  No distention.  Other:  Young adult overweight Caucasian female resting comfortably in no acute distress.  Patient ambulatory without any significant chest pain or shortness of breath ED Results / Procedures / Treatments  Labs (all labs ordered are listed, but only abnormal results are displayed) Labs  Reviewed  BASIC METABOLIC PANEL - Abnormal; Notable for the following components:      Result Value   CO2 21 (*)    Creatinine, Ser 0.41 (*)    All other components within normal limits  CBC  D-DIMER, QUANTITATIVE  POC URINE PREG, ED  TROPONIN I (HIGH SENSITIVITY)  TROPONIN I (HIGH SENSITIVITY)   EKG ED ECG REPORT I, Merwyn Katos, the attending physician, personally viewed and interpreted this ECG. Date: 03/04/2023 EKG Time: 1057 Rate: 58 Rhythm: normal sinus rhythm QRS Axis: normal Intervals: normal ST/T Wave abnormalities: normal Narrative Interpretation: no evidence of acute ischemia RADIOLOGY ED MD interpretation: 2 view chest x-ray interpreted by me shows no evidence of acute abnormalities including no pneumonia, pneumothorax, or widened mediastinum -Agree with radiology assessment Official radiology report(s): DG Chest 2 View Result Date: 03/04/2023 CLINICAL DATA:  Chest pain. EXAM: CHEST - 2 VIEW COMPARISON:  11/13/2020. FINDINGS: Bilateral lung fields are clear. Bilateral costophrenic angles are clear. Normal cardio-mediastinal silhouette. Probable PFO (patent foramen ovale) closure device noted. No acute osseous abnormalities. The soft tissues are within normal limits. IMPRESSION: No active cardiopulmonary disease. Electronically Signed   By: Jules Schick M.D.   On: 03/04/2023 11:49  PROCEDURES: Critical Care performed: No Procedures MEDICATIONS ORDERED IN ED: Medications - No data to display IMPRESSION / MDM / ASSESSMENT AND PLAN / ED COURSE  I reviewed the triage vital signs and the nursing notes.                             The patient is on the cardiac monitor to evaluate for evidence of arrhythmia and/or significant heart rate changes. Patient's presentation is most consistent with acute presentation with potential threat to life or bodily function. This patient presents with atypical chest pain, most likely secondary to musculoskeletal injury versus  anxiety. Differential diagnosis includes rib fracture, costochondritis, sternal fracture. Low suspicion for ACS, acute PE (PERC negative), pericarditis / myocarditis, thoracic aortic dissection, pneumothorax, pneumonia or other acute infectious process. Presentation not consistent with other acute, emergent causes of chest pain at this time. No indication for cardiac enzyme testing. Plan to order CXR to evaluate for acute cardiopulmonary causes.  Plan: EKG, CXR, pain control Patient is no chest pain or shortness of breath at time of discharge Dispo: Discharge home with home care   FINAL CLINICAL IMPRESSION(S) / ED DIAGNOSES   Final diagnoses:  Atypical chest pain   Rx / DC Orders   ED Discharge Orders          Ordered    Ambulatory Referral to Primary Care (Establish Care)        03/04/23 1332           Note:  This document was prepared using Dragon voice recognition software and may include unintentional dictation errors.   Merwyn Katos, MD 03/04/23 [phone]

## 2023-03-04 NOTE — ED Triage Notes (Signed)
Patient c/o left sided chest pressure that started this AM.  Reports history of anxiety

## 2023-03-04 NOTE — ED Notes (Signed)
Pt with CP describes CP and sob prior to arrival that felt like anxiety, no further CP or sob.

## 2023-03-17 ENCOUNTER — Ambulatory Visit (LOCAL_COMMUNITY_HEALTH_CENTER): Payer: Managed Care, Other (non HMO)

## 2023-03-17 VITALS — BP 110/57 | Ht 69.0 in | Wt 186.5 lb

## 2023-03-17 DIAGNOSIS — Z3009 Encounter for other general counseling and advice on contraception: Secondary | ICD-10-CM | POA: Diagnosis not present

## 2023-03-17 DIAGNOSIS — Z3201 Encounter for pregnancy test, result positive: Secondary | ICD-10-CM

## 2023-03-17 LAB — PREGNANCY, URINE: Preg Test, Ur: POSITIVE — AB

## 2023-03-17 MED ORDER — PRENATAL 27-0.8 MG PO TABS
1.0000 | ORAL_TABLET | Freq: Every day | ORAL | Status: DC
Start: 2023-03-17 — End: 2023-05-17

## 2023-03-17 NOTE — Progress Notes (Signed)
UPT positive. Plans prenatal care at [facility]. Positive preg packet given and reviewed.  Hx stroke 2022. Takes Lipitor, Topamax, ASA daily. Has f-u with neurologist in 2 weeks.   Consult Dr Lorrin Mais who advises patient to stop Lipitor today and to contact prescribing providers for the other meds she takes for medication guidance.Topamax not recommended for preg and ASA is typically given after 12 weeks. Advises patient to establish prenatal care ASAP as she would be considered high risk.   Provider recommendations explained to patient and she is in agreement.  PHQ 12 today Patient declines to speak to provider today and states she sees therapist through her insurance at work every other week via zoom. States sessions will end soon and would like to continue counseling elsewhere.  Denies thoughts of harming self/others. Accepts behavioral health resources today and A Marvin's contact info. Asks RN to message A Mariana Kaufman as she has seen her in the past. Patient prefers counselor to message her. RN sent A Mariana Kaufman epic message.   The patient was dispensed prenatal vitamins #100 today per SO Dr Lorrin Mais. I provided counseling today regarding the medication. We discussed the medication, the side effects and when to call clinic. Patient given the opportunity to ask questions. Questions answered.    Sent to clerk for presumptive elig/medicaid preg women. Jerel Shepherd, RN

## 2023-03-17 NOTE — Progress Notes (Signed)
Consulted by RN S. Neva Seat regarding positive pregnancy test in terms of medications this patient is currently on.   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  Lipitor: Patient to stop immediately and consult her prescribing physician for safer options in pregnancy.   Aspirin: We use in pregnancy after 12 weeks, must consult prescribing physician. Could consider Lovenox subcu.   Topamax: Not recommended for pregnancy. Advised to call prescribing physician for advice on how to switch.   Fayette Pho, MD 03/17/23  5:05 PM

## 2023-03-18 ENCOUNTER — Telehealth: Payer: Self-pay | Admitting: Licensed Clinical Social Worker

## 2023-03-18 NOTE — Telephone Encounter (Signed)
-----   Message from Nurse Siobhan G sent at 03/17/2023  1:05 PM EST ----- Regarding: preg test patient Kristen Grant,  I saw this patient for positive UPT. Plans care at [facility]. She asked that I message you and would like you to message her. Says she has talked with you in the past.  She currently see therapist through her insurance at work, but this will be ending soon and she wants to continue elsewhere.  Hx stroke and has hx depression. PHQ 12 today- denies thoughts of harming self/others.   Wanted to send this to you to ask if you could reach out to her. She may just need help with resources in community.  Thanks  [employer]

## 2023-03-18 NOTE — Progress Notes (Signed)
 [facility] [address] [place] [address] [phone]       OFFICE FOLLOW UP NOTE  Ms. Kristen Grant Date of Birth:  June 01, 1995 Medical Record Number:  [id number]    Primary neurologist: Dr. Pearlean Grant Reason for visit: Headaches, hx of stroke    SUBJECTIVE:   CHIEF COMPLAINT:  Chief Complaint  Patient presents with   Follow-up    Pt in room 8. Alone. Here for migraine/stroke follow up. Pt recently found out she is pregnant,has stopped all medications expect prenatal. Pt said she felt the topamax was not helping much, still was having lots of headaches. Pt said headaches come and go, not daily, but most days. Pt was taking Aleve which helped, but only did this once per week due to stroke.    HPI:   Update 03/22/2023 JM: Patient returns for follow-up visit.  Patient previously seen by Dr. Pearlean Grant 5 months ago for worsening headaches.  Was started on topiramate 25 mg twice daily.   Patient reports recently finding out that she was pregnant therefore all medications discontinued except for her prenatal vitamins.  She did report some improvement on topiramate but continued to have headaches about every other day.  Some headaches are associated with photophobia and phonophobia, other headaches can be present upon awakening or in the later evening that is more of a pressure sensation and not associated with photophobia or phonophobia.  She was previously using Aleve as needed but has not used in the past week.  She has her initial prenatal visit today.  Stable from stroke standpoint.  Continue mild left-sided weakness, has been experiencing more left knee pain, no injury, unsure if related to LLE weakness. No new stroke/TIA symptoms.  Aspirin and atorvastatin discontinued in setting of pregnancy. TCD bubble 10/2022 showed right to left medium size shunt with Valsalva. Has not had recent follow-up visit with Dr. Jacinto Grant.     History provided for reference purposes  only  Update 10/07/2022 Dr. Pearlean Grant: She returns for follow-up after last visit a year and a half ago with Kristen Grant nurse practitioner.  Patient states she is doing well from the stroke standpoint without recurrent stroke or TIA symptoms since her initial stroke in September 2022.  She continues to have left hand weakness and left leg stiffness.  Fine motor skills are diminished on the left.  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  She is tolerating aspirin without side effects.  She has not had recent lab work to check lipid and A1c checked for quite some time.  She has noted increased frequency of her headaches for the last 6 months or so.  Headaches occur 4-5 times a week.  She was previously finding relief with Tylenol but there is no longer working she is switched to taking Aleve instead.  She has to take 1 or 2 tablets about 4 to 5 days a week for symptomatic relief.  She describes the headaches mostly pressure-like but at times can be severe 8/10 in severity.  She is able to walk most of the time but occasionally has to sit down.  There is some accompanying nausea and light and sound sensitivity.  She does not have to lie down.  She is unable to name specific triggers except physical activities like sex and stress.  She does complain of some muscle tightness in the back of her neck.  She  does not do any neck stretching exercises.  She did have endovascular PFO closure on 12/10/2020 by Dr. Jacinto Grant but has not had any follow-up TCD bubble study done.   Update 04/21/2021 JM: 28 year old female who returns for stroke follow-up unaccompanied.  Overall doing well since prior visit.  She has since completed therapies reporting residual left hand and toe numbness (especially if gets cold) and mild left hand weakness with occasional twitching/tremors with fatigue and occasional left foot slapping/dragging with  fatigue. She does admit to not routinely doing any exercises as advised by therapies.  She has since returned back to work at [employer] and driving as well as maintaining ADLs and IADLs independently.  Denies new stroke/TIA symptoms.  She underwent PFO closure on 12/10/2020 without complication.  Migraine headaches greatly improved since PFO closure and essentially resolved - only 2 headaches since closure.  She has since stopped topiramate as no longer needed.  She is closely being followed by psychiatry for depression/anxiety currently on Vistaril, mirtazapine and venlafaxine.  Compliant on atorvastatin without side effects. She has since stopped aspirin as she was told this was no longer needed. She did have some mild bruising and cold sensation while taking. Blood pressure today 117/70.  She has since establish care with PCP Kristen Grooms, NP and following with OB/GYN.  No further concerns at this time.   Update 12/03/2020 JM: [name] is being seen for initial hospital stroke follow-up unaccompanied. She has been making excellent recovery.  Continues to work with PT/OT at [facility] 2x weekly. Reports continued mild left arm weakness but more so difficulty with fine motor skills. Also reports decreased activity tolerance and complaints of fatigue - notices LLE dragging at that time. Also c/o right lower back pain. Reports speaking with therapy re: AFO brace but has not heard anything else.  Currently on short-term disability which ends 11/6.  She is looking forward to returning back to work at [employer] but she is unsure if she will be abel to return 11/6 as she is scheduled for PFO closure on 11/8. She has since returned back to driving without difficulty. Able to maintain majority of ADLs and IADLs independently. Headaches well controlled on topiramate 25 mg twice daily but ran out of rx 3-4 days ago and has noticed slight increase in headaches.   Denies new stroke/TIA symptoms.  Reports compliance  on aspirin 81 mg daily but recently ran out of atorvastatin. Denies side effects.  Blood pressure today 110/75. Eval by Dr. Jacinto Grant on 11/29/2020 for PFO closure - she is scheduled for closure on 12/10/2020. Appt scheduled 11/14 with Rodman Pickle, NP to establish care as PCP.  No further concerns at this time    Stroke admission 10/11/2020 Kristen Grant is a 28 y.o. female with PMHx of anxiety, asthma, migraine headaches and on birth control pills who presented on 10/11/2020 with left-sided weakness.  On arrival to ED, slight right gaze deviation, left facial droop, dysarthria and inability to move left side as well as c/o right-sided headache.  Personally reviewed hospitalization pertinent progress notes, lab work and imaging.  Evaluated by Dr. Pearlean Grant.  Stroke work-up revealed right MCA infarct in setting of right M1 occlusion s/p IV TNKase and mechanical thrombectomy with TICI 3 revascularization require repeat thrombectomy 24 hours later for reocclusion initiate pain TICI 2B-2C revascularization, infarct embolic secondary to cryptogenic etiology. MRI brain showed right MCA territory infarcts predominantly involving right caudate, putamen, anterior limb of R IC, insula, operculum and overlying  right frontal lobe as well as a few punctate acute infarcts in the right parietal lobe as well as associated cytotoxic edema with slight (2-18mm) leftward midline shift at the foramen of Monro and probable small amount of hemorrhage in the posterior right frontal lobe/insular region.  EF 60 to 65%.  LDL 117.  A1c 5.0.  Hypercoagulable labs negative.  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 LE Dopplers negative for DVT. TEE noted small PFO.  Placed on aspirin 81 mg daily and atorvastatin 80 mg daily.  UDS positive for THC.  Recommended discontinuing birth control pill and to follow-up with OB/GYN for alternative long-term contraceptive methods.  No prior stroke history.  Therapy eval's recommended CIR  for residual left hemiparesis and dysarthria.      PERTINENT IMAGING  Code Stroke  CT head hypodensity at right anterior temporal lobe.   ASPECTS 10.     CTA head & neck:   1. Thrombus in the distal right M1 segment with near complete occlusion. There is reconstitution of flow in superior M2 division but no inferior M2 division is identified. 2. 11 cc infarct core in the right corona radiata, 75 cc penumbra. CT perfusion : CBF (<30%) Volume: 11mL Perfusion (Tmax>6.0s) volume: 86mL Mismatch Volume: 75mL Infarction Location:Right corona radiata Cerebral angio : report from Dr. Corliss Skains  bilateral common carotid and Lt VA angiograms followed by complete revascularization of occluded LT MCA M1 seg with x pass with contact aspiration and x1 pass with solitaire 3mm x 40 mm retriever and contact aspiration achieving a TICI 3 revascularization. Post CT brain No ICH . 5.MRI BRAIN:   Acute anterior right MCA territory infarct, predominantly involving the right caudate, putamen, anterior limb of the right internal capsule, insula, operculum and overlying right frontal lobe. A few punctate acute infarcts in the right parietal lobe.  Associated cytotoxic edema with slight (2-3 mm) leftward midline shift at the foramen of Monro. Probable small amount of hemorrhage in the posterior right frontal lobe/insular region. 6. 2D Echo LVEF 60-65%, normal LA, poorly visualized interatrial septum  7. 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   Stroke (HCC)     PSH:  Past Surgical History:  Procedure Laterality Date   BUBBLE STUDY  10/17/2020    Procedure: BUBBLE STUDY;  Surgeon: Jake Bathe, MD;  Location: [facility] ENDOSCOPY;  Service: Cardiovascular;;   IR ANGIO INTRA EXTRACRAN SEL COM CAROTID INNOMINATE UNI L MOD SED  10/11/2020   IR ANGIO VERTEBRAL SEL VERTEBRAL UNI L MOD SED  10/11/2020   IR CT HEAD LTD  10/11/2020   IR CT HEAD LTD  10/12/2020   IR PERCUTANEOUS ART THROMBECTOMY/INFUSION INTRACRANIAL INC DIAG ANGIO  10/11/2020   IR PERCUTANEOUS ART THROMBECTOMY/INFUSION INTRACRANIAL INC DIAG ANGIO  10/12/2020   NO PAST SURGERIES     PATENT FORAMEN OVALE(PFO) CLOSURE N/A 12/10/2020   Procedure: PATENT FORAMEN OVALE (PFO) CLOSURE;  Surgeon: Yates Decamp, MD;  Location: [facility] INVASIVE CV LAB;  Service: Cardiovascular;  Laterality: N/A;   RADIOLOGY WITH ANESTHESIA N/A  10/12/2020   Procedure: RADIOLOGY WITH ANESTHESIA;  Surgeon: Radiologist, Medication, MD;  Location: [facility] OR;  Service: Radiology;  Laterality: N/A;   RADIOLOGY WITH ANESTHESIA N/A 10/11/2020   Procedure: IR WITH ANESTHESIA;  Surgeon: Radiologist, Medication, MD;  Location: [facility] OR;  Service: Radiology;  Laterality: N/A;   TEE WITHOUT CARDIOVERSION N/A 10/17/2020   Procedure: TRANSESOPHAGEAL ECHOCARDIOGRAM (TEE);  Surgeon: Jake Bathe, MD;  Location: [facility] ENDOSCOPY;  Service: Cardiovascular;  Laterality: N/A;    Social History:  Social History   Socioeconomic History   Marital status: Single    Spouse name: Not on file   Number of children: 1   Years of education: Not on file   Highest education level: GED or equivalent  Occupational History   Not on file  Tobacco Use   Smoking status: Former    Types: E-cigarettes, Cigars    Start date: 2021   Smokeless tobacco: Never   Tobacco comments:    Per pt, Stopped smoking "black and milds" and vaping 2022  Vaping Use   Vaping status: Former   Quit date: 01/02/2022   Substances: Nicotine, THC   Devices: Patient tring to quit  Substance and Sexual Activity   Alcohol use: Not Currently    Alcohol/week: 1.0 standard drink of alcohol     Types: 1 Cans of beer per week    Comment: last use 02/2023 "wine"   Drug use: Not Currently    Frequency: 3.0 times per week    Types: Marijuana    Comment: stopped mj 02/2023   Sexual activity: Yes    Partners: Male    Birth control/protection: None, Injection    Comment: depo stopped 2022  Other Topics Concern   Not on file  Social History Narrative   Not on file   Social Drivers of Health   Financial Resource Strain: Not on file  Food Insecurity: Not on file  Transportation Needs: Not on file  Physical Activity: Not on file  Stress: Not on file  Social Connections: Not on file  Intimate Partner Violence: Not At Risk (03/17/2023)   Humiliation, Afraid, Rape, and Kick questionnaire    Fear of Current or Ex-Partner: No    Emotionally Abused: No    Physically Abused: No    Sexually Abused: No    Family History:  Family History  Problem Relation Age of Onset   Diabetes Mother    Hypertension Mother    Epilepsy Mother    Anxiety disorder Mother    Depression Mother    Migraines Mother    Diabetes Maternal Grandmother    Stroke Maternal Grandmother    Hypertension Maternal Grandmother    Asthma Half-Brother    Asthma Half-Sister    Breast cancer Neg Hx     Medications:   Current Outpatient Medications on File Prior to Visit  Medication Sig Dispense Refill   Prenatal Vit-Fe Fumarate-FA (MULTIVITAMIN-PRENATAL) 27-0.8 MG TABS tablet Take 1 tablet by mouth daily at 12 noon.     aspirin (ASPIRIN CHILDRENS) 81 MG chewable tablet Chew 1 tablet (81 mg total) by mouth daily. (Patient not taking: Reported on 03/22/2023)     atorvastatin (LIPITOR) 40 MG tablet TAKE 1 TABLET BY MOUTH DAILY (Patient not taking: Reported on 03/22/2023) 30 tablet 1   topiramate (TOPAMAX) 25 MG tablet Take 1 tablet (25 mg total) by mouth 2 (two) times daily. (Patient not taking: Reported on 03/22/2023) 120 tablet 3   No current facility-administered medications on file prior to  visit.    Allergies:   No Known Allergies    OBJECTIVE:  Physical Exam  Vitals:   03/22/23 0732  BP: 111/69  Pulse: 63  Weight: 190 lb (86.2 kg)  Height: 5\' 9"  (1.753 m)   Body mass index is 28.06 kg/m. No results found.  General: well developed, well nourished, very pleasant young Caucasian female, seated, in no evident distress Head: head normocephalic and atraumatic.   Neck: supple with no carotid or supraclavicular bruits Cardiovascular: regular rate and rhythm, no murmurs Musculoskeletal: no deformity Skin:  no rash/petichiae Vascular:  Normal pulses all extremities   Neurologic Exam Mental Status: Awake and fully alert.  Fluent speech and language.  Oriented to place and time. Recent and remote memory intact. Attention span, concentration and fund of knowledge appropriate. Mood and affect appropriate.  Cranial Nerves: Pupils equal, briskly reactive to light. Extraocular movements full without nystagmus. Visual fields full to confrontation. Hearing intact. Facial sensation intact. Left nasolabial fold flattening.  Tongue and palate moves normally and symmetrically.  Motor: Normal bulk and tone. Normal strength in all tested extremity muscles except slight decreased left hand dexterity and left ADF weakness Sensory.: intact to touch , pinprick , position and vibratory sensation.  Coordination: Rapid alternating movements normal in all extremities except slightly decreased left hand. Finger-to-nose and heel-to-shin performed accurately bilaterally.  Orbits right arm over left arm. Gait and Station: Arises from chair without difficulty. Stance is normal. Gait demonstrates normal stride length and balance without use of assistive device with slight out turning of left foot. Tandem walk and heel toe with mild difficulty especially with LLE Reflexes: 1+ and symmetric. Toes downgoing.         ASSESSMENT: Kristen Grant is a 28 y.o. year old female with recent right MCA infarcts in setting of right  M1 occlusion s/p IV TNKase and IR with TICI 3 revascularization on 10/11/2020 and repeat thrombectomy 24 hours later for reocclusion of dominant superior division of the right MCA achieving TICI 2B-2C revascularization on 10/12/2020, infarct embolic of cryptogenic etiology. Vascular risk factors include evidence of PFO s/p closure 12/2020, remote migraine headaches, HLD, prior birth control use and THC use.  Returned 10/2022 with complaints of worsening headaches and started on topiramate, prior hx of headaches which significantly improved after PFO closure. She is currently pregnant with positive pregnancy urine test on 2/12, has initial prenatal visit today.     PLAN:  Headaches: Suspect mixed migrainous and tension type headaches Previously on topiramate but discontinued in setting of pregnancy Discussed limited treatment options for both preventative and rescue during pregnancy.  Would recommend use of magnesium oxide 400 mg daily and vitamin B2 200 mg twice daily.  She is currently on prenatal vitamin, unsure which brand, she will send a message via MyChart so current vitamin dosages can be reviewed and make sure dosage recommendations at that time If headaches persist, can consider other preventative options at that time but did discuss risk versus benefit with other medications and lack of safety data in setting of pregnancy.  She was advised typically headaches can improve during pregnancy and hopefully this will be the case for her Advise use of Tylenol as needed for rescue but no more than 2-3 times per week to avoid rebound headache.  Would not recommend use of Aleve or other NSAID medications during pregnancy but this can be further discussed with her OB/GYN today  R MCA infarcts :  Residual deficit: Mild left hand and foot  weakness. Now with left knee pain, possibly due to gait impairment with ADF weakness.  Referral placed to PT in [place] per patient request Currently off aspirin and  atorvastatin in setting of pregnancy (see below re: Aspirin) Continue close PCP follow-up for aggressive stroke risk factor management  PFO:  s/p repair 12/10/2020.   2D echo 01/2022 no residual shunting.   TCD bubble 10/2022 showed right to left medium size shunt with Valsalva.   Recommend follow-up with cardiology Dr. Jacinto Grant for further evaluation and discuss safety of restarting aspirin, office number provided     Follow-up in 4 to 6 weeks or call earlier if needed    CC:  PCP: Kristen Grooms, NP    I spent 40 minutes of face-to-face and non-face-to-face time with patient.  This included previsit chart review, lab review, study review, order entry, electronic health record documentation, patient education and discussion regarding above diagnoses and treatment plan including long discussion regarding treatment plan in setting of pregnancy and answered all other questions to patient's satisfaction  Ihor Austin, [facility]  [facility] [address] [place], [place] [address]  Phone [phone] Fax [phone] Note: This document was prepared with digital dictation and possible smart phrase technology. Any transcriptional errors that result from this process are unintentional.

## 2023-03-22 ENCOUNTER — Ambulatory Visit (INDEPENDENT_AMBULATORY_CARE_PROVIDER_SITE_OTHER): Payer: Managed Care, Other (non HMO) | Admitting: Adult Health

## 2023-03-22 ENCOUNTER — Encounter: Payer: Self-pay | Admitting: Adult Health

## 2023-03-22 ENCOUNTER — Ambulatory Visit (INDEPENDENT_AMBULATORY_CARE_PROVIDER_SITE_OTHER): Payer: Managed Care, Other (non HMO)

## 2023-03-22 VITALS — BP 111/69 | HR 63 | Ht 69.0 in | Wt 190.0 lb

## 2023-03-22 DIAGNOSIS — G44209 Tension-type headache, unspecified, not intractable: Secondary | ICD-10-CM

## 2023-03-22 DIAGNOSIS — Z3689 Encounter for other specified antenatal screening: Secondary | ICD-10-CM

## 2023-03-22 DIAGNOSIS — Z3687 Encounter for antenatal screening for uncertain dates: Secondary | ICD-10-CM

## 2023-03-22 DIAGNOSIS — M25562 Pain in left knee: Secondary | ICD-10-CM

## 2023-03-22 DIAGNOSIS — Z8673 Personal history of transient ischemic attack (TIA), and cerebral infarction without residual deficits: Secondary | ICD-10-CM | POA: Diagnosis not present

## 2023-03-22 DIAGNOSIS — Z3480 Encounter for supervision of other normal pregnancy, unspecified trimester: Secondary | ICD-10-CM

## 2023-03-22 DIAGNOSIS — O0990 Supervision of high risk pregnancy, unspecified, unspecified trimester: Secondary | ICD-10-CM | POA: Insufficient documentation

## 2023-03-22 DIAGNOSIS — G8194 Hemiplegia, unspecified affecting left nondominant side: Secondary | ICD-10-CM | POA: Diagnosis not present

## 2023-03-22 DIAGNOSIS — G8929 Other chronic pain: Secondary | ICD-10-CM

## 2023-03-22 DIAGNOSIS — G43909 Migraine, unspecified, not intractable, without status migrainosus: Secondary | ICD-10-CM

## 2023-03-22 DIAGNOSIS — Z3A01 Less than 8 weeks gestation of pregnancy: Secondary | ICD-10-CM

## 2023-03-22 NOTE — Patient Instructions (Addendum)
 Your Plan:  Recommend use of magnesium oxide 400 mg daily and vitamin B2 200 mg twice daily for headache prevention. Please look at your current prenatal vitamins to see dosage amounts - you can also send me your prenatal vitamin brand through MyChart and I can further review this   If headaches persist, please let me know but unfortunately, treatment for headaches during pregnancy is relatively limited  Use of Tylenol as needed but do not use more than 2-3 times per week  Please schedule follow-up visit with Dr. Jacinto Halim for PFO follow-up - office number [phone]  Referral placed to sports rehab in [place], [place] - if you are not called by mid next week, please let me know     Follow-up in 4-5 months or call earlier if needed     Thank you for coming to see [place] at [facility]. I hope we have been able to provide you high quality care today.  You may receive a patient satisfaction survey over the next few weeks. We would appreciate your feedback and comments so that we may continue to improve ourselves and the health of our patients.

## 2023-03-22 NOTE — Progress Notes (Signed)
 New OB Intake  I connected with  Kristen Grant on 03/22/23 at 11:15 AM EST by an In Office Visit .  I explained I am completing New OB Intake today. We discussed her EDD of 11/05/23 that is based on LMP of 01/29/23. Pt is G2/P1. I reviewed her allergies, medications, Medical/Surgical/OB history, and appropriate screenings. There are cats in the home :  yes. If yes Indoor. Based on history, this is a/an pregnancy uncomplicated . Her obstetrical history is significant for  none  .  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   History of opioid abuse (HCC) 03/24/2021   History of stroke x2 10/2020 12/16/2020   Depression, major, single episode, moderate (HCC) 12/16/2020   PFO (patent foramen ovale) 12/09/2020   Anemia    Hemiparesis affecting left side as late effect of stroke (HCC)    Leukocytosis    Dyslipidemia    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    Delivery Plans:  Plans to deliver at [facility].  Anatomy US Explained first scheduled [place] will be 04/12/23 weeks. Anatomy US will be done at 20 weeks of gestational age .   Labs Discussed genetic screening with patient. Patient will like genetic testing to be drawn at new  OB visit. Discussed possible labs to be drawn at new OB appointment.  COVID Vaccine Patient has not had COVID vaccine.   Social Determinants of Health Food Insecurity: denies food insecurity WIC Referral: Patient is interested in referral to [facility].  Transportation: Patient denies transportation needs. Childcare: Discussed no children allowed at ultrasound appointments.   First visit review I reviewed new OB appt with pt. I explained she will have blood work and pap smear/pelvic exam if indicated. Explained pt will be seen by Obie Dredge at first visit; encounter routed to appropriate provider.   Cornelius Moras, [facility] 03/22/2023  11:48 AM

## 2023-03-22 NOTE — Patient Instructions (Signed)
 Common Medications Safe in Pregnancy  Acne:      Constipation:  Benzoyl Peroxide     Colace  Clindamycin      Dulcolax Suppository  Topica Erythromycin     Fibercon  Salicylic Acid      Metamucil         Miralax AVOID:        Senakot   Accutane    Cough:  Retin-A       Cough Drops  Tetracycline      Phenergan w/ Codeine if Rx  Minocycline      Robitussin (Plain & DM)  Antibiotics:     Crabs/Lice:  Ceclor       RID  Cephalosporins    AVOID:  E-Mycins      Kwell  Keflex  Macrobid/Macrodantin   Diarrhea:  Penicillin      Kao-Pectate  Zithromax      Imodium AD         PUSH FLUIDS AVOID:       Cipro     Fever:  Tetracycline      Tylenol (Regular or Extra  Minocycline       Strength)  Levaquin      Extra Strength-Do not          Exceed 8 tabs/24 hrs Caffeine:        200mg /day (equiv. To 1 cup of coffee or  approx. 3 12 oz sodas)         Gas: Cold/Hayfever:       Gas-X  Benadryl      Mylicon  Claritin       Phazyme  **Claritin-D        Chlor-Trimeton    Headaches:  Dimetapp      ASA-Free Excedrin  Drixoral-Non-Drowsy     Cold Compress  Mucinex (Guaifenasin)     Tylenol (Regular or Extra  Sudafed/Sudafed-12 Hour     Strength)  **Sudafed PE Pseudoephedrine   Tylenol Cold & Sinus     Vicks Vapor Rub  Zyrtec  **AVOID if Problems With Blood Pressure         Heartburn: Avoid lying down for at least 1 hour after meals  Aciphex      Maalox     Rash:  Milk of Magnesia     Benadryl    Mylanta       1% Hydrocortisone Cream  Pepcid  Pepcid Complete   Sleep Aids:  Prevacid      Ambien   Prilosec       Benadryl  Rolaids       Chamomile Tea  Tums (Limit 4/day)     Unisom         Tylenol PM         Warm milk-add vanilla or  Hemorrhoids:       Sugar for taste  Anusol/Anusol H.C.  (RX: Analapram 2.5%)  Sugar Substitutes:  Hydrocortisone OTC     Ok in moderation  Preparation H      Tucks        Vaseline lotion applied to tissue with  wiping    Herpes:     Throat:  Acyclovir      Oragel  Famvir  Valtrex     Vaccines:         Flu Shot Leg Cramps:       *Gardasil  Benadryl      Hepatitis A         Hepatitis B Nasal Spray:  Pneumovax  Saline Nasal Spray     Polio Booster         Tetanus Nausea:       Tuberculosis test or PPD  Vitamin B6 25 mg TID   AVOID:    Dramamine      *Gardasil  Emetrol       Live Poliovirus  Ginger Root 250 mg QID    MMR (measles, mumps &  High Complex Carbs @ Bedtime    rebella)  Sea Bands-Accupressure    Varicella (Chickenpox)  Unisom 1/2 tab TID     *No known complications           If received before Pain:         Known pregnancy;   Darvocet       Resume series after  Lortab        Delivery  Percocet    Yeast:   Tramadol      Femstat  Tylenol 3      Gyne-lotrimin  Ultram       Monistat  Vicodin           MISC:         All Sunscreens           Hair Coloring/highlights          Insect Repellant's          (Including DEET)         Mystic Tans Morning Sickness Morning sickness is when you throw up or feel like you may throw up during pregnancy. This condition often occurs in the morning, but it can also occur at any time of day. Morning sickness is most common during the first three months of pregnancy, but it can go on throughout the pregnancy. Morning sickness is usually harmless. But if you throw up all the time, you should see your health care provider. You may also hear this condition called nausea and vomiting of pregnancy. What are the causes? The cause of morning sickness is not known. It may be linked to changes in hormones during pregnancy. What increases the risk? You're more likely to have morning sickness if: You had morning sickness in another pregnancy. You're pregnant with more than one baby, such as twins. You had morning sickness in other pregnancies. You have had motion sickness before you were pregnant. You have had bad headaches or migraines before you  were pregnant. What are the signs or symptoms? Symptoms of morning sickness include: Feeling like you may throw up. Throwing up. How is this diagnosed? Morning sickness is diagnosed based on your symptoms. How is this treated? Treatment is usually not needed for morning sickness. You may only need to change what you eat. In some cases, your provider may give you: Vitamin B6 supplements. Medicines to prevent throwing up. Ginger. Follow these instructions at home: Medicines Take your medicines only as told by your provider. Do not use any prescription, over-the-counter, or herbal medicines for morning sickness without first talking with your provider. Take prenatal vitamins. These can stop or lessen the symptoms of morning sickness. If you feel like you may throw up after taking prenatal vitamins, take them at night or with a snack. Eating and drinking     Eat dry toast or crackers before getting out of bed. Eat 5 or 6 small meals a day. Try ginger ale made with real ginger, ginger tea, or ginger candies. Drink fluids throughout the day. Eat protein foods when you need a snack.  Nuts, yogurt, and cheese are good choices. Eat dry and bland foods like rice or baked potatoes. Foods that are high in carbohydrates are often helpful. Have someone cook for you if the smell of food makes you want to throw up. Foods to avoid Greasy foods. Fatty foods. Spicy foods. General instructions Try to avoid smells that make you feel sick. Use an air purifier to keep the air in your house free of smells. Try using an acupressure wristband. This is a wristband that's used to treat motion sickness. Try acupuncture. In this treatment, a provider puts thin needles into certain areas of your body to make you feel better. Brush your teeth after throwing up or rinse with a mix of baking soda and water. The acid in throw-up can hurt your teeth. Contact a health care provider if: Your symptoms do not get  better. You feel dizzy or light-headed. You're losing weight. Get help right away if: The feeling that you may throw up will not go away, or you can't stop throwing up. You faint. You have very bad pain in your belly. This information is not intended to replace advice given to you by your health care provider. Make sure you discuss any questions you have with your health care provider. Document Revised: 10/22/2022 Document Reviewed: 04/30/2022 Elsevier Patient Education  2024 Elsevier Inc.First Trimester of Pregnancy  The first trimester of pregnancy starts on the first day of your last monthly period until the end of week 13. This is months 1 through 3 of pregnancy. A week after a sperm fertilizes an egg, the egg will implant into the wall of the uterus and begin to develop into a baby. Body changes during your first trimester Your body goes through many changes during pregnancy. The changes usually return to normal after your baby is born. Physical changes Your breasts may grow larger and may hurt. The area around your nipples may get darker. Your periods will stop. Your hair and nails may grow faster. You may pee more often. Health changes You may tire easily. Your gums may bleed and may be sensitive when you brush and floss. You may not feel hungry. You may have heartburn. You may throw up or feel like you may throw up. You may want to eat some foods, but not others. You may have headaches. You may have trouble pooping (constipation). Other changes Your emotions may change from day to day. You may have more dreams. Follow these instructions at home: Medicines Talk to your health care provider if you're taking medicines. Ask if the medicines are safe to take during pregnancy. Your provider may change the medicines that you take. Do not take any medicines unless told to by your provider. Take a prenatal vitamin that has at least 600 micrograms (mcg) of folic acid. Do not use  herbal medicines, illegal substances, or medicines that are not approved by your provider. Eating and drinking While you're pregnant your body needs extra food for your growing baby. Talk with your provider about what to eat while pregnant. Activity Most women are able to exercise during pregnancy. Exercises may need to change as your pregnancy goes on. Talk to your provider about your activities and exercise routines. Relieving pain and discomfort Wear a good, supportive bra if your breasts hurt. Rest with your legs raised if you have leg cramps or low back pain. Safety Wear your seatbelt at all times when you're in a car. Talk to your provider if someone hits you,  hurts you, or yells at you. Talk with your provider if you're feeling sad or have thoughts of hurting yourself. Lifestyle Certain things can be harmful while you're pregnant. Follow these rules: Do not use hot tubs, steam rooms, or saunas. Do not douche. Do not use tampons or scented pads. Do not drink alcohol,smoke, vape, or use products with nicotine or tobacco in them. If you need help quitting, talk with your provider. Avoid cat litter boxes and soil used by cats. These things carry germs that can cause harm to your pregnancy and your baby. General instructions Keep all follow-up visits. It helps you and your unborn baby stay as healthy as possible. Write down your questions. Take them to your visits. Your provider will: Talk with you about your overall health. Give you advice or refer you to specialists who can help with different needs, including: Prenatal education classes. Mental health and counseling. Foods and healthy eating. Ask for help if you need help with food. Call your dentist and ask to be seen. Brush your teeth with a soft toothbrush. Floss gently. Where to find more information American Pregnancy Association: americanpregnancy.org [employer]: acog.org Office on  [employer] Health: [employer] Contact a health care provider if: You feel dizzy, faint, or have a fever. You vomit or have watery poop (diarrhea) for 2 days or more. You have abnormal discharge or bleeding from your vagina. You have pain when you pee or your pee smells bad. You have cramps, pain, or pressure in your belly area. Get help right away if: You have trouble breathing or chest pain. You have any kind of injury, such as from a fall or a car crash. These symptoms may be an emergency. Get help right away. Call 911. Do not wait to see if the symptoms will go away. Do not drive yourself to the hospital. This information is not intended to replace advice given to you by your health care provider. Make sure you discuss any questions you have with your health care provider. Document Revised: 10/22/2022 Document Reviewed: 05/22/2022 Elsevier Patient Education  2024 [employer].

## 2023-03-25 ENCOUNTER — Ambulatory Visit: Payer: Managed Care, Other (non HMO) | Admitting: Licensed Clinical Social Worker

## 2023-04-08 ENCOUNTER — Encounter: Payer: Self-pay | Admitting: Advanced Practice Midwife

## 2023-04-08 ENCOUNTER — Other Ambulatory Visit: Payer: Self-pay

## 2023-04-08 ENCOUNTER — Encounter: Payer: Self-pay | Admitting: Emergency Medicine

## 2023-04-08 ENCOUNTER — Telehealth: Payer: Self-pay

## 2023-04-08 ENCOUNTER — Emergency Department

## 2023-04-08 ENCOUNTER — Emergency Department
Admission: EM | Admit: 2023-04-08 | Discharge: 2023-04-08 | Disposition: A | Discharge status: Home / Self Care | Attending: Emergency Medicine | Admitting: Emergency Medicine

## 2023-04-08 DIAGNOSIS — O039 Complete or unspecified spontaneous abortion without complication: Secondary | ICD-10-CM | POA: Diagnosis not present

## 2023-04-08 DIAGNOSIS — E876 Hypokalemia: Secondary | ICD-10-CM | POA: Insufficient documentation

## 2023-04-08 DIAGNOSIS — N939 Abnormal uterine and vaginal bleeding, unspecified: Secondary | ICD-10-CM | POA: Diagnosis present

## 2023-04-08 LAB — URINALYSIS, ROUTINE W REFLEX MICROSCOPIC
Bacteria, UA: NONE SEEN
Bilirubin Urine: NEGATIVE
Glucose, UA: NEGATIVE mg/dL
Ketones, ur: NEGATIVE mg/dL
Leukocytes,Ua: NEGATIVE
Nitrite: NEGATIVE
Protein, ur: NEGATIVE mg/dL
Specific Gravity, Urine: 1.005 (ref 1.005–1.030)
pH: 7 (ref 5.0–8.0)

## 2023-04-08 LAB — CBC
HCT: 36.1 % (ref 36.0–46.0)
Hemoglobin: 12.4 g/dL (ref 12.0–15.0)
MCH: 31.8 pg (ref 26.0–34.0)
MCHC: 34.3 g/dL (ref 30.0–36.0)
MCV: 92.6 fL (ref 80.0–100.0)
Platelets: 264 10*3/uL (ref 150–400)
RBC: 3.9 MIL/uL (ref 3.87–5.11)
RDW: 11.5 % (ref 11.5–15.5)
WBC: 7.2 10*3/uL (ref 4.0–10.5)
nRBC: 0 % (ref 0.0–0.2)

## 2023-04-08 LAB — BASIC METABOLIC PANEL
Anion gap: 12 (ref 5–15)
BUN: 11 mg/dL (ref 6–20)
CO2: 25 mmol/L (ref 22–32)
Calcium: 9.3 mg/dL (ref 8.9–10.3)
Chloride: 101 mmol/L (ref 98–111)
Creatinine, Ser: 0.48 mg/dL (ref 0.44–1.00)
GFR, Estimated: >60 mL/min (ref >60)
Glucose, Bld: 80 mg/dL (ref 70–99)
Potassium: 3.4 mmol/L — ABNORMAL LOW (ref 3.5–5.1)
Sodium: 138 mmol/L (ref 135–145)

## 2023-04-08 LAB — HCG, QUANTITATIVE, PREGNANCY: hCG, Beta Chain, Quant, S: 41103 m[IU]/mL — ABNORMAL HIGH (ref <5)

## 2023-04-08 MED ORDER — SODIUM CHLORIDE 0.9 % IV BOLUS
500.0000 mL | Freq: Once | INTRAVENOUS | Status: AC
  Administered 2023-04-08: 500 mL via INTRAVENOUS

## 2023-04-08 MED ORDER — OXYCODONE-ACETAMINOPHEN 7.5-325 MG PO TABS: 1.0000 | ORAL_TABLET | ORAL | 0 refills | Status: DC | PRN

## 2023-04-08 NOTE — Telephone Encounter (Signed)
 Patient is scheduled to see you for her NOB Physical. She called with concerns of bleeding. She has some spotting with clots when she wipes. I advised patient to continue to monitor bleeding and if she started soaking a pad every 30 minutes to an hour to follow back up with [place] or go to L&D for evaluation. She denies in cramping or abdominal pain.

## 2023-04-08 NOTE — ED Triage Notes (Signed)
 Pt via POV from home. Pt c/o vaginal bleeding that started today and abd cramping. Reports that she is approx [redacted] weeks pregnant. LMP 12/27. Pt does have a hx of stroke but not currently taking any blood thinners at this time. Pt is A&Ox4 and NAD

## 2023-04-08 NOTE — Discharge Instructions (Signed)
 You have been diagnosed with failed pregnancy.  You can make an appointment and see see your OB/GYN.  Please take Percocet for pain every 6 hours as needed for pain.  Come back to ED or go to your PCP if you have new symptoms or symptoms worsen

## 2023-04-08 NOTE — ED Provider Notes (Signed)
 [facility] Provider Note    Event Date/Time   First MD Initiated Contact with Patient 04/08/23 1555     (approximate)   History   Vaginal Bleeding   HPI  Kristen Grant is a 28 y.o. female who presents today with history of left lower quadrant pain, vaginal bleeding that is started today.  Patient states she is pregnant [redacted] weeks, she is G2 P1.  Patient states having an episode of nauseas, vomit, diarrhea last Sunday and being unable to eat or drink.  Today patient started tolerating p.o. intake of fluids.  Patient denies fever, cough, dysuria, frequency.      Physical Exam   Triage Vital Signs: ED Triage Vitals  Encounter Vitals Group     BP 04/08/23 1515 108/69     Systolic BP Percentile --      Diastolic BP Percentile --      Pulse Rate 04/08/23 1515 74     Resp 04/08/23 1515 18     Temp 04/08/23 1515 98.4 F (36.9 C)     Temp src --      SpO2 04/08/23 1515 99 %     Weight 04/08/23 1513 183 lb (83 kg)     Height 04/08/23 1513 5\' 9"  (1.753 m)     Head Circumference --      Peak Flow --      Pain Score 04/08/23 1513 1     Pain Loc --      Pain Education --      Exclude from Growth Chart --     Most recent vital signs: Vitals:   04/08/23 1515  BP: 108/69  Pulse: 74  Resp: 18  Temp: 98.4 F (36.9 C)  SpO2: 99%     Constitutional: Alert, NAD. Able to speak in complete sentences without cough or dyspnea  Eyes: Conjunctivae are normal.  Head: Atraumatic. Nose: No congestion/rhinnorhea. Mouth/Throat: Mucous membranes are moist.   Neck: Painless ROM. Supple. No JVD, nodes, thyromegaly  Cardiovascular:   Good peripheral circulation.RRR no murmurs, gallops, rubs  Respiratory: Normal respiratory effort.  No retractions. Clear to auscultation bilaterally without wheezing or crackles  Gastrointestinal: Skin is intact, bowel sounds positive, McBurney point negative, Rovsing negative, rebound negative, tender to palpation in left lower  quadrant.   Musculoskeletal:  no deformity Neurologic:  MAE spontaneously. No gross focal neurologic deficits are appreciated.  Skin:  Skin is warm, dry and intact. No rash noted. Psychiatric: Mood and affect are normal. Speech and behavior are normal.    ED Results / Procedures / Treatments   Labs (all labs ordered are listed, but only abnormal results are displayed) Labs Reviewed  BASIC METABOLIC PANEL - Abnormal; Notable for the following components:      Result Value   Potassium 3.4 (*)    All other components within normal limits  HCG, QUANTITATIVE, PREGNANCY - Abnormal; Notable for the following components:   hCG, Beta Chain, Quant, S 41,103 (*)    All other components within normal limits  URINALYSIS, ROUTINE W REFLEX MICROSCOPIC - Abnormal; Notable for the following components:   Color, Urine STRAW (*)    APPearance CLEAR (*)    Hgb urine dipstick MODERATE (*)    All other components within normal limits  CBC  POC URINE PREG, ED  ABO/RH     EKG  RADIOLOGY I independently reviewed and interpreted imaging and agree with radiologists findings.      PROCEDURES:  Critical Care performed:  Procedures   MEDICATIONS ORDERED IN ED: Medications  sodium chloride 0.9 % bolus 500 mL (0 mLs Intravenous Stopped 04/08/23 2002)   Clinical Course as of 04/08/23 2051  Thu Apr 08, 2023  1630 CBC White blood cells, hemoglobin within normal limits [AE]  1630 hCG, quantitative, pregnancy(!) H CG 41.103 [AE]  1630 Urinalysis, Routine w reflex microscopic -Urine, Clean Catch(!) Hemoglobin moderate [AE]  1630 Basic metabolic panel(!) Hypokalemia 3.4, sodium, renal function, anion gap within normal limits [AE]  2044 US OB LESS THAN 14 WEEKS WITH OB TRANSVAGINAL Findings meet definitive criteria for failed pregnancy [AE]    Clinical Course User Index [AE] Gladys Damme, PA-C    IMPRESSION / MDM / ASSESSMENT AND PLAN / ED COURSE  I reviewed the triage vital signs and  the nursing notes.  Differential diagnosis includes, but is not limited to, first trimester bleeding, UTI, ectopic pregnancy  Patient's presentation is most consistent with acute complicated illness / injury requiring diagnostic workup.   Patient's diagnosis is consistent with Failed pregnancy, dehydration. I independently reviewed and interpreted imaging and agree with radiologists findings. Labs are  reassuring.  During admission patient received IV fluids. I did review the patient's allergies and medications.The patient is in stable and satisfactory condition for discharge home  Patient will be discharged home with prescriptions for oxycodone. Patient is to follow up with OB/GYN as needed or otherwise directed. Patient is given ED precautions to return to the ED for any worsening or new symptoms. Discussed plan of care with patient, answered all of patient's questions, Patient agreeable to plan of care. Advised patient to take medications according to the instructions on the label. Discussed possible side effects of new medications. Patient verbalized understanding.    FINAL CLINICAL IMPRESSION(S) / ED DIAGNOSES   Final diagnoses:  Fetal demise due to miscarriage     Rx / DC Orders   ED Discharge Orders          Ordered    oxyCODONE-acetaminophen (PERCOCET) 7.5-325 MG tablet  Every 4 hours PRN        04/08/23 2051             Note:  This document was prepared using Dragon voice recognition software and may include unintentional dictation errors.   Gladys Damme, PA-C 04/08/23 2051    Concha Se, MD 04/08/23 2240

## 2023-04-08 NOTE — ED Notes (Signed)
 See triage note States she is approx [redacted] weeks pregnant   Developed some abd cramping and some vaginal bleeding today Has not passed any clots

## 2023-04-09 ENCOUNTER — Ambulatory Visit: Payer: Managed Care, Other (non HMO) | Attending: Cardiology | Admitting: Cardiology

## 2023-04-09 ENCOUNTER — Encounter: Payer: Self-pay | Admitting: Cardiology

## 2023-04-09 VITALS — BP 122/64 | HR 67 | Resp 16 | Ht 69.0 in | Wt 183.2 lb

## 2023-04-09 DIAGNOSIS — Q211 Atrial septal defect, unspecified: Secondary | ICD-10-CM

## 2023-04-09 DIAGNOSIS — I688 Other cerebrovascular disorders in diseases classified elsewhere: Secondary | ICD-10-CM

## 2023-04-09 DIAGNOSIS — Z8774 Personal history of (corrected) congenital malformations of heart and circulatory system: Secondary | ICD-10-CM

## 2023-04-09 DIAGNOSIS — I749 Embolism and thrombosis of unspecified artery: Secondary | ICD-10-CM

## 2023-04-09 NOTE — H&P (View-Only) (Signed)
 Cardiology Office Note:  .   Date:  04/10/2023  ID:  Kristen Grant, DOB 1995-09-18, MRN [id number] PCP: Yates Decamp, MD  [facility] Providers Cardiologist:  None   History of Present Illness: .   Kristen Grant is a 28 y.o. Caucasian female patient with no significant prior cardiovascular history, patient was admitted to the hospital on 10/11/2020 with acute stroke, she had right distal M1 segment near complete occlusion for which she mechanical thrombectomy, but due to recurrence of strokelike symptoms, underwent repeat percutaneous thrombectomy on 10/21/2020.  She was also found to have a moderate to large sized PFO by transcranial bubble study and a PFO by TEE.  Underwent repair of the PFO on 12/10/2020   Discussed the use of AI scribe software for clinical note transcription with the patient, who gave verbal consent to proceed.  History of Present Illness   The patient, a 28 year old who recently experienced a miscarriage at [redacted] weeks gestation, presents for follow-up after a bubble study performed by her neurologist. The study, conducted in September of the previous year, revealed a possible residual defect following a device closure of PFO. The patient reports no specific symptoms related to this finding. She has been referred back for further evaluation and possible intervention.      Labs   Lab Results  Component Value Date   CHOL 181 10/07/2022   HDL 52 10/07/2022   LDLCALC 112 (H) 10/07/2022   TRIG 94 10/07/2022   CHOLHDL 3.5 10/07/2022   Lab Results  Component Value Date   NA 138 04/08/2023   K 3.4 (L) 04/08/2023   CO2 25 04/08/2023   GLUCOSE 80 04/08/2023   BUN 11 04/08/2023   CREATININE 0.48 04/08/2023   CALCIUM 9.3 04/08/2023   EGFR 128 12/06/2020   GFRNONAA >60 04/08/2023      Latest Ref Rng & Units 04/08/2023    3:17 PM 03/04/2023   10:57 AM 07/02/2022    4:12 PM  BMP  Glucose 70 - 99 mg/dL 80  94  409   BUN 6 - 20 mg/dL 11  7  7    Creatinine 0.44 - 1.00  mg/dL 8.11  9.14  7.82   Sodium 135 - 145 mmol/L 138  138  138   Potassium 3.5 - 5.1 mmol/L 3.4  3.7  3.9   Chloride 98 - 111 mmol/L 101  109  101   CO2 22 - 32 mmol/L 25  21    Calcium 8.9 - 10.3 mg/dL 9.3  9.2        Latest Ref Rng & Units 04/08/2023    3:17 PM 03/04/2023   10:57 AM 07/02/2022    4:12 PM  CBC  WBC 4.0 - 10.5 K/uL 7.2  6.3    Hemoglobin 12.0 - 15.0 g/dL 95.6  21.3  08.6   Hematocrit 36.0 - 46.0 % 36.1  40.7  38.0   Platelets 150 - 400 K/uL 264  251     Lab Results  Component Value Date   HGBA1C 5.0 10/07/2022    Lab Results  Component Value Date   TSH 0.821 01/13/2021   Review of Systems  Cardiovascular:  Negative for chest pain, dyspnea on exertion and leg swelling.   Physical Exam:   VS:  BP 122/64 (BP Location: Left Arm, Patient Position: Sitting, Cuff Size: Large)   Pulse 67   Resp 16   Ht 5\' 9"  (1.753 m)   Wt 183 lb 3.2 oz (83.1  kg)   LMP 01/29/2023 (Approximate)   SpO2 98%   BMI 27.05 kg/m    Wt Readings from Last 3 Encounters:  04/09/23 183 lb 3.2 oz (83.1 kg)  04/08/23 183 lb (83 kg)  03/22/23 190 lb (86.2 kg)    Physical Exam Neck:     Vascular: No carotid bruit or JVD.  Cardiovascular:     Rate and Rhythm: Normal rate and regular rhythm.     Pulses: Intact distal pulses.     Heart sounds: Normal heart sounds. No murmur heard.    No gallop.  Pulmonary:     Effort: Pulmonary effort is normal.     Breath sounds: Normal breath sounds.  Abdominal:     General: Bowel sounds are normal.     Palpations: Abdomen is soft.  Musculoskeletal:     Right lower leg: No edema.     Left lower leg: No edema.    Studies Reviewed: Marland Kitchen    Intracardiac echo guided closure of the patent foramen ovale on 12/10/2020 with implantation of a 25 mm Amplatzer Talisman PFO occluder.   Recommendation: Aspirin indefinitely, Plavix 3 months, endocarditis prophylaxis 6 months.  Patient to be discharged home today.   Echocardiogram 01/06/2022:  Normal LV systolic  function with EF 66%. Left ventricle cavity is normal in size. Normal left ventricular wall thickness. Normal global wall motion. Calculated EF 66%. Left atrial cavity is normal in size. There is a Amplatzer ASO device (25 mm Amplatzer PFO occluder )  noted in the fossa ovalis.  Appears to be in good position.  No thrombus.  There  was suggestion of possible residual shunting across the interatrial septum however double contrast study at rest and with Valsalva reveals no residual shunting. Compared to the study done on 12/10/2020, no significant change.  TCD bubble study 10/28/2022: No HITS at rest. Partial curtain noted during Valsalva. Positive TCD  bubble study indicative of right to left medium sized shunt with Valsalva.  EKG:    EKG 03/04/2023: Normal sinus rhythm at the rate of 58 bpm, normal EKG.  Medications and allergies    No Known Allergies   Current Outpatient Medications:    oxyCODONE-acetaminophen (PERCOCET) 7.5-325 MG tablet, Take 1 tablet by mouth every 4 (four) hours as needed for severe pain (pain score 7-10)., Disp: 20 tablet, Rfl: 0   Prenatal Vit-Fe Fumarate-FA (MULTIVITAMIN-PRENATAL) 27-0.8 MG TABS tablet, Take 1 tablet by mouth daily at 12 noon. (Patient not taking: Reported on 04/09/2023), Disp: , Rfl:    ASSESSMENT AND PLAN: .      ICD-10-CM   1. 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 History of repair of congenital atrial septal defect (ASD)  Z87.74     3. 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 There is a risk of paradoxical embolism and stroke due to the residual shunt, necessitating further evaluation and  potential closure, especially given her young age of 89. Schedule a transesophageal echocardiogram (TEE) to assess the presence and size of the residual PFO. If confirmed, plan for closure with a different device to ensure complete sealing. Discuss the risks of the procedure, including less  than 1% risk of stroke, potential throat discomfort, and the very rare risk of esophageal perforation.  Pregnancy   Provided emotional support and reassurance regarding pregnancy and weight management, especially in light of the recent loss of a previous pregnancy.           Signed,  Yates Decamp, MD, [facility] 04/10/2023, 3:50 PM [facility] [address] [place], [place] [address] Phone: [phone]. Fax:  [phone]

## 2023-04-09 NOTE — Patient Instructions (Signed)
 Medication Instructions:  Your physician recommends that you continue on your current medications as directed. Please refer to the Current Medication list given to you today. *If you need a refill on your cardiac medications before your next appointment, please call your pharmacy*   Testing/Procedures: Transesophageal Echocardiogram Your physician has requested that you have a TEE. During a TEE, sound waves are used to create images of your heart. It provides your doctor with information about the size and shape of your heart and how well your heart's chambers and valves are working. In this test, a transducer is attached to the end of a flexible tube that's guided down your throat and into your esophagus (the tube leading from you mouth to your stomach) to get a more detailed image of your heart. You are not awake for the procedure. Please see the instruction sheet given to you today. For further information please visit [email or web address]     Follow-Up: At [facility], you and your health needs are our priority.  As part of our continuing mission to provide you with exceptional heart care, we have created designated Provider Care Teams.  These Care Teams include your primary Cardiologist (physician) and Advanced Practice Providers (APPs -  Physician Assistants and Nurse Practitioners) who all work together to provide you with the care you need, when you need it.  We recommend signing up for the patient portal called "MyChart".  Sign up information is provided on this After Visit Summary.  MyChart is used to connect with patients for Virtual Visits (Telemedicine).  Patients are able to view lab/test results, encounter notes, upcoming appointments, etc.  Non-urgent messages can be sent to your provider as well.   To learn more about what you can do with MyChart, go to [email or web address].    Your next appointment:   3 month(s)  Provider:   Dr Jacinto Halim

## 2023-04-09 NOTE — Progress Notes (Signed)
 Cardiology Office Note:  .   Date:  04/10/2023  ID:  Kristen Grant, DOB 1995-09-18, MRN [id number] PCP: Yates Decamp, MD  [facility] Providers Cardiologist:  None   History of Present Illness: .   Kristen Grant is a 28 y.o. Caucasian female patient with no significant prior cardiovascular history, patient was admitted to the hospital on 10/11/2020 with acute stroke, she had right distal M1 segment near complete occlusion for which she mechanical thrombectomy, but due to recurrence of strokelike symptoms, underwent repeat percutaneous thrombectomy on 10/21/2020.  She was also found to have a moderate to large sized PFO by transcranial bubble study and a PFO by TEE.  Underwent repair of the PFO on 12/10/2020   Discussed the use of AI scribe software for clinical note transcription with the patient, who gave verbal consent to proceed.  History of Present Illness   The patient, a 28 year old who recently experienced a miscarriage at [redacted] weeks gestation, presents for follow-up after a bubble study performed by her neurologist. The study, conducted in September of the previous year, revealed a possible residual defect following a device closure of PFO. The patient reports no specific symptoms related to this finding. She has been referred back for further evaluation and possible intervention.      Labs   Lab Results  Component Value Date   CHOL 181 10/07/2022   HDL 52 10/07/2022   LDLCALC 112 (H) 10/07/2022   TRIG 94 10/07/2022   CHOLHDL 3.5 10/07/2022   Lab Results  Component Value Date   NA 138 04/08/2023   K 3.4 (L) 04/08/2023   CO2 25 04/08/2023   GLUCOSE 80 04/08/2023   BUN 11 04/08/2023   CREATININE 0.48 04/08/2023   CALCIUM 9.3 04/08/2023   EGFR 128 12/06/2020   GFRNONAA >60 04/08/2023      Latest Ref Rng & Units 04/08/2023    3:17 PM 03/04/2023   10:57 AM 07/02/2022    4:12 PM  BMP  Glucose 70 - 99 mg/dL 80  94  409   BUN 6 - 20 mg/dL 11  7  7    Creatinine 0.44 - 1.00  mg/dL 8.11  9.14  7.82   Sodium 135 - 145 mmol/L 138  138  138   Potassium 3.5 - 5.1 mmol/L 3.4  3.7  3.9   Chloride 98 - 111 mmol/L 101  109  101   CO2 22 - 32 mmol/L 25  21    Calcium 8.9 - 10.3 mg/dL 9.3  9.2        Latest Ref Rng & Units 04/08/2023    3:17 PM 03/04/2023   10:57 AM 07/02/2022    4:12 PM  CBC  WBC 4.0 - 10.5 K/uL 7.2  6.3    Hemoglobin 12.0 - 15.0 g/dL 95.6  21.3  08.6   Hematocrit 36.0 - 46.0 % 36.1  40.7  38.0   Platelets 150 - 400 K/uL 264  251     Lab Results  Component Value Date   HGBA1C 5.0 10/07/2022    Lab Results  Component Value Date   TSH 0.821 01/13/2021   Review of Systems  Cardiovascular:  Negative for chest pain, dyspnea on exertion and leg swelling.   Physical Exam:   VS:  BP 122/64 (BP Location: Left Arm, Patient Position: Sitting, Cuff Size: Large)   Pulse 67   Resp 16   Ht 5\' 9"  (1.753 m)   Wt 183 lb 3.2 oz (83.1  kg)   LMP 01/29/2023 (Approximate)   SpO2 98%   BMI 27.05 kg/m    Wt Readings from Last 3 Encounters:  04/09/23 183 lb 3.2 oz (83.1 kg)  04/08/23 183 lb (83 kg)  03/22/23 190 lb (86.2 kg)    Physical Exam Neck:     Vascular: No carotid bruit or JVD.  Cardiovascular:     Rate and Rhythm: Normal rate and regular rhythm.     Pulses: Intact distal pulses.     Heart sounds: Normal heart sounds. No murmur heard.    No gallop.  Pulmonary:     Effort: Pulmonary effort is normal.     Breath sounds: Normal breath sounds.  Abdominal:     General: Bowel sounds are normal.     Palpations: Abdomen is soft.  Musculoskeletal:     Right lower leg: No edema.     Left lower leg: No edema.    Studies Reviewed: Marland Kitchen    Intracardiac echo guided closure of the patent foramen ovale on 12/10/2020 with implantation of a 25 mm Amplatzer Talisman PFO occluder.   Recommendation: Aspirin indefinitely, Plavix 3 months, endocarditis prophylaxis 6 months.  Patient to be discharged home today.   Echocardiogram 01/06/2022:  Normal LV systolic  function with EF 66%. Left ventricle cavity is normal in size. Normal left ventricular wall thickness. Normal global wall motion. Calculated EF 66%. Left atrial cavity is normal in size. There is a Amplatzer ASO device (25 mm Amplatzer PFO occluder )  noted in the fossa ovalis.  Appears to be in good position.  No thrombus.  There  was suggestion of possible residual shunting across the interatrial septum however double contrast study at rest and with Valsalva reveals no residual shunting. Compared to the study done on 12/10/2020, no significant change.  TCD bubble study 10/28/2022: No HITS at rest. Partial curtain noted during Valsalva. Positive TCD  bubble study indicative of right to left medium sized shunt with Valsalva.  EKG:    EKG 03/04/2023: Normal sinus rhythm at the rate of 58 bpm, normal EKG.  Medications and allergies    No Known Allergies   Current Outpatient Medications:    oxyCODONE-acetaminophen (PERCOCET) 7.5-325 MG tablet, Take 1 tablet by mouth every 4 (four) hours as needed for severe pain (pain score 7-10)., Disp: 20 tablet, Rfl: 0   Prenatal Vit-Fe Fumarate-FA (MULTIVITAMIN-PRENATAL) 27-0.8 MG TABS tablet, Take 1 tablet by mouth daily at 12 noon. (Patient not taking: Reported on 04/09/2023), Disp: , Rfl:    ASSESSMENT AND PLAN: .      ICD-10-CM   1. 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 History of repair of congenital atrial septal defect (ASD)  Z87.74     3. 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 There is a risk of paradoxical embolism and stroke due to the residual shunt, necessitating further evaluation and  potential closure, especially given her young age of 89. Schedule a transesophageal echocardiogram (TEE) to assess the presence and size of the residual PFO. If confirmed, plan for closure with a different device to ensure complete sealing. Discuss the risks of the procedure, including less  than 1% risk of stroke, potential throat discomfort, and the very rare risk of esophageal perforation.  Pregnancy   Provided emotional support and reassurance regarding pregnancy and weight management, especially in light of the recent loss of a previous pregnancy.           Signed,  Yates Decamp, MD, [facility] 04/10/2023, 3:50 PM [facility] [address] [place], [place] [address] Phone: [phone]. Fax:  [phone]

## 2023-04-12 ENCOUNTER — Other Ambulatory Visit: Payer: Self-pay | Admitting: Certified Nurse Midwife

## 2023-04-12 ENCOUNTER — Telehealth: Payer: Self-pay

## 2023-04-12 ENCOUNTER — Other Ambulatory Visit: Payer: Self-pay | Admitting: Advanced Practice Midwife

## 2023-04-12 ENCOUNTER — Ambulatory Visit: Payer: Managed Care, Other (non HMO)

## 2023-04-12 ENCOUNTER — Ambulatory Visit: Admitting: Certified Nurse Midwife

## 2023-04-12 DIAGNOSIS — O3680X0 Pregnancy with inconclusive fetal viability, not applicable or unspecified: Secondary | ICD-10-CM

## 2023-04-12 DIAGNOSIS — Z3687 Encounter for antenatal screening for uncertain dates: Secondary | ICD-10-CM

## 2023-04-12 DIAGNOSIS — Z3A10 10 weeks gestation of pregnancy: Secondary | ICD-10-CM

## 2023-04-12 DIAGNOSIS — O021 Missed abortion: Secondary | ICD-10-CM

## 2023-04-12 DIAGNOSIS — Z3480 Encounter for supervision of other normal pregnancy, unspecified trimester: Secondary | ICD-10-CM

## 2023-04-12 DIAGNOSIS — Z3A01 Less than 8 weeks gestation of pregnancy: Secondary | ICD-10-CM

## 2023-04-12 HISTORY — DX: Missed abortion: O02.1

## 2023-04-12 MED ORDER — MISOPROSTOL 200 MCG PO TABS: 800.0000 ug | ORAL_TABLET | Freq: Once | ORAL | 1 refills | Status: DC

## 2023-04-12 MED ORDER — MIFEPRISTONE 200 MG PO TABS: 200.0000 mg | ORAL_TABLET | Freq: Once | ORAL | 0 refills | Status: DC

## 2023-04-12 MED ORDER — MISOPROSTOL 200 MCG PO TABS: 800.0000 ug | ORAL_TABLET | Freq: Once | ORAL | 0 refills | Status: DC

## 2023-04-12 NOTE — Telephone Encounter (Signed)
 TRIAGE VOICEMAIL: Darl Pikes, pharmacist at [facility], calling regarding rx's received on this patient for misoprostol (CYTOTEC) 200 MCG tablet & miFEPRIStone (MIFEPREX) 200 MG TABS . She advised the miFEPRIStone (MIFEPREX) 200 MG TABS is not something they are able to get. Their best recommendation for these rx's is to contact [facility] and see if they are able to get it and if not, they should know who can get it. [facility] is not able to order these as their warehouse doesn't have the miFEPRIStone (MIFEPREX) 200 MG TABS . They are unable to dispense misoprostol (CYTOTEC) 200 MCG tablet  if they can't dispense the miFEPRIStone (MIFEPREX) 200 MG TABS.

## 2023-04-12 NOTE — Progress Notes (Addendum)
 Progress Note Missed AB   Assessment/Plan  Incomplete Miscarriage  Based on the results of two ultrasounds, it is my medical judgment, to a high degree of medical certainty that the patient has missed AB in progress and that proceeding with medical management is indicated.  We discussed the usual etiologies of a miscarriage/missed abortion as well as management options to include medication, surgical, or expectant management. We discussed the pros and cons of each approach including the usual surgical risks of D&C, and the possibility of an incomplete abortion with expectant management or medication management. We touched on future family planning efforts post-miscarriage and management options when she conceives again in the future including having an early U/S for viability if she chooses.  After this discussion, patient opts for: medical management. Prescription sent to pharmacy.   Warning signs for heavy bleeding, infections and failed medical management reviewed.  Repeat misoprostol if no bleeding in 48 hours   Return to office in two week for lab only draw to check HCG.  No orders of the defined types were placed in this encounter.  No follow-ups on file.     Subjective  28 y.o. G2P1001 at [redacted]w[redacted]d presents for this follow-up after ultrasound showed non-viable pregnancy. This was a desired pregnancy.   Objective There were no vitals filed for this visit. A POS Performed at [facility], [address], [place], [place] [address]   General AppearanceNo acute distress, well appearing, and well nourished PulmonaryNormal work of breathing NeurologicAlert and oriented to person, place, and time PsychiatricMood and affect within normal limits   30/30 minutes was spent face-to-face with patient doing education and counseling around diagnosis

## 2023-04-13 ENCOUNTER — Encounter (HOSPITAL_COMMUNITY): Payer: Self-pay | Admitting: Anesthesiology

## 2023-04-14 ENCOUNTER — Encounter (HOSPITAL_COMMUNITY): Admission: RE | Payer: Self-pay | Source: Home / Self Care

## 2023-04-14 ENCOUNTER — Ambulatory Visit (HOSPITAL_COMMUNITY): Admission: RE | Admit: 2023-04-14 | Source: Home / Self Care | Admitting: Cardiovascular Disease

## 2023-04-14 SURGERY — TRANSESOPHAGEAL ECHOCARDIOGRAM (TEE) (CATHLAB)
Anesthesia: Monitor Anesthesia Care

## 2023-04-20 LAB — ABO/RH: ABO/RH(D): A POS

## 2023-04-23 ENCOUNTER — Encounter: Payer: Managed Care, Other (non HMO) | Admitting: Advanced Practice Midwife

## 2023-04-28 ENCOUNTER — Telehealth: Payer: Self-pay | Admitting: *Deleted

## 2023-04-28 DIAGNOSIS — Q2112 Patent foramen ovale: Secondary | ICD-10-CM

## 2023-04-28 DIAGNOSIS — Z8774 Personal history of (corrected) congenital malformations of heart and circulatory system: Secondary | ICD-10-CM

## 2023-04-28 DIAGNOSIS — Q211 Atrial septal defect, unspecified: Secondary | ICD-10-CM

## 2023-04-28 NOTE — Addendum Note (Signed)
 Addended by: Dossie Arbour on: 04/28/2023 01:20 PM   Modules accepted: Orders

## 2023-04-28 NOTE — Telephone Encounter (Signed)
 I spoke with patient regarding cancelled TEE.  Patient reports she had another medical issue at that time and TEE had to be cancelled.  She is now able to have the TEE and would like to schedule this. TEE scheduled for April 2 at 11:45.  Patient notified.  She will have CBC and BMP this week.  I verbally went over instructions with patient and will send to her through my chart.

## 2023-05-01 LAB — CBC
Hematocrit: 39.4 % (ref 34.0–46.6)
Hemoglobin: 13.4 g/dL (ref 11.1–15.9)
MCH: 31.8 pg (ref 26.6–33.0)
MCHC: 34 g/dL (ref 31.5–35.7)
MCV: 94 fL (ref 79–97)
Platelets: 307 10*3/uL (ref 150–450)
RBC: 4.21 x10E6/uL (ref 3.77–5.28)
RDW: 11.6 % — ABNORMAL LOW (ref 11.7–15.4)
WBC: 7.5 10*3/uL (ref 3.4–10.8)

## 2023-05-01 LAB — BASIC METABOLIC PANEL WITH GFR
BUN/Creatinine Ratio: 17 (ref 9–23)
BUN: 11 mg/dL (ref 6–20)
CO2: 25 mmol/L (ref 20–29)
Calcium: 9.2 mg/dL (ref 8.7–10.2)
Chloride: 100 mmol/L (ref 96–106)
Creatinine, Ser: 0.66 mg/dL (ref 0.57–1.00)
Glucose: 81 mg/dL (ref 70–99)
Potassium: 4.2 mmol/L (ref 3.5–5.2)
Sodium: 140 mmol/L (ref 134–144)
eGFR: 123 mL/min/{1.73_m2} (ref >59)

## 2023-05-04 NOTE — Progress Notes (Signed)
 Called patient with pre-procedure instructions for tomorrow.   Patient informed of:   Time to arrive for procedure. 1030 Remain NPO past midnight.  Must have a ride home and a responsible adult to remain with them for 24 hours post procedure.  Confirmed blood thinner. Confirmed no breaks in taking blood thinner for 3+ weeks prior to procedure. Confirmed patient stopped all GLP-1s and GLP-2s for at least one week before procedure.

## 2023-05-05 ENCOUNTER — Other Ambulatory Visit: Payer: Self-pay

## 2023-05-05 ENCOUNTER — Ambulatory Visit (HOSPITAL_COMMUNITY)
Admission: RE | Admit: 2023-05-05 | Discharge: 2023-05-05 | Disposition: A | Discharge status: Home / Self Care | Attending: Internal Medicine | Admitting: Internal Medicine

## 2023-05-05 ENCOUNTER — Ambulatory Visit (HOSPITAL_BASED_OUTPATIENT_CLINIC_OR_DEPARTMENT_OTHER)
Admission: RE | Admit: 2023-05-05 | Discharge: 2023-05-05 | Disposition: A | Discharge status: Home / Self Care | Source: Ambulatory Visit | Attending: Internal Medicine | Admitting: Internal Medicine

## 2023-05-05 ENCOUNTER — Encounter (HOSPITAL_COMMUNITY): Payer: Self-pay | Admitting: Internal Medicine

## 2023-05-05 ENCOUNTER — Ambulatory Visit (HOSPITAL_COMMUNITY): Admitting: Registered Nurse

## 2023-05-05 ENCOUNTER — Encounter (HOSPITAL_COMMUNITY)
Admission: RE | Disposition: A | Discharge status: Home / Self Care | Payer: Self-pay | Source: Home / Self Care | Attending: Internal Medicine

## 2023-05-05 DIAGNOSIS — Q211 Atrial septal defect, unspecified: Secondary | ICD-10-CM | POA: Insufficient documentation

## 2023-05-05 DIAGNOSIS — I749 Embolism and thrombosis of unspecified artery: Secondary | ICD-10-CM | POA: Diagnosis not present

## 2023-05-05 DIAGNOSIS — I688 Other cerebrovascular disorders in diseases classified elsewhere: Secondary | ICD-10-CM | POA: Insufficient documentation

## 2023-05-05 DIAGNOSIS — Z8774 Personal history of (corrected) congenital malformations of heart and circulatory system: Secondary | ICD-10-CM | POA: Insufficient documentation

## 2023-05-05 DIAGNOSIS — Q2112 Patent foramen ovale: Secondary | ICD-10-CM

## 2023-05-05 DIAGNOSIS — Z87891 Personal history of nicotine dependence: Secondary | ICD-10-CM | POA: Insufficient documentation

## 2023-05-05 HISTORY — PX: TRANSESOPHAGEAL ECHOCARDIOGRAM (CATH LAB): EP1270

## 2023-05-05 LAB — ECHO TEE

## 2023-05-05 LAB — PREGNANCY, URINE: Preg Test, Ur: NEGATIVE

## 2023-05-05 SURGERY — TRANSESOPHAGEAL ECHOCARDIOGRAM (TEE) (CATHLAB)
Anesthesia: Monitor Anesthesia Care

## 2023-05-05 MED ORDER — SODIUM CHLORIDE 0.9 % IV SOLN: INTRAVENOUS | Status: DC

## 2023-05-05 MED ORDER — LIDOCAINE 2% (20 MG/ML) 5 ML SYRINGE
INTRAMUSCULAR | Status: DC | PRN
  Administered 2023-05-05: 80 mg via INTRAVENOUS

## 2023-05-05 MED ORDER — PROPOFOL 500 MG/50ML IV EMUL
INTRAVENOUS | Status: DC | PRN
  Administered 2023-05-05: 180 ug/kg/min via INTRAVENOUS

## 2023-05-05 MED ORDER — PROPOFOL 10 MG/ML IV BOLUS
INTRAVENOUS | Status: DC | PRN
Start: 2023-05-05 — End: 2023-05-05
  Administered 2023-05-05 (×2): 100 mg via INTRAVENOUS
  Administered 2023-05-05 (×2): 50 mg via INTRAVENOUS

## 2023-05-05 MED ORDER — SALINE SPRAY 0.65 % NA SOLN: 1.0000 | NASAL | Status: DC | PRN

## 2023-05-05 NOTE — Anesthesia Postprocedure Evaluation (Signed)
 Anesthesia Post Note  Patient: Kristen Grant  Procedure(s) Performed: TRANSESOPHAGEAL ECHOCARDIOGRAM     Patient location during evaluation: Cath Lab Anesthesia Type: MAC Level of consciousness: awake and alert, patient cooperative and oriented Pain management: pain level controlled Vital Signs Assessment: post-procedure vital signs reviewed and stable Respiratory status: spontaneous breathing, nonlabored ventilation and respiratory function stable Cardiovascular status: stable and blood pressure returned to baseline Postop Assessment: no apparent nausea or vomiting and able to ambulate Anesthetic complications: no   No notable events documented.  Last Vitals:  Vitals:   05/05/23 1345 05/05/23 1355  BP: 121/74 (!) 96/57  Pulse: 64 83  Resp: 14 14  Temp:  (!) 36.2 C  SpO2: 98% 97%    Last Pain:  Vitals:   05/05/23 1323  TempSrc:   PainSc: 0-No pain                 Stephan Nelis,E. Kinnley Paulson

## 2023-05-05 NOTE — Transfer of Care (Signed)
 Immediate Anesthesia Transfer of Care Note  Patient: Kristen Grant  Procedure(s) Performed: TRANSESOPHAGEAL ECHOCARDIOGRAM  Patient Location: Cath Lab  Anesthesia Type:MAC  Level of Consciousness: drowsy  Airway & Oxygen Therapy: Patient Spontanous Breathing  Post-op Assessment: Report given to RN and Post -op Vital signs reviewed and stable  Post vital signs: Reviewed and stable  Last Vitals:  Vitals Value Taken Time  BP 107/53 05/05/23 1323  Temp    Pulse 78 05/05/23 1324  Resp 16 05/05/23 1324  SpO2 96 % 05/05/23 1324  Vitals shown include unfiled device data.  Last Pain:  Vitals:   05/05/23 1323  TempSrc:   PainSc: 0-No pain         Complications: No notable events documented.

## 2023-05-05 NOTE — Interval H&P Note (Signed)
 History and Physical Interval Note:  05/05/2023 11:48 AM  Kristen Grant  has presented today for surgery, with the diagnosis of PFO.  The various methods of treatment have been discussed with the patient and family. After consideration of risks, benefits and other options for treatment, the patient has consented to  Procedure(s): TRANSESOPHAGEAL ECHOCARDIOGRAM (N/A) as a surgical intervention.  The patient's history has been reviewed, patient examined, no change in status, stable for surgery.  I have reviewed the patient's chart and labs.  Questions were answered to the patient's satisfaction.     Chrystie Nose

## 2023-05-05 NOTE — Anesthesia Preprocedure Evaluation (Addendum)
 Anesthesia Evaluation  Patient identified by MRN, date of birth, ID band Patient awake    Reviewed: Allergy & Precautions, NPO status , Patient's Chart, lab work & pertinent test results  History of Anesthesia Complications Negative for: history of anesthetic complications  Airway Mallampati: I  TM Distance: >3 FB Neck ROM: Full    Dental  (+) Dental Advisory Given   Pulmonary former smoker   breath sounds clear to auscultation       Cardiovascular  Rhythm:Regular Rate:Normal  PFO '23 ECHO:  Normal LV systolic function with EF 66%. Left ventricle cavity is normal  in size. Normal left ventricular wall thickness. Normal global wall  motion. Calculated EF 66%.  Left atrial cavity is normal in size. There is a Amplatzer ASO device (25  mm Amplatzer PFO occluder )  noted in the fossa ovalis.  Appears to be in  good position.  No thrombus.  There  was suggestion of possible residual  shunting across the interatrial septum however double contrast study at  rest and with Valsalva reveals no residual shunting.     Neuro/Psych  Headaches  Anxiety Depression    CVA    GI/Hepatic negative GI ROS, Neg liver ROS,,,  Endo/Other  BMI 26.6  Renal/GU negative Renal ROS     Musculoskeletal   Abdominal   Peds  Hematology negative hematology ROS (+)   Anesthesia Other Findings   Reproductive/Obstetrics Miscarriage 03/2023                             Anesthesia Physical Anesthesia Plan  ASA: 2  Anesthesia Plan: MAC   Post-op Pain Management: Minimal or no pain anticipated   Induction:   PONV Risk Score and Plan: 2 and Treatment may vary due to age or medical condition  Airway Management Planned: Natural Airway and Nasal Cannula  Additional Equipment: None  Intra-op Plan:   Post-operative Plan:   Informed Consent: I have reviewed the patients History and Physical, chart, labs and discussed  the procedure including the risks, benefits and alternatives for the proposed anesthesia with the patient or authorized representative who has indicated his/her understanding and acceptance.     Dental advisory given  Plan Discussed with: CRNA and Surgeon  Anesthesia Plan Comments:         Anesthesia Quick Evaluation

## 2023-05-05 NOTE — Progress Notes (Signed)
  Echocardiogram Echocardiogram Transesophageal has been performed.  Janalyn Harder 05/05/2023, 1:48 PM

## 2023-05-05 NOTE — CV Procedure (Signed)
 TRANSESOPHAGEAL ECHOCARDIOGRAM (TEE) NOTE  INDICATIONS: Possible atrial shunt, s/p PFO closure  PROCEDURE:   Informed consent was obtained prior to the procedure. The risks, benefits and alternatives for the procedure were discussed and the patient comprehended these risks.  Risks include, but are not limited to, cough, sore throat, vomiting, nausea, somnolence, esophageal and stomach trauma or perforation, bleeding, low blood pressure, aspiration, pneumonia, infection, trauma to the teeth and death.    After a procedural time-out, the patient was given propofol for sedation by anesthesia. See their separate report.  The patient's heart rate, blood pressure, and oxygen saturation are monitored continuously during the procedure.The oropharynx was anesthetized with topical cetacaine.  The transesophageal probe was inserted in the esophagus and stomach without difficulty and multiple views were obtained.  The patient was kept under observation until the patient left the procedure room.  I was present face-to-face 100% of this time. The patient left the procedure room in stable condition.   Agitated microbubble saline contrast was administered.  COMPLICATIONS:    There were no immediate complications.  Findings:  LEFT VENTRICLE: The left ventricular wall thickness is normal.  The left ventricular cavity is normal in size. Wall motion is normal.  LVEF is 60-65%.  RIGHT VENTRICLE:  The right ventricle is normal in structure and function without any thrombus or masses.    LEFT ATRIUM:  The left atrium is normal in size without any thrombus or masses.  There is not spontaneous echo contrast ("smoke") in the left atrium consistent with a low flow state.  LEFT ATRIAL APPENDAGE:  The left atrial appendage is free of any thrombus or masses. The appendage has single lobes. Pulse doppler indicates high flow in the appendage.  ATRIAL SEPTUM:  There is a previously placed 25 mm Amplatzer septal  occluder device which is well seated over the interatrial septum. Well-visualized with 2D/3D modes. There is no evidence for interatrial shunting by color doppler and saline microbubble.  RIGHT ATRIUM:  The right atrium is normal in size and function without any thrombus or masses.  MITRAL VALVE:  The mitral valve is normal in structure and function with  trivial  regurgitation.  There were no vegetations or stenosis.  AORTIC VALVE:  The aortic valve is trileaflet, normal in structure and function with  no  regurgitation.  There were no vegetations or stenosis  TRICUSPID VALVE:  The tricuspid valve is normal in structure and function with  trivial  regurgitation.  There were no vegetations or stenosis   PULMONIC VALVE:  The pulmonic valve is normal in structure and function with  trivial  regurgitation.  There were no vegetations or stenosis.   AORTIC ARCH, ASCENDING AND DESCENDING AORTA:  There was no Myrtis Ser et. Al, 1992) atherosclerosis of the ascending aorta, aortic arch, or proximal descending aorta.  12. PULMONARY VEINS: Anomalous pulmonary venous return was not noted.  13. PERICARDIUM: The pericardium appeared normal and non-thickened.  There is no pericardial effusion.  IMPRESSION:   Stable PFO closure with 25 mm Amplatzer occluder device - no evidence of residual atrial level shunt by color doppler or saline microbubble contrast No LAA thrombus Trivial MR, AI and TR Normal biatrial size LVEF 60-65%, normal wall motion  RECOMMENDATIONS:     No evidence of atrial level shunting noted.  Time Spent Directly with the Patient:  45 minutes   Chrystie Nose, MD, [facility], FACP  [place]  [facility]  Medical Director of the Advanced Lipid Disorders &  Cardiovascular Risk Reduction Clinic Diplomate of the American Board of Clinical Lipidology Attending Cardiologist  Direct Dial: [phone]  Fax: [phone]  Website:  [email or web address]  Lisette Abu Aerilynn Goin 05/05/2023,  1:28 PM

## 2023-05-14 ENCOUNTER — Telehealth: Payer: Self-pay | Admitting: *Deleted

## 2023-05-14 NOTE — Telephone Encounter (Signed)
 I spoke with patient.  She has questions for Dr Jacinto Halim regarding recent test and need for procedure.  She is asking if Dr Jacinto Halim could call her or if she needs to come in for an office visit

## 2023-05-14 NOTE — Telephone Encounter (Signed)
 Yates Decamp, MD  Dossie Arbour, RN Although the TEE did not reveal significant evidence of right-to-left shunting, I reviewed the study, I suspect she probably has intracardiac shunting and will be best to proceed with intracardiac echo evaluation and possibly PFO closure for completeness.  Case was also discussed by Dr. Rennis Golden with Dr. Pearlean Brownie as well.  TCD bubble study is markedly positive.

## 2023-05-14 NOTE — Telephone Encounter (Signed)
 Discussed with the patient, patient's TEE reviewed personally by me, I do suspect that there is a defect however extremely difficult to make out.  Unfortunately patient being in deep sleep and deep sedation, unable to perform Valsalva.  Dr. Rennis Golden also discussed with Dr. Pearlean Brownie, the curtainlike effect/Valsalva related TCD bubble study which was strongly positive for residual leak at the PFO level/ASD level suggest that it is a fairly decent sized residual defect hence would prefer to proceed with PFO closure to complete therapy.  I have discussed with the patient regarding overall <1% risk of death, stroke, need for surgical intervention, bleeding but not limited to this as a risk.  Patient is willing to proceed.

## 2023-05-17 ENCOUNTER — Encounter: Payer: Self-pay | Admitting: *Deleted

## 2023-05-17 NOTE — Telephone Encounter (Signed)
 I spoke with patient and scheduled procedure for April 18.  I verbally went over instructions with patient and will send instructions to her through my chart

## 2023-05-19 ENCOUNTER — Telehealth: Payer: Self-pay | Admitting: *Deleted

## 2023-05-19 NOTE — Telephone Encounter (Signed)
 PFO Closure scheduled at [facility] for: Friday May 21, 2023 7:30 AM Arrival time [facility] Main Entrance A at: 5:30 AM  Nothing to eat after midnight prior to procedure, clear liquids until 5 AM day of procedure.  Medication instructions: -Usual morning medications can be taken with sips of water including aspirin 81 mg.  Plan to go home the same day, you will only stay overnight if medically necessary.  You must have responsible adult to drive you home.  Someone must be with you the first 24 hours after you arrive home.  Reviewed procedure instructions with patient.

## 2023-05-20 ENCOUNTER — Telehealth: Payer: Self-pay | Admitting: Cardiology

## 2023-05-20 DIAGNOSIS — Z01812 Encounter for preprocedural laboratory examination: Secondary | ICD-10-CM

## 2023-05-20 DIAGNOSIS — Q2112 Patent foramen ovale: Secondary | ICD-10-CM

## 2023-05-20 NOTE — Telephone Encounter (Signed)
 Patient called and said that her procedure was cancelled due to insurance not covering it. Insurance told patient that Dr. Berry Bristol needs to write a letter to specify why she need the procedure done.

## 2023-05-20 NOTE — Telephone Encounter (Signed)
 Received message from precert team that [employer] has denied the request for PFO closure and that procedure for tomorrow would need to be postponed. Pre cert will follow up to see if appeal can be done. Patient notified and cath lab notified to cancel procedure.

## 2023-05-20 NOTE — Telephone Encounter (Signed)
 I spoke with patient and let her know office will provide information needed to insurance but that precert has said determination would not be made prior to procedure tomorrow.  I told patient procedure could be rescheduled if approved by insurance and we would let her know what insurance determined.

## 2023-05-20 NOTE — Telephone Encounter (Signed)
 I can do Peer to peer review

## 2023-05-21 ENCOUNTER — Ambulatory Visit (HOSPITAL_COMMUNITY): Admission: RE | Admit: 2023-05-21 | Source: Home / Self Care | Admitting: Cardiology

## 2023-05-21 ENCOUNTER — Encounter (HOSPITAL_COMMUNITY): Admission: RE | Payer: Self-pay | Source: Home / Self Care

## 2023-05-21 SURGERY — PATENT FORAMEN OVALE (PFO) CLOSURE
Anesthesia: LOCAL

## 2023-05-24 ENCOUNTER — Encounter: Payer: Self-pay | Admitting: *Deleted

## 2023-05-24 NOTE — Telephone Encounter (Signed)
 Received message from pre cert that procedure has been approved.  I spoke with patient and scheduled PFO closure for May 14.  Nurse room visit for EKG scheduled for May 1 at 11 AM.  Patient aware she will need to have lab work drawn prior to procedure.  I verbally reviewed instructions with patient and will send copy to her through my chart

## 2023-06-03 ENCOUNTER — Ambulatory Visit: Attending: Cardiology | Admitting: Cardiology

## 2023-06-03 VITALS — BP 113/73 | HR 64 | Ht 70.8 in | Wt 185.2 lb

## 2023-06-03 DIAGNOSIS — I688 Other cerebrovascular disorders in diseases classified elsewhere: Secondary | ICD-10-CM | POA: Diagnosis not present

## 2023-06-03 DIAGNOSIS — I749 Embolism and thrombosis of unspecified artery: Secondary | ICD-10-CM

## 2023-06-03 NOTE — Progress Notes (Signed)
   Nurse Visit   Date of Encounter: 06/03/2023 ID: Kristen Grant, DOB 09/03/1995, MRN [id number]  PCP:  Patient, No Pcp Per   [facility] Providers Cardiologist:  None      Visit Details   VS:  BP 113/73 (BP Location: Right Arm, Patient Position: Sitting, Cuff Size: Normal)   Pulse 64   Ht 5' 10.8" (1.798 m)   Wt 185 lb 3.2 oz (84 kg)   BMI 25.98 kg/m  , BMI Body mass index is 25.98 kg/m.  Wt Readings from Last 3 Encounters:  06/03/23 185 lb 3.2 oz (84 kg)  05/05/23 180 lb (81.6 kg)  04/09/23 183 lb 3.2 oz (83.1 kg)     Reason for visit: ekg Performed today:   , Vitals, EKG, Provider consulted:Ganji, and Education Changes (medications, testing, etc.) : none Length of Visit: 10 minutes    Medications Adjustments/Labs and Tests Ordered: Orders Placed This Encounter  Procedures   EKG 12-Lead   EKG 12-Lead   No orders of the defined types were placed in this encounter.   Lab Results  Component Value Date   WBC 7.5 04/30/2023   HGB 13.4 04/30/2023   HCT 39.4 04/30/2023   MCV 94 04/30/2023   PLT 307 04/30/2023   Lab Results  Component Value Date   NA 140 04/30/2023   K 4.2 04/30/2023   CO2 25 04/30/2023   GLUCOSE 81 04/30/2023   BUN 11 04/30/2023   CREATININE 0.66 04/30/2023   CALCIUM  9.2 04/30/2023   EGFR 123 04/30/2023   GFRNONAA >60 04/08/2023   EKG 06/03/2023: Normal sinus rhythm with rate of 63 bpm, normal EKG.  No significant change from 03/04/2023.  Stable for proceeding with PFO repair  Signed, Knox Perl, MD  06/03/2023 6:22 PM

## 2023-06-04 LAB — CBC
Hematocrit: 39.5 % (ref 34.0–46.6)
Hemoglobin: 13.3 g/dL (ref 11.1–15.9)
MCH: 31.2 pg (ref 26.6–33.0)
MCHC: 33.7 g/dL (ref 31.5–35.7)
MCV: 93 fL (ref 79–97)
Platelets: 303 10*3/uL (ref 150–450)
RBC: 4.26 x10E6/uL (ref 3.77–5.28)
RDW: 11.4 % — ABNORMAL LOW (ref 11.7–15.4)
WBC: 8.4 10*3/uL (ref 3.4–10.8)

## 2023-06-04 LAB — BASIC METABOLIC PANEL WITH GFR
BUN/Creatinine Ratio: 14 (ref 9–23)
BUN: 8 mg/dL (ref 6–20)
CO2: 23 mmol/L (ref 20–29)
Calcium: 9.3 mg/dL (ref 8.7–10.2)
Chloride: 99 mmol/L (ref 96–106)
Creatinine, Ser: 0.59 mg/dL (ref 0.57–1.00)
Glucose: 91 mg/dL (ref 70–99)
Potassium: 4.1 mmol/L (ref 3.5–5.2)
Sodium: 139 mmol/L (ref 134–144)
eGFR: 127 mL/min/{1.73_m2} (ref >59)

## 2023-06-15 ENCOUNTER — Ambulatory Visit: Admitting: Cardiology

## 2023-06-15 ENCOUNTER — Telehealth: Payer: Self-pay | Admitting: *Deleted

## 2023-06-15 NOTE — Telephone Encounter (Signed)
 PFO closure scheduled at [facility] for: Wednesday Jun 16, 2023 8:30 AM Arrival time [facility] Main Entrance A at: 6:30 AM  Nothing to eat after midnight prior to procedure, clear liquids until 5 AM day of procedure.  Medication instructions: -Usual morning medications can be taken with sips of water including aspirin  81 mg.  Plan to go home the same day, you will only stay overnight if medically necessary.  You must have responsible adult to drive you home.  Someone must be with you the first 24 hours after you arrive home.  Reviewed procedure instructions with patient.

## 2023-06-16 ENCOUNTER — Other Ambulatory Visit: Payer: Self-pay

## 2023-06-16 ENCOUNTER — Telehealth: Payer: Self-pay | Admitting: Cardiology

## 2023-06-16 ENCOUNTER — Encounter (HOSPITAL_COMMUNITY)
Admission: RE | Disposition: A | Discharge status: Home / Self Care | Payer: Self-pay | Source: Home / Self Care | Attending: Cardiology

## 2023-06-16 ENCOUNTER — Encounter: Payer: Self-pay | Admitting: Cardiology

## 2023-06-16 ENCOUNTER — Ambulatory Visit (HOSPITAL_COMMUNITY)
Admission: RE | Admit: 2023-06-16 | Discharge: 2023-06-16 | Disposition: A | Discharge status: Home / Self Care | Attending: Cardiology | Admitting: Cardiology

## 2023-06-16 ENCOUNTER — Other Ambulatory Visit (HOSPITAL_COMMUNITY): Payer: Self-pay

## 2023-06-16 DIAGNOSIS — Q2112 Patent foramen ovale: Secondary | ICD-10-CM | POA: Diagnosis present

## 2023-06-16 DIAGNOSIS — Q211 Atrial septal defect, unspecified: Secondary | ICD-10-CM | POA: Diagnosis not present

## 2023-06-16 HISTORY — PX: PATENT FORAMEN OVALE(PFO) CLOSURE: CATH118300

## 2023-06-16 LAB — PREGNANCY, URINE: Preg Test, Ur: NEGATIVE

## 2023-06-16 LAB — POCT ACTIVATED CLOTTING TIME
Activated Clotting Time: 227 s
Activated Clotting Time: 205 s

## 2023-06-16 SURGERY — PATENT FORAMEN OVALE (PFO) CLOSURE
Anesthesia: LOCAL

## 2023-06-16 MED ORDER — ASPIRIN 81 MG PO TBEC: 81.0000 mg | DELAYED_RELEASE_TABLET | Freq: Two times a day (BID) | ORAL | Status: DC

## 2023-06-16 MED ORDER — LIDOCAINE HCL (PF) 1 % IJ SOLN
INTRAMUSCULAR | Status: DC | PRN
  Administered 2023-06-16: 15 mL

## 2023-06-16 MED ORDER — SODIUM CHLORIDE 0.9 % IV SOLN: 250.0000 mL | INTRAVENOUS | Status: DC | PRN

## 2023-06-16 MED ORDER — FENTANYL CITRATE (PF) 100 MCG/2ML IJ SOLN
INTRAMUSCULAR | Status: DC | PRN
  Administered 2023-06-16: 50 ug via INTRAVENOUS
  Administered 2023-06-16 (×3): 25 ug via INTRAVENOUS

## 2023-06-16 MED ORDER — HEPARIN SODIUM (PORCINE) 1000 UNIT/ML IJ SOLN
INTRAMUSCULAR | Status: DC | PRN
  Administered 2023-06-16: 8000 [IU] via INTRAVENOUS

## 2023-06-16 MED ORDER — CEFAZOLIN SODIUM-DEXTROSE 2-4 GM/100ML-% IV SOLN
2.0000 g | INTRAVENOUS | Status: AC
  Administered 2023-06-16: 2 g via INTRAVENOUS
  Filled 2023-06-16: qty 100

## 2023-06-16 MED ORDER — SODIUM CHLORIDE 0.9 % WEIGHT BASED INFUSION: 1.0000 mL/kg/h | INTRAVENOUS | Status: DC

## 2023-06-16 MED ORDER — OXYCODONE-ACETAMINOPHEN 7.5-325 MG PO TABS
1.0000 | ORAL_TABLET | ORAL | 0 refills | Status: DC | PRN
  Filled 2023-06-16: qty 20, 3d supply, fill #0

## 2023-06-16 MED ORDER — SODIUM CHLORIDE 0.9 % WEIGHT BASED INFUSION: 3.0000 mL/kg/h | INTRAVENOUS | Status: AC

## 2023-06-16 MED ORDER — ACETAMINOPHEN 325 MG PO TABS: 650.0000 mg | ORAL_TABLET | ORAL | Status: DC | PRN

## 2023-06-16 MED ORDER — FENTANYL CITRATE (PF) 100 MCG/2ML IJ SOLN
INTRAMUSCULAR | Status: AC
  Filled 2023-06-16: qty 2

## 2023-06-16 MED ORDER — ONDANSETRON HCL 4 MG/2ML IJ SOLN: 4.0000 mg | Freq: Four times a day (QID) | INTRAMUSCULAR | Status: DC | PRN

## 2023-06-16 MED ORDER — MIDAZOLAM HCL 2 MG/2ML IJ SOLN
INTRAMUSCULAR | Status: AC
  Filled 2023-06-16: qty 2

## 2023-06-16 MED ORDER — LIDOCAINE HCL (PF) 1 % IJ SOLN
INTRAMUSCULAR | Status: AC
  Filled 2023-06-16: qty 30

## 2023-06-16 MED ORDER — HEPARIN (PORCINE) IN NACL 2000-0.9 UNIT/L-% IV SOLN
INTRAVENOUS | Status: DC | PRN
  Administered 2023-06-16: 1000 mL via INTRAVENOUS

## 2023-06-16 MED ORDER — SODIUM CHLORIDE 0.9% FLUSH: 3.0000 mL | INTRAVENOUS | Status: DC | PRN

## 2023-06-16 MED ORDER — ASPIRIN 81 MG PO CHEW
81.0000 mg | CHEWABLE_TABLET | ORAL | Status: AC
  Administered 2023-06-16: 81 mg via ORAL
  Filled 2023-06-16: qty 1

## 2023-06-16 MED ORDER — MIDAZOLAM HCL 2 MG/2ML IJ SOLN
INTRAMUSCULAR | Status: DC | PRN
  Administered 2023-06-16 (×2): 1 mg via INTRAVENOUS
  Administered 2023-06-16: 2 mg via INTRAVENOUS

## 2023-06-16 SURGICAL SUPPLY — 18 items
CATH 8FR ACUNAV REPROCESSED (CATHETERS) IMPLANT
CATH EXPO 5F MPA-1 (CATHETERS) IMPLANT
CATH REPROCESSED 8FR ACUNAV (CATHETERS) ×1 IMPLANT
CLOSURE PERCLOSE PROSTYLE (VASCULAR PRODUCTS) IMPLANT
COVER PRB 48X5XTLSCP FOLD TPE (BAG) IMPLANT
COVER SWIFTLINK CONNECTOR (BAG) IMPLANT
GUIDEWIRE ANGLED .035X150CM (WIRE) IMPLANT
KIT HEART LEFT (KITS) ×1 IMPLANT
KIT MICROPUNCTURE NIT STIFF (SHEATH) IMPLANT
PACK CARDIAC CATHETERIZATION (CUSTOM PROCEDURE TRAY) ×1 IMPLANT
SHEATH FAST CATH 12F 12CM (SHEATH) IMPLANT
SHEATH INTROD W/O MIN 9FR 25CM (SHEATH) IMPLANT
SHEATH PINNACLE 8F 10CM (SHEATH) IMPLANT
TRANSDUCER W/STOPCOCK (MISCELLANEOUS) ×1 IMPLANT
TUBING CIL FLEX 10 FLL-RA (TUBING) ×1 IMPLANT
WIRE EMERALD 3MM-J .035X150CM (WIRE) IMPLANT
WIRE HI TORQ VERSACORE-J 145CM (WIRE) IMPLANT
WIRE MICROINTRODUCER 60CM (WIRE) IMPLANT

## 2023-06-16 NOTE — Telephone Encounter (Signed)
 Patient had evaluation for history of PFO/ASD repair, evaluation revealed complete closure of the atrial septal defect with no residual defect.  Patient had no complication during the procedure, discussed with the patient, she does not need any further follow-up from me unless she has any chronic complications or issues she will call us  to set up the appointment.

## 2023-06-16 NOTE — Discharge Instructions (Signed)
 Femoral Site Care This sheet gives you information about how to care for yourself after your procedure. Your health care provider may also give you more specific instructions. If you have problems or questions, contact your health care provider. What can I expect after the procedure?  After the procedure, it is common to have: Bruising that usually fades within 1-2 weeks. Tenderness at the site. Follow these instructions at home: Wound care Follow instructions from your health care provider about how to take care of your insertion site. Make sure you: Wash your hands with soap and water before you change your bandage (dressing). If soap and water are not available, use hand sanitizer. Remove your dressing as told by your health care provider. In 24 hours Do not take baths, swim, or use a hot tub until your health care provider approves. You may shower 24 hours after the procedure or as told by your health care provider. Gently wash the site with plain soap and water. Pat the area dry with a clean towel. Do not rub the site. This may cause bleeding. Do not apply powder or lotion to the site. Keep the site clean and dry. Check your femoral site every day for signs of infection. Check for: Redness, swelling, or pain. Fluid or blood. Warmth. Pus or a bad smell. Activity For the first 2-3 days after your procedure, or as long as directed: Avoid climbing stairs as much as possible. Do not squat. Do not lift anything that is heavier than 10 lb (4.5 kg), or the limit that you are told, until your health care provider says that it is safe. For 5 days Rest as directed. Avoid sitting for a long time without moving. Get up to take short walks every 1-2 hours. Do not drive for 24 hours if you were given a medicine to help you relax (sedative). General instructions Take over-the-counter and prescription medicines only as told by your health care provider. Keep all follow-up visits as told by your  health care provider. This is important. Contact a health care provider if you have: A fever or chills. You have redness, swelling, or pain around your insertion site. Get help right away if: The catheter insertion area swells very fast. You pass out. You suddenly start to sweat or your skin gets clammy. The catheter insertion area is bleeding, and the bleeding does not stop when you hold steady pressure on the area. The area near or just beyond the catheter insertion site becomes pale, cool, tingly, or numb. These symptoms may represent a serious problem that is an emergency. Do not wait to see if the symptoms will go away. Get medical help right away. Call your local emergency services (911 in the [place] Do not drive yourself to the hospital. Summary After the procedure, it is common to have bruising that usually fades within 1-2 weeks. Check your femoral site every day for signs of infection. Do not lift anything that is heavier than 10 lb (4.5 kg), or the limit that you are told, until your health care provider says that it is safe. This information is not intended to replace advice given to you by your health care provider. Make sure you discuss any questions you have with your health care provider. Document Revised: 02/01/2017 Document Reviewed: 02/01/2017 Elsevier Patient Education  2020 [employer].

## 2023-06-16 NOTE — H&P (Signed)
 Cardiology Office Note:  .   Date:  06/16/2023  ID:  Kristen Grant, DOB Oct 07, 1995, MRN [id number] PCP: Patient, No Pcp Per  [facility] Providers Cardiologist:  None   History of Present Illness: .   Kristen Grant is a 28 y.o. Caucasian female patient with no significant prior cardiovascular history, patient was admitted to the hospital on 10/11/2020 with acute stroke, she had right distal M1 segment near complete occlusion for which she mechanical thrombectomy, but due to recurrence of strokelike symptoms, underwent repeat percutaneous thrombectomy on 10/21/2020.  She was also found to have a moderate to large sized PFO by transcranial bubble study and a PFO by TEE.  Underwent repair of the PFO on 12/10/2020   Discussed the use of AI scribe software for clinical note transcription with the patient, who gave verbal consent to proceed.  History of Present Illness   TCD bubble study, conducted in September of the previous year, revealed a possible residual defect following a device closure of PFO. The patient reports no specific symptoms related to this finding. She has been referred back for further evaluation and possible intervention by neurology. Now scheduled for PFO repair.      Labs   Lab Results  Component Value Date   CHOL 181 10/07/2022   HDL 52 10/07/2022   LDLCALC 112 (H) 10/07/2022   TRIG 94 10/07/2022   CHOLHDL 3.5 10/07/2022   Lab Results  Component Value Date   NA 139 06/03/2023   K 4.1 06/03/2023   CO2 23 06/03/2023   GLUCOSE 91 06/03/2023   BUN 8 06/03/2023   CREATININE 0.59 06/03/2023   CALCIUM  9.3 06/03/2023   EGFR 127 06/03/2023   GFRNONAA >60 04/08/2023      Latest Ref Rng & Units 06/03/2023    3:03 PM 04/30/2023    4:17 PM 04/08/2023    3:17 PM  BMP  Glucose 70 - 99 mg/dL 91  81  80   BUN 6 - 20 mg/dL 8  11  11    Creatinine 0.57 - 1.00 mg/dL 1.30  8.65  7.84   BUN/Creat Ratio 9 - 23 14  17     Sodium 134 - 144 mmol/L 139  140  138   Potassium 3.5  - 5.2 mmol/L 4.1  4.2  3.4   Chloride 96 - 106 mmol/L 99  100  101   CO2 20 - 29 mmol/L 23  25  25    Calcium  8.7 - 10.2 mg/dL 9.3  9.2  9.3       Latest Ref Rng & Units 06/03/2023    3:03 PM 04/30/2023    4:17 PM 04/08/2023    3:17 PM  CBC  WBC 3.4 - 10.8 x10E3/uL 8.4  7.5  7.2   Hemoglobin 11.1 - 15.9 g/dL 69.6  29.5  28.4   Hematocrit 34.0 - 46.6 % 39.5  39.4  36.1   Platelets 150 - 450 x10E3/uL 303  307  264    Lab Results  Component Value Date   HGBA1C 5.0 10/07/2022    Lab Results  Component Value Date   TSH 0.821 01/13/2021   Review of Systems  Cardiovascular:  Negative for chest pain, dyspnea on exertion and leg swelling.   Physical Exam:   VS:  BP 111/72   Pulse 70   Temp 97.8 F (36.6 C) (Oral)   Resp 16   Ht 5\' 9"  (1.753 m)   Wt 83.9 kg   LMP 05/31/2023 (  Approximate)   SpO2 99%   BMI 27.32 kg/m    Wt Readings from Last 3 Encounters:  06/16/23 83.9 kg  06/03/23 84 kg  05/05/23 81.6 kg    Physical Exam Neck:     Vascular: No carotid bruit or JVD.  Cardiovascular:     Rate and Rhythm: Normal rate and regular rhythm.     Pulses: Intact distal pulses.     Heart sounds: Normal heart sounds. No murmur heard.    No gallop.  Pulmonary:     Effort: Pulmonary effort is normal.     Breath sounds: Normal breath sounds.  Abdominal:     General: Bowel sounds are normal.     Palpations: Abdomen is soft.  Musculoskeletal:     Right lower leg: No edema.     Left lower leg: No edema.    Studies Reviewed: Aaron Aas    Intracardiac echo guided closure of the patent foramen ovale on 12/10/2020 with implantation of a 25 mm Amplatzer Talisman PFO occluder.   Recommendation: Aspirin  indefinitely, Plavix  3 months, endocarditis prophylaxis 6 months.  Patient to be discharged home today.   Echocardiogram 01/06/2022:  Normal LV systolic function with EF 66%. Left ventricle cavity is normal in size. Normal left ventricular wall thickness. Normal global wall motion. Calculated EF  66%. Left atrial cavity is normal in size. There is a Amplatzer ASO device (25 mm Amplatzer PFO occluder )  noted in the fossa ovalis.  Appears to be in good position.  No thrombus.  There  was suggestion of possible residual shunting across the interatrial septum however double contrast study at rest and with Valsalva reveals no residual shunting. Compared to the study done on 12/10/2020, no significant change.  TCD bubble study 10/28/2022: No HITS at rest. Partial curtain noted during Valsalva. Positive TCD  bubble study indicative of right to left medium sized shunt with Valsalva.  EKG:    EKG 03/04/2023: Normal sinus rhythm at the rate of 58 bpm, normal EKG.  Medications and allergies    No Known Allergies   Current Facility-Administered Medications:    [EXPIRED] 0.9% sodium chloride  infusion, 3 mL/kg/hr, Intravenous, Continuous **FOLLOWED BY** 0.9% sodium chloride  infusion, 1 mL/kg/hr, Intravenous, Continuous, Knox Perl, MD   ceFAZolin  (ANCEF ) IVPB 2g/100 mL premix, 2 g, Intravenous, On Call, Knox Perl, MD   ASSESSMENT AND PLAN: .      ICD-10-CM   1. PFO (patent foramen ovale)  Q21.12 Pregnancy, urine    Pregnancy, urine      Assessment and Plan    Patent Foramen Ovale (PFO)   Residual PFO persists despite previous closure attempts, as shown by the transcranial bubble study from September 2024. There is a risk of paradoxical embolism and stroke due to the residual shunt, necessitating further evaluation and potential closure, especially given her young age of 28. 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  Hence proceeding with ICE guided residual ASD repair would be appropriate.  All questions answered and patient is willing to proceed.  Signed,  Knox Perl, MD, [facility] 06/16/2023, 8:02 AM [facility] [address] [place], [place] [address] Phone: [phone]. Fax:  [phone]

## 2023-06-16 NOTE — Interval H&P Note (Signed)
 History and Physical Interval Note:  06/16/2023 8:51 AM  Shanitra L Borowicz  has presented today for surgery, with the diagnosis of pfo.  The various methods of treatment have been discussed with the patient and family. After consideration of risks, benefits and other options for treatment, the patient has consented to  Procedure(s): PATENT FORAMEN OVALE(PFO) CLOSURE (N/A) as a surgical intervention.  The patient's history has been reviewed, patient examined, no change in status, stable for surgery.  I have reviewed the patient's chart and labs.  Questions were answered to the patient's satisfaction.     Knox Perl

## 2023-06-17 ENCOUNTER — Encounter (HOSPITAL_COMMUNITY): Payer: Self-pay | Admitting: Cardiology

## 2023-06-19 ENCOUNTER — Ambulatory Visit: Payer: Self-pay | Admitting: Neurology

## 2023-06-19 NOTE — Progress Notes (Signed)
 I believe Dr. Berry Bristol already informed the patient about the results of intracardiac catheter echo showing no evidence of any significant residual shunt across the PFO

## 2023-07-13 ENCOUNTER — Ambulatory Visit: Admitting: Cardiology

## 2023-07-26 NOTE — Progress Notes (Unsigned)
 [facility] [address] [place]. [place] [address] [phone]       OFFICE FOLLOW UP NOTE  [name] Date of Birth:  1995/03/30 Medical Record Number:  [id number]    Primary neurologist: Dr. Rosemarie Reason for visit: Headaches, hx of stroke    SUBJECTIVE:   CHIEF COMPLAINT:  No chief complaint on file.   HPI:   Update 07/27/2023 Kristen Grant: Patient returns for follow-up visit.  Unfortunately, she had a miscarriage at [redacted] weeks gestation.    Was seen by Dr. Ladona back in March regarding residual PFO despite prior closure attempts. Underwent TEE in April with stable closure of PFO and no evidence of residual atrial level shunt. Underwent cardiac cath in May with adequate closure of PFO and suspected false positive TCD bubble study.  She was advised to follow-up with cardiology as needed.      History provided for reference purposes only  Update 03/22/2023 Kristen Grant: Patient returns for follow-up visit.  Patient previously seen by Dr. Rosemarie 5 months ago for worsening headaches.  Was started on topiramate  25 mg twice daily.   Patient reports recently finding out that she was pregnant therefore all medications discontinued except for her prenatal vitamins.  She did report some improvement on topiramate  but continued to have headaches about every other day.  Some headaches are associated with photophobia and phonophobia, other headaches can be present upon awakening or in the later evening that is more of a pressure sensation and not associated with photophobia or phonophobia.  She was previously using Aleve as needed but has not used in the past week.  She has her initial prenatal visit today.  Stable from stroke standpoint.  Continue mild left-sided weakness, has been experiencing more left knee pain, no injury, unsure if related to LLE weakness. No new stroke/TIA symptoms.  Aspirin  and atorvastatin  discontinued in setting of pregnancy. TCD bubble 10/2022 showed right  to left medium size shunt with Valsalva. Has not had recent follow-up visit with Dr. Ladona.  Update 10/07/2022 Dr. Rosemarie: She returns for follow-up after last visit a year and a half ago with Kristen Grant nurse practitioner.  Patient states she is doing well from the stroke standpoint without recurrent stroke or TIA symptoms since her initial stroke in September 2022.  She continues to have left hand weakness and left leg stiffness.  Fine motor skills are diminished on the left.  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  She is tolerating aspirin  without side effects.  She has not had recent lab work to check lipid and A1c checked for quite some time.  She has noted increased frequency of her headaches for the last 6 months or so.  Headaches occur 4-5 times a week.  She was previously finding relief with Tylenol  but there is no longer working she is switched to taking Aleve instead.  She has to take 1 or 2 tablets about 4 to 5 days a week for symptomatic relief.  She describes the headaches mostly pressure-like but at times can be severe 8/10 in severity.  She is able to walk most of the time but occasionally has to sit down.  There is some accompanying nausea and light and sound sensitivity.  She does not have to lie down.  She is unable to name specific triggers except physical activities like sex and stress.  She does complain of some muscle  tightness in the back of her neck.  She does not do any neck stretching exercises.  She did have endovascular PFO closure on 12/10/2020 by Dr. Ladona but has not had any follow-up TCD bubble study done.   Update 04/21/2021 Kristen Grant: 28 year old female who returns for stroke follow-up unaccompanied.  Overall doing well since prior visit.  She has since completed therapies reporting residual left hand and toe numbness (especially if gets cold) and mild left hand weakness with  occasional twitching/tremors with fatigue and occasional left foot slapping/dragging with fatigue. She does admit to not routinely doing any exercises as advised by therapies.  She has since returned back to work at [employer] and driving as well as maintaining ADLs and IADLs independently.  Denies new stroke/TIA symptoms.  She underwent PFO closure on 12/10/2020 without complication.  Migraine headaches greatly improved since PFO closure and essentially resolved - only 2 headaches since closure.  She has since stopped topiramate  as no longer needed.  She is closely being followed by psychiatry for depression/anxiety currently on Vistaril , mirtazapine  and venlafaxine .  Compliant on atorvastatin  without side effects. She has since stopped aspirin  as she was told this was no longer needed. She did have some mild bruising and cold sensation while taking. Blood pressure today 117/70.  She has since establish care with PCP Darice Petty, NP and following with OB/GYN.  No further concerns at this time.   Update 12/03/2020 Kristen Grant: Kristen Grant is being seen for initial hospital stroke follow-up unaccompanied. She has been making excellent recovery.  Continues to work with PT/OT at [facility] 2x weekly. Reports continued mild left arm weakness but more so difficulty with fine motor skills. Also reports decreased activity tolerance and complaints of fatigue - notices LLE dragging at that time. Also c/o right lower back pain. Reports speaking with therapy re: AFO brace but has not heard anything else.  Currently on short-term disability which ends 11/6.  She is looking forward to returning back to work at [employer] but she is unsure if she will be abel to return 11/6 as she is scheduled for PFO closure on 11/8. She has since returned back to driving without difficulty. Able to maintain majority of ADLs and IADLs independently. Headaches well controlled on topiramate  25 mg twice daily but ran out of rx 3-4 days ago and has  noticed slight increase in headaches.   Denies new stroke/TIA symptoms.  Reports compliance on aspirin  81 mg daily but recently ran out of atorvastatin . Denies side effects.  Blood pressure today 110/75. Eval by Dr. Ladona on 11/29/2020 for PFO closure - she is scheduled for closure on 12/10/2020. Appt scheduled 11/14 with Tinnie Harada, NP to establish care as PCP.  No further concerns at this time    Stroke admission 10/11/2020 Vennie Salsbury [place] is a 28 y.o. female with PMHx of anxiety, asthma, migraine headaches and on birth control pills who presented on 10/11/2020 with left-sided weakness.  On arrival to ED, slight right gaze deviation, left facial droop, dysarthria and inability to move left side as well as c/o right-sided headache.  Personally reviewed hospitalization pertinent progress notes, lab work and imaging.  Evaluated by Dr. Rosemarie.  Stroke work-up revealed right MCA infarct in setting of right M1 occlusion s/p IV TNKase  and mechanical thrombectomy with TICI 3 revascularization require repeat thrombectomy 24 hours later for reocclusion initiate pain TICI 2B-2C revascularization, infarct embolic secondary to cryptogenic etiology. MRI brain showed right MCA territory infarcts predominantly involving right caudate, putamen,  anterior limb of R IC, insula, operculum and overlying right frontal lobe as well as a few punctate acute infarcts in the right parietal lobe as well as associated cytotoxic edema with slight (2-42mm) leftward midline shift at the foramen of Monro and probable small amount of hemorrhage in the posterior right frontal lobe/insular region.  EF 60 to 65%.  LDL 117.  A1c 5.0.  Hypercoagulable labs negative.  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 LE Dopplers negative for DVT. TEE noted small PFO.  Placed on aspirin  81 mg daily and atorvastatin  80 mg daily.  UDS positive for THC.  Recommended discontinuing birth control pill and to follow-up with OB/GYN for  alternative long-term contraceptive methods.  No prior stroke history.  Therapy eval's recommended CIR for residual left hemiparesis and dysarthria.      PERTINENT IMAGING  Code Stroke  CT head hypodensity at right anterior temporal lobe.   ASPECTS 10.     CTA head & neck:   1. Thrombus in the distal right M1 segment with near complete occlusion. There is reconstitution of flow in superior M2 division but no inferior M2 division is identified. 2. 11 cc infarct core in the right corona radiata, 75 cc penumbra. CT perfusion : CBF (<30%) Volume: 11mL Perfusion (Tmax>6.0s) volume: 86mL Mismatch Volume: 75mL Infarction Location:Right corona radiata Cerebral angio : report from Dr. Dolphus  bilateral common carotid and Lt VA angiograms followed by complete revascularization of occluded LT MCA M1 seg with x pass with contact aspiration and x1 pass with solitaire 3mm x 40 mm retriever and contact aspiration achieving a TICI 3 revascularization. Post CT brain No ICH . 5.MRI BRAIN:   Acute anterior right MCA territory infarct, predominantly involving the right caudate, putamen, anterior limb of the right internal capsule, insula, operculum and overlying right frontal lobe. A few punctate acute infarcts in the right parietal lobe.  Associated cytotoxic edema with slight (2-3 mm) leftward midline shift at the foramen of Monro. Probable small amount of hemorrhage in the posterior right frontal lobe/insular region. 6. 2D Echo LVEF 60-65%, normal LA, poorly visualized interatrial septum  7. 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   Stroke  (HCC)     PSH:  Past Surgical History:  Procedure Laterality Date   BUBBLE STUDY  10/17/2020   Procedure: BUBBLE STUDY;  Surgeon: Jeffrie Oneil BROCKS, MD;  Location: [facility] ENDOSCOPY;  Service: Cardiovascular;;   IR ANGIO INTRA EXTRACRAN SEL COM CAROTID INNOMINATE UNI L MOD SED  10/11/2020   IR ANGIO VERTEBRAL SEL VERTEBRAL UNI L MOD SED  10/11/2020   IR CT HEAD LTD  10/11/2020   IR CT HEAD LTD  10/12/2020   IR PERCUTANEOUS ART THROMBECTOMY/INFUSION INTRACRANIAL INC DIAG ANGIO  10/11/2020   IR PERCUTANEOUS ART THROMBECTOMY/INFUSION INTRACRANIAL INC DIAG ANGIO  10/12/2020   NO PAST SURGERIES     PATENT FORAMEN OVALE(PFO) CLOSURE N/A 12/10/2020   Procedure: PATENT FORAMEN OVALE (PFO) CLOSURE;  Surgeon: Ladona Heinz, MD;  Location: [facility] INVASIVE CV LAB;  Service: Cardiovascular;  Laterality: N/A;   PATENT FORAMEN OVALE(PFO) CLOSURE N/A 06/16/2023   Procedure: PATENT FORAMEN OVALE(PFO) CLOSURE;  Surgeon: Ladona Heinz, MD;  Location: [facility] INVAS[facility] CV LAB;  Service: Cardiovascular;  Laterality: N/A;   RADIOLOGY WITH ANESTHESIA N/A 10/12/2020   Procedure: RADIOLOGY WITH ANESTHESIA;  Surgeon: Radiologist, Medication, MD;  Location: [facility] OR;  Service: Radiology;  Laterality: N/A;   RADIOLOGY WITH ANESTHESIA N/A 10/11/2020   Procedure: IR WITH ANESTHESIA;  Surgeon: Radiologist, Medication, MD;  Location: [facility] OR;  Service: Radiology;  Laterality: N/A;   TEE WITHOUT CARDIOVERSION N/A 10/17/2020   Procedure: TRANSESOPHAGEAL ECHOCARDIOGRAM (TEE);  Surgeon: Jeffrie Oneil BROCKS, MD;  Location: [facility] ENDOSCOPY;  Service: Cardiovascular;  Laterality: N/A;   TRANSESOPHAGEAL ECHOCARDIOGRAM (CATH LAB) N/A 05/05/2023   Procedure: TRANSESOPHAGEAL ECHOCARDIOGRAM;  Surgeon: Mona Vinie BROCKS, MD;  Location: [facility] IN[facility];  Service: Cardiovascular;  Laterality: N/A;    Social History:  Social History   Socioeconomic History   Marital status: Single    Spouse name: everett   Number of children: 1   Years of education: Not on file   Highest  education level: GED or equivalent  Occupational History   Not on file  Tobacco Use   Smoking status: Former    Types: E-cigarettes, Cigars    Start date: 2021   Smokeless tobacco: Never   Tobacco comments:    Per pt, Stopped smoking black and milds and vaping 2022  Vaping Use   Vaping status: Former   Quit date: 01/02/2022   Substances: Nicotine, THC   Devices: Patient tring to quit  Substance and Sexual Activity   Alcohol use: Not Currently    Alcohol/week: 1.0 standard drink of alcohol    Types: 1 Cans of beer per week    Comment: last use 02/2023 wine   Drug use: Not Currently    Frequency: 3.0 times per week    Types: Marijuana    Comment: stopped mj 02/2023   Sexual activity: Yes    Partners: Male    Birth control/protection: None    Comment: depo stopped 2022  Other Topics Concern   Not on file  Social History Narrative   Not on file   Social Drivers of Health   Financial Resource Strain: Not on file  Food Insecurity: No Food Insecurity (03/22/2023)   Hunger Vital Sign    Worried About Running Out of Food in the Last Year: Never true    Ran Out of Food in the Last Year: Never true  Transportation Needs: No Transportation Needs (03/22/2023)   PRAPARE - Administrator, Civil Service (Medical): No    Lack of Transportation (Non-Medical): No  Physical Activity: Not on file  Stress: Not on file  Social Connections: Socially Integrated (03/22/2023)   Social Connection and Isolation Panel    Frequency of Communication with Friends and Family: More than three times a week    Frequency of Social Gatherings with Friends and Family: More than three times a week    Attends Religious Services: More than 4 times per year    Active Member of [employer] or Organizations: Yes    Attends Banker Meetings: More than 4 times per year    Marital Status: Living with partner  Intimate Partner Violence: Not At Risk (03/22/2023)   Humiliation, Afraid, Rape, and  Kick questionnaire    Fear of Current or Ex-Partner: No    Emotionally Abused: No    Physically Abused: No  Sexually Abused: No    Family History:  Family History  Problem Relation Age of Onset   Diabetes Mother    Hypertension Mother    Epilepsy Mother    Anxiety disorder Mother    Depression Mother    Migraines Mother    Diabetes Maternal Grandmother    Stroke Maternal Grandmother    Hypertension Maternal Grandmother    Asthma Half-Brother    Asthma Half-Sister    Breast cancer Neg Hx     Medications:   Current Outpatient Medications on File Prior to Visit  Medication Sig Dispense Refill   aspirin  EC 81 MG tablet Take 1 tablet (81 mg total) by mouth in the morning and at bedtime. Twice daily for 1 month and then 1/day     Cyanocobalamin (RAPID B-12 ENERGY PO) Take 1 tablet by mouth in the morning.     Multiple Vitamin (MULTIVITAMIN WITH MINERALS) TABS tablet Take 1 tablet by mouth in the morning.     oxyCODONE -acetaminophen  (PERCOCET) 7.5-325 MG tablet Take 1 tablet by mouth every 4 (four) hours as needed for severe pain (pain score 7-10) (Groin discomfort). 20 tablet 0   No current facility-administered medications on file prior to visit.    Allergies:  No Known Allergies    OBJECTIVE:  Physical Exam  There were no vitals filed for this visit.  There is no height or weight on file to calculate BMI. No results found.  General: well developed, well nourished, very pleasant young Caucasian female, seated, in no evident distress Head: head normocephalic and atraumatic.   Neck: supple with no carotid or supraclavicular bruits Cardiovascular: regular rate and rhythm, no murmurs Musculoskeletal: no deformity Skin:  no rash/petichiae Vascular:  Normal pulses all extremities   Neurologic Exam Mental Status: Awake and fully alert.  Fluent speech and language.  Oriented to place and time. Recent and remote memory intact. Attention span, concentration and fund of  knowledge appropriate. Mood and affect appropriate.  Cranial Nerves: Pupils equal, briskly reactive to light. Extraocular movements full without nystagmus. Visual fields full to confrontation. Hearing intact. Facial sensation intact. Left nasolabial fold flattening.  Tongue and palate moves normally and symmetrically.  Motor: Normal bulk and tone. Normal strength in all tested extremity muscles except slight decreased left hand dexterity and left ADF weakness Sensory.: intact to touch , pinprick , position and vibratory sensation.  Coordination: Rapid alternating movements normal in all extremities except slightly decreased left hand. Finger-to-nose and heel-to-shin performed accurately bilaterally.  Orbits right arm over left arm. Gait and Station: Arises from chair without difficulty. Stance is normal. Gait demonstrates normal stride length and balance without use of assistive device with slight out turning of left foot. Tandem walk and heel toe with mild difficulty especially with LLE Reflexes: 1+ and symmetric. Toes downgoing.         ASSESSMENT: Kristen Grant is a 28 y.o. year old female with recent right MCA infarcts in setting of right M1 occlusion s/p IV TNKase  and IR with TICI 3 revascularization on 10/11/2020 and repeat thrombectomy 24 hours later for reocclusion of dominant superior division of the right MCA achieving TICI 2B-2C revascularization on 10/12/2020, infarct embolic of cryptogenic etiology. Vascular risk factors include evidence of PFO s/p closure 12/2020, remote migraine headaches, HLD, prior birth control use and THC use.  Returned 10/2022 with complaints of worsening headaches and started on topiramate , prior hx of headaches which significantly improved after PFO closure. She is currently pregnant with positive pregnancy  urine test on 2/12, has initial prenatal visit today.     PLAN:  Headaches: Suspect mixed migrainous and tension type headaches Previously on topiramate   but discontinued in setting of pregnancy Discussed limited treatment options for both preventative and rescue during pregnancy.  Would recommend use of magnesium  oxide 400 mg daily and vitamin B2 200 mg twice daily.  She is currently on prenatal vitamin, unsure which brand, she will send a message via MyChart so current vitamin dosages can be reviewed and make sure dosage recommendations at that time If headaches persist, can consider other preventative options at that time but did discuss risk versus benefit with other medications and lack of safety data in setting of pregnancy.  She was advised typically headaches can improve during pregnancy and hopefully this will be the case for her Advise use of Tylenol  as needed for rescue but no more than 2-3 times per week to avoid rebound headache.  Would not recommend use of Aleve or other NSAID medications during pregnancy but this can be further discussed with her OB/GYN today  R MCA infarcts :  Residual deficit: Mild left hand and foot weakness. Now with left knee pain, possibly due to gait impairment with ADF weakness.  Referral placed to PT in [place] per patient request Currently off aspirin  and atorvastatin  in setting of pregnancy (see below re: Aspirin ) Continue close PCP follow-up for aggressive stroke risk factor management  PFO:  s/p repair 12/10/2020.   2D echo 01/2022 no residual shunting.   TCD bubble 10/2022 showed right to left medium size shunt with Valsalva.   Recommend follow-up with cardiology Dr. Ladona for further evaluation and discuss safety of restarting aspirin , office number provided     Follow-up in 4 to 6 weeks or call earlier if needed    CC:  PCP: Patient, No Pcp Per    I personally spent a total of *** minutes in the care of the patient today including {Time Based Coding:210964241}.  Kristen Grant Bogaert, AGNP-BC  [facility] [address] [place], [place] [address]  Phone  [phone] Fax [phone] Note: This document was prepared with digital dictation and possible smart phrase technology. Any transcriptional errors that result from this process are unintentional.

## 2023-07-27 ENCOUNTER — Encounter: Payer: Self-pay | Admitting: Adult Health

## 2023-07-27 ENCOUNTER — Ambulatory Visit: Payer: Managed Care, Other (non HMO) | Admitting: Adult Health

## 2023-07-27 VITALS — BP 114/72 | HR 67 | Ht 69.0 in | Wt 183.0 lb

## 2023-07-27 DIAGNOSIS — R269 Unspecified abnormalities of gait and mobility: Secondary | ICD-10-CM

## 2023-07-27 DIAGNOSIS — G8194 Hemiplegia, unspecified affecting left nondominant side: Secondary | ICD-10-CM | POA: Diagnosis not present

## 2023-07-27 DIAGNOSIS — R42 Dizziness and giddiness: Secondary | ICD-10-CM

## 2023-07-27 DIAGNOSIS — G44209 Tension-type headache, unspecified, not intractable: Secondary | ICD-10-CM

## 2023-07-27 DIAGNOSIS — I69398 Other sequelae of cerebral infarction: Secondary | ICD-10-CM

## 2023-07-27 DIAGNOSIS — Q2112 Patent foramen ovale: Secondary | ICD-10-CM

## 2023-07-27 DIAGNOSIS — I63511 Cerebral infarction due to unspecified occlusion or stenosis of right middle cerebral artery: Secondary | ICD-10-CM

## 2023-07-27 DIAGNOSIS — G43909 Migraine, unspecified, not intractable, without status migrainosus: Secondary | ICD-10-CM

## 2023-07-27 MED ORDER — MECLIZINE HCL 12.5 MG PO TABS: 12.5000 mg | ORAL_TABLET | Freq: Two times a day (BID) | ORAL | 5 refills | Status: DC | PRN

## 2023-07-27 NOTE — Patient Instructions (Addendum)
 Your Plan:  Start PT for vertigo (vestibular rehab) - you will be called to schedule  If vertigo persists, we can consider completing further work up such as MRI brain  Start meclizine as needed for vertigo - can use up to twice per day  Continue aspirin  81mg  daily for stroke prevention  Please call once you have insurance and we can pursue lab work   Please call if your headaches should worsen      Follow up in 6 months or call earlier if needed     Thank you for coming to see us  at [facility]. I hope we have been able to provide you high quality care today.  You may receive a patient satisfaction survey over the next few weeks. We would appreciate your feedback and comments so that we may continue to improve ourselves and the health of our patients.     Vertigo Vertigo is the feeling that you or the things around you are moving or spinning when they're not. It's different than feeling dizzy. It can also cause: Loss of balance. Trouble standing or walking. Nausea and vomiting. This feeling can come and go at any time. It can last from a few seconds to minutes or even hours. It may go away on its own or be treated with medicine. What are the types of vertigo? There are two types of vertigo: Peripheral vertigo happens when parts of your inner ear don't work like they should. This is the more common type. Central vertigo happens when your brain and spinal cord don't work like they should. Your health care provider will do tests to find out what kind of vertigo you have. This will help them decide on the right treatment for you. Follow these instructions at home: Eating and drinking Drink enough fluid to keep your pee (urine) pale yellow. Do not drink alcohol. Activity When you get up in the morning, first sit up on the side of the bed. When you feel okay, stand slowly while holding onto something. Move slowly. Avoid sudden body or head movements. Avoid  certain positions, as told by your provider. Use a cane if you have trouble standing or walking. Sit down right away if you feel unsteady. Place items in your home so they're easy for you to reach without bending or leaning over. Return to normal activities when you're told. Ask what things are safe for you to do. General instructions Take your medicines only as told by your provider. Contact a health care provider if: Your medicines don't help or make your vertigo worse. You get new symptoms. You have a fever. You have nausea or vomiting. Your family or friends spot any changes in how you're acting. A part of your body goes numb. You feel tingling and prickling in a part of your body. You get very bad headaches. Get help right away if: You're always dizzy or you faint. You have a stiff neck. You have trouble moving or speaking. Your hands, arms, or legs feel weak. Your hearing or eyesight changes. These symptoms may be an emergency. Call 911 right away. Do not wait to see if the symptoms will go away. Do not drive yourself to the hospital. This information is not intended to replace advice given to you by your health care provider. Make sure you discuss any questions you have with your health care provider. Document Revised: 10/22/2022 Document Reviewed: 04/24/2022 Elsevier Patient Education  2024 [employer].

## 2023-08-29 ENCOUNTER — Other Ambulatory Visit: Payer: Self-pay

## 2023-08-29 ENCOUNTER — Emergency Department

## 2023-08-29 ENCOUNTER — Emergency Department: Admission: EM | Admit: 2023-08-29 | Discharge: 2023-08-29 | Disposition: A | Discharge status: Home / Self Care

## 2023-08-29 DIAGNOSIS — R519 Headache, unspecified: Secondary | ICD-10-CM | POA: Insufficient documentation

## 2023-08-29 DIAGNOSIS — Z3201 Encounter for pregnancy test, result positive: Secondary | ICD-10-CM | POA: Insufficient documentation

## 2023-08-29 LAB — COMPREHENSIVE METABOLIC PANEL WITH GFR
ALT: 17 U/L (ref 0–44)
AST: 22 U/L (ref 15–41)
Albumin: 4 g/dL (ref 3.5–5.0)
Alkaline Phosphatase: 36 U/L — ABNORMAL LOW (ref 38–126)
Anion gap: 10 (ref 5–15)
BUN: 8 mg/dL (ref 6–20)
CO2: 23 mmol/L (ref 22–32)
Calcium: 9.4 mg/dL (ref 8.9–10.3)
Chloride: 103 mmol/L (ref 98–111)
Creatinine, Ser: 0.56 mg/dL (ref 0.44–1.00)
GFR, Estimated: >60 mL/min (ref >60)
Glucose, Bld: 89 mg/dL (ref 70–99)
Potassium: 4 mmol/L (ref 3.5–5.1)
Sodium: 136 mmol/L (ref 135–145)
Total Bilirubin: 2.3 mg/dL — ABNORMAL HIGH (ref 0.0–1.2)
Total Protein: 7 g/dL (ref 6.5–8.1)

## 2023-08-29 LAB — CBC WITH DIFFERENTIAL/PLATELET
Abs Immature Granulocytes: 0.02 K/uL (ref 0.00–0.07)
Basophils Absolute: 0.1 K/uL (ref 0.0–0.1)
Basophils Relative: 1 %
Eosinophils Absolute: 0.2 K/uL (ref 0.0–0.5)
Eosinophils Relative: 2 %
HCT: 37.9 % (ref 36.0–46.0)
Hemoglobin: 13.3 g/dL (ref 12.0–15.0)
Immature Granulocytes: 0 %
Lymphocytes Relative: 23 %
Lymphs Abs: 2 K/uL (ref 0.7–4.0)
MCH: 31.4 pg (ref 26.0–34.0)
MCHC: 35.1 g/dL (ref 30.0–36.0)
MCV: 89.6 fL (ref 80.0–100.0)
Monocytes Absolute: 0.9 K/uL (ref 0.1–1.0)
Monocytes Relative: 10 %
Neutro Abs: 5.8 K/uL (ref 1.7–7.7)
Neutrophils Relative %: 64 %
Platelets: 247 K/uL (ref 150–400)
RBC: 4.23 MIL/uL (ref 3.87–5.11)
RDW: 12.3 % (ref 11.5–15.5)
WBC: 8.9 K/uL (ref 4.0–10.5)
nRBC: 0 % (ref 0.0–0.2)

## 2023-08-29 LAB — HCG, QUANTITATIVE, PREGNANCY: hCG, Beta Chain, Quant, S: 25177 m[IU]/mL — ABNORMAL HIGH (ref <5)

## 2023-08-29 MED ORDER — ACETAMINOPHEN 325 MG PO TABS: 650.0000 mg | ORAL_TABLET | Freq: Once | ORAL | Status: DC

## 2023-08-29 MED ORDER — ACETAMINOPHEN 500 MG PO TABS
1000.0000 mg | ORAL_TABLET | Freq: Once | ORAL | Status: AC
  Administered 2023-08-29: 1000 mg via ORAL
  Filled 2023-08-29: qty 2

## 2023-08-29 MED ORDER — PRENATAL PLUS VITAMIN/MINERAL 27-1 MG PO TABS: 1.0000 | ORAL_TABLET | Freq: Every day | ORAL | 0 refills | Status: AC

## 2023-08-29 MED ORDER — SODIUM CHLORIDE 0.9 % IV BOLUS
1000.0000 mL | Freq: Once | INTRAVENOUS | Status: AC
  Administered 2023-08-29: 1000 mL via INTRAVENOUS

## 2023-08-29 MED ORDER — METOCLOPRAMIDE HCL 5 MG/ML IJ SOLN
5.0000 mg | Freq: Once | INTRAMUSCULAR | Status: DC
  Filled 2023-08-29: qty 2

## 2023-08-29 MED ORDER — SODIUM CHLORIDE 0.9 % IV BOLUS
500.0000 mL | Freq: Once | INTRAVENOUS | Status: AC
  Administered 2023-08-29: 500 mL via INTRAVENOUS

## 2023-08-29 MED ORDER — ACETAMINOPHEN 500 MG PO TABS: 1000.0000 mg | ORAL_TABLET | Freq: Four times a day (QID) | ORAL | 2 refills | Status: AC | PRN

## 2023-08-29 MED ORDER — LORAZEPAM 2 MG/ML IJ SOLN
0.5000 mg | Freq: Once | INTRAMUSCULAR | Status: DC
  Filled 2023-08-29: qty 1

## 2023-08-29 MED ORDER — METOCLOPRAMIDE HCL 5 MG/ML IJ SOLN
10.0000 mg | Freq: Once | INTRAMUSCULAR | Status: AC
  Administered 2023-08-29: 10 mg via INTRAVENOUS
  Filled 2023-08-29: qty 2

## 2023-08-29 MED ORDER — DIPHENHYDRAMINE HCL 50 MG/ML IJ SOLN
12.5000 mg | Freq: Once | INTRAMUSCULAR | Status: AC
  Administered 2023-08-29: 12.5 mg via INTRAVENOUS
  Filled 2023-08-29: qty 1

## 2023-08-29 MED ORDER — SODIUM CHLORIDE 0.9 % IV BOLUS: 1000.0000 mL | Freq: Once | INTRAVENOUS | Status: DC

## 2023-08-29 NOTE — ED Provider Notes (Signed)
  Physical Exam  BP 102/62   Pulse 84   Temp 98.4 F (36.9 C) (Oral)   Resp 16   Ht 5' 9 (1.753 m)   Wt 84.4 kg   LMP  (Approximate)   SpO2 100%   BMI 27.47 kg/m   Physical Exam  Procedures  Procedures  ED Course / MDM   Clinical Course as of 08/29/23 1835  Sun Aug 29, 2023  1441 Notify the patient's urine I was notified that patient's urine pregnancy test was positive.  I did let the patient and her significant other know. We will confirm with a blood pregnancy test. [HD]  1455 1. No evidence of acute intracranial abnormality. 2. Chronic right MCA infarct. 3. Right sphenoid sinus fluid, correlate for acute sinusitis.  Patient denies any right facial pain or nasal drip [HD]  1505 Total Bilirubin(!): 2.3 This is slightly more elevated than previous with no old to compare however patient has no abdominal tenderness to palpation and may be related to vomiting in pregnancy [HD]  1514 S/o from Dr. Nicholaus: 56F pmh prior cva w/ thrombectomy x2 - L sided deficits at baseline - on ASA - p/w HA, has hx of HA - no new focal neuro deficit - otc tylenol  w/ minimal relief - +urine hcg, formal quant pending - some nausea - ab benign, mildly elevated bili [MM]  1716 Patient reevaluated, notes headache is feeling much better.  Does still have some mild residual headache.  He is requesting further IV fluid, will also give Reglan .  Given patient is feeling notably better, no indication for MRI at this time in accordance with signout plan.  Will give further 500 cc IV fluid, Reglan , tentative plan for discharge home. [MM]  1827 Patient notes she is feeling much better after further fluid and Reglan  and is requesting discharge home.  No evidence of acute pathology at this time, will plan for discharge home in agreement with signout plan.  Will prescribe prenatal vitamin.  Denies any lower abdominal pain, vaginal bleeding.  Will follow-up with her OB/GYN provider.  Recommend Tylenol  as needed  for headache as well as her hydration.  ED return precautions in place.  Patient agrees with plan. [MM]    Clinical Course User Index [HD] Nicholaus Rolland BRAVO, MD [MM] Clarine Ozell LABOR, MD   Medical Decision Making Amount and/or Complexity of Data Reviewed Labs: ordered. Decision-making details documented in ED Course. Radiology: ordered.  Risk OTC drugs. Prescription drug management.          Clarine Ozell LABOR, MD 08/29/23 [phone]

## 2023-08-29 NOTE — Discharge Instructions (Addendum)
 Your evaluation in the emergency department was overall reassuring.  Your pregnancy test was positive, and I have started you on a prenatal vitamin to take daily.  Please do follow-up with your OB/GYN provider for reevaluation.  You can use Tylenol  as needed for any recurrent headache, and I recommend drinking plenty of water daily.  Please follow-up with your primary care provider as well, and return to the emergency department with any new or worsening symptoms.

## 2023-08-29 NOTE — ED Provider Notes (Signed)
 [facility] Provider Note    Event Date/Time   First MD Initiated Contact with Patient 08/29/23 1345     (approximate)   History   Headache   HPI  Kristen Grant is a 28 y.o. 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  Patient states that she has a long history of migraines but this is the worst than previous.  It is not resolved with over-the-counter Tylenol .,  Of which she last took a dose yesterday afternoon.  There is associated nausea.  She denies any new facial extremity weakness or numbness.  She denies any hearing or vision changes.  Headache initiated along the right temple and radiated across the forehead.  The only medication she takes as an outpatient is a daily 81 mg aspirin  and meclizine  as needed.   Physical Exam   Triage Vital Signs: ED Triage Vitals  Encounter Vitals Group     BP 08/29/23 1335 139/69     Girls Systolic BP Percentile --      Girls Diastolic BP Percentile --      Boys Systolic BP Percentile --      Boys Diastolic BP Percentile --      Pulse Rate 08/29/23 1335 81     Resp 08/29/23 1335 20     Temp 08/29/23 1335 98.4 F (36.9 C)     Temp Source 08/29/23 1335 Oral     SpO2 08/29/23 1335 100 %     Weight 08/29/23 1338 186 lb (84.4 kg)     Height 08/29/23 1338 5' 9 (1.753 m)     Head Circumference --      Peak Flow --      Pain Score 08/29/23 1334 6     Pain Loc --      Pain Education --      Exclude from Growth Chart --     Most recent vital signs: Vitals:   08/29/23 1335  BP: 139/69  Pulse: 81  Resp: 20  Temp: 98.4 F (36.9 C)  SpO2: 100%    Nursing Triage Note reviewed. Vital signs reviewed and patients oxygen saturation is normoxic  General: Patient is well nourished, well developed, awake and alert, appears  uncomfortable Head: Normocephalic and atraumatic Eyes: Normal inspection, extraocular muscles intact, no conjunctival pallor Ear, nose, throat: Normal external exam Neck: Normal range of motion Respiratory: Patient is in no respiratory distress, lungs CTAB Cardiovascular: Patient is not tachycardic, RRR without murmur appreciated GI: Abd SNT with no guarding or rebound  Back: Normal inspection of the back with good strength and range of motion throughout all ext Extremities: pulses intact with good cap refills, no LE pitting edema or calf tenderness Neuro: The patient is alert and oriented to person, place, and time, appropriately conversive,  She has a weak left marginal mandibular droop which patient states is chronic.  Strength is 4 out of 5 on the left extremities but sensation is grossly intact.  There are no deficits on the right.  Patient ambulates without ataxia.  Visual fields are intact bilaterally Skin: Warm, dry, and intact Psych: normal mood and affect, no SI or HI  ED Results / Procedures / Treatments   Labs (all labs ordered are listed, but only abnormal results are displayed) Labs Reviewed  COMPREHENSIVE METABOLIC PANEL WITH GFR -  Abnormal; Notable for the following components:      Result Value   Alkaline Phosphatase 36 (*)    Total Bilirubin 2.3 (*)    All other components within normal limits  CBC WITH DIFFERENTIAL/PLATELET  HCG, QUANTITATIVE, PREGNANCY     EKG   RADIOLOGY CT head without contrast: No intracranial hemorrhage on my independent review and interpretation radiologist agrees    PROCEDURES:  Critical Care performed: No  Procedures   MEDICATIONS ORDERED IN ED: Medications  acetaminophen  (TYLENOL ) tablet 1,000 mg (has no administration in time range)  diphenhydrAMINE  (BENADRYL ) injection 12.5 mg (has no administration in time range)  sodium chloride  0.9 % bolus 1,000 mL (1,000 mLs Intravenous New Bag/Given 08/29/23 1501)     IMPRESSION /  MDM / ASSESSMENT AND PLAN / ED COURSE                                Differential diagnosis includes, but is not limited to: Intracranial hemorrhage, migraine, pregnancy, less likely recurrent CVA given chronicity of symptoms and lack of new focal neurological deficits  ED course: Patient is adamant that symptoms have been ongoing for 4 days and she has no new focal neurological deficits both by report and on exam.  Her prior history however is very concerning.  CT head was obtained which demonstrated no intracranial hemorrhage.  Patient's pregnancy test surprisingly came back positive.  Per patient this would be a desired pregnancy although she has had prior miscarriages and 1 live birth.  Medication choice subsequently limited to fluids, Tylenol  and Benadryl .  Plan for reassessment after medications. HCG is pending.  Per patient she has only missed 1 menses so suspect this would be an early pregnancy   Clinical Course as of 08/29/23 1506  Sun Aug 29, 2023  1441 Notify the patient's urine I was notified that patient's urine pregnancy test was positive.  I did let the patient and her significant other know. We will confirm with a blood pregnancy test. [HD]  1455 1. No evidence of acute intracranial abnormality. 2. Chronic right MCA infarct. 3. Right sphenoid sinus fluid, correlate for acute sinusitis.  Patient denies any right facial pain or nasal drip [HD]  1505 Total Bilirubin(!): 2.3 This is slightly more elevated than previous with no old to compare however patient has no abdominal tenderness to palpation and may be related to vomiting in pregnancy [HD]    Clinical Course User Index [HD] Nicholaus Rolland BRAVO, MD   -- Risk: 5 This patient has a high risk of morbidity due to further diagnostic testing or treatment. Rationale: This patient's evaluation and management involve a high risk of morbidity due to the potential severity of presenting symptoms, need for diagnostic testing, and/or  initiation of treatment that may require close monitoring. The differential includes conditions with potential for significant deterioration or requiring escalation of care. Treatment decisions in the ED, including medication administration, procedural interventions, or disposition planning, reflect this level of risk. Additional Support: -- Drug therapy requiring intensive monitoring for toxicity [ ]  -- Decision regarding elective major surgery with idenitified patient or procedure risk factors [ ]  -- Decision regarding hospitalization or escalation of hospital-level care [ ]  -- Decision not to resuscitate or to de-escalate care because of poor prognosis [ ]  -- Parental controlled substances [ ]   COPA: 5 The patient has a severe exacerbation, progression, or side effect of treatment of the following illness/illnesses: []  OR  The patient has the following acute or chronic illness/injury that poses a possible threat to life or bodily function: [X] : The patient has a potentially serious acute condition or an acute exacerbation of a chronic illness requiring urgent evaluation and management in the Emergency Department. The clinical presentation necessitates immediate consideration of life-threatening or function-threatening diagnoses, even if they are ultimately ruled out.  Data(2/3 categories following were performed): 5 I reviewed or ordered at least three unique tests, external notes, and/or the history required an independent historian as one of the three requirements as following: cbc, poc pregnancy, CMP AND  I independently interpreted the following test: CT head OR  I discussed the management of the patient with the following external physician or qualified healthcare provider: []     Suggested E/M Coding Level: 5, 99285, This has been selected based on the 2021/10/01 CPT guidelines for E/M codes in the Emergency Department based on 2/3 of the CoPA, Data, and Risk.   FINAL CLINICAL  IMPRESSION(S) / ED DIAGNOSES   Final diagnoses:  Positive pregnancy test  Intractable headache, unspecified chronicity pattern, unspecified headache type     Rx / DC Orders   ED Discharge Orders     None        Note:  This document was prepared using Dragon voice recognition software and may include unintentional dictation errors.   Nicholaus Rolland BRAVO, MD 08/29/23 [phone]

## 2023-08-29 NOTE — ED Triage Notes (Signed)
 Pt to ED for throbbing frontal HA since Thursday, started on R side, now bilateral. Hx migraines and 2 strokes 9/22 with residual L face, arm and leg weakness. Takes 81mg  aspirin  daily.  Vomited this AM. No vision changes. No photosensitivity.

## 2023-08-30 ENCOUNTER — Encounter: Payer: Self-pay | Admitting: Adult Health

## 2023-08-31 ENCOUNTER — Telehealth: Payer: Self-pay

## 2023-08-31 DIAGNOSIS — Z8759 Personal history of other complications of pregnancy, childbirth and the puerperium: Secondary | ICD-10-CM

## 2023-08-31 NOTE — Telephone Encounter (Signed)
 Dating scan order placed for outpatient scheduling. Patient aware and giving [phone] option 3 to contact for scheduling. She will call us  back to let us  know when she is scheduled so we can watch for results and follow up with next steps.

## 2023-08-31 NOTE — Telephone Encounter (Signed)
 Advised per Allena J at front desk, patient is on the phone, was confirmed pregnant at ED on 08/29/23 by Beta hcg 25,177. Patient is uncertain on LMP as she has had irregular bleeding since miscarriage in March 2025. Verified with Damien Parsley, CNM who last saw patient. Ok to order dating scan to confirm viability/dates prior to scheduling NOB appointments.

## 2023-09-06 ENCOUNTER — Ambulatory Visit
Admission: RE | Admit: 2023-09-06 | Discharge: 2023-09-06 | Disposition: A | Discharge status: Home / Self Care | Source: Ambulatory Visit | Attending: Certified Nurse Midwife | Admitting: Certified Nurse Midwife

## 2023-09-06 DIAGNOSIS — Z8759 Personal history of other complications of pregnancy, childbirth and the puerperium: Secondary | ICD-10-CM | POA: Insufficient documentation

## 2023-09-06 DIAGNOSIS — O3680X0 Pregnancy with inconclusive fetal viability, not applicable or unspecified: Secondary | ICD-10-CM | POA: Diagnosis present

## 2023-09-17 ENCOUNTER — Telehealth (INDEPENDENT_AMBULATORY_CARE_PROVIDER_SITE_OTHER)

## 2023-09-17 DIAGNOSIS — Z3480 Encounter for supervision of other normal pregnancy, unspecified trimester: Secondary | ICD-10-CM

## 2023-09-17 DIAGNOSIS — Z3689 Encounter for other specified antenatal screening: Secondary | ICD-10-CM

## 2023-09-17 NOTE — Progress Notes (Signed)
 New OB Intake  I connected with  Kristen Grant on 09/17/23 at  8:15 AM EDT by MyChart Video Visit and verified that I am speaking with the correct person using two identifiers. Nurse is located at [facility] and pt is located at home.  I discussed the limitations, risks, security and privacy concerns of performing an evaluation and management service by telephone and the availability of in person appointments. I also discussed with the patient that there may be a patient responsible charge related to this service. The patient expressed understanding and agreed to proceed.  I explained I am completing New OB Intake today. We discussed her EDD of 04/23/24 that is based on ultrasound Pt is G3/P1. I reviewed her allergies, medications, Medical/Surgical/OB history, and appropriate screenings. There are cats in the home: yes. If yes: is not changing the litter box .History is significant for having  issues with blood pressure in prevoise pregnancy and  patient has history of stokes.   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   History of opioid abuse (HCC) 03/24/2021   History of stroke x2 10/2020 12/16/2020   Depression, major, single episode, moderate (HCC) 12/16/2020   PFO (patent foramen ovale) 12/09/2020   Anemia    Hemiparesis affecting left side as late effect of stroke (HCC)    Leukocytosis    Dyslipidemia    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  Anatomy US  Anatomy US  will be scheduled around [redacted] weeks gestational age.  Labs Discussed genetic screening with patient. Patient would like genetic testing to be drawn at new OB visit. Discussed possible labs to be drawn at new OB appointment.  COVID Vaccine Patient has not had COVID vaccine.   Social Determinants of Health Food Insecurity: denies food insecurity WIC Referral: Patient is interested in referral to [facility].  Transportation: Patient denies transportation needs. Childcare: Discussed no children allowed at ultrasound appointments.   First visit review I reviewed new OB appt with pt. I explained she will have blood work and pap smear/pelvic exam if indicated. Explained pt will be seen by lydia dominic CNM  at first visit; encounter routed to appropriate provider.   Kristen Grant, CMA 09/17/2023  9:01 AM

## 2023-10-12 ENCOUNTER — Encounter: Payer: Self-pay | Admitting: Licensed Practical Nurse

## 2023-10-12 ENCOUNTER — Other Ambulatory Visit (HOSPITAL_COMMUNITY)
Admission: RE | Admit: 2023-10-12 | Discharge: 2023-10-12 | Disposition: A | Discharge status: Home / Self Care | Source: Ambulatory Visit | Attending: Licensed Practical Nurse | Admitting: Licensed Practical Nurse

## 2023-10-12 ENCOUNTER — Ambulatory Visit: Admitting: Licensed Practical Nurse

## 2023-10-12 VITALS — BP 103/62 | HR 73 | Wt 178.9 lb

## 2023-10-12 DIAGNOSIS — Z3A12 12 weeks gestation of pregnancy: Secondary | ICD-10-CM | POA: Diagnosis not present

## 2023-10-12 DIAGNOSIS — Z369 Encounter for antenatal screening, unspecified: Secondary | ICD-10-CM

## 2023-10-12 DIAGNOSIS — Z124 Encounter for screening for malignant neoplasm of cervix: Secondary | ICD-10-CM | POA: Diagnosis present

## 2023-10-12 DIAGNOSIS — Z113 Encounter for screening for infections with a predominantly sexual mode of transmission: Secondary | ICD-10-CM | POA: Diagnosis present

## 2023-10-12 DIAGNOSIS — Z3401 Encounter for supervision of normal first pregnancy, first trimester: Secondary | ICD-10-CM

## 2023-10-12 DIAGNOSIS — O219 Vomiting of pregnancy, unspecified: Secondary | ICD-10-CM

## 2023-10-12 DIAGNOSIS — Z1379 Encounter for other screening for genetic and chromosomal anomalies: Secondary | ICD-10-CM

## 2023-10-12 MED ORDER — ONDANSETRON 4 MG PO TBDP: 4.0000 mg | ORAL_TABLET | Freq: Four times a day (QID) | ORAL | 3 refills | Status: DC | PRN

## 2023-10-12 NOTE — Progress Notes (Signed)
 NEW OB HISTORY AND PHYSICAL  SUBJECTIVE:       Kristen Grant is a 28 y.o. [id number] female, No LMP recorded (lmp unknown). Patient is pregnant., Estimated Date of Delivery: 04/23/24, [redacted]w[redacted]d, presents today for establishment of Prenatal Care. This pregnancy is a surprise, she was not using contraception at the time. She admits to feeling excited and nervous about the pregnancy.  She had an US  on 8/4 that showed a SIUP at [redacted]w[redacted]d  She reports fatigue  HA, nausea, dizziness   Social history Partner/Relationship:partner, Everet  Living situation:Lives with her son and Danise feels safe. Everet has a 6y/o child Work: currently unemployed  Exercise: none  Substance ldz:unajrrn and MJ use stopped once pregnant   -SVB x1 at 41wks, no complications, may have had PPD, tried to breastfeed but it was painful,  -Asthma as a child -Depression/anxiety has tried therapy and medication which did not help  -HX of stroke x 2   Indications for ASA therapy (per uptodate) One of the following: Previous pregnancy with preeclampsia, especially early onset and with an adverse outcome No Multifetal gestation No Chronic hypertension No Type 1 or 2 diabetes mellitus No Chronic kidney disease No Autoimmune disease (antiphospholipid syndrome, systemic lupus erythematosus) No    Two or more of the following: Nulliparity No Obesity (body mass index >30 kg/m2) No Family history of preeclampsia in mother or sister No Age >=35 years No Sociodemographic characteristics (African American race, low socioeconomic level) No Personal risk factors (eg, previous pregnancy with low birth weight or small for gestational age infant, previous adverse pregnancy outcome [eg, stillbirth], interval >10 years between pregnancies) No   Gynecologic History No LMP recorded (lmp unknown). Patient is pregnant. Unknown Contraception: none Last Pap: colpo 2024-negative,   Obstetric History OB History  Gravida Para Term Preterm AB  Living  3 1 1  0 1 1  SAB IAB Ectopic Multiple Live Births  1 0 0 0 1    # Outcome Date GA Lbr Len/2nd Weight Sex Type Anes PTL Lv  3 Current           2 SAB 04/08/23 [redacted]w[redacted]d         1 Term 08/24/15 [redacted]w[redacted]d / 00:17 8 lb 3 oz (3.714 kg) M Vag-Spont None  LIV    Past Medical History:  Diagnosis Date   Anxiety    Asthma    no treatments required at this time   Chlamydia 2023   Depression    Gonorrhea 2024   Migraine headache    Nicotine vapor product user 10/06/2019   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   Stroke (HCC)    hx 2 ischemic strokesin September 2022--residual weakness to L face, arm and leg    Past Surgical History:  Procedure Laterality Date   BUBBLE STUDY  10/17/2020   Procedure: BUBBLE STUDY;  Surgeon: Jeffrie Oneil BROCKS, MD;  Location: [facility];  Service: Cardiovascular;;   IR ANGIO INTRA EXTRACRAN SEL COM CAROTID INNOMINATE UNI L MOD SED  10/11/2020   IR ANGIO VERTEBRAL SEL VERTEBRAL UNI L MOD SED  10/11/2020   IR CT HEAD LTD  10/11/2020   IR CT HEAD LTD  10/12/2020   IR PERCUTANEOUS ART THROMBECTOMY/INFUSION INTRACRANIAL INC DIAG ANGIO  10/11/2020   IR PERCUTANEOUS ART THROMBECTOMY/INFUSION INTRACRANIAL INC DIAG ANGIO  10/12/2020   NO PAST SURGERIES     PATENT FORAMEN OVALE(PFO) CLOSURE N/A 12/10/2020   Procedure: PATENT FORAMEN OVALE (PFO) CLOSURE;  Surgeon: Ladona Heinz, MD;  Location: [facility];  Service: Cardiovascular;  Laterality: N/A;   PATENT FORAMEN OVALE(PFO) CLOSURE N/A 06/16/2023   Procedure: PATENT FORAMEN OVALE(PFO) CLOSURE;  Surgeon: Ladona Heinz, MD;  Location: [facility];  Service: Cardiovascular;  Laterality: N/A;   RADIOLOGY WITH ANESTHESIA N/A 10/12/2020   Procedure: RADIOLOGY WITH ANESTHESIA;  Surgeon: Radiologist, Medication, MD;  Location: [facility] OR;  Service: Radiology;  Laterality: N/A;   RADIOLOGY WITH ANESTHESIA N/A 10/11/2020   Procedure: IR WITH ANESTHESIA;  Surgeon: Radiologist, Medication, MD;  Location: [facility] OR;  Service:  Radiology;  Laterality: N/A;   TEE WITHOUT CARDIOVERSION N/A 10/17/2020   Procedure: TRANSESOPHAGEAL ECHOCARDIOGRAM (TEE);  Surgeon: Jeffrie Oneil BROCKS, MD;  Location[facility] ENDOSCOPY;  Service: Cardiovascular;  Laterality: N/A;   TRANSESOPHAGEAL ECHOCARDIOGRAM (CATH LAB) N/A 05/05/2023   Procedure: TRANSESOPHAGEAL ECHOCARDIOGRAM;  Surgeon: Mona Vinie BROCKS, MD;  Location: [facility];  Service: Cardiovascular;  Laterality: N/A;    Current Outpatient Medications on File Prior to Visit  Medication Sig Dispense Refill   acetaminophen  (TYLENOL ) 500 MG tablet Take 2 tablets (1,000 mg total) by mouth every 6 (six) hours as needed. 100 tablet 2   Prenatal Vit-Fe Fumarate-FA (PRENATAL PLUS VITAMIN/MINERAL) 27-1 MG TABS Take 1 tablet by mouth daily. 90 tablet 0   aspirin  EC 81 MG tablet Take 1 tablet (81 mg total) by mouth in the morning and at bedtime. Twice daily for 1 month and then 1/day     Cyanocobalamin (RAPID B-12 ENERGY PO) Take 1 tablet by mouth in the morning.     meclizine  (ANTIVERT ) 12.5 MG tablet Take 1 tablet (12.5 mg total) by mouth 2 (two) times daily as needed for dizziness. 30 tablet 5   Multiple Vitamin (MULTIVITAMIN WITH MINERALS) TABS tablet Take 1 tablet by mouth in the morning.     No current facility-administered medications on file prior to visit.    No Known Allergies  Social History   Socioeconomic History   Marital status: Single    Spouse name: Armed forces operational officer   Number of children: 1   Years of education: Not on file   Highest education level: GED or equivalent  Occupational History   Not on file  Tobacco Use   Smoking status: Former    Types: E-cigarettes, Cigars    Start date: 2021   Smokeless tobacco: Never   Tobacco comments:    Per pt, Stopped smoking black and milds and vaping 2022  Vaping Use   Vaping status: Former   Quit date: 01/02/2022   Substances: Nicotine, THC   Devices: Patient tring to quit  Substance and Sexual Activity   Alcohol use: Not  Currently    Alcohol/week: 1.0 standard drink of alcohol    Types: 1 Cans of beer per week    Comment: last use 02/2023 wine   Drug use: Not Currently    Types: Marijuana   Sexual activity: Yes    Partners: Male    Birth control/protection: None    Comment: depo stopped 2022  Other Topics Concern   Not on file  Social History Narrative   Not on file   Social Drivers of Health   Financial Resource Strain: Low Risk  (09/17/2023)   Overall Financial Resource Strain (CARDIA)    Difficulty of Paying Living Expenses: Not hard at all  Food Insecurity: No Food Insecurity (09/17/2023)   Hunger Vital Sign    Worried About Running Out of Food in the Last Year: Never true  Ran Out of Food in the Last Year: Never true  Transportation Needs: No Transportation Needs (09/17/2023)   PRAPARE - Administrator, Civil Service (Medical): No    Lack of Transportation (Non-Medical): No  Physical Activity: Inactive (09/17/2023)   Exercise Vital Sign    Days of Exercise per Week: 0 days    Minutes of Exercise per Session: 0 min  Stress: No Stress Concern Present (09/17/2023)   [employer] of Occupational Health - Occupational Stress Questionnaire    Feeling of Stress: Only a little  Social Connections: Socially Integrated (03/22/2023)   Social Connection and Isolation Panel    Frequency of Communication with Friends and Family: More than three times a week    Frequency of Social Gatherings with Friends and Family: More than three times a week    Attends Religious Services: More than 4 times per year    Active Member of [employer] or Organizations: Yes    Attends Banker Meetings: More than 4 times per year    Marital Status: Living with partner  Intimate Partner Violence: Not At Risk (09/17/2023)   Humiliation, Afraid, Rape, and Kick questionnaire    Fear of Current or Ex-Partner: No    Emotionally Abused: No    Physically Abused: No    Sexually Abused: No    Family  History  Problem Relation Age of Onset   Diabetes Mother    Hypertension Mother    Epilepsy Mother    Anxiety disorder Mother    Depression Mother    Migraines Mother    Diabetes Maternal Grandmother    Stroke Maternal Grandmother    Hypertension Maternal Grandmother    Asthma Half-Brother    Asthma Half-Sister    Breast cancer Neg Hx     The following portions of the patient's history were reviewed and updated as appropriate: allergies, current medications, past OB history, past medical history, past surgical history, past family history, past social history, and problem list.  Constitutional: Denied constitutional symptoms, night sweats, recent illness, , fever, insomnia and weight loss. Yes, fatigue  Eyes: Denied eye symptoms, eye pain, photophobia, vision change and visual disturbance.  Ears/Nose/Throat/Neck: Denied ear, nose, throat or neck symptoms, hearing loss, nasal discharge, sinus congestion and sore throat.  Cardiovascular: Denied cardiovascular symptoms, arrhythmia, chest pain/pressure, edema, exercise intolerance, orthopnea and palpitations.  Respiratory: Denied pulmonary symptoms, asthma, pleuritic pain, productive sputum, cough, dyspnea and wheezing.  Gastrointestinal: Denied gastro-esophageal reflux, melena, Yes nausea and vomiting.  Genitourinary: Denied genitourinary symptoms including symptomatic vaginal discharge, pelvic relaxation issues, and urinary complaints.  Musculoskeletal: Denied musculoskeletal symptoms, stiffness, swelling, muscle weakness and myalgia.  Dermatologic: Denied dermatology symptoms, rash and scar.  Neurologic: Denied neurology symptoms, dizziness, headache, neck pain and syncope.  Psychiatric: Denied psychiatric symptoms, anxiety and depression.  Endocrine: Denied endocrine symptoms including hot flashes and night sweats.     OBJECTIVE: Initial Physical Exam (New OB)  Physical Exam Constitutional:      Appearance: Normal appearance.   Cardiovascular:     Rate and Rhythm: Normal rate and regular rhythm.     Pulses: Normal pulses.     Heart sounds: Normal heart sounds.  Pulmonary:     Effort: Pulmonary effort is normal.     Breath sounds: Normal breath sounds.  Chest:     Comments: Breasts: no redness or masses, nipples intact bilaterally  Abdominal:     Tenderness: There is no abdominal tenderness.     Comments: Fetal heart  tones 155   Genitourinary:    General: Normal vulva.     Vagina: No vaginal discharge.     Comments: SSE: cervix pink, no lesions, no bleeding  Musculoskeletal:        General: Normal range of motion.     Cervical back: Normal range of motion and neck supple.  Skin:    General: Skin is warm.  Neurological:     General: No focal deficit present.     Mental Status: She is alert.  Psychiatric:        Mood and Affect: Mood normal.        Thought Content: Thought content normal.        ASSESSMENT: Normal pregnancy   PLAN: Routine prenatal care. We discussed an overview of prenatal care and when to call. Reviewed diet, exercise, and weight gain recommendations in pregnancy. Discussed benefits of breastfeeding and lactation resources at [facility]. I reviewed labs and answered all questions.  1. Encounter for supervision of normal first pregnancy in first trimester (Primary) - NOB Panel - Culture, OB Urine - Hgb Fractionation Cascade - Toxoplasma antibodies- IgG and  IgM - Cervicovaginal ancillary only - Hemoglobin A1c - Cytology - PAP - MaterniT 21 plus Core, Blood - US  OB Comp + 14 Wk; Future  2. [redacted] weeks gestation of pregnancy - NOB Panel - Culture, OB Urine - Hgb Fractionation Cascade - Toxoplasma antibodies- IgG and  IgM - Cervicovaginal ancillary only - Hemoglobin A1c - Cytology - PAP - MaterniT 21 plus Core, Blood - US  OB Comp + 14 Wk; Future  3. Encounter for screening for cervical cancer - Cytology - PAP  4. Screening examination for venereal disease - NOB Panel -  Cytology - PAP  5. Genetic screening - MaterniT 21 plus Core, Blood  6. Nausea/vomiting in pregnancy - ondansetron  (ZOFRAN -ODT) 4 MG disintegrating tablet; Take 1 tablet (4 mg total) by mouth every 6 (six) hours as needed for nausea.  Dispense: 120 tablet; Refill: 3  7. Antenatal screening encounter - US  OB Comp + 14 Wk; Future   Will restart baby ASA   Shakerra Red M Amado Andal, CNM

## 2023-10-14 LAB — CBC/D/PLT+RPR+RH+ABO+RUBIGG...
Antibody Screen: NEGATIVE
Basophils Absolute: 0.1 x10E3/uL (ref 0.0–0.2)
Basos: 1 %
EOS (ABSOLUTE): 0.1 x10E3/uL (ref 0.0–0.4)
Eos: 1 %
HCV Ab: NONREACTIVE
HIV Screen 4th Generation wRfx: NONREACTIVE
Hematocrit: 39.9 % (ref 34.0–46.6)
Hemoglobin: 13.5 g/dL (ref 11.1–15.9)
Hepatitis B Surface Ag: NEGATIVE
Immature Grans (Abs): 0 x10E3/uL (ref 0.0–0.1)
Immature Granulocytes: 0 %
Lymphocytes Absolute: 1.9 x10E3/uL (ref 0.7–3.1)
Lymphs: 25 %
MCH: 31.3 pg (ref 26.6–33.0)
MCHC: 33.8 g/dL (ref 31.5–35.7)
MCV: 92 fL (ref 79–97)
Monocytes Absolute: 0.5 x10E3/uL (ref 0.1–0.9)
Monocytes: 6 %
Neutrophils Absolute: 5.3 x10E3/uL (ref 1.4–7.0)
Neutrophils: 67 %
Platelets: 302 x10E3/uL (ref 150–450)
RBC: 4.32 x10E6/uL (ref 3.77–5.28)
RDW: 13.1 % (ref 11.7–15.4)
RPR Ser Ql: NONREACTIVE
Rh Factor: POSITIVE
Rubella Antibodies, IGG: <0.9 {index} — ABNORMAL LOW (ref >0.99)
Varicella zoster IgG: REACTIVE
WBC: 7.8 x10E3/uL (ref 3.4–10.8)

## 2023-10-14 LAB — TOXOPLASMA ANTIBODIES- IGG AND  IGM
Toxoplasma Antibody- IgM: <3 [AU]/ml (ref 0.0–7.9)
Toxoplasma IgG Ratio: 20.7 [IU]/mL — ABNORMAL HIGH (ref 0.0–7.1)

## 2023-10-14 LAB — CYTOLOGY - PAP
Chlamydia: NEGATIVE
Comment: NEGATIVE
Comment: NEGATIVE
Comment: NORMAL
Diagnosis: NEGATIVE
Neisseria Gonorrhea: NEGATIVE
Trichomonas: NEGATIVE

## 2023-10-14 LAB — HGB FRACTIONATION CASCADE
Hgb A2: 2.7 % (ref 1.8–3.2)
Hgb A: 97.3 % (ref 96.4–98.8)
Hgb F: 0 % (ref 0.0–2.0)
Hgb S: 0 %

## 2023-10-14 LAB — HEMOGLOBIN A1C
Est. average glucose Bld gHb Est-mCnc: 91 mg/dL
Hgb A1c MFr Bld: 4.8 % (ref 4.8–5.6)

## 2023-10-14 LAB — CULTURE, OB URINE

## 2023-10-14 LAB — URINE CULTURE, OB REFLEX

## 2023-10-14 LAB — HCV INTERPRETATION

## 2023-10-16 ENCOUNTER — Ambulatory Visit: Payer: Self-pay | Admitting: Licensed Practical Nurse

## 2023-10-16 LAB — MATERNIT 21 PLUS CORE, BLOOD
Fetal Fraction: 15
Result (T21): NEGATIVE
Trisomy 13 (Patau syndrome): NEGATIVE
Trisomy 18 (Edwards syndrome): NEGATIVE
Trisomy 21 (Down syndrome): NEGATIVE

## 2023-10-21 ENCOUNTER — Emergency Department
Admission: EM | Admit: 2023-10-21 | Discharge: 2023-10-21 | Disposition: A | Discharge status: Home / Self Care | Attending: Emergency Medicine | Admitting: Emergency Medicine

## 2023-10-21 ENCOUNTER — Emergency Department

## 2023-10-21 ENCOUNTER — Other Ambulatory Visit: Payer: Self-pay

## 2023-10-21 DIAGNOSIS — O219 Vomiting of pregnancy, unspecified: Secondary | ICD-10-CM | POA: Diagnosis present

## 2023-10-21 DIAGNOSIS — R051 Acute cough: Secondary | ICD-10-CM | POA: Insufficient documentation

## 2023-10-21 DIAGNOSIS — E860 Dehydration: Secondary | ICD-10-CM | POA: Insufficient documentation

## 2023-10-21 DIAGNOSIS — Z3A14 14 weeks gestation of pregnancy: Secondary | ICD-10-CM | POA: Diagnosis not present

## 2023-10-21 DIAGNOSIS — O99281 Endocrine, nutritional and metabolic diseases complicating pregnancy, first trimester: Secondary | ICD-10-CM | POA: Diagnosis not present

## 2023-10-21 DIAGNOSIS — R42 Dizziness and giddiness: Secondary | ICD-10-CM

## 2023-10-21 LAB — COMPREHENSIVE METABOLIC PANEL WITH GFR
ALT: 8 U/L (ref 0–44)
AST: 16 U/L (ref 15–41)
Albumin: 3.7 g/dL (ref 3.5–5.0)
Alkaline Phosphatase: 31 U/L — ABNORMAL LOW (ref 38–126)
Anion gap: 8 (ref 5–15)
BUN: <5 mg/dL — ABNORMAL LOW (ref 6–20)
CO2: 22 mmol/L (ref 22–32)
Calcium: 8.7 mg/dL — ABNORMAL LOW (ref 8.9–10.3)
Chloride: 103 mmol/L (ref 98–111)
Creatinine, Ser: 0.6 mg/dL (ref 0.44–1.00)
GFR, Estimated: >60 mL/min (ref >60)
Glucose, Bld: 80 mg/dL (ref 70–99)
Potassium: 3.4 mmol/L — ABNORMAL LOW (ref 3.5–5.1)
Sodium: 133 mmol/L — ABNORMAL LOW (ref 135–145)
Total Bilirubin: 2.2 mg/dL — ABNORMAL HIGH (ref 0.0–1.2)
Total Protein: 6.7 g/dL (ref 6.5–8.1)

## 2023-10-21 LAB — CBC WITH DIFFERENTIAL/PLATELET
Abs Immature Granulocytes: 0.02 K/uL (ref 0.00–0.07)
Basophils Absolute: 0.1 K/uL (ref 0.0–0.1)
Basophils Relative: 1 %
Eosinophils Absolute: 0.1 K/uL (ref 0.0–0.5)
Eosinophils Relative: 1 %
HCT: 35 % — ABNORMAL LOW (ref 36.0–46.0)
Hemoglobin: 12.4 g/dL (ref 12.0–15.0)
Immature Granulocytes: 0 %
Lymphocytes Relative: 24 %
Lymphs Abs: 1.7 K/uL (ref 0.7–4.0)
MCH: 31.2 pg (ref 26.0–34.0)
MCHC: 35.4 g/dL (ref 30.0–36.0)
MCV: 88.2 fL (ref 80.0–100.0)
Monocytes Absolute: 0.5 K/uL (ref 0.1–1.0)
Monocytes Relative: 7 %
Neutro Abs: 4.6 K/uL (ref 1.7–7.7)
Neutrophils Relative %: 67 %
Platelets: 265 K/uL (ref 150–400)
RBC: 3.97 MIL/uL (ref 3.87–5.11)
RDW: 12.7 % (ref 11.5–15.5)
WBC: 7 K/uL (ref 4.0–10.5)
nRBC: 0 % (ref 0.0–0.2)

## 2023-10-21 LAB — URINALYSIS, ROUTINE W REFLEX MICROSCOPIC
Bilirubin Urine: NEGATIVE
Glucose, UA: NEGATIVE mg/dL
Hgb urine dipstick: NEGATIVE
Ketones, ur: 5 mg/dL — AB
Leukocytes,Ua: NEGATIVE
Nitrite: NEGATIVE
Protein, ur: NEGATIVE mg/dL
Specific Gravity, Urine: 1.008 (ref 1.005–1.030)
pH: 6 (ref 5.0–8.0)

## 2023-10-21 LAB — RESP PANEL BY RT-PCR (RSV, FLU A&B, COVID)  RVPGX2
Influenza A by PCR: NEGATIVE
Influenza B by PCR: NEGATIVE
Resp Syncytial Virus by PCR: NEGATIVE
SARS Coronavirus 2 by RT PCR: NEGATIVE

## 2023-10-21 LAB — LIPASE, BLOOD: Lipase: 26 U/L (ref 11–51)

## 2023-10-21 LAB — PREGNANCY, URINE: Preg Test, Ur: POSITIVE — AB

## 2023-10-21 MED ORDER — SODIUM CHLORIDE 0.9 % IV BOLUS
1000.0000 mL | Freq: Once | INTRAVENOUS | Status: AC
  Administered 2023-10-21: 1000 mL via INTRAVENOUS

## 2023-10-21 MED ORDER — METOCLOPRAMIDE HCL 5 MG/ML IJ SOLN
10.0000 mg | Freq: Once | INTRAMUSCULAR | Status: AC
  Administered 2023-10-21: 10 mg via INTRAVENOUS
  Filled 2023-10-21: qty 2

## 2023-10-21 NOTE — ED Notes (Signed)
 See triage note  Presents with cough for the past 2 days  Unsure fever  but is currently afebrile  States she is pregnant  Denies any vaginal bleed

## 2023-10-21 NOTE — ED Provider Notes (Signed)
 [facility] Provider Note    Event Date/Time   First MD Initiated Contact with Patient 10/21/23 1056     (approximate)   History   Cough   HPI  Kristen Grant is a 28 y.o. female who is currently [redacted] weeks pregnant presents emergency department with cough, congestion, questionable UTI.  Patient states painful urination.  Has vomiting a lot in the vomit has turned yellow.  States this has been ongoing since she got pregnant but she is afraid she is dehydrated.  Has some dizziness associated with this.  No cramping, vaginal bleeding at this time.  No concerns for miscarriage.  Just saw her OB last week.  Denies fever, chills.  States cough is productive but she does not know what color it is.      Physical Exam   Triage Vital Signs: ED Triage Vitals [10/21/23 1042]  Encounter Vitals Group     BP 111/76     Girls Systolic BP Percentile      Girls Diastolic BP Percentile      Boys Systolic BP Percentile      Boys Diastolic BP Percentile      Pulse Rate 82     Resp 16     Temp 98.1 F (36.7 C)     Temp Source Oral     SpO2 100 %     Weight 178 lb (80.7 kg)     Height 5' 9 (1.753 m)     Head Circumference      Peak Flow      Pain Score 0     Pain Loc      Pain Education      Exclude from Growth Chart     Most recent vital signs: Vitals:   10/21/23 1042  BP: 111/76  Pulse: 82  Resp: 16  Temp: 98.1 F (36.7 C)  SpO2: 100%     General: Awake, no distress.   CV:  Good peripheral perfusion. regular rate and  rhythm Resp:  Normal effort. Lungs CTA Abd:  No distention.  Nontender Other:     ED Results / Procedures / Treatments   Labs (all labs ordered are listed, but only abnormal results are displayed) Labs Reviewed  URINALYSIS, ROUTINE W REFLEX MICROSCOPIC - Abnormal; Notable for the following components:      Result Value   Color, Urine YELLOW (*)    APPearance HAZY (*)    Ketones, ur 5 (*)    All other components within  normal limits  COMPREHENSIVE METABOLIC PANEL WITH GFR - Abnormal; Notable for the following components:   Sodium 133 (*)    Potassium 3.4 (*)    BUN <5 (*)    Calcium  8.7 (*)    Alkaline Phosphatase 31 (*)    Total Bilirubin 2.2 (*)    All other components within normal limits  CBC WITH DIFFERENTIAL/PLATELET - Abnormal; Notable for the following components:   HCT 35.0 (*)    All other components within normal limits  PREGNANCY, URINE - Abnormal; Notable for the following components:   Preg Test, Ur POSITIVE (*)    All other components within normal limits  RESP PANEL BY RT-PCR (RSV, FLU A&B, COVID)  RVPGX2  LIPASE, BLOOD     EKG     RADIOLOGY     PROCEDURES:   Procedures  Critical Care:  no Chief Complaint  Patient presents with   Cough      MEDICATIONS ORDERED IN  ED: Medications  sodium chloride  0.9 % bolus 1,000 mL (0 mLs Intravenous Stopped 10/21/23 1412)  metoCLOPramide  (REGLAN ) injection 10 mg (10 mg Intravenous Given 10/21/23 1155)     IMPRESSION / MDM / ASSESSMENT AND PLAN / ED COURSE  I reviewed the triage vital signs and the nursing notes.                              Differential diagnosis includes, but is not limited to, dehydration, dizziness, COVID, influenza, RSV, UTI, pyelonephritis  Patient's presentation is most consistent with acute illness / injury with system symptoms.    Medications given: Normal saline 1 L IV, Reglan  10 mg IV  Labs ordered   Labs reassuring except for total bili is elevated at 2.2, ketones elevated at 5 send mild dehydration  Patient is feeling better after the fluids and Reglan .  Due to the elevated bilirubin and patient being pregnant we will do ultrasound right upper quadrant to ensure there is no gallbladder disease.  Ultrasound right upper quadrant independently reviewed interpreted by me as being negative for any acute abnormality  I did explain all of these findings to the patient.  Since she is feeling  better do not feel there is any need to do any further workup.  She is to follow-up with her GYN doctor.  Did instruct her to use Robitussin over-the-counter for her cough if needed.  Return emergency department if worsening.  She is in agreement with this treatment plan.  No need for admission due to patient appearing to be stable.  Discharged stable condition.   FINAL CLINICAL IMPRESSION(S) / ED DIAGNOSES   Final diagnoses:  Nausea and vomiting in pregnancy  Dizziness  Acute cough     Rx / DC Orders   ED Discharge Orders     None        Note:  This document was prepared using Dragon voice recognition software and may include unintentional dictation errors.    Gasper Devere ORN, PA-C 10/21/23 1533    Floy Roberts, MD 10/21/23 [phone]

## 2023-10-21 NOTE — ED Triage Notes (Signed)
 Pt c/o cough for the past few days that is not going away as well as being [redacted] weeks pregnant. Pt also feels she may have a UTI

## 2023-10-21 NOTE — ED Notes (Signed)
 Up to bathroom   States she feels a little better

## 2023-10-21 NOTE — Discharge Instructions (Signed)
 Take Robitussin for your cough Follow-up with your GYN doctor as needed Return if worsening

## 2023-10-27 ENCOUNTER — Other Ambulatory Visit

## 2023-11-05 ENCOUNTER — Encounter: Payer: Self-pay | Admitting: Physical Medicine & Rehabilitation

## 2023-11-05 ENCOUNTER — Encounter: Attending: Physical Medicine & Rehabilitation | Admitting: Physical Medicine & Rehabilitation

## 2023-11-05 VITALS — BP 113/74 | HR 90 | Ht 69.0 in | Wt 179.8 lb

## 2023-11-05 DIAGNOSIS — I69354 Hemiplegia and hemiparesis following cerebral infarction affecting left non-dominant side: Secondary | ICD-10-CM | POA: Diagnosis not present

## 2023-11-05 NOTE — Progress Notes (Signed)
 Subjective:    Patient ID: Kristen Grant, female    DOB: 1995-10-13, 28 y.o.   MRN: [id number]  HPI  CC:  Left lower extremity weakness as well as Right wrist   No falls or injuries ot knee or wrist     Hx R MCA infarct in 2022, patient completed both inpatient and outpatient rehabilitation at that time had very good outcome returning to work.  Despite that she did have some persistent left hand incoordination and mild left lower extremity weakness.  No longer working at [employer], she tried working at another job but this had longer hours.  She then became pregnant and now no longer works.  She does walk about 7000 steps a day but does no other exercise  The patient has had no falls or injuries.  Her left wrist feels okay her right wrist has dorsal pain that she notes mainly when she is driving.  She has no numbness or tingling in the fingers at night or while she is driving.  She has noted no swelling in the wrist joint.  No elbow pain. Left knee pain is relieved by an elastic knee sleeve.  She states that it is mainly in the front of the knee rather than behind the knee.  She has not noted swelling of her knee.  No numbness tingling shooting down the left lower extremity from her back.  Would like to resume PT  Pain Inventory Average Pain 6-10 Pain Right Now 1 My pain is intermittent, tingling, and aching  LOCATION OF PAIN  left knee, right wrist  BOWEL Number of stools per week: 1-2 Oral laxative use No    BLADDER Normal   Mobility walk without assistance ability to climb steps?  yes do you drive?  yes Do you have any goals in this area?  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  yes  Neuro/Psych bladder control problems weakness tremor tingling dizziness depression anxiety  Prior Studies Any changes since last visit?  yes Ultrasound for pregnancy   Physicians involved in your care Any changes since last  visit?  no   Family History  Problem Relation Age of Onset   Diabetes Mother    Hypertension Mother    Epilepsy Mother    Anxiety disorder Mother    Depression Mother    Migraines Mother    Diabetes Maternal Grandmother    Stroke Maternal Grandmother    Hypertension Maternal Grandmother    Asthma Half-Brother    Asthma Half-Sister    Breast cancer Neg Hx    Social History   Socioeconomic History   Marital status: Single    Spouse name: Armed forces operational officer   Number of children: 1   Years of education: Not on file   Highest education level: GED or equivalent  Occupational History   Not on file  Tobacco Use   Smoking status: Former    Types: E-cigarettes, Cigars    Start date: 2021   Smokeless tobacco: Never   Tobacco comments:    Per pt, Stopped smoking black and milds and vaping 2022  Vaping Use   Vaping status: Former   Quit date: 01/02/2022   Substances: Nicotine, THC   Devices: Patient tring to quit  Substance and Sexual Activity   Alcohol use: Not Currently    Alcohol/week: 1.0 standard drink of alcohol    Types: 1 Cans of beer per week  Comment: last use 02/2023 wine   Drug use: Yes    Types: Marijuana    Comment: trying to quit   Sexual activity: Yes    Partners: Male    Birth control/protection: None    Comment: depo stopped 2022  Other Topics Concern   Not on file  Social History Narrative   Not on file   Social Drivers of Health   Financial Resource Strain: Low Risk  (09/17/2023)   Overall Financial Resource Strain (CARDIA)    Difficulty of Paying Living Expenses: Not hard at all  Food Insecurity: No Food Insecurity (09/17/2023)   Hunger Vital Sign    Worried About Running Out of Food in the Last Year: Never true    Ran Out of Food in the Last Year: Never true  Transportation Needs: No Transportation Needs (09/17/2023)   PRAPARE - Administrator, Civil Service (Medical): No    Lack of Transportation (Non-Medical): No  Physical  Activity: Inactive (09/17/2023)   Exercise Vital Sign    Days of Exercise per Week: 0 days    Minutes of Exercise per Session: 0 min  Stress: No Stress Concern Present (09/17/2023)   [employer] of Occupational Health - Occupational Stress Questionnaire    Feeling of Stress: Only a little  Social Connections: Socially Integrated (03/22/2023)   Social Connection and Isolation Panel    Frequency of Communication with Friends and Family: More than three times a week    Frequency of Social Gatherings with Friends and Family: More than three times a week    Attends Religious Services: More than 4 times per year    Active Member of Clubs or Organizations: Yes    Attends Banker Meetings: More than 4 times per year    Marital Status: Living with partner   Past Surgical History:  Procedure Laterality Date   BUBBLE STUDY  10/17/2020   Procedure: BUBBLE STUDY;  Surgeon: Jeffrie Oneil BROCKS, MD;  Location: [facility];  Service: Cardiovascular;;   IR ANGIO INTRA EXTRACRAN SEL COM CAROTID INNOMINATE UNI L MOD SED  10/11/2020   IR ANGIO VERTEBRAL SEL VERTEBRAL UNI L MOD SED  10/11/2020   IR CT HEAD LTD  10/11/2020   IR CT HEAD LTD  10/12/2020   IR PERCUTANEOUS ART THROMBECTOMY/INFUSION INTRACRANIAL INC DIAG ANGIO  10/11/2020   IR PERCUTANEOUS ART THROMBECTOMY/INFUSION INTRACRANIAL INC DIAG ANGIO  10/12/2020   NO PAST SURGERIES     PATENT FORAMEN OVALE(PFO) CLOSURE N/A 12/10/2020   Procedure: PATENT FORAMEN OVALE (PFO) CLOSURE;  Surgeon: Ladona Heinz, MD;  [facility];  Service: Cardiovascular;  Laterality: N/A;   PATENT FORAMEN OVALE(PFO) CLOSURE N/A 06/16/2023   Procedure: PATENT FORAMEN OVALE(PFO) CLOSURE;  Surgeon: Ladona Heinz, MD;  [facility];  Service: Cardiovascular;  Laterality: N/A;   RADIOLOGY WITH ANESTHESIA N/A 10/12/2020   Procedure: RADIOLOGY WITH ANESTHESIA;  Surgeon: Radiologist, Medication, MD;  Location: [facility];  Service: Radiology;  Laterality: N/A;    RADIOLOGY WITH ANESTHESIA N/A 10/11/2020   Procedure: IR WITH ANESTHESIA;  Surgeon: Radiologist, Medication, MD;  Location: [facility];  Service: Radiology;  Laterality: N/A;   TEE WITHOUT CARDIOVERSION N/A 10/17/2020   Procedure: TRANSESOPHAGEAL ECHOCARDIOGRAM (TEE);  Surgeon: Jeffrie Oneil BROCKS, MD;  Location: [facility] ENDOSCOPY;  Service: Cardiovascular;  Laterality: N/A;   TRANSESOPHAGEAL ECHOCARDIOGRAM (CATH LAB) N/A 05/05/2023   Procedure: TRANSESOPHAGEAL ECHOCARDIOGRAM;  Surgeon: Mona Vinie BROCKS, MD;  [facility];  Service:  Cardiovascular;  Laterality: N/A;   Past Medical History:  Diagnosis Date   Anxiety    Asthma    no treatments required at this time   Chlamydia 2023   Depression    Gonorrhea 2024   Migraine headache    Nicotine vapor product user 10/06/2019   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   Stroke (HCC)    hx 2 ischemic strokesin September 2022--residual weakness to L face, arm and leg   BP 113/74   Pulse 90   Ht 5' 9 (1.753 m)   Wt 179 lb 12.8 oz (81.6 kg)   LMP  (LMP Unknown)   SpO2 100%   BMI 26.55 kg/m   Opioid Risk Score:   Fall Risk Score:  `1  Depression screen [facility] 2/9     11/05/2023    9:57 AM 03/17/2023   10:10 AM 09/09/2022   11:43 AM 09/09/2022   10:32 AM 05/26/2021    2:03 PM 04/21/2021    2:59 PM 04/18/2021    8:44 AM  Depression screen PHQ 2/9  Decreased Interest 1 3  2 1  1   Down, Depressed, Hopeless 1 2  2 1  1   PHQ - 2 Score 2 5  4 2  2   Altered sleeping  2 3  3     Tired, decreased energy  3 3  3     Change in appetite  1 2  3     Feeling bad or failure about yourself   0 1  1    Trouble concentrating  1 1  2     Moving slowly or fidgety/restless  0 2  2    Suicidal thoughts  0 1  1    PHQ-9 Score  12   17    Difficult doing work/chores  Somewhat difficult Extremely dIfficult  Somewhat difficult       Information is confidential and restricted. Go to Review Flowsheets to unlock data.    Review of Systems   Neurological:  Positive for dizziness and weakness.       Tingling  Psychiatric/Behavioral:         Depression, anxiety  All other systems reviewed and are negative.      Objective:   Physical Exam Vitals reviewed.  Constitutional:      Appearance: She is obese.  HENT:     Head: Normocephalic and atraumatic.  Eyes:     General: No visual field deficit.    Extraocular Movements: Extraocular movements intact.     Conjunctiva/sclera: Conjunctivae normal.     Pupils: Pupils are equal, round, and reactive to light.  Neurological:     Mental Status: She is alert and oriented to person, place, and time.     Cranial Nerves: No dysarthria.     Sensory: Sensation is intact.     Motor: Weakness and abnormal muscle tone present. No tremor.     Coordination: Coordination abnormal.  Psychiatric:        Mood and Affect: Mood normal.        Behavior: Behavior normal.   Motor strength is 4+ in the left deltoid, bicep, tricep, grip 4/5 in the left hip flexor, knee extensor, ankle dorsiflexor Ambulates without assistive device she has a stifflegged gait on the left side. Sensation is normal bilateral upper and lower limb Finger to thumb opposition is mildly reduced on the left side particularly to the 4th and 5th digits to the thumb. Tone is normal on the right  side On the left side there is hyperreflexia 3+ biceps triceps knee and ankle Speech without dysarthria or aphasia  Right wrist has no evidence of effusion there is no hypersensitivity to touch.  No pain with finger range of motion or elbow range of motion there is minimal pain with end range dorsiflexion of the wrist.  There is no limitation of wrist range of motion No tenderness over the extensor tendons of the wrist. Left knee has no evidence of effusion there is no erythema there is no hypersensitivity to touch There is no joint instability No tenderness along the patella there is no tenderness along the joint lines there is mild  tenderness over the hamstrings tendon     Assessment & Plan:  #1.  History of right MCA distribution infarct with residual mild left hemiparesis and motor control issues.  Overall she is doing quite well 2.  Left knee pain improved with wearing a sleeve.  I do think this is likely due to mild left lower extremity weakness as well as mild deconditioning since she has not been working anymore.  In addition she does have some motor control issues which may put some abnormal stresses or produce some abnormal motor firing patterns.  Have recommended physical therapy  3.  Right wrist pain no evidence of trauma it is mainly in the dorsal area.  She has a remote history of wrist fracture in 2008.  She could certainly be developing some posttraumatic arthritis at this point though does not look like she has any joint tenderness or inflammation.  Will send her to OT.  Have advised wearing a Velcro wrist splint when driving

## 2023-11-08 ENCOUNTER — Telehealth: Payer: Self-pay | Admitting: Licensed Clinical Social Worker

## 2023-11-08 ENCOUNTER — Encounter: Payer: Self-pay | Admitting: Certified Nurse Midwife

## 2023-11-08 DIAGNOSIS — O0990 Supervision of high risk pregnancy, unspecified, unspecified trimester: Secondary | ICD-10-CM | POA: Insufficient documentation

## 2023-11-08 NOTE — Therapy (Signed)
 OUTPATIENT OCCUPATIONAL THERAPY NEURO EVALUATION  Patient Name: Kristen Grant MRN: [id number] DOB:01/15/1996, 28 y.o., female Today's Date: 11/10/2023  PCP: none  REFERRING PROVIDER: [name], MD  END OF SESSION:  OT End of Session - 11/10/23 1538     Visit Number 1    Number of Visits 13    Date for Recertification  01/10/24    Authorization Type [employer] - awaiting auth    OT Start Time 1445    OT Stop Time 1533    OT Time Calculation (min) 48 min    Activity Tolerance Patient tolerated treatment well    Behavior During Therapy WFL for tasks assessed/performed          Past Medical History:  Diagnosis Date   Anxiety    Asthma    no treatments required at this time   Chlamydia 2023   Depression    Gonorrhea 2024   Migraine headache    Missed abortion with fetal demise before 20 completed weeks of gestation 04/12/2023   Nicotine vapor product user 10/06/2019   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   Stroke (HCC)    hx 2 ischemic strokesin September 2022--residual weakness to L face, arm and leg   Past Surgical History:  Procedure Laterality Date   BUBBLE STUDY  10/17/2020   Procedure: BUBBLE STUDY;  Surgeon: Jeffrie Oneil BROCKS, MD;  [facility]: [facility];  Service: Cardiovascular;;   IR ANGIO INTRA EXTRACRAN SEL COM CAROTID INNOMINATE UNI L MOD SED  10/11/2020   IR ANGIO VERTEBRAL SEL VERTEBRAL UNI L MOD SED  10/11/2020   IR CT HEAD LTD  10/11/2020   IR CT HEAD LTD  10/12/2020   IR PERCUTANEOUS ART THROMBECTOMY/INFUSION INTRACRANIAL INC DIAG ANGIO  10/11/2020   IR PERCUTANEOUS ART THROMBECTOMY/INFUSION INTRACRANIAL INC DIAG ANGIO  10/12/2020   NO PAST SURGERIES     PATENT FORAMEN OVALE(PFO) CLOSURE N/A 12/10/2020   Procedure: PATENT FORAMEN OVALE (PFO) CLOSURE;  Surgeon: Ladona Heinz, MD;  [facility];  Service: Cardiovascular;  Laterality: N/A;   PATENT FORAMEN OVALE(PFO) CLOSURE N/A 06/16/2023   Procedure: PATENT FORAMEN OVALE(PFO)  CLOSURE;  Surgeon: Ladona Heinz, MD;  [facility];  Service: Cardiovascular;  Laterality: N/A;   RADIOLOGY WITH ANESTHESIA N/A 10/12/2020   Procedure: RADIOLOGY WITH ANESTHESIA;  Surgeon: Radiologist, Medication, MD;  Location: [facility];  Service: Radiology;  Laterality: N/A;   RADIOLOGY WITH ANESTHESIA N/A 10/11/2020   Procedure: IR WITH ANESTHESIA;  Surgeon: Radiologist, Medication, MD;  Location: [facility];  Service: Radiology;  Laterality: N/A;   TEE WITHOUT CARDIOVERSION N/A 10/17/2020   Procedure: TRANSESOPHAGEAL ECHOCARDIOGRAM (TEE);  Surgeon: Jeffrie Oneil BROCKS, MD;  Location: [facility] ENDOSCOPY;  Service: Cardiovascular;  Laterality: N/A;   TRANSESOPHAGEAL ECHOCARDIOGRAM (CATH LAB) N/A 05/05/2023   Procedure: TRANSESOPHAGEAL ECHOCARDIOGRAM;  Surgeon: Mona Vinie BROCKS, MD;  [facility]: [facility];  Service: Cardiovascular;  Laterality: N/A;   Patient Active Problem List   Diagnosis Date Noted   Supervision of high risk pregnancy, antepartum 03/22/2023   Breast mass 09/09/2022   Depression/anxiety 04/22/2021   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   Cannabis use disorder, severe, dependence (HCC) daily 03/24/2021   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   History of stroke x2 10/2020 12/16/2020   Depression, major, single episode, moderate (HCC) 12/16/2020   PFO (patent foramen ovale) 12/09/2020   Anemia    Hemiparesis affecting left side as late effect of stroke (  HCC)    Leukocytosis    Dyslipidemia    Migraines  05/09/2019   LGSIL on Pap smear of cervix 08/18/2018   GAD (generalized anxiety disorder) 07/26/2017   ADD (attention deficit disorder) 07/07/2017   Maternal varicella, non-immune 08/01/2015    ONSET DATE: 11/05/2023  REFERRING DIAG: History of right MCA distribution infarct with residual mild left hemiparesis and motor control issues Note:  RIght wrist pain no trauma as well as vestibular eval  THERAPY DIAG:  Other lack  of coordination  Muscle weakness (generalized)  Pain in right wrist  Other disturbances of skin sensation  Rationale for Evaluation and Treatment: Rehabilitation  SUBJECTIVE:   SUBJECTIVE STATEMENT: I think I'm overusing my Rt hand due to Lt hand weakness from stroke, which is causing pain in my wrist. I notice the pain mostly when driving, trying to hold pot/pan, and holding/using my cell phone Pt accompanied by: self  PERTINENT HISTORY: per MD note on 11/05/23: History of right MCA distribution infarct (2022) with residual mild left hemiparesis and motor control issues. Right wrist pain no evidence of trauma it is mainly in the dorsal area. She has a remote history of wrist fracture in 2008. She could certainly be developing some posttraumatic arthritis at this point though does not look like she has any joint tenderness or inflammation. Will send her to OT. Have advised wearing a Velcro wrist splint when driving  PRECAUTIONS: Other: pregnant - caution w/ modality use  WEIGHT BEARING RESTRICTIONS: No  PAIN:  Are you having pain? Rt wrist 0-8/10, intermittent w/ certain tasks aggravating it.   FALLS: Has patient fallen in last 6 months? No  LIVING ENVIRONMENT: Lives with: lives with their family (mom, boyfriend, 67 y.o. son)  Lives in: 1 level home Has following equipment at home: shower chair but doesn't use  PLOF: Independent with basic ADLs and stopped working since pregnant   PATIENT GOALS: decrease Rt wrist pain, use Lt hand more, braid hair and tie shoes better  OBJECTIVE:  Note: Objective measures were completed at Evaluation unless otherwise noted.  HAND DOMINANCE: Right  ADLs:  Eating: assist w/ cutting food Grooming: assist to wash hair, mod I for brushing teeth and brushing hair but can't braid hair UB Dressing: mod I  LB Dressing: mod I - but usually gets help to tied shoes Toileting: independent Bathing: distant supervision Tub Shower transfers: distant  supervision because pt gets dizzy. Assist to get out of tub if pt bathes vs shower. Equipment: has shower chair but doesn't use b/c it takes up too much room  IADLs: Shopping: pt orders online and then picks up  Light housekeeping: pt does laundry (boyfriend and mom does)  Meal Prep: boyfriend does mostly, but pt helps w/ side dishes - assist for pots Community mobility: drives I'ly Medication management: I'ly Financial management: boyfriend does Handwriting: no changes  MOBILITY STATUS: Independent    FUNCTIONAL OUTCOME MEASURES: Upper Extremity Functional Scale (UEFS): 48.75% functioning (51.25% deficit)   UPPER EXTREMITY ROM:  BUE AROM WFL's but difficulty w/ coordination Lt hand   UPPER EXTREMITY MMT:     MMT Right eval Left eval  Shoulder flexion 5 3+  Shoulder abduction 5 4  Shoulder adduction    Shoulder extension    Shoulder internal rotation 5 4  Shoulder external rotation 5 4  Middle trapezius    Lower trapezius    Elbow flexion    Elbow extension    Wrist flexion    Wrist extension  Wrist ulnar deviation    Wrist radial deviation    Wrist pronation    Wrist supination    (Blank rows = not tested)  HAND FUNCTION: Grip strength: Right: 62.1 lbs; Left: 32.6 lbs  COORDINATION: 9 Hole Peg test: Right: 21.52 sec; Left: 37.83 sec Box and Blocks:  Left 35 blocks w/ fatigue  SENSATION: Light touch: WFL Hot/Cold: Impaired  and diminished  EDEMA: none   COGNITION: Overall cognitive status: Within functional limits for tasks assessed  VISION: Subjective report: no changes but does have dizziness Baseline vision: No visual deficits Visual history: none  VISION ASSESSMENT: Not tested   PERCEPTION: Not tested  PRAXIS: Not tested  OBSERVATIONS: new Rt wrist pain which pt reports is due to overuse from not using Lt hand as much, pain mostly with driving, trying to hold pots/pans, and trying to hold/use cellphone. Decreased coordination Lt hand  since stroke 2022 and some learned non use. Lt small finger abducts out and pt doesn't use as much however does demo in hand manipulation for checkers                                                                                                                             TREATMENT DATE: 11/10/23  Discussed O.T. findings from evaluation, pt goals and POC       PATIENT EDUCATION: Education details: SEE ABOVE Person educated: Patient Education method: Explanation Education comprehension: verbalized understanding  HOME EXERCISE PROGRAM: Not yet issued   GOALS: Goals reviewed with patient? Yes  SHORT TERM GOALS: Target date: 12/11/23  Independent with HEP for Lt hand coordination and grip strength Baseline: not yet addressed/issued Goal status: INITIAL  2.  Independent with isometric wrist strengthening HEP for Rt wrist  Baseline: not yet addressed/issued Goal status: INITIAL  3.  Pt to verbalize understanding with task modifications and potential A/E to decrease pain Rt wrist and increase ease/independence and safety (possible wrist brace prn, modifications to steering wheel, cell phone attachment, cutting food, holding pot/pan, alternative shoelace options prn) Baseline: not yet addressed/issued Goal status: INITIAL  4.  Pt to be independent with activities for Lt hand (m-CIMT activities) to promote Lt hand assist for tasks and to decrease learned non use  Baseline: not yet addressed/issued Goal status: INITIAL  5.  Pt to verbalize understanding with safety considerations d/t sensory loss Lt hand Baseline: not yet addressed/issued Goal status: INITIAL   LONG TERM GOALS: Target date: 01/10/24  Pt to report greater ease with tying shoes, holding cell phone, and using steering wheel w/ pain Rt wrist less than or equal to 4/10  Baseline: up to 8/10 Goal status: INITIAL  2.  Pt to simulate changing diaper in prep for new infant care at mod I level safely Baseline: not  yet addressed/issued Goal status: INITIAL  3.  Pt to braid hair w/ task modifications and A/E prn  Baseline: dependent Goal status: INITIAL  4.  Pt to improve  coordination Lt hand as evidenced by reducing speed on 9 hole peg test to 33 sec or less Baseline: 37 sec Goal status: INITIAL  5.  Pt to improve grip strength Lt hand to 38 lbs or greater to assist more in functional tasks Baseline: 32 lbs Goal status: INITIAL   ASSESSMENT:  CLINICAL IMPRESSION: Patient is a 28 y.o. female who was seen today for occupational therapy evaluation for Lt hemiparesis (from stroke in 2022) w/ some learned non use and new Rt wrist pain possibly from overuse and OA. Hx includes CVA. Pt is also currently pregnant. Patient currently presents below baseline level of functioning demonstrating functional deficits and impairments as noted below. Pt would benefit from skilled OT services in the outpatient setting to work on impairments as noted below to help pt incorporate Lt hand into more functional tasks and subsequently reduce Rt wrist pain .   PERFORMANCE DEFICITS: in functional skills including ADLs, IADLs, coordination, dexterity, sensation, strength, pain, Fine motor control, Gross motor control, mobility, balance, body mechanics, decreased knowledge of use of DME, and UE functional use.   IMPAIRMENTS: are limiting patient from ADLs, IADLs, leisure, and childcare needs.   CO-MORBIDITIES: may have co-morbidities  that affects occupational performance. Patient will benefit from skilled OT to address above impairments and improve overall function.  MODIFICATION OR ASSISTANCE TO COMPLETE EVALUATION: No modification of tasks or assist necessary to complete an evaluation.  OT OCCUPATIONAL PROFILE AND HISTORY: Detailed assessment: Review of records and additional review of physical, cognitive, psychosocial history related to current functional performance.  CLINICAL DECISION MAKING: Moderate - several  treatment options, min-mod task modification necessary  REHAB POTENTIAL: Good  EVALUATION COMPLEXITY: Moderate    PLAN:  OT FREQUENCY: 2x/week  OT DURATION: 6 weeks (plus eval)   PLANNED INTERVENTIONS: 97535 self care/ADL training, 02889 therapeutic exercise, 97530 therapeutic activity, 97112 neuromuscular re-education, 97140 manual therapy, 97018 paraffin, 02960 fluidotherapy, 97010 moist heat, 97010 cryotherapy, 97034 contrast bath, 97760 Orthotic Initial, 97763 Orthotic/Prosthetic subsequent, passive range of motion, patient/family education, and DME and/or AE instructions  RECOMMENDED OTHER SERVICES: none at this time  CONSULTED AND AGREED WITH PLAN OF CARE: Patient  PLAN FOR NEXT SESSION: progress towards STG's   For all possible CPT codes, reference the Planned Interventions line above.     Check all conditions that are expected to impact treatment: {Conditions expected to impact treatment:Neurological condition and/or seizures and Current pregnancy or recent postpartum   If treatment provided at initial evaluation, no treatment charged due to lack of authorization.        Kristen Grant, OT 11/10/2023, 3:39 PM

## 2023-11-08 NOTE — Progress Notes (Unsigned)
 Return Prenatal Note   Subjective   28 y.o. G3P1011 at [redacted]w[redacted]d presents for this follow-up prenatal visit.  Patient reports continued cough, congestion with mucous. Feels it is starting to improve but cough is keeping her up at night. Also with continued nausea/vomiting. Zofran  helps but is causing headaches. Reports increased depression/anxiety since beginning of pregnancy. She would like to restart the last medication she was on which helped the most with her mood. She had an appointment with Alan Hail, LCSW Thursday for counseling. Would like AFP today. Patient reports: Movement: Absent Contractions: Not present  Objective   Flow sheet Vitals: Pulse Rate: 68 BP: 108/63 Fetal Heart Rate (bpm): 145 Total weight gain: -8 lb 12.3 oz (-3.977 kg)  General Appearance  No acute distress, well appearing, and well nourished Pulmonary   Normal work of breathing Neurologic   Alert and oriented to person, place, and time Psychiatric   Mood and affect within normal limits    11/09/2023    8:50 AM 11/05/2023    9:57 AM 03/17/2023   10:10 AM 09/09/2022   11:43 AM 09/09/2022   10:32 AM  Depression screen PHQ 2/9  Decreased Interest 3 1 3  2   Down, Depressed, Hopeless 1 1 2  2   PHQ - 2 Score 4 2 5  4   Altered sleeping 3  2 3    Tired, decreased energy 3  3 3    Change in appetite 2  1 2    Feeling bad or failure about yourself  1  0 1   Trouble concentrating 3  1 1    Moving slowly or fidgety/restless 1  0 2   Suicidal thoughts 0  0 1   PHQ-9 Score 17  12    Difficult doing work/chores Extremely dIfficult  Somewhat difficult Extremely dIfficult       11/09/2023    8:50 AM 05/26/2021    2:03 PM 03/27/2021    2:10 PM 03/24/2021    5:18 PM  GAD 7 : Generalized Anxiety Score  Nervous, Anxious, on Edge 3 3 3    Control/stop worrying 3 3 3    Worry too much - different things 3 3 3    Trouble relaxing 3 3 3    Restless 0 2 3   Easily annoyed or irritable 3 3 3    Afraid - awful might happen 3  1 2    Total GAD 7 Score 18 18 20    Anxiety Difficulty Very difficult Somewhat difficult Very difficult      Information is confidential and restricted. Go to Review Flowsheets to unlock data.     Assessment/Plan   Plan  28 y.o. G3P1011 at [redacted]w[redacted]d presents for follow-up OB visit. Reviewed prenatal record including previous visit note.  Supervision of high risk pregnancy, antepartum Red flag symptoms reviewed. MFM referral placed.   Depression/anxiety Restart venlafaxine  as this helped previously and lexapro /buspar  were ineffective at managing her symptoms. Reviewed that untreated mood disorder in pregnancy has worse outcomes than risks of medication use. Follow up in 2 weeks.  History of stroke x2 10/2020 MFM referral placed for anticoagulation recommendations.  PFO (patent foramen ovale) Will defer to MFM re: fetal echo given PFO is a normal finding for fetal heart.      Orders Placed This Encounter  Procedures   US  MFM OB DETAIL +14 WK    Standing Status:   Future    Expected Date:   12/09/2023    Expiration Date:   11/07/2024    Reason  for Exam (SYMPTOM  OR DIAGNOSIS REQUIRED):   hx stroke x2 2022; patient with repaired PFO    Preferred Location[facility]   [facility]   AFP, Serum, Open Spina Bifida    Standing Status:   Future    Number of Occurrences:   1    Expected Date:   11/09/2023    Expiration Date:   11/07/2024    Is patient insulin dependent?:   No    Gestational Age (GA), weeks:   71    Date on which patient was at this GA:   11/08/2023    GA Calculation Method:   LMP    Number of fetuses:   1   AMB referral to maternal fetal medicine    Referral Priority:   Routine    Referral Type:   Consultation    Referral Reason:   Specialty Services Required    Number of Visits Requested:   1   Return in 4 weeks (on 12/07/2023) for ROB.   Future Appointments  Date Time Provider Department Center  11/10/2023  2:00 PM Willadean Perkins, [place]  [facility]  11/10/2023  2:45 PM Abelardo Burnard PARAS, OT [facility]  11/11/2023 10:30 AM Ellender Palma, LCSW [facility] None  11/26/2023  4:00 PM [facility] US  1 [facility] None  12/17/2023  9:30 AM Kirsteins, Prentice BRAVO, MD [facility]  02/14/2024  7:45 AM Whitfield Raisin, NP [facility] None    For next visit:  continue with routine prenatal care     Raisin LITTIE Cisco, CNM  10/07/259:01 AM

## 2023-11-08 NOTE — Telephone Encounter (Signed)
 Patient lvm 10/03 requesting an appointment. LCSW returned call today and scheduled appointment.

## 2023-11-09 ENCOUNTER — Encounter: Payer: Self-pay | Admitting: Certified Nurse Midwife

## 2023-11-09 ENCOUNTER — Ambulatory Visit (INDEPENDENT_AMBULATORY_CARE_PROVIDER_SITE_OTHER): Admitting: Certified Nurse Midwife

## 2023-11-09 VITALS — BP 108/63 | HR 68 | Wt 177.3 lb

## 2023-11-09 DIAGNOSIS — Q2112 Patent foramen ovale: Secondary | ICD-10-CM

## 2023-11-09 DIAGNOSIS — O0990 Supervision of high risk pregnancy, unspecified, unspecified trimester: Secondary | ICD-10-CM

## 2023-11-09 DIAGNOSIS — Z3A16 16 weeks gestation of pregnancy: Secondary | ICD-10-CM

## 2023-11-09 DIAGNOSIS — O219 Vomiting of pregnancy, unspecified: Secondary | ICD-10-CM | POA: Diagnosis not present

## 2023-11-09 DIAGNOSIS — Z8673 Personal history of transient ischemic attack (TIA), and cerebral infarction without residual deficits: Secondary | ICD-10-CM

## 2023-11-09 DIAGNOSIS — O99342 Other mental disorders complicating pregnancy, second trimester: Secondary | ICD-10-CM

## 2023-11-09 DIAGNOSIS — Z369 Encounter for antenatal screening, unspecified: Secondary | ICD-10-CM

## 2023-11-09 DIAGNOSIS — F32A Depression, unspecified: Secondary | ICD-10-CM

## 2023-11-09 MED ORDER — GUAIFENESIN ER 600 MG PO TB12: 600.0000 mg | ORAL_TABLET | Freq: Two times a day (BID) | ORAL | 0 refills | Status: AC

## 2023-11-09 MED ORDER — DOXYLAMINE-PYRIDOXINE 10-10 MG PO TBEC: DELAYED_RELEASE_TABLET | ORAL | 5 refills | Status: DC

## 2023-11-09 MED ORDER — VENLAFAXINE HCL ER 37.5 MG PO CP24: 37.5000 mg | ORAL_CAPSULE | Freq: Every day | ORAL | 1 refills | Status: DC

## 2023-11-09 NOTE — Assessment & Plan Note (Signed)
 Will defer to MFM re: fetal echo given PFO is a normal finding for fetal heart.

## 2023-11-09 NOTE — Patient Instructions (Addendum)
 Alpha-Fetoprotein Test: What to Know Why am I having this test? The alpha-fetoprotein (AFP) test is a blood test used to show if a pregnant person is at risk of carrying a baby with a congenital condition. A congenital condition is a problem that a baby is born with. The AFP test can be combined with other tests to look for these problems in the unborn baby: Genetic problems. Problems with the brain or spinal cord. Belly problems. This test is used to see if your baby is at risk for having a congenital condition. It doesn't show if your baby actually has the condition. More testing will be needed to know for sure. The AFP test may also be done for males or nonpregnant people to check for certain cancers. What is being tested? This test measures the amount of AFP in your blood. AFP is a protein that's normally found in your blood starting in the 10th week of pregnancy. The level of AFP is highest at 16-18 weeks of pregnancy. AFP testing can be done between 15 and 20 weeks of pregnancy, but 16 to 18 weeks of pregnancy is the best time to do it. Certain cancers can cause a high level of AFP in both males and females. What kind of sample is taken?  A blood sample is needed for this test. It's usually taken by putting a needle into a blood vessel. How are the results reported? Your results will be compared with normal results for this test. Each lab has its own range for what's normal. Most lab reports include the normal ranges for each test and a note telling you if yours is high or low. Normal results for the AFP test tend to be: Adult: Less than 40 ng/mL or less than 40 mcg/L (SI units). Child younger than 1 year: Less than 30 ng/mL. If you're pregnant, the values will change based on how far along you are. What do the results mean? If you're pregnant and your results are lower than expected, it can mean that your due date isn't right or that your baby may have trisomy 37, also known as Down  syndrome. If you're pregnant and your results are higher than expected, it may mean: You're pregnant with more than one baby. Your due date is not right. Your baby may have: A problem with the wall of the belly. A problem with the spinal cord or brain. Your baby isn't getting enough oxygen, or the heart rate is too fast or too slow. Your baby has died before being born. Results that are higher than expected in males or nonpregnant females may indicate: Cancer. Liver cell death. Talk with your health care provider about what your results mean. AFP testing is not used alone. Other testing will be needed. Questions to ask your health care provider Ask your provider, or the department that's doing the test: When will my results be ready? How will I get my results? What other tests do I need? What are my next steps? This information is not intended to replace advice given to you by your health care provider. Make sure you discuss any questions you have with your health care provider. Document Revised: 12/02/2022 Document Reviewed: 12/02/2022 Elsevier Patient Education  2025 [employer]. Second Trimester of Pregnancy  The second trimester of pregnancy is from week 14 through week 27. This is months 4 through 6 of pregnancy. During the second trimester: Morning sickness is less or has stopped. You may have more energy. You may  feel hungry more often. At this time, your unborn baby is growing very fast. At the end of the sixth month, the unborn baby may be up to 12 inches long and weigh about 1 pounds. You will likely start to feel the baby move between 16 and 20 weeks of pregnancy. Body changes during your second trimester Your body continues to change during this time. The changes usually go away after your baby is born. Physical changes You will gain more weight. Your belly will get bigger. You may begin to get stretch marks on your hips, belly, and breasts. Your breasts will keep  growing and may hurt. You may get dark spots or blotches on your face. A dark line from your belly button to the pubic area may appear. This line is called linea nigra. Your hair may grow faster and get thicker. Health changes You may have headaches. You may have heartburn. You may pee more often. You may have swollen, bulging veins (varicose veins). You may have trouble pooping (constipation), or swollen veins in the butt that can itch or get painful (hemorrhoids). You may have back pain. This is caused by: Weight gain. Pregnancy hormones that are relaxing the joints in your pelvis. Follow these instructions at home: Medicines Talk to your health care provider if you're taking medicines. Ask if the medicines are safe to take during pregnancy. Your provider may change the medicines that you take. Do not take any medicines unless told to by your provider. Take a prenatal vitamin that has at least 600 micrograms (mcg) of folic acid. Do not use herbal medicines, illegal drugs, or medicines that are not approved by your provider. Eating and drinking While you're pregnant your body needs extra food for your growing baby. Talk with your provider about what to eat while pregnant. Activity Most women are able to exercise during pregnancy. Exercises may need to change as your pregnancy goes on. Talk to your provider about your activities and exercise routines. Relieving pain and discomfort Wear a good, supportive bra if your breasts hurt. Rest with your legs raised if you have leg cramps or low back pain. Take warm sitz baths to soothe pain from hemorrhoids. Use hemorrhoid cream if your provider says it's okay. Do not douche. Do not use tampons or scented pads. Do not use hot tubs, steam rooms, or saunas. Safety Wear your seatbelt at all times when you're in a car. Talk to your provider if someone hits you, hurts you, or yells at you. Talk with your provider if you're feeling sad or have  thoughts of hurting yourself. Lifestyle Certain things can be harmful while you're pregnant. It's best to avoid the following: Do not drink alcohol,smoke, vape, or use products with nicotine or tobacco in them. If you need help quitting, talk with your provider. Avoid cat litter boxes and soil used by cats. These things carry germs that can cause harm to your pregnancy and your baby. General instructions Keep all follow-up visits. It helps you and your unborn baby stay as healthy as possible. Write down your questions. Take them to your prenatal visits. Your provider will: Talk with you about your overall health. Give you advice or refer you to specialists who can help with different needs, including: Prenatal education classes. Mental health and counseling. Foods and healthy eating. Ask for help if you need help with food. Where to find more information American Pregnancy Association: americanpregnancy.org [employer]: acog.org Office on [employer] Health: TravelLesson.ca  Contact a health care provider if: You have a headache that does not go away when you take medicine. You have any of these problems: You can't eat or drink. You throw up or feel like you may throw up. You have watery poop (diarrhea) for 2 days or more. You have pain when you pee or your pee smells bad. You have been sick for 2 days or more and are not getting better. Contact your provider right away if: You have any of these coming from your vagina: Abnormal discharge. Bad-smelling fluid. Bleeding. Your baby is moving less than usual. You have contractions, belly cramping, or have pain in your pelvis or lower back. You have symptoms of high blood pressure or preeclampsia. These include: A severe, throbbing headache that does not go away. Sudden or extreme swelling of your face, hands, legs, or feet. Vision problems: You see spots. You have blurry vision. Your eyes are  sensitive to light. If you can't reach the provider, go to an urgent care or emergency room. Get help right away if: You faint, become confused, or can't think clearly. You have chest pain or trouble breathing. You have any kind of injury, such as from a fall or a car crash. These symptoms may be an emergency. Call 911 right away. Do not wait to see if the symptoms will go away. Do not drive yourself to the hospital. This information is not intended to replace advice given to you by your health care provider. Make sure you discuss any questions you have with your health care provider. Document Revised: 10/22/2022 Document Reviewed: 05/22/2022 Elsevier Patient Education  2024 [employer] Problems to Watch for During Pregnancy During pregnancy, your body goes through many changes. Some changes may be uncomfortable. But most changes are not a serious problem. It's important to learn when certain signs and symptoms may be a problem. Talk with your health care provider about any medical conditions you have. Make sure you know the symptoms to watch for. Reporting problems early will prevent complications. Problems to watch for during pregnancy You're more likely to get an infection during pregnancy. Let your provider know if you have signs of infection, such as: A fever. A bad-smelling fluid from your vagina. Peeing too often, wanting to pee urgently, or pain when you pee. Also, let your provider know if: You're very tired, you feel dizzy, or you faint. You have watery poop (diarrhea) for 24 hours or longer. You throw up or feel like throwing up for 24 hours or longer. You have cramping in your belly or have pain in your hips or lower back. You have spotting, bleeding, or leaking of fluid from your vagina. You have pain, swelling, or redness in an arm or leg. You should also watch for signs of high blood pressure and preeclampsia. These signs can be very serious. They include: A headache  that doesn't go away when you take medicine. Sudden or very bad swelling of your face, hands, legs, or feet. Problems seeing, such as: You see spots. You have blurry vision. You may be sensitive to light. Why it's important to watch for these problems Watching and reporting problems to your provider can help prevent complications that may affect you and your baby. These include: Higher risk of giving birth early. Infection that may be passed on to your baby. Higher risk for stillbirth. Follow these instructions at home:  Take your medicines only as told. Keep all follow-up visits. Your provider needs to monitor your  health and your baby's health. Where to find more information To learn more, go to these websites: Centers for Disease Control and Prevention (CDC) at TonerPromos.no. Then: Click Health Topics A-Z. Type urgent maternal warning signs in the search box. [employer]necologists (ACOG): acog.org Contact a health care provider if: You have any problems while you're pregnant. You feel your baby moving less than usual. You have any of these things: You have strong emotions, such as sadness or anxiety, that affect your daily life. You do not feel safe in your home. You use tobacco, alcohol, or drugs, and you need help to stop. Get help right away if: You faint, have a seizure, or cannot think clearly. You have chest pain or difficulty breathing. You have any of the following symptoms and you were unable to reach your provider: You have symptoms of infection, including a fever, or have vaginal bleeding. You have symptoms of high blood pressure or preeclampsia. You have signs or symptoms of labor before 37 weeks of pregnancy. These include: Contractions that are 5 minutes or less apart, or that increase in frequency, intensity, or length. Sudden, sharp pain in the belly, or low back pain. Any amount of fluid that flows from your vagina without  stopping. These symptoms may be an emergency. Call 911 right away. Do not wait to see if the symptoms will go away. Do not drive yourself to the hospital. This information is not intended to replace advice given to you by your health care provider. Make sure you discuss any questions you have with your health care provider. Document Revised: 06/30/2022 Document Reviewed: 06/30/2022 Elsevier Patient Education  2024 [employer].

## 2023-11-09 NOTE — Assessment & Plan Note (Signed)
 MFM referral placed for anticoagulation recommendations.

## 2023-11-09 NOTE — Assessment & Plan Note (Signed)
 Red flag symptoms reviewed. MFM referral placed.

## 2023-11-09 NOTE — Assessment & Plan Note (Signed)
 Restart venlafaxine  as this helped previously and lexapro /buspar  were ineffective at managing her symptoms. Reviewed that untreated mood disorder in pregnancy has worse outcomes than risks of medication use. Follow up in 2 weeks.

## 2023-11-10 ENCOUNTER — Ambulatory Visit: Admitting: Occupational Therapy

## 2023-11-10 ENCOUNTER — Ambulatory Visit: Attending: Physical Medicine & Rehabilitation | Admitting: Physical Therapy

## 2023-11-10 ENCOUNTER — Encounter: Payer: Self-pay | Admitting: Physical Therapy

## 2023-11-10 ENCOUNTER — Other Ambulatory Visit: Payer: Self-pay

## 2023-11-10 VITALS — BP 108/57 | HR 66

## 2023-11-10 DIAGNOSIS — R278 Other lack of coordination: Secondary | ICD-10-CM

## 2023-11-10 DIAGNOSIS — I69354 Hemiplegia and hemiparesis following cerebral infarction affecting left non-dominant side: Secondary | ICD-10-CM | POA: Diagnosis present

## 2023-11-10 DIAGNOSIS — R208 Other disturbances of skin sensation: Secondary | ICD-10-CM

## 2023-11-10 DIAGNOSIS — M6281 Muscle weakness (generalized): Secondary | ICD-10-CM

## 2023-11-10 DIAGNOSIS — M25562 Pain in left knee: Secondary | ICD-10-CM | POA: Insufficient documentation

## 2023-11-10 DIAGNOSIS — M25531 Pain in right wrist: Secondary | ICD-10-CM

## 2023-11-10 DIAGNOSIS — R29898 Other symptoms and signs involving the musculoskeletal system: Secondary | ICD-10-CM | POA: Diagnosis present

## 2023-11-10 DIAGNOSIS — R2689 Other abnormalities of gait and mobility: Secondary | ICD-10-CM | POA: Insufficient documentation

## 2023-11-10 DIAGNOSIS — R41840 Attention and concentration deficit: Secondary | ICD-10-CM | POA: Diagnosis present

## 2023-11-10 NOTE — Therapy (Signed)
 OUTPATIENT PHYSICAL THERAPY NEURO EVALUATION   Patient Name: Kristen Grant MRN: [id number] DOB:1995-12-18, 28 y.o., female Today's Date: 11/10/2023   PCP: No PCP REFERRING PROVIDER: [name], MD  END OF SESSION:  PT End of Session - 11/10/23 1402     Visit Number 1    Number of Visits 9   8 plus Eval   Date for Recertification  12/17/23   For scheduling delays   Authorization Type [place] MEDICAID [facility]    Authorization - Number of Visits 27    PT Start Time 1400    PT Stop Time 1445    PT Time Calculation (min) 45 min    Equipment Utilized During Treatment --   pt had own soft knee brace L LE on   Activity Tolerance Patient tolerated treatment well    Behavior During Therapy WFL for tasks assessed/performed          Past Medical History:  Diagnosis Date   Anxiety    Asthma    no treatments required at this time   Chlamydia 2023   Depression    Gonorrhea 2024   Migraine headache    Missed abortion with fetal demise before 20 completed weeks of gestation 04/12/2023   Nicotine vapor product user 10/06/2019   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   Stroke (HCC)    hx 2 ischemic strokesin September 2022--residual weakness to L face, arm and leg   Past Surgical History:  Procedure Laterality Date   BUBBLE STUDY  10/17/2020   Procedure: BUBBLE STUDY;  Surgeon: Jeffrie Oneil BROCKS, MD;  Location: [facility] ENDOSCOPY;  Service: Cardiovascular;;   IR ANGIO INTRA EXTRACRAN SEL COM CAROTID INNOMINATE UNI L MOD SED  10/11/2020   IR ANGIO VERTEBRAL SEL VERTEBRAL UNI L MOD SED  10/11/2020   IR CT HEAD LTD  10/11/2020   IR CT HEAD LTD  10/12/2020   IR PERCUTANEOUS ART THROMBECTOMY/INFUSION INTRACRANIAL INC DIAG ANGIO  10/11/2020   IR PERCUTANEOUS ART THROMBECTOMY/INFUSION INTRACRANIAL INC DIAG ANGIO  10/12/2020   NO PAST SURGERIES     PATENT FORAMEN OVALE(PFO) CLOSURE N/A 12/10/2020   Procedure: PATENT FORAMEN OVALE (PFO) CLOSURE;  Surgeon: Ladona Heinz, MD;  Location: [facility]  INVASIVE CV LAB;  Service: Cardiovascular;  Laterality: N/A;   PATENT FORAMEN OVALE(PFO) CLOSURE N/A 06/16/2023   Procedure: PATENT FORAMEN OVALE(PFO) CLOSURE;  Surgeon: Ladona Heinz, MD;  Location: [facility] INVASIVE CV LAB;  Service: Cardiovascular;  Laterality: N/A;   RADIOLOGY WITH ANESTHESIA N/A 10/12/2020   Procedure: RADIOLOGY WITH ANESTHESIA;  Surgeon: Radiologist, Medication, MD;  Location: [facility] OR;  Service: Radiology;  Laterality: N/A;   RADIOLOGY WITH ANESTHESIA N/A 10/11/2020   Procedure: IR WITH ANESTHESIA;  Surgeon: Radiologist, Medication, MD;  Location: [facility] OR;  Service: Radiology;  Laterality: N/A;   TEE WITHOUT CARDIOVERSION N/A 10/17/2020   Procedure: TRANSESOPHAGEAL ECHOCARDIOGRAM (TEE);  Surgeon: Jeffrie Oneil BROCKS, MD;  Location: [facility] ENDOSCOPY;  Service: Cardiovascular;  Laterality: N/A;   TRANSESOPHAGEAL ECHOCARDIOGRAM (CATH LAB) N/A 05/05/2023   Procedure: TRANSESOPHAGEAL ECHOCARDIOGRAM;  Surgeon: Mona Vinie BROCKS, MD;  Location: [facility] INVASIVE CV LAB;  Service: Cardiovascular;  Laterality: N/A;   Patient Active Problem List   Diagnosis Date Noted   Supervision of high risk pregnancy, antepartum 03/22/2023   Breast mass 09/09/2022   Depression/anxiety 04/22/2021   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   Cannabis use disorder, severe, dependence (HCC) daily 03/24/2021   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   History of  stroke x2 10/2020 12/16/2020   Depression, major, single episode, moderate (HCC) 12/16/2020   PFO (patent foramen ovale) 12/09/2020   Anemia    Hemiparesis affecting left side as late effect of stroke (HCC)    Leukocytosis    Dyslipidemia    Migraines  05/09/2019   LGSIL on Pap smear of cervix 08/18/2018   GAD (generalized anxiety disorder) 07/26/2017   ADD (attention deficit disorder) 07/07/2017   Maternal varicella, non-immune 08/01/2015    ONSET DATE: Stroke Sept 2022; L knee pain started last May 2024  REFERRING DIAG:  P30.645 (ICD-10-CM) - Hemiparesis affecting left side as late effect of stroke (HCC)  THERAPY DIAG:  No diagnosis found.  Rationale for Evaluation and Treatment: Rehabilitation  SUBJECTIVE:                                                                                                                                                                                             SUBJECTIVE STATEMENT: Pt reports muscle in back of L calf was weak per MD (pt demonstrated L calf stretches the MD showed her) and feels like her L knee doesn't fully bend with stepping forward; feels like her L LE muscles are weak and L foot slaps when she gets fatigued.  Pt reports dizziness when rolling over in bed or getting OOB; during elevation changes when driving; and when looking down in shower (nervous to wash hair d/t this--boyfriend helps with washing hair).  L knee hurts so uses soft knee brace during day (aches depending on activities and typically more at night).  Has 56 y.o. son so stays pretty busy.  Boyfriend works a lot but will typically wait for him to be around for activities she needs assist with. Pt accompanied by: self  PERTINENT HISTORY:  Per [name], MD note 11/05/23: Hx R MCA infarct in 2022, patient completed both inpatient and outpatient rehabilitation at that time had very good outcome returning to work.  Despite that she did have some persistent left hand incoordination and mild left lower extremity weakness.   No longer working at [employer], she tried working at another job but this had longer hours.  She then became pregnant and now no longer works.  She does walk about 7000 steps a day but does no other exercise   The patient has had no falls or injuries.  Her left wrist feels okay her right wrist has dorsal pain that she notes mainly when she is driving.  She has no numbness or tingling in the fingers at night or while she is driving.  She has noted no swelling in  the wrist  joint.  No elbow pain. Left knee pain is relieved by an elastic knee sleeve.  She states that it is mainly in the front of the knee rather than behind the knee.  She has not noted swelling of her knee.  No numbness tingling shooting down the left lower extremity from her back.   Would like to resume PT  PAIN:  Are you having pain? Yes: NPRS scale: 0/10 at rest; 3/10 with activity Pain location: L knee Pain description: fatigue Aggravating factors: working/activity Relieving factors: elevation  PRECAUTIONS: Other: Pregnancy  RED FLAGS: None   WEIGHT BEARING RESTRICTIONS: No  FALLS: Has patient fallen in last 6 months? No  LIVING ENVIRONMENT: Lives with: lives with their family (pt's mother), lives with their partner, and lives with their son Lives in: House/apartment Stairs: Yes: External: 3 steps; none Has following equipment at home: shower chair  PLOF: Independent and Needs assistance with ADLs; boyfriend helps tie shoes or assists with fine motor activities with L hand; (+) driving; currently not working  PATIENT GOALS: To not have to use L knee brace; improve L knee pain.  Improve balance (in/out of shower) and L LE strength.  OBJECTIVE:  Note: Objective measures were completed at Evaluation unless otherwise noted.  DIAGNOSTIC FINDINGS:  CT Head 08/29/23: 1. No evidence of acute intracranial abnormality. 2. Chronic right MCA infarct. 3. Right sphenoid sinus fluid, correlate for acute sinusitis.  COGNITION: Overall cognitive status: Within functional limits for tasks assessed   SENSATION: Light touch: WFL B LE's  COORDINATION: L LE fatigues with decreased quality with rapid alternating toe taps  EDEMA:  WNL B LE's  MUSCLE TONE: Catch noted with quick stretch to L ankle into DF (normal R)  POSTURE: No Significant postural limitations  LOWER EXTREMITY ROM:     Active  Right Eval Left Eval  Hip flexion WNL WNL  Hip extension    Hip abduction    Hip  adduction    Hip internal rotation    Hip external rotation    Knee flexion WNL WNL  Knee extension WNL WNL  Ankle dorsiflexion WNL WNL  Ankle plantarflexion WNL WNL  Ankle inversion    Ankle eversion     (Blank rows = not tested)  LOWER EXTREMITY MMT:    MMT Right Eval Left Eval  Hip flexion 4+/5 4+/5  Hip extension    Hip abduction    Hip adduction    Hip internal rotation    Hip external rotation    Knee flexion 4+/5 4+/5  Knee extension 4+/5 4+/5  Ankle dorsiflexion 4+/5 4+/5  Ankle plantarflexion    Ankle inversion    Ankle eversion    (Blank rows = not tested)  BED MOBILITY:  Reports being independent but sometimes takes extra time if gets dizzy (dizziness goes away pretty quick)  TRANSFERS: Sit to stand: Complete Independence  Assistive device utilized: None     Stand to sit: Complete Independence  Assistive device utilized: None      STAIRS: Not tested GAIT: Findings: Gait Characteristics: step through pattern and decreased ankle dorsiflexion- Left, Distance walked: clinic distances, Assistive device utilized:None, Level of assistance: Complete Independence, and Comments: pt reports L LE feeling weak during ambulation  FUNCTIONAL TESTS:  5 times sit to stand: 19.37 seconds; no UE support  PATIENT SURVEYS:  LEFS: TBA    Vitals:  Beginning of session: Vitals:   11/10/23 1405  BP: (!) 108/57  Pulse: 66   End  of session (1445) BP 111/61 (MAP 73) with HR 66 bpm                                                                                                                              TREATMENT DATE: Deferred    PATIENT EDUCATION: Education details: Evaluation results; POC; Encouraged obtaining PCP (pt reports currently receiving needed care with other medical professionals); Plan to communicate with OBGYN for any precautions/restrictions d/t high risk pregnancy. Person educated: Patient Education method: Explanation Education comprehension:  verbalized understanding  HOME EXERCISE PROGRAM: TBA  GOALS: Goals reviewed with patient? Yes  SHORT TERM GOALS: Equals long term goals based on length of POC  LONG TERM GOALS: Target date: 12/10/23  Pt will be independent with final HEP in order to improve strength and balance in order to decrease fall risk and improve function at home for ADL's.  Baseline: To be issued (not issued d/t time) Goal status: INITIAL  2.  Pt will decrease 5 Time Sit to Stand by at least 3 seconds in order to demonstrate clinically significant improvement in LE strength.  Baseline: 19.37 seconds 11/10/23 Goal status: INITIAL  3.  Assess FGA and update FGA goal based on results. Baseline: TBA (not assessed d/t time). Goal status: INITIAL  4.  Pt will increase LEFS by at least 9 points in order to demonstrate significant improvement in lower extremity function.   Baseline: TBA (not assessed d/t time) Goal status: INITIAL   ASSESSMENT:  CLINICAL IMPRESSION: Patient is a 28 y.o. female with h/o stroke and current high risk pregnancy (antepartum [id number] at [redacted]w[redacted]d) who was seen today for physical therapy evaluation for L LE weakness s/p stroke (R MCA infarct in 2022) and L knee pain (since May 2024).  Patient presents with impairments with L knee pain and L LE weakness (L LE fatigues with activity per pt report and with rapid alternating toe taps in sitting during evaluation); difficulties with balance with higher level activities reported (ex: getting in/out of shower). These impairments are limiting patient from daily activities (pt very busy with 77 y.o. son).  Evaluation included the following assessment tools: 5 time sit to stand.  Pt scored 19.37 seconds on the 5 time sit to stand test indicating pt is at increased risk of falls.  Patient will benefit from skilled PT to address noted impairments, improve overall function, and progress towards long term goals.    OBJECTIVE IMPAIRMENTS: Abnormal gait,  decreased activity tolerance, decreased balance, decreased coordination, decreased endurance, decreased mobility, decreased strength, and pain.   ACTIVITY LIMITATIONS: carrying, lifting, bending, standing, squatting, stairs, locomotion level, and caring for others  PARTICIPATION LIMITATIONS: cleaning, shopping, community activity, occupation, yard work, and church  PERSONAL FACTORS: Past/current experiences, Time since onset of injury/illness/exacerbation, and 3+ comorbidities: R MCA stroke, migraine HA, L knee pain, R wrist pain are also affecting patient's functional outcome.   REHAB POTENTIAL: Good  CLINICAL DECISION MAKING: Evolving/moderate complexity  EVALUATION COMPLEXITY: Moderate  PLAN:  PT FREQUENCY: 2x/week  PT DURATION: 4 weeks  PLANNED INTERVENTIONS: [id number]- PT Re-evaluation, 97750- Physical Performance Testing, 97110-Therapeutic exercises, 97530- Therapeutic activity, [id number]- Neuromuscular re-education, 97535- Self Care, [id number]- Manual therapy, [id number]- Gait training, [phone]- Orthotic Initial, [phone]- Orthotic/Prosthetic subsequent, [phone]- Canalith repositioning, [phone]- Aquatic Therapy, Patient/Family education, Balance training, Stair training, Taping, Vestibular training, DME instructions, Cryotherapy, and Moist heat  PLAN FOR NEXT SESSION: Check if any precautions/restrictions for therapy by OBGYN d/t high risk pregnancy; LEFS survey; Assess FGA; L LE strengthening; pain control; balance   Damien Caulk, PT 11/10/2023, 4:18 PM

## 2023-11-11 ENCOUNTER — Ambulatory Visit: Payer: Self-pay | Admitting: Licensed Clinical Social Worker

## 2023-11-11 ENCOUNTER — Telehealth: Payer: Self-pay

## 2023-11-11 ENCOUNTER — Telehealth: Payer: Self-pay | Admitting: Physical Therapy

## 2023-11-11 DIAGNOSIS — F411 Generalized anxiety disorder: Secondary | ICD-10-CM | POA: Diagnosis not present

## 2023-11-11 LAB — AFP, SERUM, OPEN SPINA BIFIDA
AFP MoM: 0.8
AFP Value: 23.9 ng/mL
Gest. Age on Collection Date: 16.1 wk
Maternal Age At EDD: 28.8 a
OSBR Risk 1 IN: 10000
Test Results:: NEGATIVE
Weight: 177 [lb_av]

## 2023-11-11 NOTE — Telephone Encounter (Signed)
 Dear [name], CNM,  Kristen Grant was referred for physical therapy and I evaluated her 11/10/23.  Per chart, pt appears to be a high risk pregnancy.  Do you have any guidelines, precautions, or restrictions for therapy activity for this patient?  Thanks, Damien Caulk, PT

## 2023-11-11 NOTE — Telephone Encounter (Signed)
 Prior authorization submitted through CoverMyMeds.  Clinical questions answered and submitted.

## 2023-11-11 NOTE — Telephone Encounter (Signed)
 Prior authorization approved
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
## 2023-11-12 ENCOUNTER — Ambulatory Visit: Payer: Self-pay | Admitting: Certified Nurse Midwife

## 2023-11-16 ENCOUNTER — Ambulatory Visit: Admitting: Physical Therapy

## 2023-11-16 ENCOUNTER — Encounter: Payer: Self-pay | Admitting: Physical Therapy

## 2023-11-16 ENCOUNTER — Ambulatory Visit

## 2023-11-16 VITALS — BP 99/54 | HR 57

## 2023-11-16 DIAGNOSIS — M25562 Pain in left knee: Secondary | ICD-10-CM

## 2023-11-16 DIAGNOSIS — M6281 Muscle weakness (generalized): Secondary | ICD-10-CM

## 2023-11-16 DIAGNOSIS — R29898 Other symptoms and signs involving the musculoskeletal system: Secondary | ICD-10-CM

## 2023-11-16 DIAGNOSIS — R278 Other lack of coordination: Secondary | ICD-10-CM

## 2023-11-16 DIAGNOSIS — I69354 Hemiplegia and hemiparesis following cerebral infarction affecting left non-dominant side: Secondary | ICD-10-CM

## 2023-11-16 DIAGNOSIS — R2689 Other abnormalities of gait and mobility: Secondary | ICD-10-CM

## 2023-11-16 DIAGNOSIS — M25531 Pain in right wrist: Secondary | ICD-10-CM

## 2023-11-16 NOTE — Patient Instructions (Addendum)
  Coordination Activities  Perform the following activities for 10-15 minutes 1 times per day with left hand(s).  Rotate ball in fingertips (clockwise and counter-clockwise). Flip cards 1 at a time as fast as you can. Deal cards with your thumb (Hold deck in hand and push card off top with thumb). Pick up coins one at a time until you get 5-10 in your hand, then move coins from palm to fingertips to stack one at a time. Twirl pen between fingers. Screw together nuts and bolts, then unfasten.

## 2023-11-16 NOTE — Therapy (Signed)
 OUTPATIENT OCCUPATIONAL THERAPY NEURO TREATMENT  Patient Name: Kristen Grant MRN: [id number] DOB:05-16-1995, 28 y.o., female Today's Date: 11/16/2023  PCP: none  REFERRING PROVIDER: [name], MD  END OF SESSION:  OT End of Session - 11/16/23 1647     Visit Number 2    Date for Recertification  01/10/24    Authorization Type [employer] - awaiting auth    OT Start Time 1452    OT Stop Time 1530    OT Time Calculation (min) 38 min    Equipment Utilized During Treatment red theraputty    Activity Tolerance Patient tolerated treatment well;Patient limited by pain    Behavior During Therapy WFL for tasks assessed/performed           Past Medical History:  Diagnosis Date   Anxiety    Asthma    no treatments required at this time   Chlamydia 2023   Depression    Gonorrhea 2024   Migraine headache    Missed abortion with fetal demise before 20 completed weeks of gestation 04/12/2023   Nicotine vapor product user 10/06/2019   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   Stroke (HCC)    hx 2 ischemic strokesin September 2022--residual weakness to L face, arm and leg   Past Surgical History:  Procedure Laterality Date   BUBBLE STUDY  10/17/2020   Procedure: BUBBLE STUDY;  Surgeon: Jeffrie Oneil BROCKS, MD;  Location: [facility];  Service: Cardiovascular;;   IR ANGIO INTRA EXTRACRAN SEL COM CAROTID INNOMINATE UNI L MOD SED  10/11/2020   IR ANGIO VERTEBRAL SEL VERTEBRAL UNI L MOD SED  10/11/2020   IR CT HEAD LTD  10/11/2020   IR CT HEAD LTD  10/12/2020   IR PERCUTANEOUS ART THROMBECTOMY/INFUSION INTRACRANIAL INC DIAG ANGIO  10/11/2020   IR PERCUTANEOUS ART THROMBECTOMY/INFUSION INTRACRANIAL INC DIAG ANGIO  10/12/2020   NO PAST SURGERIES     PATENT FORAMEN OVALE(PFO) CLOSURE N/A 12/10/2020   Procedure: PATENT FORAMEN OVALE (PFO) CLOSURE;  Surgeon: Ladona Heinz, MD;  Location: [facility];  Service: Cardiovascular;  Laterality: N/A;   PATENT FORAMEN OVALE(PFO)  CLOSURE N/A 06/16/2023   Procedure: PATENT FORAMEN OVALE(PFO) CLOSURE;  Surgeon: Ladona Heinz, MD;  Location: [facility];  Service: Cardiovascular;  Laterality: N/A;   RADIOLOGY WITH ANESTHESIA N/A 10/12/2020   Procedure: RADIOLOGY WITH ANESTHESIA;  Surgeon: Radiologist, Medication, MD;  Location: [facility] OR;  Service: Radiology;  Laterality: N/A;   RADIOLOGY WITH ANESTHESIA N/A 10/11/2020   Procedure: IR WITH ANESTHESIA;  Surgeon: Radiologist, Medication, MD;  Location: [facility] OR;  Service: Radiology;  Laterality: N/A;   TEE WITHOUT CARDIOVERSION N/A 10/17/2020   Procedure: TRANSESOPHAGEAL ECHOCARDIOGRAM (TEE);  Surgeon: Jeffrie Oneil BROCKS, MD;  Location: [facility];  Service: Cardiovascular;  Laterality: N/A;   TRANSESOPHAGEAL ECHOCARDIOGRAM (CATH LAB) N/A 05/05/2023   Procedure: TRANSESOPHAGEAL ECHOCARDIOGRAM;  Surgeon: Mona Vinie BROCKS, MD;  Location: [facility];  Service: Cardiovascular;  Laterality: N/A;   Patient Active Problem List   Diagnosis Date Noted   Supervision of high risk pregnancy, antepartum 03/22/2023   Breast mass 09/09/2022   Depression/anxiety 04/22/2021   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   Cannabis use disorder, severe, dependence (HCC) daily 03/24/2021   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   History of stroke x2 10/2020 12/16/2020   Depression, major, single episode, moderate (HCC) 12/16/2020   PFO (patent foramen ovale) 12/09/2020   Anemia    Hemiparesis affecting left  side as late effect of stroke (HCC)    Leukocytosis    Dyslipidemia    Migraines  05/09/2019   LGSIL on Pap smear of cervix 08/18/2018   Generalized anxiety disorder 07/26/2017   ADD (attention deficit disorder) 07/07/2017   Maternal varicella, non-immune 08/01/2015    ONSET DATE: 11/05/2023  REFERRING DIAG: History of right MCA distribution infarct with residual mild left hemiparesis and motor control issues Note:  RIght wrist pain no trauma  as well as vestibular eval  THERAPY DIAG:  Other lack of coordination  Muscle weakness (generalized)  Pain in right wrist  Other symptoms and signs involving the musculoskeletal system  Rationale for Evaluation and Treatment: Rehabilitation  SUBJECTIVE:   SUBJECTIVE STATEMENT: Things are going okay, my R wrist only hurts when I'm moving it.  Pt accompanied by: self  PERTINENT HISTORY: per MD note on 11/05/23: History of right MCA distribution infarct (2022) with residual mild left hemiparesis and motor control issues. Right wrist pain no evidence of trauma it is mainly in the dorsal area. She has a remote history of wrist fracture in 2008. She could certainly be developing some posttraumatic arthritis at this point though does not look like she has any joint tenderness or inflammation. Will send her to OT. Have advised wearing a Velcro wrist splint when driving  PRECAUTIONS: Other: pregnant - caution w/ modality use  WEIGHT BEARING RESTRICTIONS: No  PAIN:  Are you having pain? Rt wrist 0/10, intermittent w/ certain tasks aggravating it.   FALLS: Has patient fallen in last 6 months? No  LIVING ENVIRONMENT: Lives with: lives with their family (mom, boyfriend, 91 y.o. son)  Lives in: 1 level home Has following equipment at home: shower chair but doesn't use  PLOF: Independent with basic ADLs and stopped working since pregnant   PATIENT GOALS: decrease Rt wrist pain, use Lt hand more, braid hair and tie shoes better  OBJECTIVE:  Note: Objective measures were completed at Evaluation unless otherwise noted.  HAND DOMINANCE: Right  ADLs:  Eating: assist w/ cutting food Grooming: assist to wash hair, mod I for brushing teeth and brushing hair but can't braid hair UB Dressing: mod I  LB Dressing: mod I - but usually gets help to tied shoes Toileting: independent Bathing: distant supervision Tub Shower transfers: distant supervision because pt gets dizzy. Assist to get out of  tub if pt bathes vs shower. Equipment: has shower chair but doesn't use b/c it takes up too much room  IADLs: Shopping: pt orders online and then picks up  Light housekeeping: pt does laundry (boyfriend and mom does)  Meal Prep: boyfriend does mostly, but pt helps w/ side dishes - assist for pots Community mobility: drives [employer] Medication management: [employer] Financial management: boyfriend does Handwriting: no changes  MOBILITY STATUS: Independent    FUNCTIONAL OUTCOME MEASURES: Upper Extremity Functional Scale (UEFS): 48.75% functioning (51.25% deficit)   UPPER EXTREMITY ROM:  BUE AROM WFL's but difficulty w/ coordination Lt hand   UPPER EXTREMITY MMT:     MMT Right eval Left eval  Shoulder flexion 5 3+  Shoulder abduction 5 4  Shoulder adduction    Shoulder extension    Shoulder internal rotation 5 4  Shoulder external rotation 5 4  Middle trapezius    Lower trapezius    Elbow flexion    Elbow extension    Wrist flexion    Wrist extension    Wrist ulnar deviation    Wrist radial deviation  Wrist pronation    Wrist supination    (Blank rows = not tested)  HAND FUNCTION: Grip strength: Right: 62.1 lbs; Left: 32.6 lbs  COORDINATION: 9 Hole Peg test: Right: 21.52 sec; Left: 37.83 sec Box and Blocks:  Left 35 blocks w/ fatigue  SENSATION: Light touch: WFL Hot/Cold: Impaired  and diminished  EDEMA: none   COGNITION: Overall cognitive status: Within functional limits for tasks assessed  VISION: Subjective report: no changes but does have dizziness Baseline vision: No visual deficits Visual history: none  VISION ASSESSMENT: Not tested   PERCEPTION: Not tested  PRAXIS: Not tested  OBSERVATIONS: new Rt wrist pain which pt reports is due to overuse from not using Lt hand as much, pain mostly with driving, trying to hold pots/pans, and trying to hold/use cellphone. Decreased coordination Lt hand since stroke 2022 and some learned non use. Lt small  finger abducts out and pt doesn't use as much however does demo in hand manipulation for checkers                                                                                                                             TREATMENT DATE: 11/16/23   Educated pt in [place] coordination activities to complete to improve coordination and dexterity in L hand for ADL/IADL carryover. Also educated pt in R wrist isometrics. Handouts provided for both and also provided visual demo and verbal instruction for increased carryover. Pt demonstrated good understanding. Pt instructed to not overdo exercises and to space out exercises and coordination activities as needed. Pt verbalized understanding. Pt educated in proper use of heat for reducing pain in R wrist and precautions to take to ensure safety and skin integrity.     PATIENT EDUCATION: Education details: SEE ABOVE Person educated: Patient Education method: Explanation Education comprehension: verbalized understanding  HOME EXERCISE PROGRAM: 11/16/23: Coordination and Wrist isometrics (ACCESS CODE [id number])   GOALS: Goals reviewed with patient? Yes  SHORT TERM GOALS: Target date: 12/11/23  Independent with HEP for Lt hand coordination and grip strength Baseline: not yet addressed/issued Goal status: IN PROGRESS  2.  Independent with isometric wrist strengthening HEP for Rt wrist  Baseline: not yet addressed/issued Goal status: IN PROGRESS  3.  Pt to verbalize understanding with task modifications and potential A/E to decrease pain Rt wrist and increase ease/independence and safety (possible wrist brace prn, modifications to steering wheel, cell phone attachment, cutting food, holding pot/pan, alternative shoelace options prn) Baseline: not yet addressed/issued Goal status: INITIAL  4.  Pt to be independent with activities for Lt hand (m-CIMT activities) to promote Lt hand assist for tasks and to decrease learned non use  Baseline: not yet  addressed/issued Goal status: INITIAL  5.  Pt to verbalize understanding with safety considerations d/t sensory loss Lt hand Baseline: not yet addressed/issued Goal status: INITIAL   LONG TERM GOALS: Target date: 01/10/24  Pt to report greater ease with tying shoes, holding cell phone, and  using steering wheel w/ pain Rt wrist less than or equal to 4/10  Baseline: up to 8/10 Goal status: INITIAL  2.  Pt to simulate changing diaper in prep for new infant care at mod I level safely Baseline: not yet addressed/issued Goal status: INITIAL  3.  Pt to braid hair w/ task modifications and A/E prn  Baseline: dependent Goal status: INITIAL  4.  Pt to improve coordination Lt hand as evidenced by reducing speed on 9 hole peg test to 33 sec or less Baseline: 37 sec Goal status: INITIAL  5.  Pt to improve grip strength Lt hand to 38 lbs or greater to assist more in functional tasks Baseline: 32 lbs Goal status: INITIAL   ASSESSMENT:  CLINICAL IMPRESSION: Patient is a 28 y.o. female who was seen today for occupational therapy evaluation for Lt hemiparesis (from stroke in 2022) w/ some learned non use and new Rt wrist pain possibly from overuse and OA. Hx includes CVA. Pt is also currently pregnant. Patient currently presents below baseline level of functioning demonstrating functional deficits and impairments as noted below. Pt participated well with FM coordination and R wrist isometrics, demonstrates good understanding of ceasing should any pain occur.   PERFORMANCE DEFICITS: in functional skills including ADLs, IADLs, coordination, dexterity, sensation, strength, pain, Fine motor control, Gross motor control, mobility, balance, body mechanics, decreased knowledge of use of DME, and UE functional use.   IMPAIRMENTS: are limiting patient from ADLs, IADLs, leisure, and childcare needs.   CO-MORBIDITIES: may have co-morbidities  that affects occupational performance. Patient will benefit  from skilled OT to address above impairments and improve overall function.  MODIFICATION OR ASSISTANCE TO COMPLETE EVALUATION: No modification of tasks or assist necessary to complete an evaluation.  OT OCCUPATIONAL PROFILE AND HISTORY: Detailed assessment: Review of records and additional review of physical, cognitive, psychosocial history related to current functional performance.  CLINICAL DECISION MAKING: Moderate - several treatment options, min-mod task modification necessary  REHAB POTENTIAL: Good  EVALUATION COMPLEXITY: Moderate    PLAN:  OT FREQUENCY: 2x/week  OT DURATION: 6 weeks (plus eval)   PLANNED INTERVENTIONS: 97535 self care/ADL training, 02889 therapeutic exercise, 97530 therapeutic activity, 97112 neuromuscular re-education, 97140 manual therapy, 97018 paraffin, 02960 fluidotherapy, 97010 moist heat, 97010 cryotherapy, 97034 contrast bath, 97760 Orthotic Initial, 97763 Orthotic/Prosthetic subsequent, passive range of motion, patient/family education, and DME and/or AE instructions  RECOMMENDED OTHER SERVICES: none at this time  CONSULTED AND AGREED WITH PLAN OF CARE: Patient  PLAN FOR NEXT SESSION: Continue wrist strengthening RUE, coordination tasks for LUE and BUE tasks      For all possible CPT codes, reference the Planned Interventions line above.     Check all conditions that are expected to impact treatment: {Conditions expected to impact treatment:Neurological condition and/or seizures and Current pregnancy or recent postpartum   If treatment provided at initial evaluation, no treatment charged due to lack of authorization.        Rocky Dutch, OT 11/16/2023, 4:48 PM

## 2023-11-16 NOTE — Therapy (Signed)
 OUTPATIENT PHYSICAL THERAPY NEURO TREATMENT   Patient Name: Kristen Grant MRN: [id number] DOB:[birth year], 28 y.o., female Today's Date: 11/16/2023   PCP: No PCP REFERRING PROVIDER: [name], MD  END OF SESSION:  PT End of Session - 11/16/23 1535     Visit Number 2    Number of Visits 9   8 plus Eval   Date for Recertification  12/17/23   For scheduling delays   Authorization Type [place] MEDICAID [facility]    PT Start Time 1534    PT Stop Time 1620    PT Time Calculation (min) 46 min    Equipment Utilized During Treatment Gait belt   pt had own soft knee brace L LE on; gait belt positioned under armpits   Activity Tolerance Patient tolerated treatment well    Behavior During Therapy WFL for tasks assessed/performed          Past Medical History:  Diagnosis Date   Anxiety    Asthma    no treatments required at this time   Chlamydia 2023   Depression    Gonorrhea 2024   Migraine headache    Missed abortion with fetal demise before 20 completed weeks of gestation 04/12/2023   Nicotine vapor product user 10/06/2019   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   Stroke (HCC)    hx 2 ischemic strokesin September 2022--residual weakness to L face, arm and leg   Past Surgical History:  Procedure Laterality Date   BUBBLE STUDY  10/17/2020   Procedure: BUBBLE STUDY;  Surgeon: Jeffrie Oneil BROCKS, MD;  Location: [facility] ENDOSCOPY;  Service: Cardiovascular;;   IR ANGIO INTRA EXTRACRAN SEL COM CAROTID INNOMINATE UNI L MOD SED  10/11/2020   IR ANGIO VERTEBRAL SEL VERTEBRAL UNI L MOD SED  10/11/2020   IR CT HEAD LTD  10/11/2020   IR CT HEAD LTD  10/12/2020   IR PERCUTANEOUS ART THROMBECTOMY/INFUSION INTRACRANIAL INC DIAG ANGIO  10/11/2020   IR PERCUTANEOUS ART THROMBECTOMY/INFUSION INTRACRANIAL INC DIAG ANGIO  10/12/2020   NO PAST SURGERIES     PATENT FORAMEN OVALE(PFO) CLOSURE N/A 12/10/2020   Procedure: PATENT FORAMEN OVALE (PFO) CLOSURE;  Surgeon: Ladona Heinz, MD;  Location: [facility]  INVASIVE CV LAB;  Service: Cardiovascular;  Laterality: N/A;   PATENT FORAMEN OVALE(PFO) CLOSURE N/A 06/16/2023   Procedure: PATENT FORAMEN OVALE(PFO) CLOSURE;  Surgeon: Ladona Heinz, MD;  Location: [facility] INVASIVE CV [facility];  Service: Cardiovascular;  Laterality: N/A;   RADIOLOGY WITH ANESTHESIA N/A 10/12/2020   Procedure: RADIOLOGY WITH ANESTHESIA;  Surgeon: Radiologist, Medication, MD;  Location: [facility] OR;  Service: Radiology;  Laterality: N/A;   RADIOLOGY WITH ANESTHESIA N/A 10/11/2020   Procedure: IR WITH ANESTHESIA;  Surgeon: Radiologist, Medication, MD;  Location: [facility] OR;  Service: Radiology;  Laterality: N/A;   TEE WITHOUT CARDIOVERSION N/A 10/17/2020   Procedure: TRANSESOPHAGEAL ECHOCARDIOGRAM (TEE);  Surgeon: Jeffrie Oneil BROCKS, MD;  Location: [facility] ENDOSCOPY;  Service: Cardiovascular;  Laterality: N/A;   TRANSESOPHAGEAL ECHOCARDIOGRAM (CATH LAB) N/A 05/05/2023   Procedure: TRANSESOPHAGEAL ECHOCARDIOGRAM;  Surgeon: Mona Vinie BROCKS, MD;  Location: [facility] INVASIVE CV LAB;  Service: Cardiovascular;  Laterality: N/A;   Patient Active Problem List   Diagnosis Date Noted   Supervision of high risk pregnancy, antepartum 03/22/2023   Breast mass 09/09/2022   Depression/anxiety 04/22/2021   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   Cannabis use disorder, severe, dependence (HCC) daily 03/24/2021   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   History of stroke x2 10/2020  12/16/2020   Depression, major, single episode, moderate (HCC) 12/16/2020   PFO (patent foramen ovale) 12/09/2020   Anemia    Hemiparesis affecting left side as late effect of stroke (HCC)    Leukocytosis    Dyslipidemia    Migraines  05/09/2019   LGSIL on Pap smear of cervix 08/18/2018   Generalized anxiety disorder 07/26/2017   ADD (attention deficit disorder) 07/07/2017   Maternal varicella, non-immune 08/01/2015    ONSET DATE: Stroke Sept 2022; L knee pain started last May 2024  REFERRING DIAG: P30.645  (ICD-10-CM) - Hemiparesis affecting left side as late effect of stroke (HCC)  THERAPY DIAG:  Muscle weakness (generalized)  Hemiparesis affecting left side as late effect of stroke (HCC)  Other abnormalities of gait and mobility  Left knee pain, unspecified chronicity  Rationale for Evaluation and Treatment: Rehabilitation  SUBJECTIVE:                                                                                                                                                                                             SUBJECTIVE STATEMENT:  Pt received after OT session.  Pt reports resting today at home prior to this; going to son's football game after therapy.  L UE feels tired from OT session.  Had some dizziness this morning but took vertigo medicine--no dizziness since. Pt accompanied by: self  PERTINENT HISTORY:  Per [name], MD note 11/05/23: Hx R MCA infarct in 2022, patient completed both inpatient and outpatient rehabilitation at that time had very good outcome returning to work.  Despite that she did have some persistent left hand incoordination and mild left lower extremity weakness.   No longer working at [employer], she tried working at another job but this had longer hours.  She then became pregnant and now no longer works.  She does walk about 7000 steps a day but does no other exercise   The patient has had no falls or injuries.  Her left wrist feels okay her right wrist has dorsal pain that she notes mainly when she is driving.  She has no numbness or tingling in the fingers at night or while she is driving.  She has noted no swelling in the wrist joint.  No elbow pain. Left knee pain is relieved by an elastic knee sleeve.  She states that it is mainly in the front of the knee rather than behind the knee.  She has not noted swelling of her knee.  No numbness tingling shooting down the left lower extremity from her back.  PMH includes asthma, migraine HA,  R MCA stroke (h/o  2 ischemic strokes in Sept 2022--residual weakness to L face, arm, and leg), PFO closure.  Not currently on anti-coagulation.  PAIN:  Are you having pain? Yes: NPRS scale: 0/10 at rest; 0/10 with activity Pain location: L knee Pain description: fatigue Aggravating factors: working/activity Relieving factors: elevation  PRECAUTIONS: Other: Pregnancy (No specific precautions due to pregnancy per CNM 11/11/23)  RED FLAGS: None   WEIGHT BEARING RESTRICTIONS: No  FALLS: Has patient fallen in last 6 months? No  LIVING ENVIRONMENT: Lives with: lives with their family (pt's mother), lives with their partner, and lives with their son (104 y.o.) Lives in: House/apartment Stairs: Yes: External: 3 steps; none Has following equipment at home: shower chair  PLOF: Independent and Needs assistance with ADLs; boyfriend helps tie shoes or assists with fine motor activities with L hand; (+) driving; currently not working  PATIENT GOALS: To not have to use L knee brace; improve L knee pain.  Improve balance (in/out of shower) and L LE strength.  OBJECTIVE:  Note: Objective measures were completed at Evaluation unless otherwise noted.  DIAGNOSTIC FINDINGS:  CT Head 08/29/23: 1. No evidence of acute intracranial abnormality. 2. Chronic right MCA infarct. 3. Right sphenoid sinus fluid, correlate for acute sinusitis.  COGNITION: Overall cognitive status: Within functional limits for tasks assessed   SENSATION: Light touch: WFL B LE's  COORDINATION: L LE fatigues with decreased quality with rapid alternating toe taps (in sitting)  EDEMA:  WNL B LE's  MUSCLE TONE: Catch noted with quick stretch to L ankle into DF (normal R)  POSTURE: No Significant postural limitations  LOWER EXTREMITY ROM:     Active  Right Eval Left Eval  Hip flexion WNL WNL  Hip extension    Hip abduction    Hip adduction    Hip internal rotation    Hip external rotation    Knee flexion WNL  WNL  Knee extension WNL WNL  Ankle dorsiflexion WNL WNL  Ankle plantarflexion WNL WNL  Ankle inversion    Ankle eversion     (Blank rows = not tested)  LOWER EXTREMITY MMT:    MMT Right Eval Left Eval  Hip flexion 4+/5 4+/5  Hip extension    Hip abduction    Hip adduction    Hip internal rotation    Hip external rotation    Knee flexion 4+/5 4+/5  Knee extension 4+/5 4+/5  Ankle dorsiflexion 4+/5 4+/5  Ankle plantarflexion    Ankle inversion    Ankle eversion    (Blank rows = not tested)  BED MOBILITY:  Reports being independent but sometimes takes extra time if gets dizzy (dizziness goes away pretty quick)  TRANSFERS: Sit to stand: Complete Independence  Assistive device utilized: None     Stand to sit: Complete Independence  Assistive device utilized: None      STAIRS: Not tested GAIT: Findings: Gait Characteristics: step through pattern and decreased ankle dorsiflexion- Left, Distance walked: clinic distances, Assistive device utilized:None, Level of assistance: Complete Independence, and Comments: pt reports L LE feeling weak during ambulation  FUNCTIONAL TESTS:  5 times sit to stand: 19.37 seconds; no UE support FGA: 21/30 (11/16/23)  FUNCTIONAL GAIT ASSESSMENT  Date: 11/16/23 Score  GAIT LEVEL SURFACE Instructions: Walk at your normal speed from here to the next mark (6 m) [20 ft]. (1) Moderate impairment-Walks 6 m (20 ft), slow speed, abnormal gait pattern, evidence for imbalance, or deviates 25.4 - 38.1 cm (10 -15 in) outside of the 30.48-cm (12-in) walkway  width. Requires more than 7 seconds to ambulate 6 m (20 ft). 7.97 seconds  2.   CHANGE IN GAIT SPEED Instructions: Begin walking at your normal pace (for 1.5 m [5 ft]). When I tell you "go," walk as fast as you can (for 1.5 m [5 ft]). When I tell you "slow," walk as slowly as you can (for 1.5 m [5 ft]. (3) Normal - Able to smoothly change walking speed without loss of balance or gait deviation. Shows a  significant difference in walking speeds between normal, fast, and slow speeds. Deviates no more than 15.24 cm (6 in) outside of the 30.48-cm (12-in) walkway width.  3.    GAIT WITH HORIZONTAL HEAD TURNS Instructions: Walk from here to the next mark 6 m (20 ft) away. Begin walking at your normal pace. Keep walking straight; after 3 steps, turn your head to the right and keep walking straight while looking to the right. After 3 more steps, turn your head to the left and keep walking straight while looking left. Continue alternating looking right and left. (3) Normal - Performs head turns smoothly with no change in gait. Deviates no more than 15.24 cm (6 in) outside 30.48-cm (12-in) walkway width.  4.   GAIT WITH VERTICAL HEAD TURNS Instructions: Walk from here to the next mark (6 m [20 ft]). Begin walking at your normal pace. Keep walking straight; after 3 steps, tip your head up and keep walking straight while looking up. After 3 more steps, tip your head down, keep walking straight while looking down. Continue  alternating looking up and down every 3 steps until you have completed 2 repetitions in each direction. (2) Mild impairment - Performs task with slight change in gait velocity (eg, minor disruption to smooth gait path), deviates 15.24 -25.4 cm (6 -10 in) outside 30.48-cm (12-in) walkway width or uses assistive device.  5.  GAIT AND PIVOT TURN Instructions: Begin with walking at your normal pace. When I tell you, "turn and stop," turn as quickly as you can to face the opposite direction and stop. (3) Normal - Pivot turns safely within 3 seconds and stops quickly with no loss of balance  6.   STEP OVER OBSTACLE Instructions: Begin walking at your normal speed. When you come to the shoe box, step over it, not around it, and keep walking. (2) Mild impairment - Is able to step over one shoe box (11.43 cm [4.5 in] total height) without changing gait speed; no evidence of imbalance.  7.   GAIT WITH NARROW  BASE OF SUPPORT Instructions: Walk on the floor with arms folded across the chest, feet aligned heel to toe in tandem for a distance of 3.6 m [12 ft]. The number of steps taken in a straight line are counted for a maximum of 10 steps. (2) Mild impairment - Ambulates 7-9 steps  8.   GAIT WITH EYES CLOSED Instructions: Walk at your normal speed from here to the next mark (6 m [20 ft]) with your eyes closed. (1) Moderate impairment - Walks 6 m (20 ft), slow speed, abnormal gait pattern, evidence for imbalance, deviates 25.4 -38.1 cm (10 -15 in) outside 30.48-cm (12-in) walkway width. Requires more than 9 seconds to ambulate 6 m (20 ft).12.94 seconds  9.   AMBULATING BACKWARDS Instructions: Walk backwards until I tell you to stop (2) Mild impairment - Walks 6 m (20 ft), uses assistive device, slower speed, mild gait deviations, deviates 15.24 -25.4 cm (6 -10 in) outside 30.48-cm (  12-in) walkway width 21.69 seconds  10. STEPS Instructions: Walk up these stairs as you would at home (ie, using the rail if necessary). At the top turn around and walk down. (2) Mild impairment-Alternating feet, must use rail.  Total 21/30   Interpretation of scores: Non-Specific Older Adults Cutoff Score: <=22/30 = risk of falls Parkinson's Disease Cutoff score <15/30= fall risk (Hoehn & Yahr 1-4)  Minimally Clinically Important Difference (MCID)  Stroke (acute, subacute, and chronic) = MDC: 4.2 points Vestibular (acute) = MDC: 6 points Community Dwelling Older Adults =  MCID: 4 points Parkinson's Disease  =  MDC: 4.3 points  (Academy of Neurologic Physical Therapy (nd). Functional Gait Assessment. Retrieved from https://www.neuropt.org/docs/default-source/cpgs/core-outcome-measures/function-gait-assessment-pocket-guide-proof9-(2).pdf?sfvrsn=b33f35043_0.)  PATIENT SURVEYS:  LEFS: 43.75% (11/16/23)  Extreme difficulty/unable (0), Quite a bit of difficulty (1), Moderate difficulty (2), Little difficulty (3), No  difficulty (4) Survey date:  11/16/23  Any of your usual work, housework or school activities 1  2. Usual hobbies, recreational or sporting activities 2  3. Getting into/out of the bath 1  4. Walking between rooms 4  5. Putting on socks/shoes 1  6. Squatting  2  7. Lifting an object, like a bag of groceries from the floor 4  8. Performing light activities around your home 3  9. Performing heavy activities around your home 1  10. Getting into/out of a car 4  11. Walking 2 blocks 3  12. Walking 1 mile 1  13. Going up/down 10 stairs (1 flight) 3  14. Standing for 1 hour 0  15.  sitting for 1 hour 3  16. Running on even ground 0  17. Running on uneven ground 0  18. Making sharp turns while running fast 0  19. Hopping  0  20. Rolling over in bed 2 (d/t pregnancy)  Score total:  35/80      Vitals:  Beginning of session: Vitals:   11/16/23 1540  BP: (!) 99/54  Pulse: (!) 57    End of session: BP R UE sitting 106/57 with HR 58 bpm                                                                                                                              TREATMENT DATE: 11/16/23  SELF CARE: BP and HR taken in sitting at rest beginning of session and end of session (see above for details) LEFS survey (see above for details)  Therapeutic Activity: FGA (see above for details)  Therapeutic Exercise: Standing marching x10 reps B LE's with UE support on // bar. Standing hip abduction x6 reps B LE's (limited d/t hip discomfort R>L); B UE support on // bar Standing heel raises x10 reps B LE's; B UE support on // bar Issued HEP (see below for details)  PATIENT EDUCATION: Education details: Pacing/activity modification and HEP modification based on energy level; recommend being able to hold conversation during activities/exercise in order not to [employer] self Person educated: Patient Education  method: Explanation Education comprehension: verbalized understanding  HOME EXERCISE  PROGRAM: 11/16/23 Access Code: [email or web address] URL: [email or web address] Date: 11/16/2023 Prepared by: Damien Caulk  Exercises - Standing March with Counter Support  - 1 x daily - 5 x weekly - 1-2 sets - 10 reps - Heel Raises with Counter Support  - 1 x daily - 5 x weekly - 1-2 sets - 10 reps  GOALS: Goals reviewed with patient? Yes  SHORT TERM GOALS: Equals long term goals based on length of POC  LONG TERM GOALS: Target date: 12/10/2023  Pt will be independent with final HEP in order to improve strength and balance in order to decrease fall risk and improve function at home for ADL's.  Baseline: To be issued (not issued d/t time) Goal status: INITIAL  2.  Pt will decrease 5 Time Sit to Stand by at least 3 seconds in order to demonstrate clinically significant improvement in LE strength.  Baseline: 19.37 seconds (Eval) Goal status: INITIAL  3.  Assess FGA and update FGA goal based on results. Baseline: 21/30 (11/16/23) Goal status: INITIAL  4.  Pt will increase LEFS by at least 9 points in order to demonstrate significant improvement in lower extremity function.   Baseline: 35/80 (11/16/23) Goal status: INITIAL  5. Patient will be able to perform household work/chores without increase in L knee symptoms.  Baseline: L knee pain increases with activities.  Goal status: INITIAL    ASSESSMENT:  CLINICAL IMPRESSION: Patient was seen today for physical therapy treatment to address strength, balance, and functional mobility.  Focused session on FGA, LEFS survey, and issuing HEP.  Pt scored 21/30 on FGA indicating pt may be at increased risk of falls.  Pt scored 35/80 on LEFS survey (anticipate results impacted by L knee pain, L LE weakness s/p stroke, and modifications to activities d/t pregnancy).  Patient continues to be limited by impaired strength, endurance, and balance.  Pt appearing cautious with activities during session when challenged with balance.  Minimal L  knee pain reported throughout session.  They would continue to benefit from skilled PT to address impairments as noted and progress towards long term goals.   OBJECTIVE IMPAIRMENTS: Abnormal gait, decreased activity tolerance, decreased balance, decreased coordination, decreased endurance, decreased mobility, decreased strength, and pain.   ACTIVITY LIMITATIONS: carrying, lifting, bending, standing, squatting, stairs, locomotion level, and caring for others  PARTICIPATION LIMITATIONS: cleaning, shopping, community activity, occupation, yard work, and church  PERSONAL FACTORS: Past/current experiences, Time since onset of injury/illness/exacerbation, and 3+ comorbidities: R MCA stroke, migraine HA, L knee pain, R wrist pain are also affecting patient's functional outcome.   REHAB POTENTIAL: Good  CLINICAL DECISION MAKING: Evolving/moderate complexity  EVALUATION COMPLEXITY: Moderate  PLAN:  PT FREQUENCY: 2x/week  PT DURATION: 4 weeks  PLANNED INTERVENTIONS: [id number]- PT Re-evaluation, 97750- Physical Performance Testing, 97110-Therapeutic exercises, 97530- Therapeutic activity, [id number]- Neuromuscular re-education, 97535- Self Care, [id number]- Manual therapy, [id number]- Gait training, [phone]- Orthotic Initial, [phone]- Orthotic/Prosthetic subsequent, [phone]- Canalith repositioning, [phone]- Aquatic Therapy, Patient/Family education, Balance training, Stair training, Taping, Vestibular training, DME instructions, Cryotherapy, and Moist heat  PLAN FOR NEXT SESSION: Sci-Fit; L LE strengthening; pain control; balance; progress HEP; walking program   Damien Caulk, PT 11/16/2023, 10:12 PM

## 2023-11-18 ENCOUNTER — Ambulatory Visit: Admitting: Licensed Clinical Social Worker

## 2023-11-18 ENCOUNTER — Ambulatory Visit: Admitting: Physical Therapy

## 2023-11-18 ENCOUNTER — Encounter: Payer: Self-pay | Admitting: Physical Therapy

## 2023-11-18 ENCOUNTER — Ambulatory Visit

## 2023-11-18 VITALS — BP 106/58 | HR 64

## 2023-11-18 DIAGNOSIS — M25562 Pain in left knee: Secondary | ICD-10-CM

## 2023-11-18 DIAGNOSIS — R2689 Other abnormalities of gait and mobility: Secondary | ICD-10-CM

## 2023-11-18 DIAGNOSIS — R29898 Other symptoms and signs involving the musculoskeletal system: Secondary | ICD-10-CM

## 2023-11-18 DIAGNOSIS — R278 Other lack of coordination: Secondary | ICD-10-CM

## 2023-11-18 DIAGNOSIS — R41840 Attention and concentration deficit: Secondary | ICD-10-CM

## 2023-11-18 DIAGNOSIS — I69354 Hemiplegia and hemiparesis following cerebral infarction affecting left non-dominant side: Secondary | ICD-10-CM

## 2023-11-18 DIAGNOSIS — F411 Generalized anxiety disorder: Secondary | ICD-10-CM | POA: Diagnosis not present

## 2023-11-18 DIAGNOSIS — M6281 Muscle weakness (generalized): Secondary | ICD-10-CM

## 2023-11-18 DIAGNOSIS — R208 Other disturbances of skin sensation: Secondary | ICD-10-CM

## 2023-11-18 NOTE — Therapy (Signed)
 OUTPATIENT PHYSICAL THERAPY NEURO TREATMENT   Patient Name: Kristen Grant MRN: [id number] DOB:Nov 12, 1995, 28 y.o., female Today's Date: 11/18/2023   PCP: No PCP REFERRING PROVIDER: [name], Grant  END OF SESSION:  PT End of Session - 11/18/23 1624     Visit Number 3    Number of Visits 9   8 plus Eval   Date for Recertification  12/17/23   For scheduling delays   Authorization Type [place] MEDICAID [facility]    PT Start Time 1617    PT Stop Time 1701    PT Time Calculation (min) 44 min    Equipment Utilized During Treatment Gait belt   pt had own soft knee brace L LE on; gait belt positioned under armpits   Activity Tolerance Patient tolerated treatment well    Behavior During Therapy WFL for tasks assessed/performed          Past Medical History:  Diagnosis Date   Anxiety    Asthma    no treatments required at this time   Chlamydia 2023   Depression    Gonorrhea 2024   Migraine headache    Missed abortion with fetal demise before 20 completed weeks of gestation 04/12/2023   Nicotine vapor product user 10/06/2019   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   Stroke (HCC)    hx 2 ischemic strokesin September 2022--residual weakness to L face, arm and leg   Past Surgical History:  Procedure Laterality Date   BUBBLE STUDY  10/17/2020   Procedure: BUBBLE STUDY;  Surgeon: Kristen Oneil BROCKS, Grant;  Location: [facility] ENDOSCOPY;  Service: Cardiovascular;;   IR ANGIO INTRA EXTRACRAN SEL COM CAROTID INNOMINATE UNI L MOD SED  10/11/2020   IR ANGIO VERTEBRAL SEL VERTEBRAL UNI L MOD SED  10/11/2020   IR CT HEAD LTD  10/11/2020   IR CT HEAD LTD  10/12/2020   IR PERCUTANEOUS ART THROMBECTOMY/INFUSION INTRACRANIAL INC DIAG ANGIO  10/11/2020   IR PERCUTANEOUS ART THROMBECTOMY/INFUSION INTRACRANIAL INC DIAG ANGIO  10/12/2020   NO PAST SURGERIES     PATENT FORAMEN OVALE(PFO) CLOSURE N/A 12/10/2020   Procedure: PATENT FORAMEN OVALE (PFO) CLOSURE;  Surgeon: Kristen Heinz, Grant;  [facility]: [facility]  INVASIVE CV LAB;  Service: Cardiovascular;  Laterality: N/A;   PATENT FORAMEN OVALE(PFO) CLOSURE N/A 06/16/2023   Procedure: PATENT FORAMEN OVALE(PFO) CLOSURE;  Surgeon: Kristen Heinz, Grant;  Location: [facility] INVASIVE CV LAB;  Service: Cardiovascular;  Laterality: N/A;   RADIOLOGY WITH ANESTHESIA N/A 10/12/2020   Procedure: RADIOLOGY WITH ANESTHESIA;  Surgeon: Kristen Grant;  Location: [facility] OR;  Service: Radiology;  Laterality: N/A;   RADIOLOGY WITH ANESTHESIA N/A 10/11/2020   Procedure: IR WITH ANESTHESIA;  Surgeon: Kristen Grant;  Location: [facility] OR;  Service: Radiology;  Laterality: N/A;   TEE WITHOUT CARDIOVERSION N/A 10/17/2020   Procedure: TRANSESOPHAGEAL ECHOCARDIOGRAM (TEE);  Surgeon: Kristen Oneil BROCKS, Grant;  Location: [facility] ENDOSCOPY;  Service: Cardiovascular;  Laterality: N/A;   TRANSESOPHAGEAL ECHOCARDIOGRAM (CATH LAB) N/A 05/05/2023   Procedure: TRANSESOPHAGEAL ECHOCARDIOGRAM;  Surgeon: Kristen Vinie BROCKS, Grant;  [facility]: [facility] INVASIVE CV LAB;  Service: Cardiovascular;  Laterality: N/A;   Patient Active Problem List   Diagnosis Date Noted   Supervision of high risk pregnancy, antepartum 03/22/2023   Breast mass 09/09/2022   Depression/anxiety 04/22/2021   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   Cannabis use disorder, severe, dependence (HCC) daily 03/24/2021   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   History of stroke x2 10/2020  12/16/2020   Depression, major, single episode, moderate (HCC) 12/16/2020   PFO (patent foramen ovale) 12/09/2020   Anemia    Hemiparesis affecting left side as late effect of stroke (HCC)    Leukocytosis    Dyslipidemia    Migraines  05/09/2019   LGSIL on Pap smear of cervix 08/18/2018   Generalized anxiety disorder 07/26/2017   ADD (attention deficit disorder) 07/07/2017   Maternal varicella, non-immune 08/01/2015    ONSET DATE: Stroke Sept 2022; L knee pain started last May 2024  REFERRING DIAG: P30.645  (ICD-10-CM) - Hemiparesis affecting left side as late effect of stroke (HCC)  THERAPY DIAG:  Other lack of coordination  Muscle weakness (generalized)  Other symptoms and signs involving the musculoskeletal system  Hemiparesis affecting left side as late effect of stroke (HCC)  Other abnormalities of gait and mobility  Left knee pain, unspecified chronicity  Other disturbances of skin sensation  Rationale for Evaluation and Treatment: Rehabilitation  SUBJECTIVE:                                                                                                                                                                                             SUBJECTIVE STATEMENT:  Pt received after OT session.  Pt reports she was napping prior to therapy today.  She denies pain or falls since visit prior.  She had some mild light headedness and a slight headache earlier today, but this has resolved.  She did not wear her knee sleeve today. Pt accompanied by: self  PERTINENT HISTORY:  Per Kristen Prentice BRAVO, Grant note 11/05/23: Hx R MCA infarct in 2022, patient completed both inpatient and outpatient rehabilitation at that time had very good outcome returning to work.  Despite that she did have some persistent left hand incoordination and mild left lower extremity weakness.   No longer working at [employer], she tried working at another job but this had longer hours.  She then became pregnant and now no longer works.  She does walk about 7000 steps a day but does no other exercise   The patient has had no falls or injuries.  Her left wrist feels okay her right wrist has dorsal pain that she notes mainly when she is driving.  She has no numbness or tingling in the fingers at night or while she is driving.  She has noted no swelling in the wrist joint.  No elbow pain. Left knee pain is relieved by an elastic knee sleeve.  She states that it is mainly in the front of the knee rather than behind the  knee.  She has not  noted swelling of her knee.  No numbness tingling shooting down the left lower extremity from her back.  PMH includes asthma, migraine HA, R MCA stroke (h/o 2 ischemic strokes in Sept 2022--residual weakness to L face, arm, and leg), PFO closure.  Not currently on anti-coagulation.  PAIN:  Are you having pain? Yes: NPRS scale: 0/10 at rest; 0/10 with activity Pain location: L knee Pain description: fatigue Aggravating factors: working/activity Relieving factors: elevation  PRECAUTIONS: Other: Pregnancy (No specific precautions due to pregnancy per CNM 11/11/23)  RED FLAGS: None   WEIGHT BEARING RESTRICTIONS: No  FALLS: Has patient fallen in last 6 months? No  LIVING ENVIRONMENT: Lives with: lives with their family (pt's mother), lives with their partner, and lives with their son (55 y.o.) Lives in: House/apartment Stairs: Yes: External: 3 steps; none Has following equipment at home: shower chair  PLOF: Independent and Needs assistance with ADLs; boyfriend helps tie shoes or assists with fine motor activities with L hand; (+) driving; currently not working  PATIENT GOALS: To not have to use L knee brace; improve L knee pain.  Improve balance (in/out of shower) and L LE strength.  OBJECTIVE:  Note: Objective measures were completed at Evaluation unless otherwise noted.  DIAGNOSTIC FINDINGS:  CT Head 08/29/23: 1. No evidence of acute intracranial abnormality. 2. Chronic right MCA infarct. 3. Right sphenoid sinus fluid, correlate for acute sinusitis.  COGNITION: Overall cognitive status: Within functional limits for tasks assessed   SENSATION: Light touch: WFL B LE's  COORDINATION: L LE fatigues with decreased quality with rapid alternating toe taps (in sitting)  EDEMA:  WNL B LE's  MUSCLE TONE: Catch noted with quick stretch to L ankle into DF (normal R)  POSTURE: No Significant postural limitations  LOWER EXTREMITY ROM:     Active  Right Eval  Left Eval  Hip flexion WNL WNL  Hip extension    Hip abduction    Hip adduction    Hip internal rotation    Hip external rotation    Knee flexion WNL WNL  Knee extension WNL WNL  Ankle dorsiflexion WNL WNL  Ankle plantarflexion WNL WNL  Ankle inversion    Ankle eversion     (Blank rows = not tested)  LOWER EXTREMITY MMT:    MMT Right Eval Left Eval  Hip flexion 4+/5 4+/5  Hip extension    Hip abduction    Hip adduction    Hip internal rotation    Hip external rotation    Knee flexion 4+/5 4+/5  Knee extension 4+/5 4+/5  Ankle dorsiflexion 4+/5 4+/5  Ankle plantarflexion    Ankle inversion    Ankle eversion    (Blank rows = not tested)  BED MOBILITY:  Reports being independent but sometimes takes extra time if gets dizzy (dizziness goes away pretty quick)  TRANSFERS: Sit to stand: Complete Independence  Assistive device utilized: None     Stand to sit: Complete Independence  Assistive device utilized: None      STAIRS: Not tested GAIT: Findings: Gait Characteristics: step through pattern and decreased ankle dorsiflexion- Left, Distance walked: clinic distances, Assistive device utilized:None, Level of assistance: Complete Independence, and Comments: pt reports L LE feeling weak during ambulation  FUNCTIONAL TESTS:  5 times sit to stand: 19.37 seconds; no UE support FGA: 21/30 (11/16/23)  FUNCTIONAL GAIT ASSESSMENT  Date: 11/16/23 Score  GAIT LEVEL SURFACE Instructions: Walk at your normal speed from here to the next mark (6 m) [20 ft]. (1) Moderate  impairment-Walks 6 m (20 ft), slow speed, abnormal gait pattern, evidence for imbalance, or deviates 25.4 - 38.1 cm (10 -15 in) outside of the 30.48-cm (12-in) walkway width. Requires more than 7 seconds to ambulate 6 m (20 ft). 7.97 seconds  2.   CHANGE IN GAIT SPEED Instructions: Begin walking at your normal pace (for 1.5 m [5 ft]). When I tell you "go," walk as fast as you can (for 1.5 m [5 ft]). When I tell you  "slow," walk as slowly as you can (for 1.5 m [5 ft]. (3) Normal - Able to smoothly change walking speed without loss of balance or gait deviation. Shows a significant difference in walking speeds between normal, fast, and slow speeds. Deviates no more than 15.24 cm (6 in) outside of the 30.48-cm (12-in) walkway width.  3.    GAIT WITH HORIZONTAL HEAD TURNS Instructions: Walk from here to the next mark 6 m (20 ft) away. Begin walking at your normal pace. Keep walking straight; after 3 steps, turn your head to the right and keep walking straight while looking to the right. After 3 more steps, turn your head to the left and keep walking straight while looking left. Continue alternating looking right and left. (3) Normal - Performs head turns smoothly with no change in gait. Deviates no more than 15.24 cm (6 in) outside 30.48-cm (12-in) walkway width.  4.   GAIT WITH VERTICAL HEAD TURNS Instructions: Walk from here to the next mark (6 m [20 ft]). Begin walking at your normal pace. Keep walking straight; after 3 steps, tip your head up and keep walking straight while looking up. After 3 more steps, tip your head down, keep walking straight while looking down. Continue  alternating looking up and down every 3 steps until you have completed 2 repetitions in each direction. (2) Mild impairment - Performs task with slight change in gait velocity (eg, minor disruption to smooth gait path), deviates 15.24 -25.4 cm (6 -10 in) outside 30.48-cm (12-in) walkway width or uses assistive device.  5.  GAIT AND PIVOT TURN Instructions: Begin with walking at your normal pace. When I tell you, "turn and stop," turn as quickly as you can to face the opposite direction and stop. (3) Normal - Pivot turns safely within 3 seconds and stops quickly with no loss of balance  6.   STEP OVER OBSTACLE Instructions: Begin walking at your normal speed. When you come to the shoe box, step over it, not around it, and keep walking. (2) Mild  impairment - Is able to step over one shoe box (11.43 cm [4.5 in] total height) without changing gait speed; no evidence of imbalance.  7.   GAIT WITH NARROW BASE OF SUPPORT Instructions: Walk on the floor with arms folded across the chest, feet aligned heel to toe in tandem for a distance of 3.6 m [12 ft]. The number of steps taken in a straight line are counted for a maximum of 10 steps. (2) Mild impairment - Ambulates 7-9 steps  8.   GAIT WITH EYES CLOSED Instructions: Walk at your normal speed from here to the next mark (6 m [20 ft]) with your eyes closed. (1) Moderate impairment - Walks 6 m (20 ft), slow speed, abnormal gait pattern, evidence for imbalance, deviates 25.4 -38.1 cm (10 -15 in) outside 30.48-cm (12-in) walkway width. Requires more than 9 seconds to ambulate 6 m (20 ft).12.94 seconds  9.   AMBULATING BACKWARDS Instructions: Walk backwards until I tell you  to stop (2) Mild impairment - Walks 6 m (20 ft), uses assistive device, slower speed, mild gait deviations, deviates 15.24 -25.4 cm (6 -10 in) outside 30.48-cm (12-in) walkway width 21.69 seconds  10. STEPS Instructions: Walk up these stairs as you would at home (ie, using the rail if necessary). At the top turn around and walk down. (2) Mild impairment-Alternating feet, must use rail.  Total 21/30   Interpretation of scores: Non-Specific Older Adults Cutoff Score: <=22/30 = risk of falls Parkinson's Disease Cutoff score <15/30= fall risk (Hoehn & Yahr 1-4)  Minimally Clinically Important Difference (MCID)  Stroke (acute, subacute, and chronic) = MDC: 4.2 points Vestibular (acute) = MDC: 6 points Community Dwelling Older Adults =  MCID: 4 points Parkinson's Disease  =  MDC: 4.3 points  (Academy of Neurologic Physical Therapy (nd). Functional Gait Assessment. Retrieved from https://www.neuropt.org/docs/default-source/cpgs/core-outcome-measures/function-gait-assessment-pocket-guide-proof9-(2).pdf?sfvrsn=b37f35043_0.)  PATIENT  SURVEYS:  LEFS: 35/80; 43.75% (11/16/23)  Extreme difficulty/unable (0), Quite a bit of difficulty (1), Moderate difficulty (2), Little difficulty (3), No difficulty (4) Survey date:  11/16/23  Any of your usual work, housework or school activities 1  2. Usual hobbies, recreational or sporting activities 2  3. Getting into/out of the bath 1  4. Walking between rooms 4  5. Putting on socks/shoes 1  6. Squatting  2  7. Lifting an object, like a bag of groceries from the floor 4  8. Performing light activities around your home 3  9. Performing heavy activities around your home 1  10. Getting into/out of a car 4  11. Walking 2 blocks 3  12. Walking 1 mile 1  13. Going up/down 10 stairs (1 flight) 3  14. Standing for 1 hour 0  15.  sitting for 1 hour 3  16. Running on even ground 0  17. Running on uneven ground 0  18. Making sharp turns while running fast 0  19. Hopping  0  20. Rolling over in bed 2 (d/t pregnancy)  Score total:  35/80      Vitals:  Beginning of session: Vitals:   11/18/23 1625  BP: (!) 106/58  Pulse: 64     End of session: BP R UE sitting 106/57 with HR 58 bpm                                                                                                                              TREATMENT DATE: 11/18/23  SELF CARE: BP and HR taken in sitting at rest beginning of session and end of session (see above for details) SciFit multi-peaks mode x8 minutes using BUE/BLE up to level 5.0 for global strength, reciprocal mobility, and endurance. You Can Walk For A Certain Length Of Time Each Day                          Walk 10 minutes 1-2 times per day.  Increase 3-4  minutes every 7-14 days              Work up to 20-30 minutes (1-2 times per day).               Example:                         Day 1-2           4-5 minutes     3 times per day                         Day 7-8           10-12 minutes 2-3 times per day                         Day  13-14       20-22 minutes 1-2 times per day  NMR: 6 Blaze pods on random one color taps  setting for improved coordination and SLS.  Performed on 1 minute intervals with 30 seconds rest periods.  Pt requires SBA guarding. Round 1:  flat ground linear side-stepping setup.  23 hits. Round 2:   setup.  18 hits. Round 3:   setup.  18 hits. Notable errors/deficits:  Pt reports fatigue following task requiring seated recovery, noted mildly increased left toe drag with ambulation following task - resolves w/ rest.   6 Blaze pods on random one color taps  setting for improved coordination and SLS.  Performed on 1 minute intervals with 30 seconds rest periods.  Pt requires SBA guarding. Round 1:  3 step (RLE taps) setup.  53 hits. Round 2:   setup (LLE taps).  46 hits. Round 3:   setup (alt taps).  50 hits. Notable errors/deficits:  Pt reports fatigue following task requiring seated recovery, she has quick fatigue of hip flexors during task requiring time to stretch throughout task.   Alternating 8 inch cone taps in sitting x20 > alternating LE cone tap w/ contralateral UE raise overhead x10 each side  PATIENT EDUCATION: Education details: Continue HEP as able.  Plan to clear pt w/ OB for aquatic therapy - future therapist to coordinate moving appts w/ aquatic therapist once pt cleared.  Goal of aquatics NOT HEP but more pain and mobility for the time being.  Discussed foot-up brace, but pt does not regularly wear tennis or lace up style shoes so may have to explore alternative styles to determine if these could be a viable option for pt when fatigued or on extremely unlevel surfaces. Person educated: Patient Education method: Explanation Education comprehension: verbalized understanding  HOME EXERCISE PROGRAM: 11/16/23 Access Code: [email or web address] URL: [email or web address] Date: 11/16/2023 Prepared by: Damien Caulk  Exercises - Standing March with Counter Support  - 1 x daily - 5  x weekly - 1-2 sets - 10 reps - Heel Raises with Counter Support  - 1 x daily - 5 x weekly - 1-2 sets - 10 reps  GOALS: Goals reviewed with patient? Yes  SHORT TERM GOALS: Equals long term goals based on length of POC  LONG TERM GOALS: Target date: 12/10/2023  Pt will be independent with final HEP in order to improve strength and balance in order to decrease fall risk and improve function at home for ADL's.  Baseline: To be issued (not issued d/t time) Goal status: INITIAL  2.  Pt will  decrease 5 Time Sit to Stand by at least 3 seconds in order to demonstrate clinically significant improvement in LE strength.  Baseline: 19.37 seconds (Eval) Goal status: INITIAL  3.  Assess FGA and update FGA goal based on results. Baseline: 21/30 (11/16/23) Goal status: INITIAL  4.  Pt will increase LEFS by at least 9 points in order to demonstrate significant improvement in lower extremity function.   Baseline: 35/80 (11/16/23) Goal status: INITIAL  5. Patient will be able to perform household work/chores without increase in L knee symptoms.  Baseline: L knee pain increases with activities.  Goal status: INITIAL    ASSESSMENT:  CLINICAL IMPRESSION: Emphasis of skilled session on continued work on coordination of BLE and SLS.  She presents with ongoing fatigue limiting progression of standing activities.  Her left foot drop worsens with fatigue.  She may benefit from aquatic services for strength, balance, and pain management and was interested when discussed today.  Due to nature of high risk pregnancy and prior CVA, primary PT to reach out to [facility] to clear patient for modality.  Will continue per POC as able.   OBJECTIVE IMPAIRMENTS: Abnormal gait, decreased activity tolerance, decreased balance, decreased coordination, decreased endurance, decreased mobility, decreased strength, and pain.   ACTIVITY LIMITATIONS: carrying, lifting, bending, standing, squatting, stairs, locomotion level, and  caring for others  PARTICIPATION LIMITATIONS: cleaning, shopping, community activity, occupation, yard work, and church  PERSONAL FACTORS: Past/current experiences, Time since onset of injury/illness/exacerbation, and 3+ comorbidities: R MCA stroke, migraine HA, L knee pain, R wrist pain are also affecting patient's functional outcome.   REHAB POTENTIAL: Good  CLINICAL DECISION MAKING: Evolving/moderate complexity  EVALUATION COMPLEXITY: Moderate  PLAN:  PT FREQUENCY: 2x/week  PT DURATION: 4 weeks  PLANNED INTERVENTIONS: [id number]- PT Re-evaluation, 97750- Physical Performance Testing, 97110-Therapeutic exercises, 97530- Therapeutic activity, [id number]- Neuromuscular re-education, 97535- Self Care, [id number]- Manual therapy, [id number]- Gait training, [phone]- Orthotic Initial, [phone]- Orthotic/Prosthetic subsequent, [phone]- Canalith repositioning, [phone]- Aquatic Therapy, Patient/Family education, Balance training, Stair training, Taping, Vestibular training, DME instructions, Cryotherapy, and Moist heat  PLAN FOR NEXT SESSION: Sci-Fit; L LE strengthening; pain control; balance; progress HEP; walking program; Damien please reach out to pt OB about aquatic clearance and notify Jayde Daffin about aquatic appts (pt is able to do Thurs. Mornings so will plan to schedule 3-4 visits) - once scheduled provide aquatic handouts.  Foot-up brace?   Daved KATHEE Bull, PT, DPT 11/18/2023, 5:19 PM

## 2023-11-18 NOTE — Progress Notes (Signed)
 Counselor/Therapist Progress Note  Patient ID: Kristen Grant, MRN: [id number],    Date: 11/18/2023  Time Spent: 60 total minutes. I spent 45 minutes face to face with the patient on the date of service. I spent an additional 15  minutes on pre- and post-visit activities on the date of service including collateral, chart review, team discussion, and documentation.    Treatment Type: Individual Therapy  Reported Symptoms: Anxiety, anxious thoughts, intrusive thoughts, compulsion behaviors; depressed mood, anhedonia   Mental Status Exam:  Appearance:   Casual and Neat     Behavior:  Appropriate, Sharing, and Motivated  Motor:  Normal  Speech/Language:   Clear and Coherent and Normal Rate  Affect:  Appropriate and Congruent  Mood:  normal  Thought process:  normal  Thought content:    WNL  Sensory/Perceptual disturbances:    WNL  Orientation:  oriented to person, place, time/date, situation, and day of week  Attention:  Good  Concentration:  Good  Memory:  WNL  Fund of knowledge:   Good  Insight:    Fair  Judgment:   Fair  Impulse Control:  Fair   Risk Assessment: Danger to Self:  No Self-injurious Behavior: No Danger to Others: No Duty to Warn:no Physical Aggression / Violence:No  Access to Firearms a concern: No  Gang Involvement:No   Subjective: Patient reports she has been okay , she hasn't noticed a benefit from medication, no negative side effects. Patient was engaged and cooperative throughout the session discussing thoughts, emotions, treatment modality and plan. LCSW and patient reviewed treatment plan. Patient voices agreement with treatment plan.   Interventions: Cognitive Behavioral Therapy and Client Centered  Checked in with the patient and conducted a brief assessment of current symptoms, psychosocial stressors, and safety. Collaboratively set the session agenda. Reviewed content from the previous session, including assessment findings. Explored the patient's  treatment goals and continued to build rapport and understanding. Introduced and provided psychoeducation on the therapeutic modalities utilized, including an integrated approach (drawing from CBT, ACT, DBT, Mindfulness, and Somatic EMDR).   Reviewed core concepts and discussed how these approaches will support the patient's goals. Taught patient the connection between thoughts, emotions, physical sensations, and behaviors and led patient in mindfulness exercise. Provided therapeutic support through active listening, validation of emotional experiences, and by highlighting the patient's strengths.    Diagnosis:   ICD-10-CM   1. Generalized anxiety disorder  F41.1      Plan: Patient's goal of treatment is to get her thoughts better- more positive less negative, more positive feelings and thoughts.   Patient will reduce anxious physiological arousal during stressful events. Measurable Objective: Patient will report a decrease in subjective anxiety ratings (e.g., from 8/10 to 5/10)  Technique/Method: The patient will practice a calming, sensory-based exercise to bring awareness to the present moment. Patient will increase awareness of their present-moment experience. Measurable Objective: The patient will track their completion of a daily mindful practice and rate their experience in a journal at least 4 times per week. Technique/Method: Practice bringing attention to the breath or other bodily sensations to anchor oneself in the present. Patient will decrease the frequency of engaging with anxious negative thoughts. Measurable Objective: Patient will log the number of times per day they observe and detach from a negative thought using a specific technique, aiming for a 25% reduction in engagement with the thought over a 4-week period. Technique/Method: The patient will practice viewing thoughts as separate from themselves, like clouds passing in the sky.  Patient will reduce the impact of cognitive  distortions on their mood and behavior. Measurable Objective: The patient will identify and record two specific negative thought patterns per week and generate a more balanced alternative thought. Technique/Method: The patient will analyze unhelpful thought patterns and reframe them more realistically. Patient will increase engagement in activities that align with their core values. Measurable Objective: The patient will schedule and participate in at least one values-aligned activity per week, and track their mood before and after the activity. Technique/Method: The patient will identify what is most important to them and take committed action toward those areas of life, even if anxiety is present. Patient will enhance feelings of safety and calm in their body. Measurable Objective: The patient will practice a positive visualization or grounding exercise at least 5 times per week, documenting the resulting sense of calm. Technique/Method: The patient will use imagery and physical sensation to reinforce a sense of inner security and safety. Patient will enhance their focus on positive aspects of their life. Measurable Objective: The patient will consistently complete a gratitude exercise (e.g., listing 3 positive things) at least 5 times per week, tracking their experience. Technique/Method: The patient will engage in a regular practice of acknowledging and appreciating positive experiences to shift perspective.  Future Appointments  Date Time Provider Department Center  11/18/2023  3:30 PM Althia Longs, [place] [facility]  11/18/2023  4:15 PM Ulyses Daved NOVAK, PT [facility]  11/23/2023 12:30 PM Willadean Perkins, PT [facility]  11/23/2023  1:15 PM Althia Longs, OT [facility]  11/25/2023  9:00 AM Ellender Palma, LCSW [facility] None  11/25/2023  2:00 PM Althia Longs, OT [facility]  11/25/2023  2:45 PM Willadean Perkins, PT [facility]  11/26/2023  4:00 PM [facility] US  1  [facility] None  11/29/2023  9:35 AM Jayne Raisin L, CNM [facility] None  11/30/2023 11:00 AM Abelardo Burnard PARAS, OT [facility]  11/30/2023 11:45 AM Ulyses Daved NOVAK, PT [facility]  12/02/2023 12:30 PM Willadean Perkins, PT [facility]  12/02/2023  1:15 PM Althia Longs, OT [facility]  12/07/2023 12:30 PM Hauschild, Perkins, PT [facility]  12/07/2023  1:15 PM Abelardo Burnard PARAS, OT [facility]  12/09/2023 12:30 PM Willadean Perkins, PT [facility]  12/09/2023  1:15 PM Althia Longs, OT [facility]  12/10/2023  8:15 AM Jayne Raisin CROME, CNM [facility] None  12/13/2023  9:45 AM [facility] CONSULT RM [facility] None  12/13/2023 10:00 AM [facility] US1 [facility]  12/17/2023  9:30 AM Kirsteins, Prentice BRAVO, MD [facility] CPR  02/14/2024  7:45 AM Whitfield Raisin, NP [facility] None    Palma Ellender, LCSW

## 2023-11-18 NOTE — Patient Instructions (Signed)
 You Can Walk For A Certain Length Of Time Each Day                          Walk 10 minutes 1-2 times per day.             Increase 3-4  minutes every 7-14 days              Work up to 20-30 minutes (1-2 times per day).               Example:                         Day 1-2           4-5 minutes     3 times per day                         Day 7-8           10-12 minutes 2-3 times per day                         Day 13-14       20-22 minutes 1-2 times per day

## 2023-11-18 NOTE — Patient Instructions (Addendum)
 Basic activities  Use your affected hand to perform the following activities.  Stop activity if you experience pain.  Wipe tabletop  Bring plastic cup to mouth, then return to tabletop and release  Flip playing cards  Pick up cotton balls and place in a container  Toss ball  Rotate ball in your hand  Slide checkers on tabletop  Turn doorknob  Open/close cabinet door with handle  Pick up coins and place in a container  Fold towels  Stack blocks  Pick up 1 inch blocks  Pick up a pencil   Joint Protection Respect pain/Develop pain awareness Monitor activities - stop to rest when discomfort or fatigue develops Awareness of pain for activities prior to 12-24 hrs Pain lingering 2 hrs after activity is a warning sign - modify or eliminate Use larger joints and muscles Protect the delicate joints by using larger joints - use shoulder to carry bags not hands More proximal muscles and joints to perform ADL's (forearm/shoulder to open door-protect fingers) Groceries in paper bags Let the hip, elbow and arm support the grocery bag/push cart's weight Push up from seat with palms of hands, not back of fingers Avoid tight/strong and prolonged grasp Build up handles (foam or cloth) on utensils, tools and pens Modify handles with levers Use AE for assistance for opening jars, pens and books, turning knobs and keys Change grasp to avoid static positioning Work with fingers extended over a large sponge rather than squeezing it Use open, flat palm to open jars, pump bottles for shampoo/soaps Avoid carrying, lifting and using heavy objects Use a cart to move heavy objects Push heavy objects with hips instead of pulling with hands Distribute weight over many joints - use 2 hands to lift an item Use light weight tools and objects Use smaller containers of liquid or distribute liquids to smaller containers to lighten loads Balance rest and activity Plan rest breaks ahead of  time Avoid activities that cannot be stopped if needed Energy conservation techniques (sitting when able, planning ahead, organizing workspace and storage for accessibility, keeping most common used items within easy access, resting during activities, use timesavers like prepared foods) AE-Labor saving devices (modified cooking writing or cutting utensils, zipper pulls, button hooks, built up handle items, large grip pens, jar openers, key holder/turner, garden tools, and spring loaded scissors) Use splints as prescribes to protect joints

## 2023-11-18 NOTE — Therapy (Signed)
 OUTPATIENT OCCUPATIONAL THERAPY NEURO TREATMENT  Patient Name: Kristen Grant MRN: [id number] DOB:[birth year], 28 y.o., female Today's Date: 11/18/2023  PCP: none  REFERRING PROVIDER: [name], MD  END OF SESSION:  OT End of Session - 11/18/23 1632     Visit Number 3    Number of Visits 13    Date for Recertification  01/10/24    Authorization Type [employer] - awaiting auth    OT Start Time 1530    OT Stop Time 1615    OT Time Calculation (min) 45 min    Equipment Utilized During Treatment red theraputty    Activity Tolerance Patient tolerated treatment well    Behavior During Therapy WFL for tasks assessed/performed            Past Medical History:  Diagnosis Date   Anxiety    Asthma    no treatments required at this time   Chlamydia 2023   Depression    Gonorrhea 2024   Migraine headache    Missed abortion with fetal demise before 20 completed weeks of gestation 04/12/2023   Nicotine vapor product user 10/06/2019   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   Stroke (HCC)    hx 2 ischemic strokesin September 2022--residual weakness to L face, arm and leg   Past Surgical History:  Procedure Laterality Date   BUBBLE STUDY  10/17/2020   Procedure: BUBBLE STUDY;  Surgeon: Jeffrie Oneil BROCKS, MD;  Location: [facility];  Service: Cardiovascular;;   IR ANGIO INTRA EXTRACRAN SEL COM CAROTID INNOMINATE UNI L MOD SED  10/11/2020   IR ANGIO VERTEBRAL SEL VERTEBRAL UNI L MOD SED  10/11/2020   IR CT HEAD LTD  10/11/2020   IR CT HEAD LTD  10/12/2020   IR PERCUTANEOUS ART THROMBECTOMY/INFUSION INTRACRANIAL INC DIAG ANGIO  10/11/2020   IR PERCUTANEOUS ART THROMBECTOMY/INFUSION INTRACRANIAL INC DIAG ANGIO  10/12/2020   NO PAST SURGERIES     PATENT FORAMEN OVALE(PFO) CLOSURE N/A 12/10/2020   Procedure: PATENT FORAMEN OVALE (PFO) CLOSURE;  Surgeon: Ladona Heinz, MD;  Location: [facility];  Service: Cardiovascular;  Laterality: N/A;   PATENT FORAMEN OVALE(PFO)  CLOSURE N/A 06/16/2023   Procedure: PATENT FORAMEN OVALE(PFO) CLOSURE;  Surgeon: Ladona Heinz, MD;  Location: [facility];  Service: Cardiovascular;  Laterality: N/A;   RADIOLOGY WITH ANESTHESIA N/A 10/12/2020   Procedure: RADIOLOGY WITH ANESTHESIA;  Surgeon: Radiologist, Medication, MD;  Location: [facility] OR;  Service: Radiology;  Laterality: N/A;   RADIOLOGY WITH ANESTHESIA N/A 10/11/2020   Procedure: IR WITH ANESTHESIA;  Surgeon: Radiologist, Medication, MD;  Location: [facility] OR;  Service: Radiology;  Laterality: N/A;   TEE WITHOUT CARDIOVERSION N/A 10/17/2020   Procedure: TRANSESOPHAGEAL ECHOCARDIOGRAM (TEE);  Surgeon: Jeffrie Oneil BROCKS, MD;  Location: [facility] ENDOSCOPY;  Service: Cardiovascular;  Laterality: N/A;   TRANSESOPHAGEAL ECHOCARDIOGRAM (CATH LAB) N/A 05/05/2023   Procedure: TRANSESOPHAGEAL ECHOCARDIOGRAM;  Surgeon: Mona Vinie BROCKS, MD;  Location: [facility];  Service: Cardiovascular;  Laterality: N/A;   Patient Active Problem List   Diagnosis Date Noted   Supervision of high risk pregnancy, antepartum 03/22/2023   Breast mass 09/09/2022   Depression/anxiety 04/22/2021   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   Cannabis use disorder, severe, dependence (HCC) daily 03/24/2021   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   History of stroke x2 10/2020 12/16/2020   Depression, major, single episode, moderate (HCC) 12/16/2020   PFO (patent foramen ovale) 12/09/2020   Anemia  Hemiparesis affecting left side as late effect of stroke (HCC)    Leukocytosis    Dyslipidemia    Migraines  05/09/2019   LGSIL on Pap smear of cervix 08/18/2018   Generalized anxiety disorder 07/26/2017   ADD (attention deficit disorder) 07/07/2017   Maternal varicella, non-immune 08/01/2015    ONSET DATE: 11/05/2023  REFERRING DIAG: History of right MCA distribution infarct with residual mild left hemiparesis and motor control issues Note:  RIght wrist pain no trauma  as well as vestibular eval  THERAPY DIAG:  Other lack of coordination  Muscle weakness (generalized)  Attention and concentration deficit  Rationale for Evaluation and Treatment: Rehabilitation  SUBJECTIVE:   SUBJECTIVE STATEMENT: Pt arrived on time, stated I had just woken up from a nap. Denied any pain. Pt accompanied by: self  PERTINENT HISTORY: per MD note on 11/05/23: History of right MCA distribution infarct (2022) with residual mild left hemiparesis and motor control issues. Right wrist pain no evidence of trauma it is mainly in the dorsal area. She has a remote history of wrist fracture in 2008. She could certainly be developing some posttraumatic arthritis at this point though does not look like she has any joint tenderness or inflammation. Will send her to OT. Have advised wearing a Velcro wrist splint when driving  PRECAUTIONS: Other: pregnant - caution w/ modality use  WEIGHT BEARING RESTRICTIONS: No  PAIN:  Are you having pain? Rt wrist 0/10, intermittent w/ certain tasks aggravating it.   FALLS: Has patient fallen in last 6 months? No  LIVING ENVIRONMENT: Lives with: lives with their family (mom, boyfriend, 53 y.o. son)  Lives in: 1 level home Has following equipment at home: shower chair but doesn't use  PLOF: Independent with basic ADLs and stopped working since pregnant   PATIENT GOALS: decrease Rt wrist pain, use Lt hand more, braid hair and tie shoes better  OBJECTIVE:  Note: Objective measures were completed at Evaluation unless otherwise noted.  HAND DOMINANCE: Right  ADLs:  Eating: assist w/ cutting food Grooming: assist to wash hair, mod I for brushing teeth and brushing hair but can't braid hair UB Dressing: mod I  LB Dressing: mod I - but usually gets help to tied shoes Toileting: independent Bathing: distant supervision Tub Shower transfers: distant supervision because pt gets dizzy. Assist to get out of tub if pt bathes vs  shower. Equipment: has shower chair but doesn't use b/c it takes up too much room  IADLs: Shopping: pt orders online and then picks up  Light housekeeping: pt does laundry (boyfriend and mom does)  Meal Prep: boyfriend does mostly, but pt helps w/ side dishes - assist for pots Community mobility: drives [employer] Medication management: [employer] Financial management: boyfriend does Handwriting: no changes  MOBILITY STATUS: Independent    FUNCTIONAL OUTCOME MEASURES: Upper Extremity Functional Scale (UEFS): 48.75% functioning (51.25% deficit)   UPPER EXTREMITY ROM:  BUE AROM WFL's but difficulty w/ coordination Lt hand   UPPER EXTREMITY MMT:     MMT Right eval Left eval  Shoulder flexion 5 3+  Shoulder abduction 5 4  Shoulder adduction    Shoulder extension    Shoulder internal rotation 5 4  Shoulder external rotation 5 4  Middle trapezius    Lower trapezius    Elbow flexion    Elbow extension    Wrist flexion    Wrist extension    Wrist ulnar deviation    Wrist radial deviation    Wrist pronation  Wrist supination    (Blank rows = not tested)  HAND FUNCTION: Grip strength: Right: 62.1 lbs; Left: 32.6 lbs  COORDINATION: 9 Hole Peg test: Right: 21.52 sec; Left: 37.83 sec Box and Blocks:  Left 35 blocks w/ fatigue  SENSATION: Light touch: WFL Hot/Cold: Impaired  and diminished  EDEMA: none   COGNITION: Overall cognitive status: Within functional limits for tasks assessed  VISION: Subjective report: no changes but does have dizziness Baseline vision: No visual deficits Visual history: none  VISION ASSESSMENT: Not tested   PERCEPTION: Not tested  PRAXIS: Not tested  OBSERVATIONS: new Rt wrist pain which pt reports is due to overuse from not using Lt hand as much, pain mostly with driving, trying to hold pots/pans, and trying to hold/use cellphone. Decreased coordination Lt hand since stroke 2022 and some learned non use. Lt small finger abducts out and pt  doesn't use as much however does demo in hand manipulation for checkers                                                                                                                             TREATMENT DATE: 11/18/23   Educated pt in AE for various ADLs/IADLs including the following: zipper pulls, mandolines, automatic jar openers, key turners. Also educated pt in technique to reduce wrist strain while holding pots and pans, holding pan more towards the side rather than  holding straight out in front.   Educated pt in sensory precautions secondary to impaired sensation in L hand, as well as joint protection strategies for reduced strain in B hand and wrists, especially R hand and wrist.  Pt had reported R wrist pain when driving at eval, fitted for comfort cool thumb CMC brace at a size small to provide support to wrist while driving. Pt reported good fit and feeling of support, agreed to obtain and verbalized understanding that if any difficulty with donning brace to notify OT and practice donning/doffing as part of a tx session prn.   Pt educated in C-IMT and benefits for reducing strain and pain in R wrist and promote improved motor function in LUE. See Pt instructions for detailed handout.   Began theraputty HE for L hand strengthening, HEP provided and pt instructed to reduce sets as needed. Handout provided, removed the exercise for removing marbles from putty at this time to reduce added strain to R wrist and hand. May add on as a therapeutic activity as appropriate.    PATIENT EDUCATION: Education details: SEE ABOVE Person educated: Patient Education method: Explanation Education comprehension: verbalized understanding  HOME EXERCISE PROGRAM: 11/16/23: Coordination and Wrist isometrics (ACCESS CODE [id number])  11/18/23: Putty for L hand with red putty ([id number]) NOTE: DID REMOVE EXERCISE REMOVING MARBLES FROM PUTTY AT THIS TIME.   GOALS: Goals reviewed with patient?  Yes  SHORT TERM GOALS: Target date: 12/11/23  Independent with HEP for Lt hand coordination and grip strength Baseline: not yet addressed/issued Goal status: IN  PROGRESS  2.  Independent with isometric wrist strengthening HEP for Rt wrist  Baseline: not yet addressed/issued Goal status: IN PROGRESS  3.  Pt to verbalize understanding with task modifications and potential A/E to decrease pain Rt wrist and increase ease/independence and safety (possible wrist brace prn, modifications to steering wheel, cell phone attachment, cutting food, holding pot/pan, alternative shoelace options prn) Baseline: not yet addressed/issued Goal status: IN PROGRESS  4.  Pt to be independent with activities for Lt hand (m-CIMT activities) to promote Lt hand assist for tasks and to decrease learned non use  Baseline: not yet addressed/issued Goal status: IN PROGRESS  5.  Pt to verbalize understanding with safety considerations d/t sensory loss Lt hand Baseline: not yet addressed/issued Goal status: IN PROGRESS   LONG TERM GOALS: Target date: 01/10/24  Pt to report greater ease with tying shoes, holding cell phone, and using steering wheel w/ pain Rt wrist less than or equal to 4/10  Baseline: up to 8/10 Goal status: INITIAL  2.  Pt to simulate changing diaper in prep for new infant care at mod I level safely Baseline: not yet addressed/issued Goal status: INITIAL  3.  Pt to braid hair w/ task modifications and A/E prn  Baseline: dependent Goal status: INITIAL  4.  Pt to improve coordination Lt hand as evidenced by reducing speed on 9 hole peg test to 33 sec or less Baseline: 37 sec Goal status: INITIAL  5.  Pt to improve grip strength Lt hand to 38 lbs or greater to assist more in functional tasks Baseline: 32 lbs Goal status: INITIAL   ASSESSMENT:  CLINICAL IMPRESSION: Patient is a 28 y.o. female who was seen today for occupational therapy tx for Lt hemiparesis (from stroke in 2022) w/  some learned non use and new Rt wrist pain possibly from overuse and OA. Hx includes CVA. Pt is also currently pregnant. Pt expressed some interest in AE for reducing joint strain and reported improved comfort with orthotic fitting, agreed to search and obtain one. Pt tolerated c-imt well, agreed to attempt at home. Some concerns with carryover, however, as pt reported not completing issued exercises and activities since last tx stating I just forgot. Pt at this time would benefit from continued OT services to improve ability to complete ADL/IADL tasks with improved strength and coordination and reduced pain.   PERFORMANCE DEFICITS: in functional skills including ADLs, IADLs, coordination, dexterity, sensation, strength, pain, Fine motor control, Gross motor control, mobility, balance, body mechanics, decreased knowledge of use of DME, and UE functional use.   IMPAIRMENTS: are limiting patient from ADLs, IADLs, leisure, and childcare needs.   CO-MORBIDITIES: may have co-morbidities  that affects occupational performance. Patient will benefit from skilled OT to address above impairments and improve overall function.  MODIFICATION OR ASSISTANCE TO COMPLETE EVALUATION: No modification of tasks or assist necessary to complete an evaluation.  OT OCCUPATIONAL PROFILE AND HISTORY: Detailed assessment: Review of records and additional review of physical, cognitive, psychosocial history related to current functional performance.  CLINICAL DECISION MAKING: Moderate - several treatment options, min-mod task modification necessary  REHAB POTENTIAL: Good  EVALUATION COMPLEXITY: Moderate    PLAN:  OT FREQUENCY: 2x/week  OT DURATION: 6 weeks (plus eval)   PLANNED INTERVENTIONS: 97535 self care/ADL training, 02889 therapeutic exercise, 97530 therapeutic activity, 97112 neuromuscular re-education, 97140 manual therapy, 97018 paraffin, 02960 fluidotherapy, 97010 moist heat, 97010 cryotherapy, 97034  contrast bath, 97760 Orthotic Initial, [id number] Orthotic/Prosthetic subsequent, passive range of motion, patient/family education,  and DME and/or AE instructions  RECOMMENDED OTHER SERVICES: none at this time  CONSULTED AND AGREED WITH PLAN OF CARE: Patient  PLAN FOR NEXT SESSION: Continue wrist strengthening RUE, coordination tasks for LUE and BUE tasks      For all possible CPT codes, reference the Planned Interventions line above.     Check all conditions that are expected to impact treatment: {Conditions expected to impact treatment:Neurological condition and/or seizures and Current pregnancy or recent postpartum   If treatment provided at initial evaluation, no treatment charged due to lack of authorization.        Rocky Dutch, OT 11/18/2023, 4:33 PM

## 2023-11-19 ENCOUNTER — Encounter: Payer: Self-pay | Admitting: Certified Nurse Midwife

## 2023-11-19 ENCOUNTER — Telehealth: Payer: Self-pay | Admitting: Physical Therapy

## 2023-11-19 NOTE — Telephone Encounter (Signed)
 Dear [name], CNM,   Adley Castello is participating in physical therapy and would benefit from aquatic therapy.  The pool is heated (92 degrees F).  Do you feel this aquatic therapy would be appropriate/safe for the pt and her pregnancy?   Thanks, Damien Caulk, PT

## 2023-11-22 ENCOUNTER — Other Ambulatory Visit: Payer: Self-pay | Admitting: Certified Nurse Midwife

## 2023-11-22 ENCOUNTER — Encounter: Payer: Self-pay | Admitting: Certified Nurse Midwife

## 2023-11-22 DIAGNOSIS — Q2112 Patent foramen ovale: Secondary | ICD-10-CM

## 2023-11-22 DIAGNOSIS — Z8673 Personal history of transient ischemic attack (TIA), and cerebral infarction without residual deficits: Secondary | ICD-10-CM

## 2023-11-22 DIAGNOSIS — O0990 Supervision of high risk pregnancy, unspecified, unspecified trimester: Secondary | ICD-10-CM

## 2023-11-22 MED ORDER — ENOXAPARIN SODIUM 40 MG/0.4ML IJ SOSY: 40.0000 mg | PREFILLED_SYRINGE | INTRAMUSCULAR | 5 refills | Status: AC

## 2023-11-22 NOTE — Progress Notes (Signed)
 Lovenox 40mg  SQ daily ordered as recommended by Dr. William. Sofie notified. Referral placed to [facility] cardiology, patient is established with Dr. Ladona (last seen 5/25 for procedure.)

## 2023-11-23 ENCOUNTER — Ambulatory Visit

## 2023-11-23 ENCOUNTER — Ambulatory Visit: Admitting: Physical Therapy

## 2023-11-23 ENCOUNTER — Encounter: Payer: Self-pay | Admitting: Physical Therapy

## 2023-11-23 VITALS — BP 105/56 | HR 58

## 2023-11-23 DIAGNOSIS — R278 Other lack of coordination: Secondary | ICD-10-CM

## 2023-11-23 DIAGNOSIS — M6281 Muscle weakness (generalized): Secondary | ICD-10-CM

## 2023-11-23 DIAGNOSIS — I69354 Hemiplegia and hemiparesis following cerebral infarction affecting left non-dominant side: Secondary | ICD-10-CM

## 2023-11-23 DIAGNOSIS — R29898 Other symptoms and signs involving the musculoskeletal system: Secondary | ICD-10-CM

## 2023-11-23 DIAGNOSIS — R2689 Other abnormalities of gait and mobility: Secondary | ICD-10-CM

## 2023-11-23 DIAGNOSIS — M25531 Pain in right wrist: Secondary | ICD-10-CM

## 2023-11-23 DIAGNOSIS — M25562 Pain in left knee: Secondary | ICD-10-CM

## 2023-11-23 NOTE — Therapy (Signed)
 OUTPATIENT PHYSICAL THERAPY NEURO TREATMENT   Patient Name: Kristen Grant MRN: [id number] DOB:27-Jul-1995, 28 y.o., female Today's Date: 11/23/2023   PCP: No PCP REFERRING PROVIDER: [name], MD  END OF SESSION:  PT End of Session - 11/23/23 1232     Visit Number 4    Number of Visits 9   8 plus Eval   Date for Recertification  12/17/23   For scheduling delays   Authorization Type [place] MEDICAID [place]    Authorization Time Period 10/14-11/22/25    Authorization - Visit Number 4    Authorization - Number of Visits 8    PT Start Time 1231    PT Stop Time 1315    PT Time Calculation (min) 44 min    Equipment Utilized During Treatment Gait belt   gait belt positioned under armpits   Activity Tolerance Patient tolerated treatment well    Behavior During Therapy WFL for tasks assessed/performed          Past Medical History:  Diagnosis Date   Anxiety    Asthma    no treatments required at this time   Chlamydia 2023   Depression    Gonorrhea 2024   Migraine headache    Missed abortion with fetal demise before 20 completed weeks of gestation 04/12/2023   Nicotine vapor product user 10/06/2019   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   Stroke (HCC)    hx 2 ischemic strokesin September 2022--residual weakness to L face, arm and leg   Past Surgical History:  Procedure Laterality Date   BUBBLE STUDY  10/17/2020   Procedure: BUBBLE STUDY;  Surgeon: Jeffrie Oneil BROCKS, MD;  Location: [facility];  Service: Cardiovascular;;   IR ANGIO INTRA EXTRACRAN SEL COM CAROTID INNOMINATE UNI L MOD SED  10/11/2020   IR ANGIO VERTEBRAL SEL VERTEBRAL UNI L MOD SED  10/11/2020   IR CT HEAD LTD  10/11/2020   IR CT HEAD LTD  10/12/2020   IR PERCUTANEOUS ART THROMBECTOMY/INFUSION INTRACRANIAL INC DIAG ANGIO  10/11/2020   IR PERCUTANEOUS ART THROMBECTOMY/INFUSION INTRACRANIAL INC DIAG ANGIO  10/12/2020   NO PAST SURGERIES     PATENT FORAMEN OVALE(PFO) CLOSURE N/A 12/10/2020    Procedure: PATENT FORAMEN OVALE (PFO) CLOSURE;  Surgeon: Ladona Heinz, MD;  Location: [facility];  Service: Cardiovascular;  Laterality: N/A;   PATENT FORAMEN OVALE(PFO) CLOSURE N/A 06/16/2023   Procedure: PATENT FORAMEN OVALE(PFO) CLOSURE;  Surgeon: Ladona Heinz, MD;  Location: [facility];  Service: Cardiovascular;  Laterality: N/A;   RADIOLOGY WITH ANESTHESIA N/A 10/12/2020   Procedure: RADIOLOGY WITH ANESTHESIA;  Surgeon: Radiologist, Medication, MD;  Location: [facility] OR;  Service: Radiology;  Laterality: N/A;   RADIOLOGY WITH ANESTHESIA N/A 10/11/2020   Procedure: IR WITH ANESTHESIA;  Surgeon: Radiologist, Medication, MD;  Location: [facility] OR;  Service: Radiology;  Laterality: N/A;   TEE WITHOUT CARDIOVERSION N/A 10/17/2020   Procedure: TRANSESOPHAGEAL ECHOCARDIOGRAM (TEE);  Surgeon: Jeffrie Oneil BROCKS, MD;  Location: [facility] ENDOSCOPY;  Service: Cardiovascular;  Laterality: N/A;   TRANSESOPHAGEAL ECHOCARDIOGRAM (CATH LAB) N/A 05/05/2023   Procedure: TRANSESOPHAGEAL ECHOCARDIOGRAM;  Surgeon: Mona Vinie BROCKS, MD;  Location: [facility];  Service: Cardiovascular;  Laterality: N/A;   Patient Active Problem List   Diagnosis Date Noted   Supervision of high risk pregnancy, antepartum 03/22/2023   Breast mass 09/09/2022   Depression/anxiety 04/22/2021   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   Cannabis use disorder, severe, dependence (HCC) daily 03/24/2021  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   History of stroke x2 10/2020 12/16/2020   Depression, major, single episode, moderate (HCC) 12/16/2020   PFO (patent foramen ovale) 12/09/2020   Anemia    Hemiparesis affecting left side as late effect of stroke (HCC)    Leukocytosis    Dyslipidemia    Migraines  05/09/2019   LGSIL on Pap smear of cervix 08/18/2018   Generalized anxiety disorder 07/26/2017   ADD (attention deficit disorder) 07/07/2017   Maternal varicella, non-immune 08/01/2015    ONSET  DATE: Stroke Sept 2022; L knee pain started last May 2024  REFERRING DIAG: P30.645 (ICD-10-CM) - Hemiparesis affecting left side as late effect of stroke (HCC)  THERAPY DIAG:  Hemiparesis affecting left side as late effect of stroke (HCC)  Muscle weakness (generalized)  Other lack of coordination  Other abnormalities of gait and mobility  Left knee pain, unspecified chronicity  Rationale for Evaluation and Treatment: Rehabilitation  SUBJECTIVE:                                                                                                                                                                                             SUBJECTIVE STATEMENT: Pt reports forgetting L knee sleeve today so L knee is bothering her a little.  [place] (used manual w/c--got pushed in w/c--wore knee brace on B knees).  No HA or dizziness reported.  No falls reported.  Pt reports plan to start blood thinner (is going to do self injections)--just found out today. Pt accompanied by: self  PERTINENT HISTORY:  Per [name], MD note 11/05/23: Hx R MCA infarct in 2022, patient completed both inpatient and outpatient rehabilitation at that time had very good outcome returning to work.  Despite that she did have some persistent left hand incoordination and mild left lower extremity weakness.   No longer working at [employer], she tried working at another job but this had longer hours.  She then became pregnant and now no longer works.  She does walk about 7000 steps a day but does no other exercise   The patient has had no falls or injuries.  Her left wrist feels okay her right wrist has dorsal pain that she notes mainly when she is driving.  She has no numbness or tingling in the fingers at night or while she is driving.  She has noted no swelling in the wrist joint.  No elbow pain. Left knee pain is relieved by an elastic knee sleeve.  She states that it is mainly  in the front of the  knee rather than behind the knee.  She has not noted swelling of her knee.  No numbness tingling shooting down the left lower extremity from her back.  PMH includes asthma, migraine HA, R MCA stroke (h/o 2 ischemic strokes in Sept 2022--residual weakness to L face, arm, and leg), PFO closure.  Not currently on anti-coagulation.  PAIN:  Are you having pain? Yes: NPRS scale: 0/10 at rest; 2/10 with activity Pain location: L knee Pain description: fatigue Aggravating factors: working/activity Relieving factors: elevation  PRECAUTIONS: Other: Pregnancy (No specific precautions due to pregnancy per CNM 11/11/23); Per CNM 11/19/23: Recommend against hot tubs or temps over 100F.  Pt reporting starting anti-coagulation 11/23/23.  RED FLAGS: None   WEIGHT BEARING RESTRICTIONS: No  FALLS: Has patient fallen in last 6 months? No  LIVING ENVIRONMENT: Lives with: lives with their family (pt's mother), lives with their partner, and lives with their son (12 y.o.) Lives in: House/apartment Stairs: Yes: External: 3 steps; none Has following equipment at home: shower chair  PLOF: Independent and Needs assistance with ADLs; boyfriend helps tie shoes or assists with fine motor activities with L hand; (+) driving; currently not working  PATIENT GOALS: To not have to use L knee brace; improve L knee pain.  Improve balance (in/out of shower) and L LE strength.  OBJECTIVE:  Note: Objective measures were completed at Evaluation unless otherwise noted.  DIAGNOSTIC FINDINGS:  CT Head 08/29/23: 1. No evidence of acute intracranial abnormality. 2. Chronic right MCA infarct. 3. Right sphenoid sinus fluid, correlate for acute sinusitis.  COGNITION: Overall cognitive status: Within functional limits for tasks assessed   SENSATION: Light touch: WFL B LE's  COORDINATION: L LE fatigues with decreased quality with rapid alternating toe taps (in sitting)  EDEMA:  WNL B LE's  MUSCLE  TONE: Catch noted with quick stretch to L ankle into DF (normal R)  POSTURE: No Significant postural limitations  LOWER EXTREMITY ROM:     Active  Right Eval Left Eval  Hip flexion WNL WNL  Hip extension    Hip abduction    Hip adduction    Hip internal rotation    Hip external rotation    Knee flexion WNL WNL  Knee extension WNL WNL  Ankle dorsiflexion WNL WNL  Ankle plantarflexion WNL WNL  Ankle inversion    Ankle eversion     (Blank rows = not tested)  LOWER EXTREMITY MMT:    MMT Right Eval Left Eval  Hip flexion 4+/5 4+/5  Hip extension    Hip abduction    Hip adduction    Hip internal rotation    Hip external rotation    Knee flexion 4+/5 4+/5  Knee extension 4+/5 4+/5  Ankle dorsiflexion 4+/5 4+/5  Ankle plantarflexion    Ankle inversion    Ankle eversion    (Blank rows = not tested)  BED MOBILITY:  Reports being independent but sometimes takes extra time if gets dizzy (dizziness goes away pretty quick)  TRANSFERS: Sit to stand: Complete Independence  Assistive device utilized: None     Stand to sit: Complete Independence  Assistive device utilized: None      STAIRS: Not tested GAIT: Findings: Gait Characteristics: step through pattern and decreased ankle dorsiflexion- Left, Distance walked: clinic distances, Assistive device utilized:None, Level of assistance: Complete Independence, and Comments: pt reports L LE feeling weak during ambulation  FUNCTIONAL TESTS:  5 times sit to stand: 19.37 seconds; no UE support FGA: 21/30 (11/16/23)  FUNCTIONAL GAIT  ASSESSMENT  Date: 11/16/23 Score  GAIT LEVEL SURFACE Instructions: Walk at your normal speed from here to the next mark (6 m) [20 ft]. (1) Moderate impairment-Walks 6 m (20 ft), slow speed, abnormal gait pattern, evidence for imbalance, or deviates 25.4 - 38.1 cm (10 -15 in) outside of the 30.48-cm (12-in) walkway width. Requires more than 7 seconds to ambulate 6 m (20 ft). 7.97 seconds  2.   CHANGE IN  GAIT SPEED Instructions: Begin walking at your normal pace (for 1.5 m [5 ft]). When I tell you "go," walk as fast as you can (for 1.5 m [5 ft]). When I tell you "slow," walk as slowly as you can (for 1.5 m [5 ft]. (3) Normal - Able to smoothly change walking speed without loss of balance or gait deviation. Shows a significant difference in walking speeds between normal, fast, and slow speeds. Deviates no more than 15.24 cm (6 in) outside of the 30.48-cm (12-in) walkway width.  3.    GAIT WITH HORIZONTAL HEAD TURNS Instructions: Walk from here to the next mark 6 m (20 ft) away. Begin walking at your normal pace. Keep walking straight; after 3 steps, turn your head to the right and keep walking straight while looking to the right. After 3 more steps, turn your head to the left and keep walking straight while looking left. Continue alternating looking right and left. (3) Normal - Performs head turns smoothly with no change in gait. Deviates no more than 15.24 cm (6 in) outside 30.48-cm (12-in) walkway width.  4.   GAIT WITH VERTICAL HEAD TURNS Instructions: Walk from here to the next mark (6 m [20 ft]). Begin walking at your normal pace. Keep walking straight; after 3 steps, tip your head up and keep walking straight while looking up. After 3 more steps, tip your head down, keep walking straight while looking down. Continue  alternating looking up and down every 3 steps until you have completed 2 repetitions in each direction. (2) Mild impairment - Performs task with slight change in gait velocity (eg, minor disruption to smooth gait path), deviates 15.24 -25.4 cm (6 -10 in) outside 30.48-cm (12-in) walkway width or uses assistive device.  5.  GAIT AND PIVOT TURN Instructions: Begin with walking at your normal pace. When I tell you, "turn and stop," turn as quickly as you can to face the opposite direction and stop. (3) Normal - Pivot turns safely within 3 seconds and stops quickly with no loss of balance  6.    STEP OVER OBSTACLE Instructions: Begin walking at your normal speed. When you come to the shoe box, step over it, not around it, and keep walking. (2) Mild impairment - Is able to step over one shoe box (11.43 cm [4.5 in] total height) without changing gait speed; no evidence of imbalance.  7.   GAIT WITH NARROW BASE OF SUPPORT Instructions: Walk on the floor with arms folded across the chest, feet aligned heel to toe in tandem for a distance of 3.6 m [12 ft]. The number of steps taken in a straight line are counted for a maximum of 10 steps. (2) Mild impairment - Ambulates 7-9 steps  8.   GAIT WITH EYES CLOSED Instructions: Walk at your normal speed from here to the next mark (6 m [20 ft]) with your eyes closed. (1) Moderate impairment - Walks 6 m (20 ft), slow speed, abnormal gait pattern, evidence for imbalance, deviates 25.4 -38.1 cm (10 -15 in) outside 30.48-cm (12-in)  walkway width. Requires more than 9 seconds to ambulate 6 m (20 ft).12.94 seconds  9.   AMBULATING BACKWARDS Instructions: Walk backwards until I tell you to stop (2) Mild impairment - Walks 6 m (20 ft), uses assistive device, slower speed, mild gait deviations, deviates 15.24 -25.4 cm (6 -10 in) outside 30.48-cm (12-in) walkway width 21.69 seconds  10. STEPS Instructions: Walk up these stairs as you would at home (ie, using the rail if necessary). At the top turn around and walk down. (2) Mild impairment-Alternating feet, must use rail.  Total 21/30   Interpretation of scores: Non-Specific Older Adults Cutoff Score: <=22/30 = risk of falls Parkinson's Disease Cutoff score <15/30= fall risk (Hoehn & Yahr 1-4)  Minimally Clinically Important Difference (MCID)  Stroke (acute, subacute, and chronic) = MDC: 4.2 points Vestibular (acute) = MDC: 6 points Community Dwelling Older Adults =  MCID: 4 points Parkinson's Disease  =  MDC: 4.3 points  (Academy of Neurologic Physical Therapy (nd). Functional Gait Assessment. Retrieved from  https://www.neuropt.org/docs/default-source/cpgs/core-outcome-measures/function-gait-assessment-pocket-guide-proof9-(2).pdf?sfvrsn=b62f35043_0.)  PATIENT SURVEYS:  LEFS: 35/80; 43.75% (11/16/23)  Extreme difficulty/unable (0), Quite a bit of difficulty (1), Moderate difficulty (2), Little difficulty (3), No difficulty (4) Survey date:  11/16/23  Any of your usual work, housework or school activities 1  2. Usual hobbies, recreational or sporting activities 2  3. Getting into/out of the bath 1  4. Walking between rooms 4  5. Putting on socks/shoes 1  6. Squatting  2  7. Lifting an object, like a bag of groceries from the floor 4  8. Performing light activities around your home 3  9. Performing heavy activities around your home 1  10. Getting into/out of a car 4  11. Walking 2 blocks 3  12. Walking 1 mile 1  13. Going up/down 10 stairs (1 flight) 3  14. Standing for 1 hour 0  15.  sitting for 1 hour 3  16. Running on even ground 0  17. Running on uneven ground 0  18. Making sharp turns while running fast 0  19. Hopping  0  20. Rolling over in bed 2 (d/t pregnancy)  Score total:  35/80      Vitals:  Beginning of session: Vitals:   11/23/23 1252  BP: (!) 105/56  Pulse: (!) 58   End of session: BP R UE sitting 104/57 with HR 61 bpm                                                                                                                              TREATMENT DATE: 11/23/23  Self Care: BP and HR taken in sitting at rest beginning and end of session (see above for details) Discussed aquatic therapy; pt reporting being interested and wanted to schedule appts; reviewed therapist communication with CNM regarding aquatic therapy (is safe for pt/pregnancy; precautions); plan to schedule appts; pt issued aquatic therapy handouts (and handouts reviewed) regarding what to expect, precautions, and directions to  facility  Therapeutic Exercise: SciFit multi-peaks up to level 5 for 8  minutes using BUE/BLEs for global strengthening, reciprocal mobility, and endurance. RPE of 4/10 following activity; HR 62 bpm post activity with SpO2 sats 98% room air  Therapeutic Activity: Standing // bars: on Airex; no UE support; toe taps forward (alternating) to gum drop; CGA; 2x10 reps B LE's Standing // bars: 2x1 minute eyes closed; no UE support; feet together; on Airex; CGA for safety; use of ankle strategies  PATIENT EDUCATION: Education details: Continue walking program; Aquatic therapy discussed (including communication with CNM) and appt set up.  Discussed foot-up brace: pt reports she might wear a type of shoe that it would work with (is thinking more about it) Person educated: Patient Education method: Explanation Education comprehension: verbalized understanding  HOME EXERCISE PROGRAM: 11/18/23: You Can Walk For A Certain Length Of Time Each Day                          Walk 10 minutes 1-2 times per day.             Increase 3-4  minutes every 7-14 days              Work up to 20-30 minutes (1-2 times per day).               Example:                         Day 1-2           4-5 minutes     3 times per day                         Day 7-8           10-12 minutes 2-3 times per day                         Day 13-14       20-22 minutes 1-2 times per day  11/16/23 Access Code: [email or web address] URL: [email or web address] Date: 11/16/2023 Prepared by: Damien Caulk  Exercises - Standing March with Counter Support  - 1 x daily - 5 x weekly - 1-2 sets - 10 reps - Heel Raises with Counter Support  - 1 x daily - 5 x weekly - 1-2 sets - 10 reps  GOALS: Goals reviewed with patient? Yes  SHORT TERM GOALS: Equals long term goals based on length of POC  LONG TERM GOALS: Target date: 12/10/2023  Pt will be independent with final HEP in order to improve strength and balance in order to decrease fall risk and improve function at home for ADL's.  Baseline: To be issued  (not issued d/t time) Goal status: INITIAL  2.  Pt will decrease 5 Time Sit to Stand by at least 3 seconds in order to demonstrate clinically significant improvement in LE strength.  Baseline: 19.37 seconds (Eval) Goal status: INITIAL  3.  Assess FGA and update FGA goal based on results. Baseline: 21/30 (11/16/23) Goal status: INITIAL  4.  Pt will increase LEFS by at least 9 points in order to demonstrate significant improvement in lower extremity function.   Baseline: 35/80 (11/16/23) Goal status: INITIAL  5. Patient will be able to perform household work/chores without increase in L knee symptoms.  Baseline: L knee pain increases with activities.  Goal status: INITIAL  ASSESSMENT:  CLINICAL IMPRESSION: Patient was seen today for physical therapy treatment to address aquatic therapy, strength, and balance.  Per communication with [name] CNM, aquatic therapy is safe in pregnancy (precautions noted above; discussed with aquatic physical therapist; appt set up).  Focused session on LE strengthening, LE coordination, and standing balance on compliant surface.  Pt requiring pacing d/t generalized fatigue.  Patient continues to be limited by L>R LE weakness.  They would continue to benefit from skilled PT to address impairments as noted and progress towards long term goals.  OBJECTIVE IMPAIRMENTS: Abnormal gait, decreased activity tolerance, decreased balance, decreased coordination, decreased endurance, decreased mobility, decreased strength, and pain.   ACTIVITY LIMITATIONS: carrying, lifting, bending, standing, squatting, stairs, locomotion level, and caring for others  PARTICIPATION LIMITATIONS: cleaning, shopping, community activity, occupation, yard work, and church  PERSONAL FACTORS: Past/current experiences, Time since onset of injury/illness/exacerbation, and 3+ comorbidities: R MCA stroke, migraine HA, L knee pain, R wrist pain are also affecting patient's functional  outcome.   REHAB POTENTIAL: Good  CLINICAL DECISION MAKING: Evolving/moderate complexity  EVALUATION COMPLEXITY: Moderate  PLAN:  PT FREQUENCY: 2x/week  PT DURATION: 4 weeks  PLANNED INTERVENTIONS: [id number]- PT Re-evaluation, 97750- Physical Performance Testing, 97110-Therapeutic exercises, 97530- Therapeutic activity, [id number]- Neuromuscular re-education, 97535- Self Care, [id number]- Manual therapy, [id number]- Gait training, [id number]- Orthotic Initial, [id number]- Orthotic/Prosthetic subsequent, [phone]- Canalith repositioning, [phone]- Aquatic Therapy, Patient/Family education, Balance training, Stair training, Taping, Vestibular training, DME instructions, Cryotherapy, and Moist heat  PLAN FOR NEXT SESSION: Sci-Fit; L LE strengthening; pain control; balance; progress HEP; walking program; follow-up on aquatic appts; Foot-up brace?   Damien Caulk, PT 11/23/2023, 9:11 PM

## 2023-11-23 NOTE — Therapy (Signed)
 OUTPATIENT OCCUPATIONAL THERAPY NEURO TREATMENT  Patient Name: Kristen Grant MRN: [id number] DOB:1995/04/29, 28 y.o., female Today's Date: 11/23/2023  PCP: none  REFERRING PROVIDER: [name], MD  END OF SESSION:  OT End of Session - 11/23/23 1317     Visit Number 4    Number of Visits 13    Date for Recertification  01/10/24    Authorization Type [employer]    OT Start Time 1318    OT Stop Time 1359    OT Time Calculation (min) 41 min    Equipment Utilized During Treatment red theraputty, elastic shoelaces for demo, rocker knife for demo    Activity Tolerance Patient tolerated treatment well    Behavior During Therapy WFL for tasks assessed/performed            Past Medical History:  Diagnosis Date   Anxiety    Asthma    no treatments required at this time   Chlamydia 2023   Depression    Gonorrhea 2024   Migraine headache    Missed abortion with fetal demise before 20 completed weeks of gestation 04/12/2023   Nicotine vapor product user 10/06/2019   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   Stroke (HCC)    hx 2 ischemic strokesin September 2022--residual weakness to L face, arm and leg   Past Surgical History:  Procedure Laterality Date   BUBBLE STUDY  10/17/2020   Procedure: BUBBLE STUDY;  Surgeon: Jeffrie Oneil BROCKS, MD;  Location: [facility];  Service: Cardiovascular;;   IR ANGIO INTRA EXTRACRAN SEL COM CAROTID INNOMINATE UNI L MOD SED  10/11/2020   IR ANGIO VERTEBRAL SEL VERTEBRAL UNI L MOD SED  10/11/2020   IR CT HEAD LTD  10/11/2020   IR CT HEAD LTD  10/12/2020   IR PERCUTANEOUS ART THROMBECTOMY/INFUSION INTRACRANIAL INC DIAG ANGIO  10/11/2020   IR PERCUTANEOUS ART THROMBECTOMY/INFUSION INTRACRANIAL INC DIAG ANGIO  10/12/2020   NO PAST SURGERIES     PATENT FORAMEN OVALE(PFO) CLOSURE N/A 12/10/2020   Procedure: PATENT FORAMEN OVALE (PFO) CLOSURE;  Surgeon: Ladona Heinz, MD;  [facility]: [facility];  Service: Cardiovascular;  Laterality:  N/A;   PATENT FORAMEN OVALE(PFO) CLOSURE N/A 06/16/2023   Procedure: PATENT FORAMEN OVALE(PFO) CLOSURE;  Surgeon: Ladona Heinz, MD;  [facility]: [facility];  Service: Cardiovascular;  Laterality: N/A;   RADIOLOGY WITH ANESTHESIA N/A 10/12/2020   Procedure: RADIOLOGY WITH ANESTHESIA;  Surgeon: Radiologist, Medication, MD;  Location: [facility] OR;  Service: Radiology;  Laterality: N/A;   RADIOLOGY WITH ANESTHESIA N/A 10/11/2020   Procedure: IR WITH ANESTHESIA;  Surgeon: Radiologist, Medication, MD;  Location: [facility] OR;  Service: Radiology;  Laterality: N/A;   TEE WITHOUT CARDIOVERSION N/A 10/17/2020   Procedure: TRANSESOPHAGEAL ECHOCARDIOGRAM (TEE);  Surgeon: Jeffrie Oneil BROCKS, MD;  Location: [facility] ENDOSCOPY;  Service: Cardiovascular;  Laterality: N/A;   TRANSESOPHAGEAL ECHOCARDIOGRAM (CATH LAB) N/A 05/05/2023   Procedure: TRANSESOPHAGEAL ECHOCARDIOGRAM;  Surgeon: Mona Vinie BROCKS, MD;  [facility]: [facility];  Service: Cardiovascular;  Laterality: N/A;   Patient Active Problem List   Diagnosis Date Noted   Supervision of high risk pregnancy, antepartum 03/22/2023   Breast mass 09/09/2022   Depression/anxiety 04/22/2021   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   Cannabis use disorder, severe, dependence (HCC) daily 03/24/2021   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   History of stroke x2 10/2020 12/16/2020   Depression, major, single episode, moderate (HCC) 12/16/2020   PFO (patent foramen ovale)  12/09/2020   Anemia    Hemiparesis affecting left side as late effect of stroke (HCC)    Leukocytosis    Dyslipidemia    Migraines  05/09/2019   LGSIL on Pap smear of cervix 08/18/2018   Generalized anxiety disorder 07/26/2017   ADD (attention deficit disorder) 07/07/2017   Maternal varicella, non-immune 08/01/2015    ONSET DATE: 11/05/2023  REFERRING DIAG: History of right MCA distribution infarct with residual mild left hemiparesis and motor control  issues Note:  RIght wrist pain no trauma as well as vestibular eval  THERAPY DIAG:  Hemiparesis affecting left side as late effect of stroke (HCC)  Pain in right wrist  Other symptoms and signs involving the musculoskeletal system  Other lack of coordination  Muscle weakness (generalized)  Rationale for Evaluation and Treatment: Rehabilitation  SUBJECTIVE:   SUBJECTIVE STATEMENT: Pt seen after PT appointment, reported no pain in wrist at this time. Reports completing isometrics but not putty yet at this time.  Pt accompanied by: self  PERTINENT HISTORY: per MD note on 11/05/23: History of right MCA distribution infarct (2022) with residual mild left hemiparesis and motor control issues. Right wrist pain no evidence of trauma it is mainly in the dorsal area. She has a remote history of wrist fracture in 2008. She could certainly be developing some posttraumatic arthritis at this point though does not look like she has any joint tenderness or inflammation. Will send her to OT. Have advised wearing a Velcro wrist splint when driving  PRECAUTIONS: Other: pregnant - caution w/ modality use  WEIGHT BEARING RESTRICTIONS: No  PAIN:  Are you having pain? Rt wrist 0/10, intermittent w/ certain tasks aggravating it.   FALLS: Has patient fallen in last 6 months? No  LIVING ENVIRONMENT: Lives with: lives with their family (mom, boyfriend, 75 y.o. son)  Lives in: 1 level home Has following equipment at home: shower chair but doesn't use  PLOF: Independent with basic ADLs and stopped working since pregnant   PATIENT GOALS: decrease Rt wrist pain, use Lt hand more, braid hair and tie shoes better  OBJECTIVE:  Note: Objective measures were completed at Evaluation unless otherwise noted.  HAND DOMINANCE: Right  ADLs:  Eating: assist w/ cutting food Grooming: assist to wash hair, mod I for brushing teeth and brushing hair but can't braid hair UB Dressing: mod I  LB Dressing: mod I -  but usually gets help to tied shoes Toileting: independent Bathing: distant supervision Tub Shower transfers: distant supervision because pt gets dizzy. Assist to get out of tub if pt bathes vs shower. Equipment: has shower chair but doesn't use b/c it takes up too much room  IADLs: Shopping: pt orders online and then picks up  Light housekeeping: pt does laundry (boyfriend and mom does)  Meal Prep: boyfriend does mostly, but pt helps w/ side dishes - assist for pots Community mobility: drives I[employer] Medication management: [employer] Financial management: boyfriend does Handwriting: no changes  MOBILITY STATUS: Independent    FUNCTIONAL OUTCOME MEASURES: Upper Extremity Functional Scale (UEFS): 48.75% functioning (51.25% deficit)   UPPER EXTREMITY ROM:  BUE AROM WFL's but difficulty w/ coordination Lt hand   UPPER EXTREMITY MMT:     MMT Right eval Left eval  Shoulder flexion 5 3+  Shoulder abduction 5 4  Shoulder adduction    Shoulder extension    Shoulder internal rotation 5 4  Shoulder external rotation 5 4  Middle trapezius    Lower trapezius    Elbow  flexion    Elbow extension    Wrist flexion    Wrist extension    Wrist ulnar deviation    Wrist radial deviation    Wrist pronation    Wrist supination    (Blank rows = not tested)  HAND FUNCTION: Grip strength: Right: 62.1 lbs; Left: 32.6 lbs  COORDINATION: 9 Hole Peg test: Right: 21.52 sec; Left: 37.83 sec Box and Blocks:  Left 35 blocks w/ fatigue  SENSATION: Light touch: WFL Hot/Cold: Impaired  and diminished  EDEMA: none   COGNITION: Overall cognitive status: Within functional limits for tasks assessed  VISION: Subjective report: no changes but does have dizziness Baseline vision: No visual deficits Visual history: none  VISION ASSESSMENT: Not tested   PERCEPTION: Not tested  PRAXIS: Not tested  OBSERVATIONS: new Rt wrist pain which pt reports is due to overuse from not using Lt hand as  much, pain mostly with driving, trying to hold pots/pans, and trying to hold/use cellphone. Decreased coordination Lt hand since stroke 2022 and some learned non use. Lt small finger abducts out and pt doesn't use as much however does demo in hand manipulation for checkers                                                                                                                             TREATMENT DATE: 11/23/23  Pt educated in energy conservation techniques, see Pt instructions for detailed handout. Reviewed joint protection strategy of using proximal joints during functional tasks as much as possible rather than distal joints. Assessed pt engaging in simulated diaper change with doll and diaper, pt participated well with bilateral use and attempted to utilize L hand more than R hand, stated I've been trying to do that more, still feels a little weird. Pt reported looking into [facility] orthotic that she had tried on but has not purchased yet.   Educated pt in AE for reducing strain on R wrist for ADLs including long handled comb and rocker knife and knife with built up handle. Pt tried both knives with green putty for simulated cutting of food, reported preferring rocker knife. Educated pt in where to obtain. Also demonstrated braiding hair with use of proximal joints more than distal.  Pt engaged in shoe tying, good bilateral coordination and use of proximals. Pt also educated in elastic shoelaces and where to obtain them to provide options.   Added onto theraputty HEP, removing items from pt's red putty, instructed to utilize L hand for the finer coordination tasks of pinching and pulling items out of putty but could use R hand for pulling apart putty with use of proximals as much as possible.   Edcauted pt in wrist stretches to reduce pain during functional FM tasks, pt educated to complete prn and try to complete before tasks that cause strain on R wrist. Pt reported wishing to work on  improving endurance in LUE.    PATIENT EDUCATION: Education details: SEE  ABOVE Person educated: Patient Education method: Explanation Education comprehension: verbalized understanding  HOME EXERCISE PROGRAM: 11/16/23: Coordination and Wrist isometrics (ACCESS CODE [id number])  11/18/23: Putty for L hand with red putty [facility])   11/23/23: Wrist stretches (ACCESS CODE [id number])   GOALS: Goals reviewed with patient? Yes  SHORT TERM GOALS: Target date: 12/11/23  Independent with HEP for Lt hand coordination and grip strength Baseline: not yet addressed/issued Goal status: IN PROGRESS  2.  Independent with isometric wrist strengthening HEP for Rt wrist  Baseline: not yet addressed/issued Goal status: IN PROGRESS  3.  Pt to verbalize understanding with task modifications and potential A/E to decrease pain Rt wrist and increase ease/independence and safety (possible wrist brace prn, modifications to steering wheel, cell phone attachment, cutting food, holding pot/pan, alternative shoelace options prn) Baseline: not yet addressed/issued Goal status: IN PROGRESS  4.  Pt to be independent with activities for Lt hand (m-CIMT activities) to promote Lt hand assist for tasks and to decrease learned non use  Baseline: not yet addressed/issued Goal status: IN PROGRESS  5.  Pt to verbalize understanding with safety considerations d/t sensory loss Lt hand Baseline: not yet addressed/issued Goal status: IN PROGRESS   LONG TERM GOALS: Target date: 01/10/24  Pt to report greater ease with tying shoes, holding cell phone, and using steering wheel w/ pain Rt wrist less than or equal to 4/10  Baseline: up to 8/10 Goal status: IN PROGRESS  2.  Pt to simulate changing diaper in prep for new infant care at mod I level safely Baseline: not yet addressed/issued Goal status: IN PROGRESS  3.  Pt to braid hair w/ task modifications and A/E prn  Baseline: dependent Goal status: IN  PROGRESS  4.  Pt to improve coordination Lt hand as evidenced by reducing speed on 9 hole peg test to 33 sec or less Baseline: 37 sec Goal status: INITIAL  5.  Pt to improve grip strength Lt hand to 38 lbs or greater to assist more in functional tasks Baseline: 32 lbs Goal status: INITIAL   ASSESSMENT:  CLINICAL IMPRESSION: Patient is a 28 y.o. female who was seen today for occupational therapy tx for Lt hemiparesis (from stroke in 2022) w/ some learned non use and new Rt wrist pain possibly from overuse and OA. Hx includes CVA. Pt is also currently pregnant. Pt expressed some interest in AE for reducing joint strain and reported improved comfort with orthotic fitting, agreed to search and obtain one. Pt tolerated c-imt well, agreed to attempt at home. Some concerns with carryover, however, as pt reported not completing issued exercises and activities since last tx stating I just forgot. Pt at this time would benefit from continued OT services to improve ability to complete ADL/IADL tasks with improved strength and coordination and reduced pain.   PERFORMANCE DEFICITS: in functional skills including ADLs, IADLs, coordination, dexterity, sensation, strength, pain, Fine motor control, Gross motor control, mobility, balance, body mechanics, decreased knowledge of use of DME, and UE functional use.   IMPAIRMENTS: are limiting patient from ADLs, IADLs, leisure, and childcare needs.   CO-MORBIDITIES: may have co-morbidities  that affects occupational performance. Patient will benefit from skilled OT to address above impairments and improve overall function.  MODIFICATION OR ASSISTANCE TO COMPLETE EVALUATION: No modification of tasks or assist necessary to complete an evaluation.  OT OCCUPATIONAL PROFILE AND HISTORY: Detailed assessment: Review of records and additional review of physical, cognitive, psychosocial history related to current functional performance.  CLINICAL DECISION MAKING:  Moderate - several treatment options, min-mod task modification necessary  REHAB POTENTIAL: Good  EVALUATION COMPLEXITY: Moderate    PLAN:  OT FREQUENCY: 2x/week  OT DURATION: 6 weeks (plus eval)   PLANNED INTERVENTIONS: 97535 self care/ADL training, 02889 therapeutic exercise, 97530 therapeutic activity, 97112 neuromuscular re-education, 97140 manual therapy, 97018 paraffin, 02960 fluidotherapy, 97010 moist heat, 97010 cryotherapy, 97034 contrast bath, 97760 Orthotic Initial, 97763 Orthotic/Prosthetic subsequent, passive range of motion, patient/family education, and DME and/or AE instructions  RECOMMENDED OTHER SERVICES: none at this time  CONSULTED AND AGREED WITH PLAN OF CARE: Patient  PLAN FOR NEXT SESSION: Continue wrist strengthening RUE, coordination tasks for LUE and BUE tasks   Endurance for LUE    For all possible CPT codes, reference the Planned Interventions line above.     Check all conditions that are expected to impact treatment: {Conditions expected to impact treatment:Neurological condition and/or seizures and Current pregnancy or recent postpartum   If treatment provided at initial evaluation, no treatment charged due to lack of authorization.        Rocky Dutch, OT 11/23/2023, 2:00 PM

## 2023-11-23 NOTE — Patient Instructions (Addendum)

## 2023-11-24 ENCOUNTER — Other Ambulatory Visit: Payer: Self-pay | Admitting: Certified Nurse Midwife

## 2023-11-24 NOTE — Progress Notes (Unsigned)
 Counselor/Therapist Progress Note  Patient ID: Kristen Grant, MRN: [id number],    Date: 11/24/2023  Time Spent: ## total minutes. I spent ## minutes on real-time audio and video with the patient on the date of service. I spent an additional ##  minutes on pre- and post-visit activities on the date of service including collateral, chart review, team discussion, and documentation.     Treatment Type: Individual Therapy  Reported Symptoms: {CHL AMB Reported [phone]}  Mental Status Exam:  Appearance:   {PSY:22683}     Behavior:  {PSY:21022743}  Motor:  {PSY:22302}  Speech/Language:   {PSY:22685}  Affect:  {PSY:22687}  Mood:  {PSY:31886}  Thought process:  {PSY:31888}  Thought content:    {PSY:[phone]}  Sensory/Perceptual disturbances:    {PSY:914-276-0441}  Orientation:  {PSY:30297}  Attention:  {PSY:22877}  Concentration:  {[phone]}  Memory:  {PSY:703-150-6809}  Fund of knowledge:   {[phone]}  Insight:    {[phone]}  Judgment:   {[phone]}  Impulse Control:  {[phone]}   Risk Assessment: Danger to Self:  {PSY:22692} Self-injurious Behavior: {PSY:22692} Danger to Others: {PSY:22692} Duty to Warn:{PSY:311194} Physical Aggression / Violence:{PSY:21197} Access to Firearms a concern: {PSY:21197} Gang Involvement:{PSY:21197}  Subjective: Patient reports    Interventions: {PSY:[id number]} LCSW established psychological safety.   LCSW met with patient to identify needs related to stressors and functioning, and assess and monitor for signs and symptoms of and , and assess safety. Checked in with patient regarding how they have been doing since the last follow-up session.   Met with patient to assess current needs related to stressors and daily functioning. Monitored for symptoms of and assessed for safety. Conducted a follow-up check-in on the patient's well-being since the last session.   LCSW assisted patient in processing their  thoughts and emotions regarding ***   LCSW provided psychoeducation and therapeutic intervention regarding coping with ***   Provided support through active listening, validation of feelings, and highlighted patient's strengths.  Engaged patient in processing current psychosocial stressors  LCSW intervened with positive regard and optimism to validate client's emotions, and supported client in exploring ways to increase her intentional use of self care  Used guided discovery to explore patient's perception of relationship challenges asking patient to identify supportive evidence for thoughts and against thoughts, encouraging a broader perspective.  Reviewed previous session regarding   LCSW assisted, actively listened, taught, shared, role/played, provided  LCSW reviewed behavior modification, distress tolerance and effective communication skills with patient.   Provided supportive space encouraging emotional release and processing of current psychosocial stressors  Diagnosis:No diagnosis found.  Plan: Patient's goal of treatment is to get her thoughts better- more positive less negative, more positive feelings and thoughts.    Patient will reduce anxious physiological arousal during stressful events. Measurable Objective: Patient will report a decrease in subjective anxiety ratings (e.g., from 8/10 to 5/10)  Technique/Method: The patient will practice a calming, sensory-based exercise to bring awareness to the present moment. Patient will increase awareness of their present-moment experience. Measurable Objective: The patient will track their completion of a daily mindful practice and rate their experience in a journal at least 4 times per week. Technique/Method: Practice bringing attention to the breath or other bodily sensations to anchor oneself in the present. Patient will decrease the frequency of engaging with anxious negative thoughts. Measurable Objective: Patient will log the  number of times per day they observe and detach from a negative thought using a specific technique, aiming for a 25% reduction in engagement with the thought  over a 4-week period. Technique/Method: The patient will practice viewing thoughts as separate from themselves, like clouds passing in the sky. Patient will reduce the impact of cognitive distortions on their mood and behavior. Measurable Objective: The patient will identify and record two specific negative thought patterns per week and generate a more balanced alternative thought. Technique/Method: The patient will analyze unhelpful thought patterns and reframe them more realistically. Patient will increase engagement in activities that align with their core values. Measurable Objective: The patient will schedule and participate in at least one values-aligned activity per week, and track their mood before and after the activity. Technique/Method: The patient will identify what is most important to them and take committed action toward those areas of life, even if anxiety is present. Patient will enhance feelings of safety and calm in their body. Measurable Objective: The patient will practice a positive visualization or grounding exercise at least 5 times per week, documenting the resulting sense of calm. Technique/Method: The patient will use imagery and physical sensation to reinforce a sense of inner security and safety. Patient will enhance their focus on positive aspects of their life. Measurable Objective: The patient will consistently complete a gratitude exercise (e.g., listing 3 positive things) at least 5 times per week, tracking their experience. Technique/Method: The patient will engage in a regular practice of acknowledging and appreciating positive experiences to shift perspective.  Future Appointments  Date Time Provider Department Center  11/25/2023  9:00 AM Ellender Palma, LCSW [facility] None  11/25/2023  2:45 PM Willadean Perkins, PT [facility]  11/26/2023  4:00 PM [facility] US  1 [facility] None  11/29/2023  9:35 AM Jayne Raisin L, CNM [facility] None  11/30/2023 11:00 AM Abelardo Burnard PARAS, OT [facility]  11/30/2023 11:45 AM Ulyses Daved NOVAK, PT [facility]  12/02/2023 11:00 AM Ulyses Daved NOVAK, PT [facility]  12/02/2023  1:15 PM Althia Longs, OT [facility]  12/03/2023  1:40 PM Tobb, Dub, DO [facility] None  12/07/2023 12:30 PM Willadean Perkins, PT [facility]  12/07/2023  1:15 PM Abelardo Burnard PARAS, OT [facility]  12/09/2023 12:30 PM Willadean Perkins, PT [facility]  12/09/2023  1:15 PM Althia Longs, OT [facility]  12/10/2023  8:15 AM Jayne Raisin CROME, CNM [facility] None  12/13/2023  9:45 AM [facility] CONSULT RM [facility] None  12/13/2023 10:00 AM [facility] US1 [facility]  12/17/2023  9:30 AM Kirsteins, Prentice BRAVO, MD [facility] CPR  02/14/2024  7:45 AM Whitfield Raisin, NP [facility] None    Palma Ellender, LCSW

## 2023-11-24 NOTE — Progress Notes (Unsigned)
 Return Prenatal Note   Subjective   28 y.o. G3P1011 at [redacted]w[redacted]d presents for this follow-up prenatal visit.  Patient reports she is tolerating Effexor  but not noticing improvement in her mood and continues to have significant anxiety, had fleeting thoughts of wishing she weren't here but denies any active SI, plan to harm self, denies access to firearms. She missed her appointment with LCSW at [facility] but does plan to establish therapy.  Has started Lovenox injections, some bruising at injection sites but tolerating overall well. Continuing PT, has started hydrotherapy and is enjoying this with some relief of discomfort. Patient reports: Movement: Present  Objective   Flow sheet Vitals: Pulse Rate: 67 BP: (!) 111/57 Fundal Height: 145 cm Total weight gain: -9 lb 7.5 oz (-4.295 kg)  General Appearance  No acute distress, well appearing, and well nourished Pulmonary   Normal work of breathing Neurologic   Alert and oriented to person, place, and time Psychiatric   Mood and affect within normal limits    11/29/2023    9:37 AM 11/09/2023    8:50 AM 11/05/2023    9:57 AM 03/17/2023   10:10 AM 09/09/2022   11:43 AM  Depression screen PHQ 2/9  Decreased Interest 3 3 1 3    Down, Depressed, Hopeless 3 1 1 2    PHQ - 2 Score 6 4 2 5    Altered sleeping 3 3  2 3   Tired, decreased energy 3 3  3 3   Change in appetite 3 2  1 2   Feeling bad or failure about yourself  1 1  0 1  Trouble concentrating 1 3  1 1   Moving slowly or fidgety/restless 0 1  0 2  Suicidal thoughts 1 0  0 1  PHQ-9 Score 18 17  12    Difficult doing work/chores Extremely dIfficult Extremely dIfficult  Somewhat difficult Extremely dIfficult      11/29/2023    9:38 AM 11/11/2023   10:40 AM 11/09/2023    8:50 AM 05/26/2021    2:03 PM  GAD 7 : Generalized Anxiety Score  Nervous, Anxious, on Edge 3  3 3   Control/stop worrying 3  3 3   Worry too much - different things 3  3 3   Trouble relaxing 3  3 3   Restless  1  0 2  Easily annoyed or irritable 3  3 3   Afraid - awful might happen 2  3 1   Total GAD 7 Score 18  18 18   Anxiety Difficulty Extremely difficult  Very difficult Somewhat difficult     Information is confidential and restricted. Go to Review Flowsheets to unlock data.   Depression & Anxiety screenings positive  Assessment/Plan   Plan  28 y.o. G3P1011 at [redacted]w[redacted]d presents for follow-up OB visit. Reviewed prenatal record including previous visit note.  Supervision of high risk pregnancy, antepartum Red flag symptoms reviewed. MFM appointment Friday 10/31. Continue Lovenox, consider applying ice to site prior to injection.  Major depressive disorder affecting pregnancy Increase Effexor  to 75mg  daily, hydroxyzine  25mg  po qid prn for insomnia & anxiety. Referrals to [facility] center for women's mood disorder placed.      Orders Placed This Encounter  Procedures   Ambulatory referral to Psychiatry    Referral Priority:   Routine    Referral Type:   Psychiatric    Referral Reason:   Specialty Services Required    Requested Specialty:   Psychiatry    Number of Visits Requested:  1   No follow-ups on file.   Future Appointments  Date Time Provider Department Center  12/02/2023 11:00 AM Ulyses Daved NOVAK, [place] [facility]  12/02/2023  1:15 PM Althia Longs, [place] [facility]  12/03/2023 10:00 AM [facility] PROVIDER 1 [facility]  12/03/2023 10:30 AM [facility] US3 [facility]  12/03/2023  1:40 PM Tobb, Kardie, DO [facility] None  12/07/2023 12:30 PM Willadean Perkins, PT [facility]  12/07/2023  1:15 PM Abelardo Burnard PARAS, OT [facility]  12/09/2023 12:30 PM Willadean Perkins, PT [facility]  12/09/2023  1:15 PM Althia Longs, OT [facility]  12/10/2023  8:15 AM Jayne Harlene CROME, CNM [facility] None  12/17/2023  9:30 AM Kirsteins, Prentice BRAVO, MD [facility] CPR  02/14/2024  7:45 AM Whitfield Harlene, NP [facility] None    For next visit:  continue with routine prenatal  care     Harlene CROME Jayne, CNM  10/29/251:07 PM

## 2023-11-25 ENCOUNTER — Ambulatory Visit: Admitting: Physical Therapy

## 2023-11-25 ENCOUNTER — Ambulatory Visit

## 2023-11-25 ENCOUNTER — Telehealth: Payer: Self-pay | Admitting: Licensed Clinical Social Worker

## 2023-11-25 ENCOUNTER — Encounter: Payer: Self-pay | Admitting: Physical Therapy

## 2023-11-25 ENCOUNTER — Ambulatory Visit: Admitting: Licensed Clinical Social Worker

## 2023-11-25 DIAGNOSIS — F411 Generalized anxiety disorder: Secondary | ICD-10-CM

## 2023-11-25 DIAGNOSIS — R2689 Other abnormalities of gait and mobility: Secondary | ICD-10-CM

## 2023-11-25 DIAGNOSIS — M6281 Muscle weakness (generalized): Secondary | ICD-10-CM

## 2023-11-25 DIAGNOSIS — R29898 Other symptoms and signs involving the musculoskeletal system: Secondary | ICD-10-CM

## 2023-11-25 DIAGNOSIS — M25562 Pain in left knee: Secondary | ICD-10-CM

## 2023-11-25 DIAGNOSIS — I69354 Hemiplegia and hemiparesis following cerebral infarction affecting left non-dominant side: Secondary | ICD-10-CM

## 2023-11-25 DIAGNOSIS — M25531 Pain in right wrist: Secondary | ICD-10-CM

## 2023-11-25 DIAGNOSIS — R278 Other lack of coordination: Secondary | ICD-10-CM

## 2023-11-25 NOTE — Telephone Encounter (Signed)
 LCSW attempted to call patient due to no show for virtual appointment. LCSW left vm and waited on virtual visit for 10 minutes.

## 2023-11-25 NOTE — Therapy (Signed)
 OUTPATIENT PHYSICAL THERAPY NEURO TREATMENT   Patient Name: Kristen Grant MRN: [id number] DOB:10-17-1995, 28 y.o., female Today's Date: 11/25/2023   PCP: No PCP REFERRING PROVIDER: [name], MD  END OF SESSION:  PT End of Session - 11/25/23 1104     Visit Number 5    Number of Visits 9   8 plus Eval   Date for Recertification  12/17/23   For scheduling delays   Authorization Type [place] MEDICAID [facility]    Authorization Time Period 10/14-11/22/25    Authorization - Visit Number 5    Authorization - Number of Visits 8    PT Start Time 1101    PT Stop Time 1148    PT Time Calculation (min) 47 min    Equipment Utilized During Treatment --   aquatic devices as needed for safety and challenge   Activity Tolerance Patient tolerated treatment well    Behavior During Therapy WFL for tasks assessed/performed          Past Medical History:  Diagnosis Date   Anxiety    Asthma    no treatments required at this time   Chlamydia 2023   Depression    Gonorrhea 2024   Migraine headache    Missed abortion with fetal demise before 20 completed weeks of gestation 04/12/2023   Nicotine vapor product user 10/06/2019   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   Stroke (HCC)    hx 2 ischemic strokesin September 2022--residual weakness to L face, arm and leg   Past Surgical History:  Procedure Laterality Date   BUBBLE STUDY  10/17/2020   Procedure: BUBBLE STUDY;  Surgeon: Jeffrie Oneil BROCKS, MD;  Location: [facility] ENDOSCOPY;  Service: Cardiovascular;;   IR ANGIO INTRA EXTRACRAN SEL COM CAROTID INNOMINATE UNI L MOD SED  10/11/2020   IR ANGIO VERTEBRAL SEL VERTEBRAL UNI L MOD SED  10/11/2020   IR CT HEAD LTD  10/11/2020   IR CT HEAD LTD  10/12/2020   IR PERCUTANEOUS ART THROMBECTOMY/INFUSION INTRACRANIAL INC DIAG ANGIO  10/11/2020   IR PERCUTANEOUS ART THROMBECTOMY/INFUSION INTRACRANIAL INC DIAG ANGIO  10/12/2020   NO PAST SURGERIES     PATENT FORAMEN OVALE(PFO) CLOSURE N/A 12/10/2020    Procedure: PATENT FORAMEN OVALE (PFO) CLOSURE;  Surgeon: Ladona Heinz, MD;  Location: [facility] INVAS[facility] CV [facility];  Service: Cardiovascular;  Laterality: N/A;   PATENT FORAMEN OVALE(PFO) CLOSURE N/A 06/16/2023   Procedure: PATENT FORAMEN OVALE(PFO) CLOSURE;  Surgeon: Ladona Heinz, MD;  Location: [facility];  Service: Cardiovascular;  Laterality: N/A;   RADIOLOGY WITH ANESTHESIA N/A 10/12/2020   Procedure: RADIOLOGY WITH ANESTHESIA;  Surgeon: Radiologist, Medication, MD;  Location: [facility] OR;  Service: Radiology;  Laterality: N/A;   RADIOLOGY WITH ANESTHESIA N/A 10/11/2020   Procedure: IR WITH ANESTHESIA;  Surgeon: Radiologist, Medication, MD;  Location: [facility] OR;  Service: Radiology;  Laterality: N/A;   TEE WITHOUT CARDIOVERSION N/A 10/17/2020   Procedure: TRANSESOPHAGEAL ECHOCARDIOGRAM (TEE);  Surgeon: Jeffrie Oneil BROCKS, MD;  Location: [facility] ENDOSCOPY;  Service: Cardiovascular;  Laterality: N/A;   TRANSESOPHAGEAL ECHOCARDIOGRAM (CATH LAB) N/A 05/05/2023   Procedure: TRANSESOPHAGEAL ECHOCARDIOGRAM;  Surgeon: Mona Vinie BROCKS, MD;  Location: [facility] CV [facility];  Service: Cardiovascular;  Laterality: N/A;   Patient Active Problem List   Diagnosis Date Noted   Supervision of high risk pregnancy, antepartum 03/22/2023   Breast mass 09/09/2022   Depression/anxiety 04/22/2021   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   Cannabis use disorder, severe, dependence (HCC)  daily 03/24/2021   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   History of stroke x2 10/2020 12/16/2020   Depression, major, single episode, moderate (HCC) 12/16/2020   PFO (patent foramen ovale) 12/09/2020   Anemia    Hemiparesis affecting left side as late effect of stroke (HCC)    Leukocytosis    Dyslipidemia    Migraines  05/09/2019   LGSIL on Pap smear of cervix 08/18/2018   Generalized anxiety disorder 07/26/2017   ADD (attention deficit disorder) 07/07/2017   Maternal varicella, non-immune 08/01/2015     ONSET DATE: Stroke Sept 2022; L knee pain started last May 2024  REFERRING DIAG: P30.645 (ICD-10-CM) - Hemiparesis affecting left side as late effect of stroke (HCC)  THERAPY DIAG:  Hemiparesis affecting left side as late effect of stroke (HCC)  Muscle weakness (generalized)  Other lack of coordination  Other abnormalities of gait and mobility  Left knee pain, unspecified chronicity  Pain in right wrist  Other symptoms and signs involving the musculoskeletal system  Rationale for Evaluation and Treatment: Rehabilitation  SUBJECTIVE:                                                                                                                                                                                             SUBJECTIVE STATEMENT: Pt has started first injected blood thinner - reports feeling fine today.  She ambulates into DWB independently.  Pt endorses mild fear of water - PT reviews depths of water compared to pt height and likely staying in shallow water today for acclimation - pt agreeable. Pt accompanied by: self  PERTINENT HISTORY:  Per [name], MD note 11/05/23: Hx R MCA infarct in 2022, patient completed both inpatient and outpatient rehabilitation at that time had very good outcome returning to work.  Despite that she did have some persistent left hand incoordination and mild left lower extremity weakness.   No longer working at [employer], she tried working at another job but this had longer hours.  She then became pregnant and now no longer works.  She does walk about 7000 steps a day but does no other exercise   The patient has had no falls or injuries.  Her left wrist feels okay her right wrist has dorsal pain that she notes mainly when she is driving.  She has no numbness or tingling in the fingers at night or while she is driving.  She has noted no swelling in the wrist joint.  No elbow pain. Left knee pain is relieved by an elastic  knee sleeve.  She states that it is mainly in the front  of the knee rather than behind the knee.  She has not noted swelling of her knee.  No numbness tingling shooting down the left lower extremity from her back.  PMH includes asthma, migraine HA, R MCA stroke (h/o 2 ischemic strokes in Sept 2022--residual weakness to L face, arm, and leg), PFO closure.  Not currently on anti-coagulation.  PAIN:  Are you having pain? Yes: NPRS scale: 0/10 Pain location: L knee Pain description: fatigue Aggravating factors: working/activity Relieving factors: elevation  PRECAUTIONS: Other: Pregnancy (No specific precautions due to pregnancy per CNM 11/11/23); Per CNM 11/19/23: Recommend against hot tubs or temps over 100F.  Pt reporting starting anti-coagulation 11/23/23.  RED FLAGS: None   WEIGHT BEARING RESTRICTIONS: No  FALLS: Has patient fallen in last 6 months? No  LIVING ENVIRONMENT: Lives with: lives with their family (pt's mother), lives with their partner, and lives with their son (64 y.o.) Lives in: House/apartment Stairs: Yes: External: 3 steps; none Has following equipment at home: shower chair  PLOF: Independent and Needs assistance with ADLs; boyfriend helps tie shoes or assists with fine motor activities with L hand; (+) driving; currently not working  PATIENT GOALS: To not have to use L knee brace; improve L knee pain.  Improve balance (in/out of shower) and L LE strength.  OBJECTIVE:  Note: Objective measures were completed at Evaluation unless otherwise noted.  DIAGNOSTIC FINDINGS:  CT Head 08/29/23: 1. No evidence of acute intracranial abnormality. 2. Chronic right MCA infarct. 3. Right sphenoid sinus fluid, correlate for acute sinusitis.  COGNITION: Overall cognitive status: Within functional limits for tasks assessed   SENSATION: Light touch: WFL B LE's  COORDINATION: L LE fatigues with decreased quality with rapid alternating toe taps (in sitting)  EDEMA:  WNL B  LE's  MUSCLE TONE: Catch noted with quick stretch to L ankle into DF (normal R)  POSTURE: No Significant postural limitations  LOWER EXTREMITY ROM:     Active  Right Eval Left Eval  Hip flexion WNL WNL  Hip extension    Hip abduction    Hip adduction    Hip internal rotation    Hip external rotation    Knee flexion WNL WNL  Knee extension WNL WNL  Ankle dorsiflexion WNL WNL  Ankle plantarflexion WNL WNL  Ankle inversion    Ankle eversion     (Blank rows = not tested)  LOWER EXTREMITY MMT:    MMT Right Eval Left Eval  Hip flexion 4+/5 4+/5  Hip extension    Hip abduction    Hip adduction    Hip internal rotation    Hip external rotation    Knee flexion 4+/5 4+/5  Knee extension 4+/5 4+/5  Ankle dorsiflexion 4+/5 4+/5  Ankle plantarflexion    Ankle inversion    Ankle eversion    (Blank rows = not tested)  BED MOBILITY:  Reports being independent but sometimes takes extra time if gets dizzy (dizziness goes away pretty quick)  TRANSFERS: Sit to stand: Complete Independence  Assistive device utilized: None     Stand to sit: Complete Independence  Assistive device utilized: None      STAIRS: Not tested GAIT: Findings: Gait Characteristics: step through pattern and decreased ankle dorsiflexion- Left, Distance walked: clinic distances, Assistive device utilized:None, Level of assistance: Complete Independence, and Comments: pt reports L LE feeling weak during ambulation  FUNCTIONAL TESTS:  5 times sit to stand: 19.37 seconds; no UE support FGA: 21/30 (11/16/23)  FUNCTIONAL GAIT ASSESSMENT  Date:  11/16/23 Score  GAIT LEVEL SURFACE Instructions: Walk at your normal speed from here to the next mark (6 m) [20 ft]. (1) Moderate impairment-Walks 6 m (20 ft), slow speed, abnormal gait pattern, evidence for imbalance, or deviates 25.4 - 38.1 cm (10 -15 in) outside of the 30.48-cm (12-in) walkway width. Requires more than 7 seconds to ambulate 6 m (20 ft). 7.97 seconds   2.   CHANGE IN GAIT SPEED Instructions: Begin walking at your normal pace (for 1.5 m [5 ft]). When I tell you "go," walk as fast as you can (for 1.5 m [5 ft]). When I tell you "slow," walk as slowly as you can (for 1.5 m [5 ft]. (3) Normal - Able to smoothly change walking speed without loss of balance or gait deviation. Shows a significant difference in walking speeds between normal, fast, and slow speeds. Deviates no more than 15.24 cm (6 in) outside of the 30.48-cm (12-in) walkway width.  3.    GAIT WITH HORIZONTAL HEAD TURNS Instructions: Walk from here to the next mark 6 m (20 ft) away. Begin walking at your normal pace. Keep walking straight; after 3 steps, turn your head to the right and keep walking straight while looking to the right. After 3 more steps, turn your head to the left and keep walking straight while looking left. Continue alternating looking right and left. (3) Normal - Performs head turns smoothly with no change in gait. Deviates no more than 15.24 cm (6 in) outside 30.48-cm (12-in) walkway width.  4.   GAIT WITH VERTICAL HEAD TURNS Instructions: Walk from here to the next mark (6 m [20 ft]). Begin walking at your normal pace. Keep walking straight; after 3 steps, tip your head up and keep walking straight while looking up. After 3 more steps, tip your head down, keep walking straight while looking down. Continue  alternating looking up and down every 3 steps until you have completed 2 repetitions in each direction. (2) Mild impairment - Performs task with slight change in gait velocity (eg, minor disruption to smooth gait path), deviates 15.24 -25.4 cm (6 -10 in) outside 30.48-cm (12-in) walkway width or uses assistive device.  5.  GAIT AND PIVOT TURN Instructions: Begin with walking at your normal pace. When I tell you, "turn and stop," turn as quickly as you can to face the opposite direction and stop. (3) Normal - Pivot turns safely within 3 seconds and stops quickly with no loss  of balance  6.   STEP OVER OBSTACLE Instructions: Begin walking at your normal speed. When you come to the shoe box, step over it, not around it, and keep walking. (2) Mild impairment - Is able to step over one shoe box (11.43 cm [4.5 in] total height) without changing gait speed; no evidence of imbalance.  7.   GAIT WITH NARROW BASE OF SUPPORT Instructions: Walk on the floor with arms folded across the chest, feet aligned heel to toe in tandem for a distance of 3.6 m [12 ft]. The number of steps taken in a straight line are counted for a maximum of 10 steps. (2) Mild impairment - Ambulates 7-9 steps  8.   GAIT WITH EYES CLOSED Instructions: Walk at your normal speed from here to the next mark (6 m [20 ft]) with your eyes closed. (1) Moderate impairment - Walks 6 m (20 ft), slow speed, abnormal gait pattern, evidence for imbalance, deviates 25.4 -38.1 cm (10 -15 in) outside 30.48-cm (12-in) walkway width. Requires  more than 9 seconds to ambulate 6 m (20 ft).12.94 seconds  9.   AMBULATING BACKWARDS Instructions: Walk backwards until I tell you to stop (2) Mild impairment - Walks 6 m (20 ft), uses assistive device, slower speed, mild gait deviations, deviates 15.24 -25.4 cm (6 -10 in) outside 30.48-cm (12-in) walkway width 21.69 seconds  10. STEPS Instructions: Walk up these stairs as you would at home (ie, using the rail if necessary). At the top turn around and walk down. (2) Mild impairment-Alternating feet, must use rail.  Total 21/30   Interpretation of scores: Non-Specific Older Adults Cutoff Score: <=22/30 = risk of falls Parkinson's Disease Cutoff score <15/30= fall risk (Hoehn & Yahr 1-4)  Minimally Clinically Important Difference (MCID)  Stroke (acute, subacute, and chronic) = MDC: 4.2 points Vestibular (acute) = MDC: 6 points Community Dwelling Older Adults =  MCID: 4 points Parkinson's Disease  =  MDC: 4.3 points  (Academy of Neurologic Physical Therapy (nd). Functional Gait  Assessment. Retrieved from https://www.neuropt.org/docs/default-source/cpgs/core-outcome-measures/function-gait-assessment-pocket-guide-proof9-(2).pdf?sfvrsn=b40f35043_0.)  PATIENT SURVEYS:  LEFS: 35/80; 43.75% (11/16/23)  Extreme difficulty/unable (0), Quite a bit of difficulty (1), Moderate difficulty (2), Little difficulty (3), No difficulty (4) Survey date:  11/16/23  Any of your usual work, housework or school activities 1  2. Usual hobbies, recreational or sporting activities 2  3. Getting into/out of the bath 1  4. Walking between rooms 4  5. Putting on socks/shoes 1  6. Squatting  2  7. Lifting an object, like a bag of groceries from the floor 4  8. Performing light activities around your home 3  9. Performing heavy activities around your home 1  10. Getting into/out of a car 4  11. Walking 2 blocks 3  12. Walking 1 mile 1  13. Going up/down 10 stairs (1 flight) 3  14. Standing for 1 hour 0  15.  sitting for 1 hour 3  16. Running on even ground 0  17. Running on uneven ground 0  18. Making sharp turns while running fast 0  19. Hopping  0  20. Rolling over in bed 2 (d/t pregnancy)  Score total:  35/80      Vitals:  Beginning of session: There were no vitals filed for this visit.  End of session: BP R UE sitting 104/57 with HR 61 bpm                                                                                                                              TREATMENT DATE: 11/25/23 Aquatic therapy at [facility] - pool temperature 91 degrees   Patient seen for aquatic therapy today.  Treatment took place in water 3.6 feet deep (due to pt fear of water) depending upon activity.  Patient entered and exited the pool via stairs reciprocally using R rail only at SBA level.   Exercises: -water walking warmup 4x18 ft forward > backward > laterally IND -Lateral step out w/ multicolor low resistance dumbbell  abduction/adduction 4x18 ft -STS no UE support 2x10 -Marching w/  contralateral low resistance multicolor dumbbell flexion 4x18 ft, some difficulty w/ coordination of task; pt has significant LLE fatigue following task requiring water walking and seated recovery x4 minutes  -5 forward step ups intermittent fingertip support 2x10 each LE > lateral step ups x10 each LE -Wall plank w/ kickbacks 2x20 -Standing hip circles x10 forward > x10 backward each LE - pt reports L hip relief -Standing hip openers x10 each LE -Wall mini squats 2x10  Patient requires buoyancy of the water for support for reduced fall risk with gait training and balance exercises with SBA support. Exercises able to be performed safely in water without the risk of fall compared to those same exercises performed on land; viscosity of water needed for resistance for strengthening. Current of water provides perturbations for challenging static and dynamic balance.   PATIENT EDUCATION: Education details: Continue walking program; aquatic rationale.  Encouraged symptom monitoring on new medication and monitoring BP if able.  Edu on no hot tub due to temperature - pt understanding. Person educated: Patient Education method: Explanation Education comprehension: verbalized understanding  HOME EXERCISE PROGRAM: 11/18/23: You Can Walk For A Certain Length Of Time Each Day                          Walk 10 minutes 1-2 times per day.             Increase 3-4  minutes every 7-14 days              Work up to 20-30 minutes (1-2 times per day).               Example:                         Day 1-2           4-5 minutes     3 times per day                         Day 7-8           10-12 minutes 2-3 times per day                         Day 13-14       20-22 minutes 1-2 times per day  11/16/23 Access Code: [email or web address] URL: [email or web address] Date: 11/16/2023 Prepared by: Damien Caulk  Exercises - Standing March with Counter Support  - 1 x daily - 5 x weekly - 1-2 sets - 10 reps -  Heel Raises with Counter Support  - 1 x daily - 5 x weekly - 1-2 sets - 10 reps  GOALS: Goals reviewed with patient? Yes  SHORT TERM GOALS: Equals long term goals based on length of POC  LONG TERM GOALS: Target date: 12/10/2023  Pt will be independent with final HEP in order to improve strength and balance in order to decrease fall risk and improve function at home for ADL's.  Baseline: To be issued (not issued d/t time) Goal status: INITIAL  2.  Pt will decrease 5 Time Sit to Stand by at least 3 seconds in order to demonstrate clinically significant improvement in LE strength.  Baseline: 19.37 seconds (Eval) Goal status: INITIAL  3.  Assess FGA and update FGA goal based on results. Baseline: 21/30 (11/16/23)  Goal status: INITIAL  4.  Pt will increase LEFS by at least 9 points in order to demonstrate significant improvement in lower extremity function.   Baseline: 35/80 (11/16/23) Goal status: INITIAL  5. Patient will be able to perform household work/chores without increase in L knee symptoms.  Baseline: L knee pain increases with activities.  Goal status: INITIAL    ASSESSMENT:  CLINICAL IMPRESSION: Patient was seen today for initial aquatic visit.  She has no pain today and tolerates interventions with only mild fatigue.  She is able to overcome mild fear of water well with maintained shallow depth today.  She found relief from hip circles and openers and was challenged by tendency to over extend lumbar spine during hip extensions movements.  Used closed chain tasks to increase glute engagement without taxing lumbar spine with positive pain response.  She continues to benefit from land and aquatic POC to improve self efficacy, pain management and LLE strength and mechanics of gait.  Continue per POC.  OBJECTIVE IMPAIRMENTS: Abnormal gait, decreased activity tolerance, decreased balance, decreased coordination, decreased endurance, decreased mobility, decreased strength, and  pain.   ACTIVITY LIMITATIONS: carrying, lifting, bending, standing, squatting, stairs, locomotion level, and caring for others  PARTICIPATION LIMITATIONS: cleaning, shopping, community activity, occupation, yard work, and church  PERSONAL FACTORS: Past/current experiences, Time since onset of injury/illness/exacerbation, and 3+ comorbidities: R MCA stroke, migraine HA, L knee pain, R wrist pain are also affecting patient's functional outcome.   REHAB POTENTIAL: Good  CLINICAL DECISION MAKING: Evolving/moderate complexity  EVALUATION COMPLEXITY: Moderate  PLAN:  PT FREQUENCY: 2x/week  PT DURATION: 4 weeks  PLANNED INTERVENTIONS: [id number]- PT Re-evaluation, 97750- Physical Performance Testing, 97110-Therapeutic exercises, 97530- Therapeutic activity, [id number]- Neuromuscular re-education, 97535- Self Care, [id number]- Manual therapy, [id number]- Gait training, [id number]- Orthotic Initial, [id number]- Orthotic/Prosthetic subsequent, [phone]- Canalith repositioning, [phone]- Aquatic Therapy, Patient/Family education, Balance training, Stair training, Taping, Vestibular training, DME instructions, Cryotherapy, and Moist heat  PLAN FOR NEXT SESSION: Sci-Fit; L LE strengthening; pain control; balance; progress HEP; walking program; follow-up on aquatic appts; Foot-up brace?  Try hip circles and openers at ballet bar/counter to see if pt has relief on land as well!   Daved KATHEE Bull, PT, DPT 11/25/2023, 11:49 AM

## 2023-11-26 ENCOUNTER — Other Ambulatory Visit

## 2023-11-29 ENCOUNTER — Ambulatory Visit (INDEPENDENT_AMBULATORY_CARE_PROVIDER_SITE_OTHER): Admitting: Certified Nurse Midwife

## 2023-11-29 VITALS — BP 111/57 | HR 67 | Wt 176.6 lb

## 2023-11-29 DIAGNOSIS — O0990 Supervision of high risk pregnancy, unspecified, unspecified trimester: Secondary | ICD-10-CM | POA: Diagnosis not present

## 2023-11-29 DIAGNOSIS — Z3A19 19 weeks gestation of pregnancy: Secondary | ICD-10-CM

## 2023-11-29 DIAGNOSIS — Z79899 Other long term (current) drug therapy: Secondary | ICD-10-CM | POA: Diagnosis not present

## 2023-11-29 DIAGNOSIS — F329 Major depressive disorder, single episode, unspecified: Secondary | ICD-10-CM

## 2023-11-29 DIAGNOSIS — F411 Generalized anxiety disorder: Secondary | ICD-10-CM | POA: Diagnosis not present

## 2023-11-29 DIAGNOSIS — O99340 Other mental disorders complicating pregnancy, unspecified trimester: Secondary | ICD-10-CM

## 2023-11-29 DIAGNOSIS — F323 Major depressive disorder, single episode, severe with psychotic features: Secondary | ICD-10-CM

## 2023-11-29 MED ORDER — HYDROXYZINE HCL 25 MG PO TABS: 25.0000 mg | ORAL_TABLET | Freq: Four times a day (QID) | ORAL | 2 refills | Status: AC | PRN

## 2023-11-29 MED ORDER — VENLAFAXINE HCL ER 75 MG PO CP24: 75.0000 mg | ORAL_CAPSULE | Freq: Every day | ORAL | 3 refills | Status: DC

## 2023-11-30 ENCOUNTER — Ambulatory Visit: Admitting: Occupational Therapy

## 2023-11-30 ENCOUNTER — Encounter: Payer: Self-pay | Admitting: Physical Therapy

## 2023-11-30 ENCOUNTER — Encounter: Payer: Self-pay | Admitting: Occupational Therapy

## 2023-11-30 ENCOUNTER — Ambulatory Visit: Admitting: Physical Therapy

## 2023-11-30 DIAGNOSIS — R29898 Other symptoms and signs involving the musculoskeletal system: Secondary | ICD-10-CM

## 2023-11-30 DIAGNOSIS — R278 Other lack of coordination: Secondary | ICD-10-CM

## 2023-11-30 DIAGNOSIS — I69354 Hemiplegia and hemiparesis following cerebral infarction affecting left non-dominant side: Secondary | ICD-10-CM | POA: Diagnosis not present

## 2023-11-30 DIAGNOSIS — M6281 Muscle weakness (generalized): Secondary | ICD-10-CM

## 2023-11-30 DIAGNOSIS — M25531 Pain in right wrist: Secondary | ICD-10-CM

## 2023-11-30 DIAGNOSIS — R2689 Other abnormalities of gait and mobility: Secondary | ICD-10-CM

## 2023-11-30 NOTE — Therapy (Addendum)
 OUTPATIENT OCCUPATIONAL THERAPY NEURO TREATMENT  Patient Name: Kristen Grant MRN: [id number] DOB:09-Nov-1995, 28 y.o., female Today's Date: 11/30/2023  PCP: none  REFERRING PROVIDER: [name], MD  END OF SESSION:  OT End of Session - 11/30/23 1105     Visit Number 5    Number of Visits 13    Date for Recertification  01/10/24    Authorization Type [employer]    Authorization Time Period approved 12 visits from 10/14 - 02/01/24    Authorization - Visit Number 4    Authorization - Number of Visits 12    OT Start Time 1103    OT Stop Time 1145    OT Time Calculation (min) 42 min    Equipment Utilized During Treatment red theraputty, elastic shoelaces for demo, rocker knife for demo    Activity Tolerance Patient tolerated treatment well    Behavior During Therapy WFL for tasks assessed/performed            Past Medical History:  Diagnosis Date   Anxiety    Asthma    no treatments required at this time   Chlamydia 2023   Depression    Gonorrhea 2024   Migraine headache    Missed abortion with fetal demise before 20 completed weeks of gestation 04/12/2023   Nicotine vapor product user 10/06/2019   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   Stroke (HCC)    hx 2 ischemic strokesin September 2022--residual weakness to L face, arm and leg   Past Surgical History:  Procedure Laterality Date   BUBBLE STUDY  10/17/2020   Procedure: BUBBLE STUDY;  Surgeon: Jeffrie Oneil BROCKS, MD;  Location: [facility];  Service: Cardiovascular;;   IR ANGIO INTRA EXTRACRAN SEL COM CAROTID INNOMINATE UNI L MOD SED  10/11/2020   IR ANGIO VERTEBRAL SEL VERTEBRAL UNI L MOD SED  10/11/2020   IR CT HEAD LTD  10/11/2020   IR CT HEAD LTD  10/12/2020   IR PERCUTANEOUS ART THROMBECTOMY/INFUSION INTRACRANIAL INC DIAG ANGIO  10/11/2020   IR PERCUTANEOUS ART THROMBECTOMY/INFUSION INTRACRANIAL INC DIAG ANGIO  10/12/2020   NO PAST SURGERIES     PATENT FORAMEN OVALE(PFO) CLOSURE N/A 12/10/2020    Procedure: PATENT FORAMEN OVALE (PFO) CLOSURE;  Surgeon: Ladona Heinz, MD;  Location: [facility];  Service: Cardiovascular;  Laterality: N/A;   PATENT FORAMEN OVALE(PFO) CLOSURE N/A 06/16/2023   Procedure: PATENT FORAMEN OVALE(PFO) CLOSURE;  Surgeon: Ladona Heinz, MD;  Location: [facility];  Service: Cardiovascular;  Laterality: N/A;   RADIOLOGY WITH ANESTHESIA N/A 10/12/2020   Procedure: RADIOLOGY WITH ANESTHESIA;  Surgeon: Radiologist, Medication, MD;  Location: [facility] OR;  Service: Radiology;  Laterality: N/A;   RADIOLOGY WITH ANESTHESIA N/A 10/11/2020   Procedure: IR WITH ANESTHESIA;  Surgeon: Radiologist, Medication, MD;  Location: [facility] OR;  Service: Radiology;  Laterality: N/A;   TEE WITHOUT CARDIOVERSION N/A 10/17/2020   Procedure: TRANSESOPHAGEAL ECHOCARDIOGRAM (TEE);  Surgeon: Jeffrie Oneil BROCKS, MD;  Location: [facility] ENDOS[facility];  Service: Cardiovascular;  Laterality: N/A;   TRANSESOPHAGEAL ECHOCARDIOGRAM (CATH LAB) N/A 05/05/2023   Procedure: TRANSESOPHAGEAL ECHOCARDIOGRAM;  Surgeon: Mona Vinie BROCKS, MD;  Location: [facility];  Service: Cardiovascular;  Laterality: N/A;   Patient Active Problem List   Diagnosis Date Noted   Supervision of high risk pregnancy, antepartum 03/22/2023   Breast mass 09/09/2022   Depression/anxiety 04/22/2021   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   Cannabis use disorder, severe, dependence (HCC) daily 03/24/2021  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   History of stroke x2 10/2020 12/16/2020   Depression, major, single episode, moderate (HCC) 12/16/2020   PFO (patent foramen ovale) 12/09/2020   Anemia    Hemiparesis affecting left side as late effect of stroke (HCC)    Leukocytosis    Dyslipidemia    Migraines  05/09/2019   LGSIL on Pap smear of cervix 08/18/2018   Generalized anxiety disorder 07/26/2017   ADD (attention deficit disorder) 07/07/2017   Maternal varicella, non-immune 08/01/2015     ONSET DATE: 11/05/2023  REFERRING DIAG: History of right MCA distribution infarct with residual mild left hemiparesis and motor control issues Note:  RIght wrist pain no trauma as well as vestibular eval  THERAPY DIAG:  Hemiparesis affecting left side as late effect of stroke (HCC)  Muscle weakness (generalized)  Other lack of coordination  Pain in right wrist  Other symptoms and signs involving the musculoskeletal system  Rationale for Evaluation and Treatment: Rehabilitation  SUBJECTIVE:   SUBJECTIVE STATEMENT: Pt reports no pain today or most days, and reports only 1 day in 2-3 weeks where there has been pain in Rt wrist  Pt accompanied by: self  PERTINENT HISTORY: per MD note on 11/05/23: History of right MCA distribution infarct (2022) with residual mild left hemiparesis and motor control issues. Right wrist pain no evidence of trauma it is mainly in the dorsal area. She has a remote history of wrist fracture in 2008. She could certainly be developing some posttraumatic arthritis at this point though does not look like she has any joint tenderness or inflammation. Will send her to OT. Have advised wearing a Velcro wrist splint when driving  PRECAUTIONS: Other: pregnant - caution w/ modality use  WEIGHT BEARING RESTRICTIONS: No  PAIN:  Are you having pain? Rt wrist 0/10, intermittent w/ certain tasks aggravating it.   FALLS: Has patient fallen in last 6 months? No  LIVING ENVIRONMENT: Lives with: lives with their family (mom, boyfriend, 31 y.o. son)  Lives in: 1 level home Has following equipment at home: shower chair but doesn't use  PLOF: Independent with basic ADLs and stopped working since pregnant   PATIENT GOALS: decrease Rt wrist pain, use Lt hand more, braid hair and tie shoes better  OBJECTIVE:  Note: Objective measures were completed at Evaluation unless otherwise noted.  HAND DOMINANCE: Right  ADLs:  Eating: assist w/ cutting food Grooming:  assist to wash hair, mod I for brushing teeth and brushing hair but can't braid hair UB Dressing: mod I  LB Dressing: mod I - but usually gets help to tied shoes Toileting: independent Bathing: distant supervision Tub Shower transfers: distant supervision because pt gets dizzy. Assist to get out of tub if pt bathes vs shower. Equipment: has shower chair but doesn't use b/c it takes up too much room  IADLs: Shopping: pt orders online and then picks up  Light housekeeping: pt does laundry (boyfriend and mom does)  Meal Prep: boyfriend does mostly, but pt helps w/ side dishes - assist for pots Community mobility: drives [employer] Medication management: [employer] Financial management: boyfriend does Handwriting: no changes  MOBILITY STATUS: Independent    FUNCTIONAL OUTCOME MEASURES: Upper Extremity Functional Scale (UEFS): 48.75% functioning (51.25% deficit)   UPPER EXTREMITY ROM:  BUE AROM WFL's but difficulty w/ coordination Lt hand   UPPER EXTREMITY MMT:     MMT Right eval Left eval  Shoulder flexion 5 3+  Shoulder abduction 5 4  Shoulder adduction  Shoulder extension    Shoulder internal rotation 5 4  Shoulder external rotation 5 4  Middle trapezius    Lower trapezius    Elbow flexion    Elbow extension    Wrist flexion    Wrist extension    Wrist ulnar deviation    Wrist radial deviation    Wrist pronation    Wrist supination    (Blank rows = not tested)  HAND FUNCTION: Grip strength: Right: 62.1 lbs; Left: 32.6 lbs  COORDINATION: 9 Hole Peg test: Right: 21.52 sec; Left: 37.83 sec Box and Blocks:  Left 35 blocks w/ fatigue  SENSATION: Light touch: WFL Hot/Cold: Impaired  and diminished  EDEMA: none   COGNITION: Overall cognitive status: Within functional limits for tasks assessed  VISION: Subjective report: no changes but does have dizziness Baseline vision: No visual deficits Visual history: none  VISION ASSESSMENT: Not tested   PERCEPTION: Not  tested  PRAXIS: Not tested  OBSERVATIONS: new Rt wrist pain which pt reports is due to overuse from not using Lt hand as much, pain mostly with driving, trying to hold pots/pans, and trying to hold/use cellphone. Decreased coordination Lt hand since stroke 2022 and some learned non use. Lt small finger abducts out and pt doesn't use as much however does demo in hand manipulation for checkers                                                                                                                             TREATMENT DATE: 11/30/23   Reviewed previously issued education and HEP's. Pt with no further questions.  Pt also reports no pain in wrist except 1 day in last 2-3 weeks   Specifically focused on STG #3 today w/ focus on further task modifications and A/E needs. Practiced cutting food with large built up handle on fork (holding in Lt hand) cutting with Rt hand and non slip dycem under plate. Discussed steering wheel knob for making sharp turns easier and told how to get attached to car if needed. Discussed specific attachments for phone to relieve Rt hand stress/pan including: pop socket, finger ring holder, and flip stand holder.  Also provided pt with A/E for safety including: pot drainer attachment, pot stabilizer for stovetop, and one handed cutting board.  Also provided patient w/ handouts on various shoelaces adaptations (spyrolaces, elastic laces, one handed devices including shoe buttons) Pt practiced w/ use of shoe buttons   PATIENT EDUCATION: Education details: SEE ABOVE Person educated: Patient Education method: Explanation Education comprehension: verbalized understanding  HOME EXERCISE PROGRAM: 11/16/23: Coordination and Wrist isometrics (ACCESS CODE [id number])  11/18/23: Putty for L hand with red putty [facility])   11/23/23: Wrist stretches (ACCESS CODE [id number])   GOALS: Goals reviewed with patient? Yes  SHORT TERM GOALS: Target date: 12/11/23  Independent  with HEP for Lt hand coordination and grip strength Baseline: not yet addressed/issued Goal status: MET   2.  Independent with isometric wrist  strengthening HEP for Rt wrist  Baseline: not yet addressed/issued Goal status: IN PROGRESS  3.  Pt to verbalize understanding with task modifications and potential A/E to decrease pain Rt wrist and increase ease/independence and safety (possible wrist brace prn, modifications to steering wheel, cell phone attachment, cutting food, holding pot/pan, alternative shoelace options prn) Baseline: not yet addressed/issued Goal status: MET  4.  Pt to be independent with activities for Lt hand (m-CIMT activities) to promote Lt hand assist for tasks and to decrease learned non use  Baseline: not yet addressed/issued Goal status: IN PROGRESS  5.  Pt to verbalize understanding with safety considerations d/t sensory loss Lt hand Baseline: not yet addressed/issued Goal status: IN PROGRESS   LONG TERM GOALS: Target date: 01/10/24  Pt to report greater ease with tying shoes, holding cell phone, and using steering wheel w/ pain Rt wrist less than or equal to 4/10  Baseline: up to 8/10 Goal status: IN PROGRESS  2.  Pt to simulate changing diaper in prep for new infant care at mod I level safely Baseline: not yet addressed/issued Goal status: IN PROGRESS  3.  Pt to braid hair w/ task modifications and A/E prn  Baseline: dependent Goal status: IN PROGRESS  4.  Pt to improve coordination Lt hand as evidenced by reducing speed on 9 hole peg test to 33 sec or less Baseline: 37 sec Goal status: INITIAL  5.  Pt to improve grip strength Lt hand to 38 lbs or greater to assist more in functional tasks Baseline: 32 lbs Goal status: INITIAL   ASSESSMENT:  CLINICAL IMPRESSION: Patient is a 28 y.o. female who was seen today for occupational therapy tx for Lt hemiparesis (from stroke in 2022) w/ some learned non use and new Rt wrist pain possibly from overuse  and OA. Hx includes CVA. Pt is also currently pregnant. Pt has met 2 STG's at this time and progressing towards remaining goals. Pain has improved in Rt wrist d/t using Lt hand more functionally and isometric ex's for Rt wrist. Pt at this time would benefit from continued OT services to improve ability to complete ADL/IADL tasks with improved strength and coordination and reduced pain.   PERFORMANCE DEFICITS: in functional skills including ADLs, IADLs, coordination, dexterity, sensation, strength, pain, Fine motor control, Gross motor control, mobility, balance, body mechanics, decreased knowledge of use of DME, and UE functional use.   IMPAIRMENTS: are limiting patient from ADLs, IADLs, leisure, and childcare needs.   CO-MORBIDITIES: may have co-morbidities  that affects occupational performance. Patient will benefit from skilled OT to address above impairments and improve overall function.  MODIFICATION OR ASSISTANCE TO COMPLETE EVALUATION: No modification of tasks or assist necessary to complete an evaluation.  OT OCCUPATIONAL PROFILE AND HISTORY: Detailed assessment: Review of records and additional review of physical, cognitive, psychosocial history related to current functional performance.  CLINICAL DECISION MAKING: Moderate - several treatment options, min-mod task modification necessary  REHAB POTENTIAL: Good  EVALUATION COMPLEXITY: Moderate    PLAN:  OT FREQUENCY: 2x/week  OT DURATION: 6 weeks (plus eval)   PLANNED INTERVENTIONS: 97535 self care/ADL training, 02889 therapeutic exercise, 97530 therapeutic activity, 97112 neuromuscular re-education, 97140 manual therapy, 97018 paraffin, 02960 fluidotherapy, 97010 moist heat, 97010 cryotherapy, 97034 contrast bath, 97760 Orthotic Initial, 97763 Orthotic/Prosthetic subsequent, passive range of motion, patient/family education, and DME and/or AE instructions  RECOMMENDED OTHER SERVICES: none at this time  CONSULTED AND AGREED WITH  PLAN OF CARE: Patient  PLAN FOR NEXT SESSION: light  wrist strengthening HEP w/ 1-2 lb dumb bells,  coordination tasks for LUE and BUE tasks   Endurance for LUE    For all possible CPT codes, reference the Planned Interventions line above.     Check all conditions that are expected to impact treatment: {Conditions expected to impact treatment:Neurological condition and/or seizures and Current pregnancy or recent postpartum   If treatment provided at initial evaluation, no treatment charged due to lack of authorization.        Burnard JINNY Roads, OT 11/30/2023, 1:26 PM

## 2023-11-30 NOTE — Therapy (Signed)
 OUTPATIENT PHYSICAL THERAPY NEURO TREATMENT   Patient Name: Kristen Grant MRN: [id number] DOB:10-22-1995, 28 y.o., female Today's Date: 11/30/2023   PCP: No PCP REFERRING PROVIDER: [name], MD  END OF SESSION:  PT End of Session - 11/30/23 1147     Visit Number 6    Number of Visits 9   8 plus Eval   Date for Recertification  12/17/23   For scheduling delays   Authorization Type [place] MEDICAID [facility]    Authorization Time Period 10/14-11/22/25    Authorization - Visit Number 6    Authorization - Number of Visits 8    PT Start Time 1148    PT Stop Time 1228    PT Time Calculation (min) 40 min    Equipment Utilized During Treatment Gait belt    Activity Tolerance Patient tolerated treatment well    Behavior During Therapy WFL for tasks assessed/performed          Past Medical History:  Diagnosis Date   Anxiety    Asthma    no treatments required at this time   Chlamydia 2023   Depression    Gonorrhea 2024   Migraine headache    Missed abortion with fetal demise before 20 completed weeks of gestation 04/12/2023   Nicotine vapor product user 10/06/2019   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   Stroke (HCC)    hx 2 ischemic strokesin September 2022--residual weakness to L face, arm and leg   Past Surgical History:  Procedure Laterality Date   BUBBLE STUDY  10/17/2020   Procedure: BUBBLE STUDY;  Surgeon: Jeffrie Oneil BROCKS, MD;  Location: [facility];  Service: Cardiovascular;;   IR ANGIO INTRA EXTRACRAN SEL COM CAROTID INNOMINATE UNI L MOD SED  10/11/2020   IR ANGIO VERTEBRAL SEL VERTEBRAL UNI L MOD SED  10/11/2020   IR CT HEAD LTD  10/11/2020   IR CT HEAD LTD  10/12/2020   IR PERCUTANEOUS ART THROMBECTOMY/INFUSION INTRACRANIAL INC DIAG ANGIO  10/11/2020   IR PERCUTANEOUS ART THROMBECTOMY/INFUSION INTRACRANIAL INC DIAG ANGIO  10/12/2020   NO PAST SURGERIES     PATENT FORAMEN OVALE(PFO) CLOSURE N/A 12/10/2020   Procedure: PATENT FORAMEN OVALE (PFO)  CLOSURE;  Surgeon: Ladona Heinz, MD;  Location: [facility];  Service: Cardiovascular;  Laterality: N/A;   PATENT FORAMEN OVALE(PFO) CLOSURE N/A 06/16/2023   Procedure: PATENT FORAMEN OVALE(PFO) CLOSURE;  Surgeon: Ladona Heinz, MD;  Location: [facility];  Service: Cardiovascular;  Laterality: N/A;   RADIOLOGY WITH ANESTHESIA N/A 10/12/2020   Procedure: RADIOLOGY WITH ANESTHESIA;  Surgeon: Radiologist, Medication, MD;  Location: [facility] OR;  Service: Radiology;  Laterality: N/A;   RADIOLOGY WITH ANESTHESIA N/A 10/11/2020   Procedure: IR WITH ANESTHESIA;  Surgeon: Radiologist, Medication, MD;  Location: [facility] OR;  Service: Radiology;  Laterality: N/A;   TEE WITHOUT CARDIOVERSION N/A 10/17/2020   Procedure: TRANSESOPHAGEAL ECHOCARDIOGRAM (TEE);  Surgeon: Jeffrie Oneil BROCKS, MD;  Location: [facility] ENDOSCOPY;  Service: Cardiovascular;  Laterality: N/A;   TRANSESOPHAGEAL ECHOCARDIOGRAM (CATH LAB) N/A 05/05/2023   Procedure: TRANSESOPHAGEAL ECHOCARDIOGRAM;  Surgeon: Mona Vinie BROCKS, MD;  Location: [facility];  Service: Cardiovascular;  Laterality: N/A;   Patient Active Problem List   Diagnosis Date Noted   Supervision of high risk pregnancy, antepartum 03/22/2023   Breast mass 09/09/2022   Depression/anxiety 04/22/2021   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   Cannabis use disorder, severe, dependence (HCC) daily 03/24/2021   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/24/2021   History of stroke x2 10/2020 12/16/2020   Depression, major, single episode, moderate (HCC) 12/16/2020   PFO (patent foramen ovale) 12/09/2020   Anemia    Hemiparesis affecting left side as late effect of stroke (HCC)    Leukocytosis    Dyslipidemia    Migraines  05/09/2019   LGSIL on Pap smear of cervix 08/18/2018   Generalized anxiety disorder 07/26/2017   ADD (attention deficit disorder) 07/07/2017   Maternal varicella, non-immune 08/01/2015    ONSET DATE: Stroke Sept 2022; L knee pain  started last May 2024  REFERRING DIAG: P30.645 (ICD-10-CM) - Hemiparesis affecting left side as late effect of stroke (HCC)  THERAPY DIAG:  Hemiparesis affecting left side as late effect of stroke (HCC)  Muscle weakness (generalized)  Other lack of coordination  Other symptoms and signs involving the musculoskeletal system  Other abnormalities of gait and mobility  Rationale for Evaluation and Treatment: Rehabilitation  SUBJECTIVE:                                                                                                                                                                                             SUBJECTIVE STATEMENT: She discussed bruising from injections w/ OBGYN and reports this is normal and she feels much better about her injections.  She denies pain today or acute changes.  She denies falls.  She ambulates from OT table to mat table in gym independently.  She really enjoyed her last pool session.  Her left calf is sometimes sore for about 12-24 hours after doing her exercises.  She is wearing bilateral knee braces today and these have helped her left knee a lot. Pt accompanied by: self  PERTINENT HISTORY:  Per [name], MD note 11/05/23: Hx R MCA infarct in 2022, patient completed both inpatient and outpatient rehabilitation at that time had very good outcome returning to work.  Despite that she did have some persistent left hand incoordination and mild left lower extremity weakness.   No longer working at [employer], she tried working at another job but this had longer hours.  She then became pregnant and now no longer works.  She does walk about 7000 steps a day but does no other exercise   The patient has had no falls or injuries.  Her left wrist feels okay her right wrist has dorsal pain that she notes mainly when she is driving.  She has no numbness or tingling in the fingers at night or while she is driving.  She has noted no swelling in the  wrist joint.  No elbow pain. Left knee  pain is relieved by an elastic knee sleeve.  She states that it is mainly in the front of the knee rather than behind the knee.  She has not noted swelling of her knee.  No numbness tingling shooting down the left lower extremity from her back.  PMH includes asthma, migraine HA, R MCA stroke (h/o 2 ischemic strokes in Sept 2022--residual weakness to L face, arm, and leg), PFO closure.  Not currently on anti-coagulation.  PAIN:  Are you having pain? Yes: NPRS scale: 0/10 Pain location: L knee Pain description: fatigue Aggravating factors: working/activity Relieving factors: elevation  PRECAUTIONS: Other: Pregnancy (No specific precautions due to pregnancy per CNM 11/11/23); Per CNM 11/19/23: Recommend against hot tubs or temps over 100F.  Pt reporting starting anti-coagulation 11/23/23.  RED FLAGS: None   WEIGHT BEARING RESTRICTIONS: No  FALLS: Has patient fallen in last 6 months? No  LIVING ENVIRONMENT: Lives with: lives with their family (pt's mother), lives with their partner, and lives with their son (21 y.o.) Lives in: House/apartment Stairs: Yes: External: 3 steps; none Has following equipment at home: shower chair  PLOF: Independent and Needs assistance with ADLs; boyfriend helps tie shoes or assists with fine motor activities with L hand; (+) driving; currently not working  PATIENT GOALS: To not have to use L knee brace; improve L knee pain.  Improve balance (in/out of shower) and L LE strength.  OBJECTIVE:  Note: Objective measures were completed at Evaluation unless otherwise noted.  DIAGNOSTIC FINDINGS:  CT Head 08/29/23: 1. No evidence of acute intracranial abnormality. 2. Chronic right MCA infarct. 3. Right sphenoid sinus fluid, correlate for acute sinusitis.  COGNITION: Overall cognitive status: Within functional limits for tasks assessed   SENSATION: Light touch: WFL B LE's  COORDINATION: L LE fatigues with decreased  quality with rapid alternating toe taps (in sitting)  EDEMA:  WNL B LE's  MUSCLE TONE: Catch noted with quick stretch to L ankle into DF (normal R)  POSTURE: No Significant postural limitations  LOWER EXTREMITY ROM:     Active  Right Eval Left Eval  Hip flexion WNL WNL  Hip extension    Hip abduction    Hip adduction    Hip internal rotation    Hip external rotation    Knee flexion WNL WNL  Knee extension WNL WNL  Ankle dorsiflexion WNL WNL  Ankle plantarflexion WNL WNL  Ankle inversion    Ankle eversion     (Blank rows = not tested)  LOWER EXTREMITY MMT:    MMT Right Eval Left Eval  Hip flexion 4+/5 4+/5  Hip extension    Hip abduction    Hip adduction    Hip internal rotation    Hip external rotation    Knee flexion 4+/5 4+/5  Knee extension 4+/5 4+/5  Ankle dorsiflexion 4+/5 4+/5  Ankle plantarflexion    Ankle inversion    Ankle eversion    (Blank rows = not tested)  BED MOBILITY:  Reports being independent but sometimes takes extra time if gets dizzy (dizziness goes away pretty quick)  TRANSFERS: Sit to stand: Complete Independence  Assistive device utilized: None     Stand to sit: Complete Independence  Assistive device utilized: None      STAIRS: Not tested GAIT: Findings: Gait Characteristics: step through pattern and decreased ankle dorsiflexion- Left, Distance walked: clinic distances, Assistive device utilized:None, Level of assistance: Complete Independence, and Comments: pt reports L LE feeling weak during ambulation  FUNCTIONAL TESTS:  5  times sit to stand: 19.37 seconds; no UE support FGA: 21/30 (11/16/23)  FUNCTIONAL GAIT ASSESSMENT  Date: 11/16/23 Score  GAIT LEVEL SURFACE Instructions: Walk at your normal speed from here to the next mark (6 m) [20 ft]. (1) Moderate impairment-Walks 6 m (20 ft), slow speed, abnormal gait pattern, evidence for imbalance, or deviates 25.4 - 38.1 cm (10 -15 in) outside of the 30.48-cm (12-in) walkway  width. Requires more than 7 seconds to ambulate 6 m (20 ft). 7.97 seconds  2.   CHANGE IN GAIT SPEED Instructions: Begin walking at your normal pace (for 1.5 m [5 ft]). When I tell you "go," walk as fast as you can (for 1.5 m [5 ft]). When I tell you "slow," walk as slowly as you can (for 1.5 m [5 ft]. (3) Normal - Able to smoothly change walking speed without loss of balance or gait deviation. Shows a significant difference in walking speeds between normal, fast, and slow speeds. Deviates no more than 15.24 cm (6 in) outside of the 30.48-cm (12-in) walkway width.  3.    GAIT WITH HORIZONTAL HEAD TURNS Instructions: Walk from here to the next mark 6 m (20 ft) away. Begin walking at your normal pace. Keep walking straight; after 3 steps, turn your head to the right and keep walking straight while looking to the right. After 3 more steps, turn your head to the left and keep walking straight while looking left. Continue alternating looking right and left. (3) Normal - Performs head turns smoothly with no change in gait. Deviates no more than 15.24 cm (6 in) outside 30.48-cm (12-in) walkway width.  4.   GAIT WITH VERTICAL HEAD TURNS Instructions: Walk from here to the next mark (6 m [20 ft]). Begin walking at your normal pace. Keep walking straight; after 3 steps, tip your head up and keep walking straight while looking up. After 3 more steps, tip your head down, keep walking straight while looking down. Continue  alternating looking up and down every 3 steps until you have completed 2 repetitions in each direction. (2) Mild impairment - Performs task with slight change in gait velocity (eg, minor disruption to smooth gait path), deviates 15.24 -25.4 cm (6 -10 in) outside 30.48-cm (12-in) walkway width or uses assistive device.  5.  GAIT AND PIVOT TURN Instructions: Begin with walking at your normal pace. When I tell you, "turn and stop," turn as quickly as you can to face the opposite direction and stop. (3)  Normal - Pivot turns safely within 3 seconds and stops quickly with no loss of balance  6.   STEP OVER OBSTACLE Instructions: Begin walking at your normal speed. When you come to the shoe box, step over it, not around it, and keep walking. (2) Mild impairment - Is able to step over one shoe box (11.43 cm [4.5 in] total height) without changing gait speed; no evidence of imbalance.  7.   GAIT WITH NARROW BASE OF SUPPORT Instructions: Walk on the floor with arms folded across the chest, feet aligned heel to toe in tandem for a distance of 3.6 m [12 ft]. The number of steps taken in a straight line are counted for a maximum of 10 steps. (2) Mild impairment - Ambulates 7-9 steps  8.   GAIT WITH EYES CLOSED Instructions: Walk at your normal speed from here to the next mark (6 m [20 ft]) with your eyes closed. (1) Moderate impairment - Walks 6 m (20 ft), slow speed, abnormal  gait pattern, evidence for imbalance, deviates 25.4 -38.1 cm (10 -15 in) outside 30.48-cm (12-in) walkway width. Requires more than 9 seconds to ambulate 6 m (20 ft).12.94 seconds  9.   AMBULATING BACKWARDS Instructions: Walk backwards until I tell you to stop (2) Mild impairment - Walks 6 m (20 ft), uses assistive device, slower speed, mild gait deviations, deviates 15.24 -25.4 cm (6 -10 in) outside 30.48-cm (12-in) walkway width 21.69 seconds  10. STEPS Instructions: Walk up these stairs as you would at home (ie, using the rail if necessary). At the top turn around and walk down. (2) Mild impairment-Alternating feet, must use rail.  Total 21/30   Interpretation of scores: Non-Specific Older Adults Cutoff Score: <=22/30 = risk of falls Parkinson's Disease Cutoff score <15/30= fall risk (Hoehn & Yahr 1-4)  Minimally Clinically Important Difference (MCID)  Stroke (acute, subacute, and chronic) = MDC: 4.2 points Vestibular (acute) = MDC: 6 points Community Dwelling Older Adults =  MCID: 4 points Parkinson's Disease  =  MDC: 4.3  points  (Academy of Neurologic Physical Therapy (nd). Functional Gait Assessment. Retrieved from https://www.neuropt.org/docs/default-source/cpgs/core-outcome-measures/function-gait-assessment-pocket-guide-proof9-(2).pdf?sfvrsn=b48f35043_0.)  PATIENT SURVEYS:  LEFS: 35/80; 43.75% (11/16/23)  Extreme difficulty/unable (0), Quite a bit of difficulty (1), Moderate difficulty (2), Little difficulty (3), No difficulty (4) Survey date:  11/16/23  Any of your usual work, housework or school activities 1  2. Usual hobbies, recreational or sporting activities 2  3. Getting into/out of the bath 1  4. Walking between rooms 4  5. Putting on socks/shoes 1  6. Squatting  2  7. Lifting an object, like a bag of groceries from the floor 4  8. Performing light activities around your home 3  9. Performing heavy activities around your home 1  10. Getting into/out of a car 4  11. Walking 2 blocks 3  12. Walking 1 mile 1  13. Going up/down 10 stairs (1 flight) 3  14. Standing for 1 hour 0  15.  sitting for 1 hour 3  16. Running on even ground 0  17. Running on uneven ground 0  18. Making sharp turns while running fast 0  19. Hopping  0  20. Rolling over in bed 2 (d/t pregnancy)  Score total:  35/80      Vitals:  Beginning of session: There were no vitals filed for this visit.  End of session: BP R UE sitting 104/57 with HR 61 bpm                                                                                                                              TREATMENT DATE: 11/30/23 -SciFit x8 minutes in multi-peaks mode using BUE/BLE support up to level 5.0 for global endurance challenge, large amplitude reciprocal mobility, and LE strengthening. -Side-lying clamshells x15 each side, left hip decreased ROM -Side-lying reverse clamshells x15 each side, demonstrated various progressions -SL bridge x10 each LE -Quadruped birddog x10 each side, multimodal cues to improve  core activation - discussed  countertop version on HEP -Side plank at counter 2x30 sec each side, PT facilitates improved alignment to deepen core engagement - discussed progressions/regressions for UE stability  PATIENT EDUCATION: Education details: Continue walking program and HEP w/ additions today.  Wear tennis shoes to next land appt! Person educated: Patient Education method: Explanation Education comprehension: verbalized understanding  HOME EXERCISE PROGRAM: 11/18/23: You Can Walk For A Certain Length Of Time Each Day                          Walk 10 minutes 1-2 times per day.             Increase 3-4  minutes every 7-14 days              Work up to 20-30 minutes (1-2 times per day).               Example:                         Day 1-2           4-5 minutes     3 times per day                         Day 7-8           10-12 minutes 2-3 times per day                         Day 13-14       20-22 minutes 1-2 times per day  Access Code: [email or web address] URL: [email or web address] Date: 11/30/2023 Prepared by: Daved Bull  Exercises - Standing March with Counter Support  - 1 x daily - 5 x weekly - 1-2 sets - 10 reps - Heel Raises with Counter Support  - 1 x daily - 5 x weekly - 1-2 sets - 10 reps - Sidelying Reverse Clamshell  - 1 x daily - 5 x weekly - 1-2 sets - 15 reps - Single Leg Bridge  - 1 x daily - 5 x weekly - 2 sets - 10 reps - Bird Dog  - 1 x daily - 5 x weekly - 1-2 sets - 10 reps - Bird Dog on Counter  - 1 x daily - 5 x weekly - 1-2 sets - 10 reps - Full Side Plank on Counter  - 1 x daily - 5 x weekly - 1 sets - 2-3 reps - 30 seconds hold - Clamshell  - 1 x daily - 5 x weekly - 1-2 sets - 15 reps  GOALS: Goals reviewed with patient? Yes  SHORT TERM GOALS: Equals long term goals based on length of POC  LONG TERM GOALS: Target date: 12/10/2023  Pt will be independent with final HEP in order to improve strength and balance in order to decrease fall risk and improve function  at home for ADL's.  Baseline: To be issued (not issued d/t time) Goal status: INITIAL  2.  Pt will decrease 5 Time Sit to Stand by at least 3 seconds in order to demonstrate clinically significant improvement in LE strength.  Baseline: 19.37 seconds (Eval) Goal status: INITIAL  3.  Assess FGA and update FGA goal based on results. Baseline: 21/30 (11/16/23) Goal status: INITIAL  4.  Pt will increase LEFS by at least 9 points in order  to demonstrate significant improvement in lower extremity function.   Baseline: 35/80 (11/16/23) Goal status: INITIAL  5. Patient will be able to perform household work/chores without increase in L knee symptoms.  Baseline: L knee pain increases with activities.  Goal status: INITIAL    ASSESSMENT:  CLINICAL IMPRESSION: Patient seen for follow-up land appt today.  Focus of skilled session on endurance and hip strengthening.  Time spent expanding HEP to improve hip stability.  She was most challenged by clamshells and birddogs and would benefit from further core work in future sessions.  Will address foot-up next land visit.  Continue per POC.  OBJECTIVE IMPAIRMENTS: Abnormal gait, decreased activity tolerance, decreased balance, decreased coordination, decreased endurance, decreased mobility, decreased strength, and pain.   ACTIVITY LIMITATIONS: carrying, lifting, bending, standing, squatting, stairs, locomotion level, and caring for others  PARTICIPATION LIMITATIONS: cleaning, shopping, community activity, occupation, yard work, and church  PERSONAL FACTORS: Past/current experiences, Time since onset of injury/illness/exacerbation, and 3+ comorbidities: R MCA stroke, migraine HA, L knee pain, R wrist pain are also affecting patient's functional outcome.   REHAB POTENTIAL: Good  CLINICAL DECISION MAKING: Evolving/moderate complexity  EVALUATION COMPLEXITY: Moderate  PLAN:  PT FREQUENCY: 2x/week  PT DURATION: 4 weeks  PLANNED INTERVENTIONS:  [id number]- PT Re-evaluation, 97750- Physical Performance Testing, 97110-Therapeutic exercises, 97530- Therapeutic activity, [id number]- Neuromuscular re-education, 97535- Self Care, [id number]- Manual therapy, [id number]- Gait training, [id number]- Orthotic Initial, [id number]- Orthotic/Prosthetic subsequent, [phone]- Canalith repositioning, [phone]- Aquatic Therapy, Patient/Family education, Balance training, Stair training, Taping, Vestibular training, DME instructions, Cryotherapy, and Moist heat  PLAN FOR NEXT SESSION: Sci-Fit; L LE strengthening; pain control; balance; progress HEP; walking program; follow-up on aquatic appts; Foot-up brace?  Try hip circles and openers at ballet bar/counter to see if pt has relief on land as well!  Leg press?   Daved KATHEE Bull, PT, DPT 11/30/2023, 1:06 PM

## 2023-12-01 DIAGNOSIS — F329 Major depressive disorder, single episode, unspecified: Secondary | ICD-10-CM | POA: Insufficient documentation

## 2023-12-01 NOTE — Assessment & Plan Note (Signed)
 Increase Effexor  to 75mg  daily, hydroxyzine  25mg  po qid prn for insomnia & anxiety. Referrals to [facility] for women's mood disorder placed.

## 2023-12-01 NOTE — Assessment & Plan Note (Signed)
 Red flag symptoms reviewed. MFM appointment Friday 10/31. Continue Lovenox, consider applying ice to site prior to injection.

## 2023-12-02 ENCOUNTER — Ambulatory Visit: Admitting: Physical Therapy

## 2023-12-02 ENCOUNTER — Ambulatory Visit

## 2023-12-02 ENCOUNTER — Encounter: Payer: Self-pay | Admitting: Physical Therapy

## 2023-12-02 DIAGNOSIS — R29898 Other symptoms and signs involving the musculoskeletal system: Secondary | ICD-10-CM

## 2023-12-02 DIAGNOSIS — R2689 Other abnormalities of gait and mobility: Secondary | ICD-10-CM

## 2023-12-02 DIAGNOSIS — M6281 Muscle weakness (generalized): Secondary | ICD-10-CM

## 2023-12-02 DIAGNOSIS — R278 Other lack of coordination: Secondary | ICD-10-CM

## 2023-12-02 DIAGNOSIS — I69354 Hemiplegia and hemiparesis following cerebral infarction affecting left non-dominant side: Secondary | ICD-10-CM

## 2023-12-02 DIAGNOSIS — R41840 Attention and concentration deficit: Secondary | ICD-10-CM

## 2023-12-02 DIAGNOSIS — M25531 Pain in right wrist: Secondary | ICD-10-CM

## 2023-12-02 NOTE — Therapy (Signed)
 OUTPATIENT PHYSICAL THERAPY NEURO TREATMENT   Patient Name: LANIE SCHELLING MRN: [id number] DOB:[birth year], 28 y.o., female Today's Date: 12/02/2023   PCP: No PCP REFERRING PROVIDER: [name], MD  END OF SESSION:  PT End of Session - 12/02/23 1102     Visit Number 7    Number of Visits 9   8 plus Eval   Date for Recertification  12/17/23   For scheduling delays   Authorization Type  MEDICAID [facility]    Authorization Time Period 10/14-11/22/25    Authorization - Visit Number 7    Authorization - Number of Visits 8    PT Start Time 1057    PT Stop Time 1141    PT Time Calculation (min) 44 min    Equipment Utilized During Treatment Other (comment)   aquatic devices as needed for safety and challenge   Activity Tolerance Patient tolerated treatment well;Patient limited by fatigue    Behavior During Therapy [facility] for tasks assessed/performed          Past Medical History:  Diagnosis Date   Anxiety    Asthma    no treatments required at this time   Chlamydia 2023   Depression    Gonorrhea 2024   Migraine headache    Missed abortion with fetal demise before 20 completed weeks of gestation 04/12/2023   Nicotine vapor product user 10/06/2019   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   Stroke (HCC)    hx 2 ischemic strokesin September 2022--residual weakness to L face, arm and leg   Past Surgical History:  Procedure Laterality Date   BUBBLE STUDY  10/17/2020   Procedure: BUBBLE STUDY;  Surgeon: Jeffrie Oneil BROCKS, MD;  Location: [facility] ENDOSCOPY;  Service: Cardiovascular;;   IR ANGIO INTRA EXTRACRAN SEL COM CAROTID INNOMINATE UNI L MOD SED  10/11/2020   IR ANGIO VERTEBRAL SEL VERTEBRAL UNI L MOD SED  10/11/2020   IR CT HEAD LTD  10/11/2020   IR CT HEAD LTD  10/12/2020   IR PERCUTANEOUS ART THROMBECTOMY/INFUSION INTRACRANIAL INC DIAG ANGIO  10/11/2020   IR PERCUTANEOUS ART THROMBECTOMY/INFUSION INTRACRANIAL INC DIAG ANGIO  10/12/2020   NO PAST SURGERIES     PATENT  FORAMEN OVALE(PFO) CLOSURE N/A 12/10/2020   Procedure: PATENT FORAMEN OVALE (PFO) CLOSURE;  Surgeon: Ladona Heinz, MD;  Location: [facility] INVASIVE CV LAB;  Service: Cardiovascular;  Laterality: N/A;   PATENT FORAMEN OVALE(PFO) CLOSURE N/A 06/16/2023   Procedure: PATENT FORAMEN OVALE(PFO) CLOSURE;  Surgeon: Ladona Heinz, MD;  Location: [facility] IN[facility] CV [facility];  Service: Cardiovascular;  Laterality: N/A;   RADIOLOGY WITH ANESTHESIA N/A 10/12/2020   Procedure: RADIOLOGY WITH ANESTHESIA;  Surgeon: Radiologist, Medication, MD;  Location: [facility] OR;  Service: Radiology;  Laterality: N/A;   RADIOLOGY WITH ANESTHESIA N/A 10/11/2020   Procedure: IR WITH ANESTHESIA;  Surgeon: Radiologist, Medication, MD;  Location: [facility] OR;  Service: Radiology;  Laterality: N/A;   TEE WITHOUT CARDIOVERSION N/A 10/17/2020   Procedure: TRANSESOPHAGEAL ECHOCARDIOGRAM (TEE);  Surgeon: Jeffrie Oneil BROCKS, MD;  Location: [facility] ENDOSCOPY;  Service: Cardiovascular;  Laterality: N/A;   TRANSESOPHAGEAL ECHOCARDIOGRAM (CATH LAB) N/A 05/05/2023   Procedure: TRANSESOPHAGEAL ECHOCARDIOGRAM;  Surgeon: Mona Vinie BROCKS, MD;  Location: [facility] INVAS[facility] CV LAB;  Service: Cardiovascular;  Laterality: N/A;   Patient Active Problem List   Diagnosis Date Noted   Major depressive disorder affecting pregnancy 12/01/2023   Supervision of high risk pregnancy, antepartum 03/22/2023   Breast mass 09/09/2022   Depression/anxiety 04/22/2021   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   Cannabis use disorder, severe, dependence (HCC) daily 03/24/2021   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   History of stroke x2 10/2020 12/16/2020   Depression, major, single episode, moderate (HCC) 12/16/2020   PFO (patent foramen ovale) 12/09/2020   Anemia    Hemiparesis affecting left side as late effect of stroke (HCC)    Leukocytosis    Dyslipidemia    Migraines  05/09/2019   LGSIL on Pap smear of cervix 08/18/2018   Generalized anxiety disorder 07/26/2017    ADD (attention deficit disorder) 07/07/2017   Maternal varicella, non-immune 08/01/2015    ONSET DATE: Stroke Sept 2022; L knee pain started last May 2024  REFERRING DIAG: P30.645 (ICD-10-CM) - Hemiparesis affecting left side as late effect of stroke (HCC)  THERAPY DIAG:  Muscle weakness (generalized)  Other lack of coordination  Other symptoms and signs involving the musculoskeletal system  Other abnormalities of gait and mobility  Rationale for Evaluation and Treatment: Rehabilitation  SUBJECTIVE:                                                                                                                                                                                             SUBJECTIVE STATEMENT: She denies falls or pain, but reports ongoing fatigue.  She has left calf soreness - no tenderness to touch, no heat, no redness visible today and no loss of function.  She reports she has had to take a day off after therapy due to fatigue and inquires if this is okay - PT encourages her to rest as needed and monitor symptoms always to ensure her daily activities can still get done, but to prioritize low level tasks like walking on days where she has more energy between therapy sessions. Pt accompanied by: self  PERTINENT HISTORY:  Per [name], MD note 11/05/23: Hx R MCA infarct in 2022, patient completed both inpatient and outpatient rehabilitation at that time had very good outcome returning to work.  Despite that she did have some persistent left hand incoordination and mild left lower extremity weakness.   No longer working at [employer], she tried working at another job but this had longer hours.  She then became pregnant and now no longer works.  She does walk about 7000 steps a day but does no other exercise   The patient has had no falls or injuries.  Her left wrist feels okay her right wrist has dorsal pain that she notes mainly when she is driving.  She has  no numbness or tingling in the fingers at night or while she is  driving.  She has noted no swelling in the wrist joint.  No elbow pain. Left knee pain is relieved by an elastic knee sleeve.  She states that it is mainly in the front of the knee rather than behind the knee.  She has not noted swelling of her knee.  No numbness tingling shooting down the left lower extremity from her back.  PMH includes asthma, migraine HA, R MCA stroke (h/o 2 ischemic strokes in Sept 2022--residual weakness to L face, arm, and leg), PFO closure.  Not currently on anti-coagulation.  PAIN:  Are you having pain? Yes: NPRS scale: 0/10 Pain location: L knee Pain description: fatigue Aggravating factors: working/activity Relieving factors: elevation  PRECAUTIONS: Other: Pregnancy (No specific precautions due to pregnancy per CNM 11/11/23); Per CNM 11/19/23: Recommend against hot tubs or temps over 100F.  Pt reporting starting anti-coagulation 11/23/23.  RED FLAGS: None   WEIGHT BEARING RESTRICTIONS: No  FALLS: Has patient fallen in last 6 months? No  LIVING ENVIRONMENT: Lives with: lives with their family (pt's mother), lives with their partner, and lives with their son (84 y.o.) Lives in: House/apartment Stairs: Yes: External: 3 steps; none Has following equipment at home: shower chair  PLOF: Independent and Needs assistance with ADLs; boyfriend helps tie shoes or assists with fine motor activities with L hand; (+) driving; currently not working  PATIENT GOALS: To not have to use L knee brace; improve L knee pain.  Improve balance (in/out of shower) and L LE strength.  OBJECTIVE:  Note: Objective measures were completed at Evaluation unless otherwise noted.  DIAGNOSTIC FINDINGS:  CT Head 08/29/23: 1. No evidence of acute intracranial abnormality. 2. Chronic right MCA infarct. 3. Right sphenoid sinus fluid, correlate for acute sinusitis.  COGNITION: Overall cognitive status: Within functional limits  for tasks assessed   SENSATION: Light touch: WFL B LE's  COORDINATION: L LE fatigues with decreased quality with rapid alternating toe taps (in sitting)  EDEMA:  WNL B LE's  MUSCLE TONE: Catch noted with quick stretch to L ankle into DF (normal R)  POSTURE: No Significant postural limitations  LOWER EXTREMITY ROM:     Active  Right Eval Left Eval  Hip flexion WNL WNL  Hip extension    Hip abduction    Hip adduction    Hip internal rotation    Hip external rotation    Knee flexion WNL WNL  Knee extension WNL WNL  Ankle dorsiflexion WNL WNL  Ankle plantarflexion WNL WNL  Ankle inversion    Ankle eversion     (Blank rows = not tested)  LOWER EXTREMITY MMT:    MMT Right Eval Left Eval  Hip flexion 4+/5 4+/5  Hip extension    Hip abduction    Hip adduction    Hip internal rotation    Hip external rotation    Knee flexion 4+/5 4+/5  Knee extension 4+/5 4+/5  Ankle dorsiflexion 4+/5 4+/5  Ankle plantarflexion    Ankle inversion    Ankle eversion    (Blank rows = not tested)  BED MOBILITY:  Reports being independent but sometimes takes extra time if gets dizzy (dizziness goes away pretty quick)  TRANSFERS: Sit to stand: Complete Independence  Assistive device utilized: None     Stand to sit: Complete Independence  Assistive device utilized: None      STAIRS: Not tested GAIT: Findings: Gait Characteristics: step through pattern and decreased ankle dorsiflexion- Left, Distance walked: clinic distances, Assistive device utilized:None, Level of assistance:  Complete Independence, and Comments: pt reports L LE feeling weak during ambulation  FUNCTIONAL TESTS:  5 times sit to stand: 19.37 seconds; no UE support FGA: 21/30 (11/16/23)  FUNCTIONAL GAIT ASSESSMENT  Date: 11/16/23 Score  GAIT LEVEL SURFACE Instructions: Walk at your normal speed from here to the next mark (6 m) [20 ft]. (1) Moderate impairment-Walks 6 m (20 ft), slow speed, abnormal gait pattern,  evidence for imbalance, or deviates 25.4 - 38.1 cm (10 -15 in) outside of the 30.48-cm (12-in) walkway width. Requires more than 7 seconds to ambulate 6 m (20 ft). 7.97 seconds  2.   CHANGE IN GAIT SPEED Instructions: Begin walking at your normal pace (for 1.5 m [5 ft]). When I tell you "go," walk as fast as you can (for 1.5 m [5 ft]). When I tell you "slow," walk as slowly as you can (for 1.5 m [5 ft]. (3) Normal - Able to smoothly change walking speed without loss of balance or gait deviation. Shows a significant difference in walking speeds between normal, fast, and slow speeds. Deviates no more than 15.24 cm (6 in) outside of the 30.48-cm (12-in) walkway width.  3.    GAIT WITH HORIZONTAL HEAD TURNS Instructions: Walk from here to the next mark 6 m (20 ft) away. Begin walking at your normal pace. Keep walking straight; after 3 steps, turn your head to the right and keep walking straight while looking to the right. After 3 more steps, turn your head to the left and keep walking straight while looking left. Continue alternating looking right and left. (3) Normal - Performs head turns smoothly with no change in gait. Deviates no more than 15.24 cm (6 in) outside 30.48-cm (12-in) walkway width.  4.   GAIT WITH VERTICAL HEAD TURNS Instructions: Walk from here to the next mark (6 m [20 ft]). Begin walking at your normal pace. Keep walking straight; after 3 steps, tip your head up and keep walking straight while looking up. After 3 more steps, tip your head down, keep walking straight while looking down. Continue  alternating looking up and down every 3 steps until you have completed 2 repetitions in each direction. (2) Mild impairment - Performs task with slight change in gait velocity (eg, minor disruption to smooth gait path), deviates 15.24 -25.4 cm (6 -10 in) outside 30.48-cm (12-in) walkway width or uses assistive device.  5.  GAIT AND PIVOT TURN Instructions: Begin with walking at your normal pace. When  I tell you, "turn and stop," turn as quickly as you can to face the opposite direction and stop. (3) Normal - Pivot turns safely within 3 seconds and stops quickly with no loss of balance  6.   STEP OVER OBSTACLE Instructions: Begin walking at your normal speed. When you come to the shoe box, step over it, not around it, and keep walking. (2) Mild impairment - Is able to step over one shoe box (11.43 cm [4.5 in] total height) without changing gait speed; no evidence of imbalance.  7.   GAIT WITH NARROW BASE OF SUPPORT Instructions: Walk on the floor with arms folded across the chest, feet aligned heel to toe in tandem for a distance of 3.6 m [12 ft]. The number of steps taken in a straight line are counted for a maximum of 10 steps. (2) Mild impairment - Ambulates 7-9 steps  8.   GAIT WITH EYES CLOSED Instructions: Walk at your normal speed from here to the next mark (6 m [20  ft]) with your eyes closed. (1) Moderate impairment - Walks 6 m (20 ft), slow speed, abnormal gait pattern, evidence for imbalance, deviates 25.4 -38.1 cm (10 -15 in) outside 30.48-cm (12-in) walkway width. Requires more than 9 seconds to ambulate 6 m (20 ft).12.94 seconds  9.   AMBULATING BACKWARDS Instructions: Walk backwards until I tell you to stop (2) Mild impairment - Walks 6 m (20 ft), uses assistive device, slower speed, mild gait deviations, deviates 15.24 -25.4 cm (6 -10 in) outside 30.48-cm (12-in) walkway width 21.69 seconds  10. STEPS Instructions: Walk up these stairs as you would at home (ie, using the rail if necessary). At the top turn around and walk down. (2) Mild impairment-Alternating feet, must use rail.  Total 21/30   Interpretation of scores: Non-Specific Older Adults Cutoff Score: <=22/30 = risk of falls Parkinson's Disease Cutoff score <15/30= fall risk (Hoehn & Yahr 1-4)  Minimally Clinically Important Difference (MCID)  Stroke (acute, subacute, and chronic) = MDC: 4.2 points Vestibular (acute) =  MDC: 6 points Community Dwelling Older Adults =  MCID: 4 points Parkinson's Disease  =  MDC: 4.3 points  (Academy of Neurologic Physical Therapy (nd). Functional Gait Assessment. Retrieved from https://www.neuropt.org/docs/default-source/cpgs/core-outcome-measures/function-gait-assessment-pocket-guide-proof9-(2).pdf?sfvrsn=b32f35043_0.)  PATIENT SURVEYS:  LEFS: 35/80; 43.75% (11/16/23)  Extreme difficulty/unable (0), Quite a bit of difficulty (1), Moderate difficulty (2), Little difficulty (3), No difficulty (4) Survey date:  11/16/23  Any of your usual work, housework or school activities 1  2. Usual hobbies, recreational or sporting activities 2  3. Getting into/out of the bath 1  4. Walking between rooms 4  5. Putting on socks/shoes 1  6. Squatting  2  7. Lifting an object, like a bag of groceries from the floor 4  8. Performing light activities around your home 3  9. Performing heavy activities around your home 1  10. Getting into/out of a car 4  11. Walking 2 blocks 3  12. Walking 1 mile 1  13. Going up/down 10 stairs (1 flight) 3  14. Standing for 1 hour 0  15.  sitting for 1 hour 3  16. Running on even ground 0  17. Running on uneven ground 0  18. Making sharp turns while running fast 0  19. Hopping  0  20. Rolling over in bed 2 (d/t pregnancy)  Score total:  35/80      Vitals:  Beginning of session: There were no vitals filed for this visit.  End of session: BP R UE sitting 104/57 with HR 61 bpm                                                                                                                              TREATMENT DATE: 12/02/23 Aquatic therapy at [facility] - pool temperature 92 degrees   Patient seen for aquatic therapy today.  Treatment took place in water 3.6-4.8 feet deep depending upon activity.  Patient entered and exited the pool via stairs using  reciprocal gait and bilateral rails at mod I level.   Exercises: Water walking warmup: 4x18 ft  forward > backward > laterally unsupported -Walking hip openers 2x18 ft -Standing hip circles x10 CW > x10 CCW each LE -Tandem walking w/ fingertip support 4x20 ft along pool wall -Heel and toe raises each off end of step w/ BUE support x20 > step downs w/ BUE support x10 alt LE  Patient requires buoyancy of the water for support for reduced fall risk with gait training and balance exercises with verbal cues, return demo and SBA support. Exercises able to be performed safely in water without the risk of fall compared to those same exercises performed on land; viscosity of water needed for resistance for strengthening. Current of water provides perturbations for challenging static and dynamic balance.    PATIENT EDUCATION: Education details: Continue walking program and HEP w/ additions today.  Wear tennis shoes to next land appt!  Fatigue management and energy conservation (see subjective). Person educated: Patient Education method: Explanation Education comprehension: verbalized understanding  HOME EXERCISE PROGRAM: 11/18/23: You Can Walk For A Certain Length Of Time Each Day                          Walk 10 minutes 1-2 times per day.             Increase 3-4  minutes every 7-14 days              Work up to 20-30 minutes (1-2 times per day).               Example:                         Day 1-2           4-5 minutes     3 times per day                         Day 7-8           10-12 minutes 2-3 times per day                         Day 13-14       20-22 minutes 1-2 times per day  Access Code: [email or web address] URL: [email or web address] Date: 11/30/2023 Prepared by: Daved Bull  Exercises - Standing March with Counter Support  - 1 x daily - 5 x weekly - 1-2 sets - 10 reps - Heel Raises with Counter Support  - 1 x daily - 5 x weekly - 1-2 sets - 10 reps - Sidelying Reverse Clamshell  - 1 x daily - 5 x weekly - 1-2 sets - 15 reps - Single Leg Bridge  - 1 x daily - 5 x  weekly - 2 sets - 10 reps - Bird Dog  - 1 x daily - 5 x weekly - 1-2 sets - 10 reps - Bird Dog on Counter  - 1 x daily - 5 x weekly - 1-2 sets - 10 reps - Full Side Plank on Counter  - 1 x daily - 5 x weekly - 1 sets - 2-3 reps - 30 seconds hold - Clamshell  - 1 x daily - 5 x weekly - 1-2 sets - 15 reps  GOALS: Goals reviewed with patient? Yes  SHORT TERM GOALS: Equals long term goals based  on length of POC  LONG TERM GOALS: Target date: 12/10/2023  Pt will be independent with final HEP in order to improve strength and balance in order to decrease fall risk and improve function at home for ADL's.  Baseline: To be issued (not issued d/t time) Goal status: INITIAL  2.  Pt will decrease 5 Time Sit to Stand by at least 3 seconds in order to demonstrate clinically significant improvement in LE strength.  Baseline: 19.37 seconds (Eval) Goal status: INITIAL  3.  Assess FGA and update FGA goal based on results. Baseline: 21/30 (11/16/23) Goal status: INITIAL  4.  Pt will increase LEFS by at least 9 points in order to demonstrate significant improvement in lower extremity function.   Baseline: 35/80 (11/16/23) Goal status: INITIAL  5. Patient will be able to perform household work/chores without increase in L knee symptoms.  Baseline: L knee pain increases with activities.  Goal status: INITIAL    ASSESSMENT:  CLINICAL IMPRESSION: Patient seen for follow-up aquatic visit today.  She remains limited by fatigue though this has been impacting her more at home.  Today she required more seated recovery between tasks due to new onset low back pain that resolves w/ positional changes and seated breaks.  She continues to tolerate aquatic modality well and demonstrate more confidence of movement in this setting.  Focused on increased distal LE strength, low back comfort, and dynamic challenges today.  Will continue per POC w/ possible considerations for re-cert on land if appropriate and  maintaining some aquatic intervention as needed.  OBJECTIVE IMPAIRMENTS: Abnormal gait, decreased activity tolerance, decreased balance, decreased coordination, decreased endurance, decreased mobility, decreased strength, and pain.   ACTIVITY LIMITATIONS: carrying, lifting, bending, standing, squatting, stairs, locomotion level, and caring for others  PARTICIPATION LIMITATIONS: cleaning, shopping, community activity, occupation, yard work, and church  PERSONAL FACTORS: Past/current experiences, Time since onset of injury/illness/exacerbation, and 3+ comorbidities: R MCA stroke, migraine HA, L knee pain, R wrist pain are also affecting patient's functional outcome.   REHAB POTENTIAL: Good  CLINICAL DECISION MAKING: Evolving/moderate complexity  EVALUATION COMPLEXITY: Moderate  PLAN:  PT FREQUENCY: 2x/week  PT DURATION: 4 weeks  PLANNED INTERVENTIONS: [id number]- PT Re-evaluation, 97750- Physical Performance Testing, 97110-Therapeutic exercises, 97530- Therapeutic activity, [id number]- Neuromuscular re-education, 97535- Self Care, [id number]- Manual therapy, [id number]- Gait training, [id number]- Orthotic Initial, [id number]- Orthotic/Prosthetic subsequent, [phone]- Canalith repositioning, [id number]- Aquatic Therapy, Patient/Family education, Balance training, Stair training, Taping, Vestibular training, DME instructions, Cryotherapy, and Moist heat  PLAN FOR NEXT SESSION: Sci-Fit; L LE strengthening; pain control; balance; progress HEP; walking program; follow-up on aquatic appts; Foot-up brace?  Try hip circles and openers at ballet bar/counter to see if pt has relief on land as well!  Leg press?   Daved KATHEE Bull, PT, DPT 12/02/2023, 11:42 AM

## 2023-12-02 NOTE — Therapy (Signed)
 OUTPATIENT OCCUPATIONAL THERAPY NEURO TREATMENT  Patient Name: Kristen Grant MRN: [id number] DOB:09-08-1995, 28 y.o., female Today's Date: 12/02/2023  PCP: none  REFERRING PROVIDER: [name], MD  END OF SESSION:  OT End of Session - 12/02/23 1318     Visit Number 6    Number of Visits 13    Date for Recertification  01/10/24    Authorization Type [employer]    Authorization Time Period approved 12 visits from 10/14 - 02/01/24    Authorization - Visit Number 5    Authorization - Number of Visits 12    OT Start Time 1316    OT Stop Time 1354    OT Time Calculation (min) 38 min    Equipment Utilized During [employer], bolster, 1 lb dumbbell    Activity Tolerance Patient tolerated treatment well;Patient limited by fatigue    Behavior During Therapy WFL for tasks assessed/performed            Past Medical History:  Diagnosis Date   Anxiety    Asthma    no treatments required at this time   Chlamydia 2023   Depression    Gonorrhea 2024   Migraine headache    Missed abortion with fetal demise before 20 completed weeks of gestation 04/12/2023   Nicotine vapor product user 10/06/2019   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   Stroke (HCC)    hx 2 ischemic strokesin September 2022--residual weakness to L face, arm and leg   Past Surgical History:  Procedure Laterality Date   BUBBLE STUDY  10/17/2020   Procedure: BUBBLE STUDY;  Surgeon: Jeffrie Oneil BROCKS, MD;  Location: [facility];  Service: Cardiovascular;;   IR ANGIO INTRA EXTRACRAN SEL COM CAROTID INNOMINATE UNI L MOD SED  10/11/2020   IR ANGIO VERTEBRAL SEL VERTEBRAL UNI L MOD SED  10/11/2020   IR CT HEAD LTD  10/11/2020   IR CT HEAD LTD  10/12/2020   IR PERCUTANEOUS ART THROMBECTOMY/INFUSION INTRACRANIAL INC DIAG ANGIO  10/11/2020   IR PERCUTANEOUS ART THROMBECTOMY/INFUSION INTRACRANIAL INC DIAG ANGIO  10/12/2020   NO PAST SURGERIES     PATENT FORAMEN OVALE(PFO) CLOSURE N/A 12/10/2020    Procedure: PATENT FORAMEN OVALE (PFO) CLOSURE;  Surgeon: Ladona Heinz, MD;  Location: [facility];  Service: Cardiovascular;  Laterality: N/A;   PATENT FORAMEN OVALE(PFO) CLOSURE N/A 06/16/2023   Procedure: PATENT FORAMEN OVALE(PFO) CLOSURE;  Surgeon: Ladona Heinz, MD;  Location: [facility];  Service: Cardiovascular;  Laterality: N/A;   RADIOLOGY WITH ANESTHESIA N/A 10/12/2020   Procedure: RADIOLOGY WITH ANESTHESIA;  Surgeon: Radiologist, Medication, MD;  Location: [facility] OR;  Service: Radiology;  Laterality: N/A;   RADIOLOGY WITH ANESTHESIA N/A 10/11/2020   Procedure: IR WITH ANESTHESIA;  Surgeon: Radiologist, Medication, MD;  Location: [facility] OR;  Service: Radiology;  Laterality: N/A;   TEE WITHOUT CARDIOVERSION N/A 10/17/2020   Procedure: TRANSESOPHAGEAL ECHOCARDIOGRAM (TEE);  Surgeon: Jeffrie Oneil BROCKS, MD;  Location: [facility] ENDOS[facility];  Service: Cardiovascular;  Laterality: N/A;   TRANSESOPHAGEAL ECHOCARDIOGRAM (CATH LAB) N/A 05/05/2023   Procedure: TRANSESOPHAGEAL ECHOCARDIOGRAM;  Surgeon: Mona Vinie BROCKS, MD;  Location: [facility];  Service: Cardiovascular;  Laterality: N/A;   Patient Active Problem List   Diagnosis Date Noted   Major depressive disorder affecting pregnancy 12/01/2023   Supervision of high risk pregnancy, antepartum 03/22/2023   Breast mass 09/09/2022   Depression/anxiety 04/22/2021   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   Cannabis use disorder,  severe, dependence (HCC) daily 03/24/2021   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   History of stroke x2 10/2020 12/16/2020   Depression, major, single episode, moderate (HCC) 12/16/2020   PFO (patent foramen ovale) 12/09/2020   Anemia    Hemiparesis affecting left side as late effect of stroke (HCC)    Leukocytosis    Dyslipidemia    Migraines  05/09/2019   LGSIL on Pap smear of cervix 08/18/2018   Generalized anxiety disorder 07/26/2017   ADD (attention deficit disorder)  07/07/2017   Maternal varicella, non-immune 08/01/2015    ONSET DATE: 11/05/2023  REFERRING DIAG: History of right MCA distribution infarct with residual mild left hemiparesis and motor control issues Note:  RIght wrist pain no trauma as well as vestibular eval  THERAPY DIAG:  Other lack of coordination  Attention and concentration deficit  Hemiparesis affecting left side as late effect of stroke (HCC)  Pain in right wrist  Rationale for Evaluation and Treatment: Rehabilitation  SUBJECTIVE:   SUBJECTIVE STATEMENT: Pt reports fatigue post aquatic therapy Pt accompanied by: self  PERTINENT HISTORY: per MD note on 11/05/23: History of right MCA distribution infarct (2022) with residual mild left hemiparesis and motor control issues. Right wrist pain no evidence of trauma it is mainly in the dorsal area. She has a remote history of wrist fracture in 2008. She could certainly be developing some posttraumatic arthritis at this point though does not look like she has any joint tenderness or inflammation. Will send her to OT. Have advised wearing a Velcro wrist splint when driving  PRECAUTIONS: Other: pregnant - caution w/ modality use  WEIGHT BEARING RESTRICTIONS: No  PAIN:  Are you having pain? Rt wrist 0/10, intermittent w/ certain tasks aggravating it.   FALLS: Has patient fallen in last 6 months? No  LIVING ENVIRONMENT: Lives with: lives with their family (mom, boyfriend, 60 y.o. son)  Lives in: 1 level home Has following equipment at home: shower chair but doesn't use  PLOF: Independent with basic ADLs and stopped working since pregnant   PATIENT GOALS: decrease Rt wrist pain, use Lt hand more, braid hair and tie shoes better  OBJECTIVE:  Note: Objective measures were completed at Evaluation unless otherwise noted.  HAND DOMINANCE: Right  ADLs:  Eating: assist w/ cutting food Grooming: assist to wash hair, mod I for brushing teeth and brushing hair but can't braid  hair UB Dressing: mod I  LB Dressing: mod I - but usually gets help to tied shoes Toileting: independent Bathing: distant supervision Tub Shower transfers: distant supervision because pt gets dizzy. Assist to get out of tub if pt bathes vs shower. Equipment: has shower chair but doesn't use b/c it takes up too much room  IADLs: Shopping: pt orders online and then picks up  Light housekeeping: pt does laundry (boyfriend and mom does)  Meal Prep: boyfriend does mostly, but pt helps w/ side dishes - assist for pots Community mobility: drives [employer] Medication management: [employer] Financial management: boyfriend does Handwriting: no changes  MOBILITY STATUS: Independent    FUNCTIONAL OUTCOME MEASURES: Upper Extremity Functional Scale (UEFS): 48.75% functioning (51.25% deficit)   UPPER EXTREMITY ROM:  BUE AROM WFL's but difficulty w/ coordination Lt hand   UPPER EXTREMITY MMT:     MMT Right eval Left eval  Shoulder flexion 5 3+  Shoulder abduction 5 4  Shoulder adduction    Shoulder extension    Shoulder internal rotation 5 4  Shoulder external rotation 5 4  Middle  trapezius    Lower trapezius    Elbow flexion    Elbow extension    Wrist flexion    Wrist extension    Wrist ulnar deviation    Wrist radial deviation    Wrist pronation    Wrist supination    (Blank rows = not tested)  HAND FUNCTION: Grip strength: Right: 62.1 lbs; Left: 32.6 lbs  COORDINATION: 9 Hole Peg test: Right: 21.52 sec; Left: 37.83 sec Box and Blocks:  Left 35 blocks w/ fatigue  SENSATION: Light touch: WFL Hot/Cold: Impaired  and diminished  EDEMA: none   COGNITION: Overall cognitive status: Within functional limits for tasks assessed  VISION: Subjective report: no changes but does have dizziness Baseline vision: No visual deficits Visual history: none  VISION ASSESSMENT: Not tested   PERCEPTION: Not tested  PRAXIS: Not tested  OBSERVATIONS: new Rt wrist pain which pt reports  is due to overuse from not using Lt hand as much, pain mostly with driving, trying to hold pots/pans, and trying to hold/use cellphone. Decreased coordination Lt hand since stroke 2022 and some learned non use. Lt small finger abducts out and pt doesn't use as much however does demo in hand manipulation for checkers                                                                                                                             TREATMENT DATE: 12/02/23   Checked 9HPT test this date with L hand, pt scored 45 seconds. This is most likely d/t pt being fatigued post aquatic therapy.   Engaged pt in FM coordination activity with L hand to improve functional use and reduce strain on R hand, completing tip to tip pinch with small pegs and recreating pattern on pegboard.  Therapeutic exercise completed to fortify wrist strength in RUE. 1# dumbbell provided and with bolster pt completed 2x10 wrist extension with forearm pronated. Next completed 2x10 wrist flexion forearm supinated, and radial/ulnar deviation. Pt reported no pain with these. Instructed pt on how to complete these exercises at home with hand over edge of tabletop and use of water bottle to replace free weight if needed. Handout provided, edited HEP instructing pt to complete 1-2 sets of 10 of each exercise 4-5 days of the week. Pt verbalized understanding.  Finally engaged pt in FM coordination with L hand completing Jenga block stacking game.   PATIENT EDUCATION: Education details: SEE ABOVE Person educated: Patient Education method: Explanation Education comprehension: verbalized understanding  HOME EXERCISE PROGRAM: 11/16/23: Coordination and Wrist isometrics (ACCESS CODE [id number])  11/18/23: Putty for L hand with red putty [facility])   11/23/23: Wrist stretches (ACCESS CODE [id number]) 12/02/23: Wrist with lightweight dumbbell   GOALS: Goals reviewed with patient? Yes  SHORT TERM GOALS: Target date:  12/11/23  Independent with HEP for Lt hand coordination and grip strength Baseline: not yet addressed/issued Goal status: MET   2.  Independent with isometric wrist strengthening HEP for Rt  wrist  Baseline: not yet addressed/issued Goal status: IN PROGRESS  3.  Pt to verbalize understanding with task modifications and potential A/E to decrease pain Rt wrist and increase ease/independence and safety (possible wrist brace prn, modifications to steering wheel, cell phone attachment, cutting food, holding pot/pan, alternative shoelace options prn) Baseline: not yet addressed/issued Goal status: MET  4.  Pt to be independent with activities for Lt hand (m-CIMT activities) to promote Lt hand assist for tasks and to decrease learned non use  Baseline: not yet addressed/issued Goal status: IN PROGRESS  5.  Pt to verbalize understanding with safety considerations d/t sensory loss Lt hand Baseline: not yet addressed/issued Goal status: IN PROGRESS   LONG TERM GOALS: Target date: 01/10/24  Pt to report greater ease with tying shoes, holding cell phone, and using steering wheel w/ pain Rt wrist less than or equal to 4/10  Baseline: up to 8/10 Goal status: IN PROGRESS  2.  Pt to simulate changing diaper in prep for new infant care at mod I level safely Baseline: not yet addressed/issued Goal status: IN PROGRESS  3.  Pt to braid hair w/ task modifications and A/E prn  Baseline: dependent Goal status: IN PROGRESS  4.  Pt to improve coordination Lt hand as evidenced by reducing speed on 9 hole peg test to 33 sec or less Baseline: 37 sec Goal status: INITIAL  5.  Pt to improve grip strength Lt hand to 38 lbs or greater to assist more in functional tasks Baseline: 32 lbs Goal status: INITIAL   ASSESSMENT:  CLINICAL IMPRESSION: Patient is a 28 y.o. female who was seen today for occupational therapy tx for Lt hemiparesis (from stroke in 2022) w/ some learned non use and new Rt wrist pain  possibly from overuse and OA. Hx includes CVA. Pt is also currently pregnant. Pt participating well with wrist strengthening and Fm coordination of L hand, was somewhat limited in participation but this is d/t arriving to appt post aquatic therapy. Pt would benefit from continued skilled OT services to improve ability to complete ADL/IADL and leisure tasks.   PERFORMANCE DEFICITS: in functional skills including ADLs, IADLs, coordination, dexterity, sensation, strength, pain, Fine motor control, Gross motor control, mobility, balance, body mechanics, decreased knowledge of use of DME, and UE functional use.   IMPAIRMENTS: are limiting patient from ADLs, IADLs, leisure, and childcare needs.   CO-MORBIDITIES: may have co-morbidities  that affects occupational performance. Patient will benefit from skilled OT to address above impairments and improve overall function.  MODIFICATION OR ASSISTANCE TO COMPLETE EVALUATION: No modification of tasks or assist necessary to complete an evaluation.  OT OCCUPATIONAL PROFILE AND HISTORY: Detailed assessment: Review of records and additional review of physical, cognitive, psychosocial history related to current functional performance.  CLINICAL DECISION MAKING: Moderate - several treatment options, min-mod task modification necessary  REHAB POTENTIAL: Good  EVALUATION COMPLEXITY: Moderate    PLAN:  OT FREQUENCY: 2x/week  OT DURATION: 6 weeks (plus eval)   PLANNED INTERVENTIONS: 97535 self care/ADL training, 02889 therapeutic exercise, 97530 therapeutic activity, 97112 neuromuscular re-education, 97140 manual therapy, 97018 paraffin, 02960 fluidotherapy, 97010 moist heat, 97010 cryotherapy, 97034 contrast bath, 97760 Orthotic Initial, 97763 Orthotic/Prosthetic subsequent, passive range of motion, patient/family education, and DME and/or AE instructions  RECOMMENDED OTHER SERVICES: none at this time  CONSULTED AND AGREED WITH PLAN OF CARE: Patient  PLAN  FOR NEXT SESSION: coordination tasks for LUE and BUE tasks  F/u RUE wrist strengthening HEP with dumbbell Endurance for  LUE    For all possible CPT codes, reference the Planned Interventions line above.     Check all conditions that are expected to impact treatment: {Conditions expected to impact treatment:Neurological condition and/or seizures and Current pregnancy or recent postpartum   If treatment provided at initial evaluation, no treatment charged due to lack of authorization.        Rocky Dutch, OT 12/02/2023, 1:56 PM

## 2023-12-03 ENCOUNTER — Ambulatory Visit: Attending: Certified Nurse Midwife

## 2023-12-03 ENCOUNTER — Other Ambulatory Visit: Payer: Self-pay | Admitting: *Deleted

## 2023-12-03 ENCOUNTER — Ambulatory Visit: Admitting: Cardiology

## 2023-12-03 ENCOUNTER — Ambulatory Visit (HOSPITAL_BASED_OUTPATIENT_CLINIC_OR_DEPARTMENT_OTHER): Admitting: Obstetrics

## 2023-12-03 VITALS — BP 112/72 | HR 80 | Ht 69.0 in | Wt 179.0 lb

## 2023-12-03 VITALS — BP 117/52 | HR 64

## 2023-12-03 DIAGNOSIS — R5383 Other fatigue: Secondary | ICD-10-CM | POA: Diagnosis not present

## 2023-12-03 DIAGNOSIS — O358XX0 Maternal care for other (suspected) fetal abnormality and damage, not applicable or unspecified: Secondary | ICD-10-CM

## 2023-12-03 DIAGNOSIS — O2292 Venous complication in pregnancy, unspecified, second trimester: Secondary | ICD-10-CM | POA: Insufficient documentation

## 2023-12-03 DIAGNOSIS — Z8774 Personal history of (corrected) congenital malformations of heart and circulatory system: Secondary | ICD-10-CM | POA: Diagnosis not present

## 2023-12-03 DIAGNOSIS — Z8673 Personal history of transient ischemic attack (TIA), and cerebral infarction without residual deficits: Secondary | ICD-10-CM

## 2023-12-03 DIAGNOSIS — Z3A19 19 weeks gestation of pregnancy: Secondary | ICD-10-CM

## 2023-12-03 DIAGNOSIS — U070 Vaping-related disorder: Secondary | ICD-10-CM

## 2023-12-03 DIAGNOSIS — O0990 Supervision of high risk pregnancy, unspecified, unspecified trimester: Secondary | ICD-10-CM | POA: Diagnosis present

## 2023-12-03 DIAGNOSIS — R0602 Shortness of breath: Secondary | ICD-10-CM | POA: Diagnosis not present

## 2023-12-03 DIAGNOSIS — O99332 Smoking (tobacco) complicating pregnancy, second trimester: Secondary | ICD-10-CM

## 2023-12-03 DIAGNOSIS — O99412 Diseases of the circulatory system complicating pregnancy, second trimester: Secondary | ICD-10-CM

## 2023-12-03 DIAGNOSIS — I639 Cerebral infarction, unspecified: Secondary | ICD-10-CM

## 2023-12-03 DIAGNOSIS — Q2112 Patent foramen ovale: Secondary | ICD-10-CM

## 2023-12-03 NOTE — Progress Notes (Signed)
 Cardio-Obstetrics Clinic  New Evaluation  Date:  12/08/2023   ID:  Kristen Grant, DOB 1995/07/22, MRN [id number]  PCP:  Patient, No Pcp Per   [facility] Providers Cardiologist:  Dub Huntsman, DO  Electrophysiologist:  None       Referring MD: Jayne Harlene LITTIE, CNM   Chief Complaint:  I am   History of Present Illness:    Kristen Grant is a 28 y.o. female [G3P1011] who is being seen today for the evaluation of  at the request of Jayne Harlene LITTIE, CNM.    Medical hx includes CVA , status post PFO closure  presents for cardiovascular care during pregnancy.  She follows with Dr. Gordy Schwalbe and will be transferred back under his care after she delivers.   She reports that she has been experiencing intermittent shortness of breath and fatigue recently. No chest pain.  This symptoms prompted her referral to the cardio ob clinic.    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   Stroke (HCC)    hx 2 ischemic strokesin September 2022--residual weakness to L face, arm and leg    Past Surgical History:  Procedure Laterality Date   BUBBLE STUDY  10/17/2020   Procedure: BUBBLE STUDY;  Surgeon: Jeffrie Oneil BROCKS, MD;  Location: [facility] ENDOS[facility];  Service: Cardiovascular;;   IR ANGIO INTRA EXTRACRAN SEL COM CAROTID INNOMINATE UNI L MOD SED  10/11/2020   IR ANGIO VERTEBRAL SEL VERTEBRAL UNI L MOD SED  10/11/2020   IR CT HEAD LTD  10/11/2020   IR CT HEAD LTD  10/12/2020   IR PERCUTANEOUS ART THROMBECTOMY/INFUSION INTRACRANIAL INC DIAG ANGIO  10/11/2020   IR PERCUTANEOUS ART THROMBECTOMY/INFUSION  INTRACRANIAL INC DIAG ANGIO  10/12/2020   NO PAST SURGERIES     PATENT FORAMEN OVALE(PFO) CLOSURE N/A 12/10/2020   Procedure: PATENT FORAMEN OVALE (PFO) CLOSURE;  Surgeon: Ladona Gordy, MD;  Location: [facility] INVASIVE CV LAB;  Service: Cardiovascular;  Laterality: N/A;   PATENT FORAMEN OVALE(PFO) CLOSURE N/A 06/16/2023   Procedure: PATENT FORAMEN OVALE(PFO) CLOSURE;  Surgeon: Ladona Gordy, MD;  Location: [facility] CV [facility];  Service: Cardiovascular;  Laterality: N/A;   RADIOLOGY WITH ANESTHESIA N/A 10/12/2020   Procedure: RADIOLOGY WITH ANESTHESIA;  Surgeon: Radiologist, Medication, MD;  Location: [facility] OR;  Service: Radiology;  Laterality: N/A;   RADIOLOGY WITH ANESTHESIA N/A 10/11/2020   Procedure: IR WITH ANESTHESIA;  Surgeon: Radiologist, Medication, MD;  Location: [facility] OR;  Service: Radiology;  Laterality: N/A;   TEE WITHOUT CARDIOVERSION N/A 10/17/2020   Procedure: TRANSESOPHAGEAL ECHOCARDIOGRAM (TEE);  Surgeon: Jeffrie Oneil BROCKS, MD;  Location: [facility] ENDOSCOPY;  Service: Cardiovascular;  Laterality: N/A;   TRANSESOPHAGEAL ECHOCARDIOGRAM (CATH LAB) N/A 05/05/2023   Procedure: TRANSESOPHAGEAL ECHOCARDIOGRAM;  Surgeon: Mona Vinie BROCKS, MD;  Location: [facility] INV[facility] CV [facility];  Service: Cardiovascular;  Laterality: N/A;      OB History     Gravida  3   Para  1   Term  1   Preterm  0   AB  1   Living  1  SAB  1   IAB  0   Ectopic  0   Multiple      Live Births  1               Current Medications: Current Meds  Medication Sig   acetaminophen  (TYLENOL ) 500 MG tablet Take 2 tablets (1,000 mg total) by mouth every 6 (six) hours as needed.   aspirin  EC 81 MG tablet Take 1 tablet (81 mg total) by mouth in the morning and at bedtime. Twice daily for 1 month and then 1/day   enoxaparin (LOVENOX) 40 MG/0.4ML injection Inject 0.4 mLs (40 mg total) into the skin daily.   hydrOXYzine  (ATARAX ) 25 MG tablet Take 1 tablet (25 mg total) by mouth every 6 (six) hours as needed for anxiety (or insomnia).    venlafaxine  XR (EFFEXOR -XR) 75 MG 24 hr capsule Take 1 capsule (75 mg total) by mouth daily.     Allergies:   Patient has no known allergies.   Social History   Socioeconomic History   Marital status: Single    Spouse name: Armed Forces Operational Officer   Number of children: 1   Years of education: Not on file   Highest education level: GED or equivalent  Occupational History   Not on file  Tobacco Use   Smoking status: Former    Types: E-cigarettes, Cigars    Start date: 2021   Smokeless tobacco: Never   Tobacco comments:    Per pt, Stopped smoking black and milds and vaping 2022  Vaping Use   Vaping status: Former   Quit date: 01/02/2022   Substances: Nicotine, THC   Devices: Patient tring to quit  Substance and Sexual Activity   Alcohol use: Not Currently    Alcohol/week: 1.0 standard drink of alcohol    Types: 1 Cans of beer per week    Comment: last use 02/2023 wine   Drug use: Yes    Types: Marijuana    Comment: trying to quit   Sexual activity: Yes    Partners: Male    Birth control/protection: None    Comment: depo stopped 2022  Other Topics Concern   Not on file  Social History Narrative   Not on file   Social Drivers of Health   Financial Resource Strain: Low Risk  (09/17/2023)   Overall Financial Resource Strain (CARDIA)    Difficulty of Paying Living Expenses: Not hard at all  Food Insecurity: No Food Insecurity (09/17/2023)   Hunger Vital Sign    Worried About Running Out of Food in the Last Year: Never true    Ran Out of Food in the Last Year: Never true  Transportation Needs: No Transportation Needs (09/17/2023)   PRAPARE - Administrator, Civil Service (Medical): No    Lack of Transportation (Non-Medical): No  Physical Activity: Inactive (09/17/2023)   Exercise Vital Sign    Days of Exercise per Week: 0 days    Minutes of Exercise per Session: 0 min  Stress: No Stress Concern Present (09/17/2023)   [employer] of Occupational Health -  Occupational Stress Questionnaire    Feeling of Stress: Only a little  Social Connections: Socially Integrated (03/22/2023)   Social Connection and Isolation Panel    Frequency of Communication with Friends and Family: More than three times a week    Frequency of Social Gatherings with Friends and Family: More than three times a week    Attends Religious Services: More than 4 times per  year    Active Member of Clubs or Organizations: Yes    Attends Engineer, Structural: More than 4 times per year    Marital Status: Living with partner      Family History  Problem Relation Age of Onset   Diabetes Mother    Hypertension Mother    Epilepsy Mother    Anxiety disorder Mother    Depression Mother    Migraines Mother    Diabetes Maternal Grandmother    Stroke Maternal Grandmother    Hypertension Maternal Grandmother    Asthma Half-Brother    Asthma Half-Sister    Breast cancer Neg Hx       ROS:   Please see the history of present illness.     All other systems reviewed and are negative.   Labs/EKG Reviewed:    EKG:  None today    Recent Labs: 10/21/2023: ALT 8; BUN <5; Creatinine, Ser 0.60; Hemoglobin 12.4; Platelets 265; Potassium 3.4; Sodium 133   Recent Lipid Panel Lab Results  Component Value Date/Time   CHOL 181 10/07/2022 12:01 PM   TRIG 94 10/07/2022 12:01 PM   HDL 52 10/07/2022 12:01 PM   CHOLHDL 3.5 10/07/2022 12:01 PM   CHOLHDL 4.0 10/12/2020 12:18 AM   LDLCALC 112 (H) 10/07/2022 12:01 PM    Physical Exam:    VS:  BP 112/72 (BP Location: Left Arm, Patient Position: Sitting, Cuff Size: Normal)   Pulse 80   Ht 5' 9 (1.753 m)   Wt 179 lb (81.2 kg)   LMP  (LMP Unknown)   SpO2 96%   BMI 26.43 kg/m     Wt Readings from Last 3 Encounters:  12/05/23 178 lb (80.7 kg)  12/03/23 179 lb (81.2 kg)  11/29/23 176 lb 9.6 oz (80.1 kg)     GEN:  Well nourished, well developed in no acute distress HEENT: Normal NECK: No JVD; No carotid  bruits LYMPHATICS: No lymphadenopathy CARDIAC: RRR, no murmurs, rubs, gallops RESPIRATORY:  Clear to auscultation without rales, wheezing or rhonchi  ABDOMEN: Soft, non-tender, non-distended MUSCULOSKELETAL:  No edema; No deformity  SKIN: Warm and dry NEUROLOGIC:  Alert and oriented x 3 PSYCHIATRIC:  Normal affect    Risk Assessment/Risk Calculators:     CARPREG II Risk Prediction Index Score:  0.  The patient's risk for a primary cardiac event is 5%.   Modified World Health Organization [facility]) Classification of Maternal CV Risk   Class I         ASSESSMENT & PLAN:    High-risk pregnancy due to history of congenital heart disease and prior stroke Pregnancy classified as modified WHO class I due to repaired congenital heart defect, indicating low cardiovascular risk. On Lovenox for anticoagulation, safe for pregnancy. - Continue Lovenox throughout pregnancy. - Repeat echocardiogram to assess status post PFO closure. - Plan for delivery at [facility] in [place]. - Schedule follow-up in 16 weeks for third trimester evaluation.  Patent foramen ovale closure PFO closure successful with no current flow detected. - Repeat echocardiogram to assess status post PFO closure.  Stroke without residual deficits History of stroke with no residual deficits. On Lovenox to prevent recurrence during high-risk pregnancy. - Continue Lovenox throughout pregnancy.    Patient Instructions  Medication Instructions:  Your physician recommends that you continue on your current medications as directed. Please refer to the Current Medication list given to you today.  *If you need a refill on your cardiac medications before your next appointment, please  call your pharmacy*   Testing/Procedures: Your physician has requested that you have an echocardiogram - OB. Echocardiography is a painless test that uses sound waves to create images of your heart. It provides your doctor with information about  the size and shape of your heart and how well your heart's chambers and valves are working. This procedure takes approximately one hour. There are no restrictions for this procedure. Please do NOT wear cologne, perfume, aftershave, or lotions (deodorant is allowed). Please arrive 15 minutes prior to your appointment time.  Please note: We ask at that you not bring children with you during ultrasound (echo/ vascular) testing. Due to room size and safety concerns, children are not allowed in the ultrasound rooms during exams. Our front office staff cannot provide observation of children in our lobby area while testing is being conducted. An adult accompanying a patient to their appointment will only be allowed in the ultrasound room at the discretion of the ultrasound technician under special circumstances. We apologize for any inconvenience.   Follow-Up: At [facility], you and your health needs are our priority.  As part of our continuing mission to provide you with exceptional heart care, our providers are all part of one team.  This team includes your primary Cardiologist (physician) and Advanced Practice Providers or APPs (Physician Assistants and Nurse Practitioners) who all work together to provide you with the care you need, when you need it.  Your next appointment:   16 week(s)  Provider:   Lum Louis     Dispo:  No follow-ups on file.   Medication Adjustments/Labs and Tests Ordered: Current medicines are reviewed at length with the patient today.  Concerns regarding medicines are outlined above.  Tests Ordered: Orders Placed This Encounter  Procedures   ECHOCARDIOGRAM COMPLETE   Medication Changes: No orders of the defined types were placed in this encounter.

## 2023-12-03 NOTE — Patient Instructions (Addendum)
 Medication Instructions:  Your physician recommends that you continue on your current medications as directed. Please refer to the Current Medication list given to you today.  *If you need a refill on your cardiac medications before your next appointment, please call your pharmacy*   Testing/Procedures: Your physician has requested that you have an echocardiogram - OB. Echocardiography is a painless test that uses sound waves to create images of your heart. It provides your doctor with information about the size and shape of your heart and how well your heart's chambers and valves are working. This procedure takes approximately one hour. There are no restrictions for this procedure. Please do NOT wear cologne, perfume, aftershave, or lotions (deodorant is allowed). Please arrive 15 minutes prior to your appointment time.  Please note: We ask at that you not bring children with you during ultrasound (echo/ vascular) testing. Due to room size and safety concerns, children are not allowed in the ultrasound rooms during exams. Our front office staff cannot provide observation of children in our lobby area while testing is being conducted. An adult accompanying a patient to their appointment will only be allowed in the ultrasound room at the discretion of the ultrasound technician under special circumstances. We apologize for any inconvenience.   Follow-Up: At [facility], you and your health needs are our priority.  As part of our continuing mission to provide you with exceptional heart care, our providers are all part of one team.  This team includes your primary Cardiologist (physician) and Advanced Practice Providers or APPs (Physician Assistants and Nurse Practitioners) who all work together to provide you with the care you need, when you need it.  Your next appointment:   16 week(s)  Provider:   Lum Louis

## 2023-12-04 ENCOUNTER — Inpatient Hospital Stay (HOSPITAL_COMMUNITY)
Admission: AD | Admit: 2023-12-04 | Discharge: 2023-12-05 | Disposition: A | Discharge status: Home / Self Care | Attending: Emergency Medicine | Admitting: Emergency Medicine

## 2023-12-04 DIAGNOSIS — R1030 Lower abdominal pain, unspecified: Secondary | ICD-10-CM | POA: Diagnosis not present

## 2023-12-04 DIAGNOSIS — Q2112 Patent foramen ovale: Secondary | ICD-10-CM | POA: Diagnosis not present

## 2023-12-04 DIAGNOSIS — Z8673 Personal history of transient ischemic attack (TIA), and cerebral infarction without residual deficits: Secondary | ICD-10-CM | POA: Diagnosis not present

## 2023-12-04 DIAGNOSIS — Z7901 Long term (current) use of anticoagulants: Secondary | ICD-10-CM | POA: Insufficient documentation

## 2023-12-04 DIAGNOSIS — O26892 Other specified pregnancy related conditions, second trimester: Secondary | ICD-10-CM | POA: Diagnosis not present

## 2023-12-04 DIAGNOSIS — O99322 Drug use complicating pregnancy, second trimester: Secondary | ICD-10-CM | POA: Insufficient documentation

## 2023-12-04 DIAGNOSIS — O09292 Supervision of pregnancy with other poor reproductive or obstetric history, second trimester: Secondary | ICD-10-CM | POA: Insufficient documentation

## 2023-12-04 DIAGNOSIS — Y92410 Unspecified street and highway as the place of occurrence of the external cause: Secondary | ICD-10-CM | POA: Insufficient documentation

## 2023-12-04 DIAGNOSIS — Z723 Lack of physical exercise: Secondary | ICD-10-CM | POA: Insufficient documentation

## 2023-12-04 DIAGNOSIS — Z3A20 20 weeks gestation of pregnancy: Secondary | ICD-10-CM | POA: Insufficient documentation

## 2023-12-04 DIAGNOSIS — Z87891 Personal history of nicotine dependence: Secondary | ICD-10-CM | POA: Insufficient documentation

## 2023-12-04 DIAGNOSIS — M545 Low back pain, unspecified: Secondary | ICD-10-CM | POA: Insufficient documentation

## 2023-12-04 NOTE — Progress Notes (Signed)
 MFM Consult Note  Kristen Grant is currently at 19 weeks and 4 days.She was seen due to a history of a stroke in 2022.  Her stroke may have been related to a patent foramen ovale that has since been closed with a procedure.  She reports that she experiences left-sided body weakness and has occasional weakness in her legs.  She is undergoing physical therapy and occupational therapy.  She also reports a history of severe anxiety that is treated with Effexor  and Atarax .    Due to her prior thrombotic stroke, she is being treated with prophylactic Lovenox 40 mg daily.  She denies any problems in her current pregnancy.    She had a cell free DNA test earlier in her pregnancy which indicated a low risk for trisomy 51, 3, and 13. A female fetus is predicted.   Sonographic findings Single intrauterine pregnancy at 19w 5d  Fetal cardiac activity:  Observed and appears normal. Presentation: Variable. The views of the fetal anatomy were limited due to the fetal position.  What was visualized today appeared within normal limits Fetal biometry shows the estimated fetal weight at the 31st percentile.  Amniotic fluid: Within normal limits.  MVP: 4.7 cm. Placenta: Anterior. Adnexa: No abnormality visualized. Cervical length: 3.5 cm.  The patient was informed that anomalies may be missed due to technical limitations. If the fetus is in a suboptimal position or maternal habitus is increased, visualization of the fetus in the maternal uterus may be impaired.  Prior thrombotic stroke The patient is being seen by the cardio obstetrics team later today.   As her patent foramen ovale was closed prior to her pregnancy, her risk of a recurrent stroke should be low.   She should continue prophylactic Lovenox for the duration of her pregnancy.   She should receive an anesthesia consult prior to delivery.   We will continue to follow her with growth ultrasounds throughout her pregnancy.    A follow-up exam was  scheduled in 5 weeks to assess the fetal growth and to complete the views of the fetal anatomy.    The patient stated that all of her questions were answered today.  A total of 30 minutes was spent counseling and coordinating the care for this patient.  Greater than 50% of the time was spent in direct face-to-face contact.
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
## 2023-12-05 ENCOUNTER — Inpatient Hospital Stay (HOSPITAL_BASED_OUTPATIENT_CLINIC_OR_DEPARTMENT_OTHER)

## 2023-12-05 ENCOUNTER — Other Ambulatory Visit: Payer: Self-pay

## 2023-12-05 DIAGNOSIS — Y92410 Unspecified street and highway as the place of occurrence of the external cause: Secondary | ICD-10-CM | POA: Diagnosis not present

## 2023-12-05 DIAGNOSIS — Z723 Lack of physical exercise: Secondary | ICD-10-CM | POA: Diagnosis not present

## 2023-12-05 DIAGNOSIS — Z3A20 20 weeks gestation of pregnancy: Secondary | ICD-10-CM

## 2023-12-05 DIAGNOSIS — Z8673 Personal history of transient ischemic attack (TIA), and cerebral infarction without residual deficits: Secondary | ICD-10-CM

## 2023-12-05 DIAGNOSIS — O26892 Other specified pregnancy related conditions, second trimester: Secondary | ICD-10-CM

## 2023-12-05 DIAGNOSIS — Z87891 Personal history of nicotine dependence: Secondary | ICD-10-CM | POA: Diagnosis not present

## 2023-12-05 DIAGNOSIS — O9A212 Injury, poisoning and certain other consequences of external causes complicating pregnancy, second trimester: Secondary | ICD-10-CM

## 2023-12-05 DIAGNOSIS — M545 Low back pain, unspecified: Secondary | ICD-10-CM | POA: Diagnosis not present

## 2023-12-05 DIAGNOSIS — Z7901 Long term (current) use of anticoagulants: Secondary | ICD-10-CM | POA: Diagnosis not present

## 2023-12-05 DIAGNOSIS — Q2112 Patent foramen ovale: Secondary | ICD-10-CM | POA: Diagnosis not present

## 2023-12-05 DIAGNOSIS — O09292 Supervision of pregnancy with other poor reproductive or obstetric history, second trimester: Secondary | ICD-10-CM | POA: Diagnosis not present

## 2023-12-05 DIAGNOSIS — R1030 Lower abdominal pain, unspecified: Secondary | ICD-10-CM | POA: Diagnosis present

## 2023-12-05 DIAGNOSIS — O99322 Drug use complicating pregnancy, second trimester: Secondary | ICD-10-CM | POA: Diagnosis not present
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
## 2023-12-05 NOTE — ED Notes (Signed)
Pt transported to MAU

## 2023-12-05 NOTE — ED Notes (Signed)
 Report called to MAU
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
## 2023-12-07 ENCOUNTER — Ambulatory Visit: Admitting: Occupational Therapy

## 2023-12-07 ENCOUNTER — Ambulatory Visit: Admitting: Physical Therapy

## 2023-12-08 ENCOUNTER — Other Ambulatory Visit: Payer: Self-pay | Admitting: Certified Nurse Midwife

## 2023-12-08 DIAGNOSIS — O0990 Supervision of high risk pregnancy, unspecified, unspecified trimester: Secondary | ICD-10-CM

## 2023-12-08 DIAGNOSIS — Z8673 Personal history of transient ischemic attack (TIA), and cerebral infarction without residual deficits: Secondary | ICD-10-CM
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
## 2023-12-09 ENCOUNTER — Ambulatory Visit

## 2023-12-09 ENCOUNTER — Ambulatory Visit: Attending: Physical Medicine & Rehabilitation | Admitting: Physical Therapy

## 2023-12-09 ENCOUNTER — Encounter: Payer: Self-pay | Admitting: Physical Therapy

## 2023-12-09 ENCOUNTER — Encounter: Payer: Self-pay | Admitting: Certified Nurse Midwife

## 2023-12-09 VITALS — BP 114/67 | HR 72

## 2023-12-09 DIAGNOSIS — M6281 Muscle weakness (generalized): Secondary | ICD-10-CM | POA: Insufficient documentation

## 2023-12-09 DIAGNOSIS — R278 Other lack of coordination: Secondary | ICD-10-CM

## 2023-12-09 DIAGNOSIS — R2689 Other abnormalities of gait and mobility: Secondary | ICD-10-CM | POA: Diagnosis present

## 2023-12-09 DIAGNOSIS — M25531 Pain in right wrist: Secondary | ICD-10-CM

## 2023-12-09 DIAGNOSIS — M25562 Pain in left knee: Secondary | ICD-10-CM | POA: Diagnosis present

## 2023-12-09 DIAGNOSIS — R29898 Other symptoms and signs involving the musculoskeletal system: Secondary | ICD-10-CM | POA: Insufficient documentation

## 2023-12-09 DIAGNOSIS — I69354 Hemiplegia and hemiparesis following cerebral infarction affecting left non-dominant side: Secondary | ICD-10-CM | POA: Insufficient documentation
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
## 2023-12-09 NOTE — Therapy (Signed)
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 from therapy (discussed with pt; pt verbalizing agreement).  OBJECTIVE IMPAIRMENTS: Abnormal gait, decreased activity tolerance, decreased balance, decreased coordination, decreased endurance, decreased mobility, decreased strength, and pain.   ACTIVITY LIMITATIONS: carrying, lifting, bending, standing, squatting, stairs, locomotion level, and caring for others  PARTICIPATION LIMITATIONS: cleaning, shopping, community activity, occupation, yard work, and church  PERSONAL FACTORS: Past/current experiences, Time since onset of injury/illness/exacerbation, and 3+ comorbidities: R MCA stroke,  migraine HA, L knee pain, R wrist pain are also affecting patient's functional outcome.   REHAB POTENTIAL: Good  CLINICAL DECISION MAKING: Evolving/moderate complexity  EVALUATION COMPLEXITY: Moderate  PLAN:  PT FREQUENCY: 2x/week  PT DURATION: 4 weeks  PLANNED INTERVENTIONS: 97164- PT Re-evaluation, 97750- Physical Performance Testing, 97110-Therapeutic  exercises, 97530- Therapeutic activity, [id number]- Neuromuscular re-education, 97535- Self Care, [id number]- Manual therapy, [id number]- Gait training, [phone]- Orthotic Initial, [phone]- Orthotic/Prosthetic subsequent, [phone]- Canalith repositioning, [phone]- Aquatic Therapy, Patient/Family education, Balance training, Stair training, Taping, Vestibular training, DME instructions, Cryotherapy, and Moist heat  PLAN FOR NEXT SESSION: Trial foot-up brace and discharge from PT.  Sci-Fit; L LE strengthening; pain control; balance; progress HEP; walking program.  Try hip circles and openers at ballet bar/counter to see if pt has relief on land as well!   Damien Caulk, PT 12/09/2023, 5:05 PM

## 2023-12-10 ENCOUNTER — Ambulatory Visit: Admitting: Certified Nurse Midwife

## 2023-12-10 ENCOUNTER — Other Ambulatory Visit (HOSPITAL_COMMUNITY)
Admission: RE | Admit: 2023-12-10 | Discharge: 2023-12-10 | Disposition: A | Source: Ambulatory Visit | Attending: Certified Nurse Midwife | Admitting: Certified Nurse Midwife

## 2023-12-10 ENCOUNTER — Encounter: Payer: Self-pay | Admitting: Certified Nurse Midwife

## 2023-12-10 VITALS — BP 109/61 | HR 72 | Wt 174.0 lb

## 2023-12-10 DIAGNOSIS — Z8673 Personal history of transient ischemic attack (TIA), and cerebral infarction without residual deficits: Secondary | ICD-10-CM

## 2023-12-10 DIAGNOSIS — O26892 Other specified pregnancy related conditions, second trimester: Secondary | ICD-10-CM

## 2023-12-10 DIAGNOSIS — N898 Other specified noninflammatory disorders of vagina: Secondary | ICD-10-CM

## 2023-12-10 DIAGNOSIS — Z131 Encounter for screening for diabetes mellitus: Secondary | ICD-10-CM

## 2023-12-10 DIAGNOSIS — Z3A20 20 weeks gestation of pregnancy: Secondary | ICD-10-CM

## 2023-12-10 DIAGNOSIS — F329 Major depressive disorder, single episode, unspecified: Secondary | ICD-10-CM

## 2023-12-10 DIAGNOSIS — Z113 Encounter for screening for infections with a predominantly sexual mode of transmission: Secondary | ICD-10-CM

## 2023-12-10 DIAGNOSIS — D649 Anemia, unspecified: Secondary | ICD-10-CM

## 2023-12-10 DIAGNOSIS — O0990 Supervision of high risk pregnancy, unspecified, unspecified trimester: Secondary | ICD-10-CM | POA: Diagnosis not present

## 2023-12-10 DIAGNOSIS — O99340 Other mental disorders complicating pregnancy, unspecified trimester: Secondary | ICD-10-CM

## 2023-12-10 MED ORDER — ONDANSETRON 4 MG PO TBDP: 4.0000 mg | ORAL_TABLET | Freq: Three times a day (TID) | ORAL | 1 refills | Status: DC | PRN

## 2023-12-10 NOTE — Addendum Note (Signed)
 Addended by: JAYNE RAISIN on: 12/10/2023 09:05 AM   Modules accepted: Orders

## 2023-12-10 NOTE — Assessment & Plan Note (Signed)
 Established with Dr. Sheena, echo ordered. Deliver in [place] for access to higher level of care. Continue with MFM as scheduled, daily Lovenox.

## 2023-12-10 NOTE — Assessment & Plan Note (Signed)
 Symptoms improved, continue Effexor  at 75mg  & hydroxyzine  25mg  po at bedtime prn insomnia. Has appointment with [facility].

## 2023-12-10 NOTE — Assessment & Plan Note (Signed)
 Red flag symptoms reviewed. Plan transfer of care to [facility] for delivery at [facility] per Dr. Ginette recommendation. Gloris aware of recommendation & in agreement.

## 2023-12-10 NOTE — Patient Instructions (Signed)
 Second Trimester of Pregnancy  The second trimester of pregnancy is from week 14 through week 27. This is months 4 through 6 of pregnancy. During the second trimester: Morning sickness is less or has stopped. You may have more energy. You may feel hungry more often. At this time, your unborn baby is growing very fast. At the end of the sixth month, the unborn baby may be up to 12 inches long and weigh about 1 pounds. You will likely start to feel the baby move between 16 and 20 weeks of pregnancy. Body changes during your second trimester Your body continues to change during this time. The changes usually go away after your baby is born. Physical changes You will gain more weight. Your belly will get bigger. You may begin to get stretch marks on your hips, belly, and breasts. Your breasts will keep growing and may hurt. You may get dark spots or blotches on your face. A dark line from your belly button to the pubic area may appear. This line is called linea nigra. Your hair may grow faster and get thicker. Health changes You may have headaches. You may have heartburn. You may pee more often. You may have swollen, bulging veins (varicose veins). You may have trouble pooping (constipation), or swollen veins in the butt that can itch or get painful (hemorrhoids). You may have back pain. This is caused by: Weight gain. Pregnancy hormones that are relaxing the joints in your pelvis. Follow these instructions at home: Medicines Talk to your health care provider if you're taking medicines. Ask if the medicines are safe to take during pregnancy. Your provider may change the medicines that you take. Do not take any medicines unless told to by your provider. Take a prenatal vitamin that has at least 600 micrograms (mcg) of folic acid. Do not use herbal medicines, illegal drugs, or medicines that are not approved by your provider. Eating and drinking While you're pregnant your body needs  extra food for your growing baby. Talk with your provider about what to eat while pregnant. Activity Most women are able to exercise during pregnancy. Exercises may need to change as your pregnancy goes on. Talk to your provider about your activities and exercise routines. Relieving pain and discomfort Wear a good, supportive bra if your breasts hurt. Rest with your legs raised if you have leg cramps or low back pain. Take warm sitz baths to soothe pain from hemorrhoids. Use hemorrhoid cream if your provider says it's okay. Do not douche. Do not use tampons or scented pads. Do not use hot tubs, steam rooms, or saunas. Safety Wear your seatbelt at all times when you're in a car. Talk to your provider if someone hits you, hurts you, or yells at you. Talk with your provider if you're feeling sad or have thoughts of hurting yourself. Lifestyle Certain things can be harmful while you're pregnant. It's best to avoid the following: Do not drink alcohol,smoke, vape, or use products with nicotine  or tobacco in them. If you need help quitting, talk with your provider. Avoid cat litter boxes and soil used by cats. These things carry germs that can cause harm to your pregnancy and your baby. General instructions Keep all follow-up visits. It helps you and your unborn baby stay as healthy as possible. Write down your questions. Take them to your prenatal visits. Your provider will: Talk with you about your overall health. Give you advice or refer you to specialists who can help with different needs,  including: Prenatal education classes. Mental health and counseling. Foods and healthy eating. Ask for help if you need help with food. Where to find more information American Pregnancy Association: americanpregnancy.org [employer]: acog.org Office on [employer] Health: TravelLesson.ca Contact a health care provider if: You have a headache that does not go away  when you take medicine. You have any of these problems: You can't eat or drink. You throw up or feel like you may throw up. You have watery poop (diarrhea) for 2 days or more. You have pain when you pee or your pee smells bad. You have been sick for 2 days or more and are not getting better. Contact your provider right away if: You have any of these coming from your vagina: Abnormal discharge. Bad-smelling fluid. Bleeding. Your baby is moving less than usual. You have contractions, belly cramping, or have pain in your pelvis or lower back. You have symptoms of high blood pressure or preeclampsia. These include: A severe, throbbing headache that does not go away. Sudden or extreme swelling of your face, hands, legs, or feet. Vision problems: You see spots. You have blurry vision. Your eyes are sensitive to light. If you can't reach the provider, go to an urgent care or emergency room. Get help right away if: You faint, become confused, or can't think clearly. You have chest pain or trouble breathing. You have any kind of injury, such as from a fall or a car crash. These symptoms may be an emergency. Call 911 right away. Do not wait to see if the symptoms will go away. Do not drive yourself to the hospital. This information is not intended to replace advice given to you by your health care provider. Make sure you discuss any questions you have with your health care provider. Document Revised: 10/22/2022 Document Reviewed: 05/22/2022 Elsevier Patient Education  2024 Elsevier Inc. Problems to Watch for During Pregnancy During pregnancy, your body goes through many changes. Some changes may be uncomfortable. But most changes are not a serious problem. It's important to learn when certain signs and symptoms may be a problem. Talk with your health care provider about any medical conditions you have. Make sure you know the symptoms to watch for. Reporting problems early will prevent  complications. Problems to watch for during pregnancy You're more likely to get an infection during pregnancy. Let your provider know if you have signs of infection, such as: A fever. A bad-smelling fluid from your vagina. Peeing too often, wanting to pee urgently, or pain when you pee. Also, let your provider know if: You're very tired, you feel dizzy, or you faint. You have watery poop (diarrhea) for 24 hours or longer. You throw up or feel like throwing up for 24 hours or longer. You have cramping in your belly or have pain in your hips or lower back. You have spotting, bleeding, or leaking of fluid from your vagina. You have pain, swelling, or redness in an arm or leg. You should also watch for signs of high blood pressure and preeclampsia. These signs can be very serious. They include: A headache that doesn't go away when you take medicine. Sudden or very bad swelling of your face, hands, legs, or feet. Problems seeing, such as: You see spots. You have blurry vision. You may be sensitive to light. Why it's important to watch for these problems Watching and reporting problems to your provider can help prevent complications that may affect you and your baby. These  include: Higher risk of giving birth early. Infection that may be passed on to your baby. Higher risk for stillbirth. Follow these instructions at home:  Take your medicines only as told. Keep all follow-up visits. Your provider needs to monitor your health and your baby's health. Where to find more information To learn more, go to these websites: Centers for Disease Control and Prevention (CDC) at TonerPromos.no. Then: Click Health Topics A-Z. Type urgent maternal warning signs in the search box. [employer]ians and Gynecologists (ACOG): acog.org Contact a health care provider if: You have any problems while you're pregnant. You feel your baby moving less than usual. You have any of these things: You  have strong emotions, such as sadness or anxiety, that affect your daily life. You do not feel safe in your home. You use tobacco, alcohol, or drugs, and you need help to stop. Get help right away if: You faint, have a seizure, or cannot think clearly. You have chest pain or difficulty breathing. You have any of the following symptoms and you were unable to reach your provider: You have symptoms of infection, including a fever, or have vaginal bleeding. You have symptoms of high blood pressure or preeclampsia. You have signs or symptoms of labor before 37 weeks of pregnancy. These include: Contractions that are 5 minutes or less apart, or that increase in frequency, intensity, or length. Sudden, sharp pain in the belly, or low back pain. Any amount of fluid that flows from your vagina without stopping. These symptoms may be an emergency. Call 911 right away. Do not wait to see if the symptoms will go away. Do not drive yourself to the hospital. This information is not intended to replace advice given to you by your health care provider. Make sure you discuss any questions you have with your health care provider. Document Revised: 06/30/2022 Document Reviewed: 06/30/2022 Elsevier Patient Education  2024 [employer].

## 2023-12-13 ENCOUNTER — Ambulatory Visit

## 2023-12-14 ENCOUNTER — Ambulatory Visit: Admitting: Physical Therapy

## 2023-12-15 ENCOUNTER — Ambulatory Visit: Admitting: Physical Therapy

## 2023-12-15 ENCOUNTER — Telehealth: Payer: Self-pay | Admitting: Physical Therapy

## 2023-12-15 LAB — CERVICOVAGINAL ANCILLARY ONLY
Bacterial Vaginitis (gardnerella): POSITIVE — AB
Chlamydia: NEGATIVE
Comment: NEGATIVE
Comment: NEGATIVE
Comment: NEGATIVE
Comment: NEGATIVE
Comment: NEGATIVE
Comment: NORMAL
Neisseria Gonorrhea: NEGATIVE

## 2023-12-15 NOTE — Telephone Encounter (Signed)
 This is to document my attempt to call patient after no-show for PT appt this AM.  This is patient's # 1 missed appt.   Primary phone number(s) was used in efforts to contact the patient.   Unable to leave message. No identifiable voice message.    Damien Caulk, PT 12/15/23, 2:09 PM

## 2023-12-17 ENCOUNTER — Encounter: Attending: Physical Medicine & Rehabilitation | Admitting: Physical Medicine & Rehabilitation

## 2023-12-17 DIAGNOSIS — I69354 Hemiplegia and hemiparesis following cerebral infarction affecting left non-dominant side: Secondary | ICD-10-CM | POA: Insufficient documentation

## 2023-12-19 ENCOUNTER — Ambulatory Visit: Payer: Self-pay | Admitting: Certified Nurse Midwife

## 2023-12-19 MED ORDER — METRONIDAZOLE 500 MG PO TABS: 500.0000 mg | ORAL_TABLET | Freq: Two times a day (BID) | ORAL | 0 refills | Status: AC

## 2023-12-22 ENCOUNTER — Other Ambulatory Visit: Payer: Self-pay | Admitting: Certified Nurse Midwife

## 2023-12-31 ENCOUNTER — Ambulatory Visit: Payer: Self-pay | Admitting: Cardiology

## 2023-12-31 ENCOUNTER — Ambulatory Visit (HOSPITAL_COMMUNITY)
Admission: RE | Admit: 2023-12-31 | Discharge: 2023-12-31 | Disposition: A | Source: Ambulatory Visit | Attending: Cardiology

## 2023-12-31 DIAGNOSIS — R0602 Shortness of breath: Secondary | ICD-10-CM | POA: Diagnosis not present

## 2023-12-31 DIAGNOSIS — Z8774 Personal history of (corrected) congenital malformations of heart and circulatory system: Secondary | ICD-10-CM | POA: Diagnosis present

## 2023-12-31 LAB — ECHOCARDIOGRAM COMPLETE
Area-P 1/2: 3.54 cm2
S' Lateral: 3.1 cm

## 2024-01-07 ENCOUNTER — Other Ambulatory Visit: Payer: Self-pay | Admitting: *Deleted

## 2024-01-07 ENCOUNTER — Ambulatory Visit: Attending: Obstetrics and Gynecology | Admitting: Obstetrics and Gynecology

## 2024-01-07 ENCOUNTER — Ambulatory Visit

## 2024-01-07 VITALS — BP 106/46 | HR 69

## 2024-01-07 DIAGNOSIS — Z8673 Personal history of transient ischemic attack (TIA), and cerebral infarction without residual deficits: Secondary | ICD-10-CM

## 2024-01-07 DIAGNOSIS — Z3A24 24 weeks gestation of pregnancy: Secondary | ICD-10-CM | POA: Diagnosis not present

## 2024-01-07 DIAGNOSIS — O358XX0 Maternal care for other (suspected) fetal abnormality and damage, not applicable or unspecified: Secondary | ICD-10-CM

## 2024-01-07 DIAGNOSIS — O09891 Supervision of other high risk pregnancies, first trimester: Secondary | ICD-10-CM

## 2024-01-07 DIAGNOSIS — Z362 Encounter for other antenatal screening follow-up: Secondary | ICD-10-CM

## 2024-01-07 DIAGNOSIS — O2292 Venous complication in pregnancy, unspecified, second trimester: Secondary | ICD-10-CM | POA: Diagnosis not present

## 2024-01-07 DIAGNOSIS — Q2112 Patent foramen ovale: Secondary | ICD-10-CM

## 2024-01-07 NOTE — Progress Notes (Signed)
 Maternal-Fetal Medicine Consultation  Name: Kristen Grant  MRN: [id number]  GA: [id number] [redacted]w[redacted]d   Patient is here for completion of fetal anatomy. Return for [facility] ovale and history of stroke (2022).  Lumbar 2022, the patient had closure of foramen ovale.  Subsequently, in May 2025 she had another endovascular closure and no patency was seen after the procedure. Patient takes Lovenox  40 mg daily. She takes venlafaxine  XR 75 mg daily. Patient denies smoking or vaping in pregnancy. History significant for a term vaginal delivery in 2017 of her female infant weighing 8 pounds and 3 ounces.  Ultrasound Normal fetal growth and amniotic fluid.  Fetal anatomical survey was completed and appears normal.  Cardiac anatomy appears normal.  Patent foramen ovale (PFO) I counseled the patient that pregnancy increases the risk of venous thromboembolism.  The risk of thrombosis and stroke are less of the closure, is not completely correct recurrence.  I encouraged her to continue low molecular weight heparin . I discussed switching to unfractionated heparin  at 36 or [redacted] weeks gestation. Patient is not planning to have epidural analgesia. I reassured the patient of normal fetal cardiac anatomy. I did not recommend fetal echocardiography.  Venlafaxine  It is an antidepressant and is in the same subclass of serotonine-norepinephrine  reuptake inhibitor (SNRI). It crosses the placenta. No increased congenital malformations are expected with the use of this drug. A small absolute increase in primary pulmonary hypertension is expected and the patient was informed. However, depression can have adverse effects in pregnancy. If maternal benefits of taking this drug is considerable, she should continue this medication in pregnancy.   Recommendations - An appointment was made for her to return in 8 weeks for fetal growth assessment. - Consider switching to unfractionated heparin  10,000 units twice  daily from 36 or [redacted] weeks gestation. - Anesthesiology consultation in the third trimester.   Consultation including face-to-face (more than 50%) counseling 30 minutes.

## 2024-01-10 ENCOUNTER — Ambulatory Visit

## 2024-01-11 ENCOUNTER — Ambulatory Visit (INDEPENDENT_AMBULATORY_CARE_PROVIDER_SITE_OTHER): Admitting: Family Medicine

## 2024-01-11 VITALS — BP 110/71 | HR 71 | Wt 193.4 lb

## 2024-01-11 DIAGNOSIS — Z8673 Personal history of transient ischemic attack (TIA), and cerebral infarction without residual deficits: Secondary | ICD-10-CM

## 2024-01-11 DIAGNOSIS — Z2839 Other underimmunization status: Secondary | ICD-10-CM

## 2024-01-11 DIAGNOSIS — Z3A25 25 weeks gestation of pregnancy: Secondary | ICD-10-CM

## 2024-01-11 DIAGNOSIS — O2202 Varicose veins of lower extremity in pregnancy, second trimester: Secondary | ICD-10-CM

## 2024-01-11 DIAGNOSIS — Z3490 Encounter for supervision of normal pregnancy, unspecified, unspecified trimester: Secondary | ICD-10-CM | POA: Insufficient documentation

## 2024-01-11 DIAGNOSIS — Q2112 Patent foramen ovale: Secondary | ICD-10-CM

## 2024-01-11 DIAGNOSIS — I69354 Hemiplegia and hemiparesis following cerebral infarction affecting left non-dominant side: Secondary | ICD-10-CM

## 2024-01-11 DIAGNOSIS — O0990 Supervision of high risk pregnancy, unspecified, unspecified trimester: Secondary | ICD-10-CM

## 2024-01-11 DIAGNOSIS — F329 Major depressive disorder, single episode, unspecified: Secondary | ICD-10-CM

## 2024-01-11 DIAGNOSIS — O99342 Other mental disorders complicating pregnancy, second trimester: Secondary | ICD-10-CM

## 2024-01-11 NOTE — Addendum Note (Signed)
 Addended by: ELAINE ROSINA SAILOR on: 01/11/2024 05:05 PM   Modules accepted: Orders

## 2024-01-11 NOTE — Progress Notes (Signed)
 PRENATAL VISIT NOTE  Subjective:  Kristen Grant is a 28 y.o. G3P1011 at [redacted]w[redacted]d being seen today for ongoing prenatal care.  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  Patient reports leg pain x many years.  Contractions: Not present. Vag. Bleeding: None.  Movement: Present. Denies leaking of fluid.   The following portions of the patient's history were reviewed and updated as appropriate: allergies, current medications, past family history, past medical history, past social history, past surgical history and problem list.   Objective:   Vitals:   01/11/24 1050  BP: 110/71  Pulse: 71  Weight: 193 lb 6 oz (87.7 kg)    Fetal Status:  Fetal Heart Rate (bpm): 143   Movement: Present    General: Alert, oriented and cooperative. Patient is in no acute distress.  Skin: Skin is warm and dry. No rash noted.   Cardiovascular: Normal heart rate noted  Respiratory: Normal respiratory effort, no problems with respiration noted  Abdomen: Soft, gravid, appropriate for gestational age.  Pain/Pressure: Absent     Pelvic: Cervical exam deferred        Extremities: Normal range of motion.  Large vaicose vein in left posterior thigh, no erythema, nodularity   Mental Status: Normal mood  and affect. Normal behavior. Normal judgment and thought content.      12/10/2023    8:38 AM 11/29/2023    9:37 AM 11/09/2023    8:50 AM  Depression screen PHQ 2/9  Decreased Interest 0 3 3  Down, Depressed, Hopeless 1 3 1   PHQ - 2 Score 1 6 4   Altered sleeping 0 3 3  Tired, decreased energy 2 3 3   Change in appetite 1 3 2   Feeling bad or failure about yourself  0 1 1  Trouble concentrating 1 1 3   Moving slowly or fidgety/restless 0 0 1  Suicidal thoughts 0 1 0  PHQ-9 Score 5 18  17    Difficult doing work/chores Somewhat difficult Extremely dIfficult Extremely dIfficult     Data saved with a previous flowsheet row definition        12/10/2023    8:37 AM 11/29/2023    9:38 AM 11/11/2023   10:40 AM 11/09/2023    8:50 AM  GAD 7 : Generalized Anxiety Score  Nervous, Anxious, on Edge 1 3  3   Control/stop worrying 1 3  3   Worry too much - different things 1 3  3   Trouble relaxing 0 3  3  Restless 0 1  0  Easily annoyed or irritable 1 3  3   Afraid - awful might happen 2 2  3   Total GAD 7 Score 6 18  18   Anxiety Difficulty  Extremely difficult  Very difficult     Information is confidential and restricted. Go to Review Flowsheets to unlock data.    Assessment and Plan:  Pregnancy: G3P1011 at [redacted]w[redacted]d 1. PFO repaired 12/2020 (Primary) Repair in place on last echo  2. Hemiparesis affecting left side as late effect of stroke (HCC) Stable clinically  3. Supervision of high risk pregnancy, antepartum Continue prenatal care.  4. Major depressive disorder affecting pregnancy On Effexor   5. History of stroke x2 10/2020 On lovenox   6. Obstetric varicose veins in second trimester To refer to Vein and vascular post delivery  7. Rubella non-immune status, antepartum MMR pp  8. [redacted] weeks gestation of pregnancy   Preterm labor symptoms and general obstetric precautions including but not limited to vaginal bleeding, contractions, leaking of fluid and fetal movement were reviewed  in detail with the patient. Please refer to After Visit Summary for other counseling recommendations.   Return in 3 weeks (on 02/01/2024) for 28 wk labs.  Future Appointments  Date Time Provider Department Center  02/14/2024  7:45 AM Whitfield Raisin, NP [facility] None  03/03/2024  9:15 AM [facility] PROVIDER 1 [facility]  03/03/2024  9:30 AM [facility] US5 [facility]  03/17/2024 10:30 AM Rana Lum CROME, NP [facility]    Glenys GORMAN Birk, MD

## 2024-01-21 ENCOUNTER — Encounter: Payer: Self-pay | Admitting: Family Medicine

## 2024-01-21 MED ORDER — OSELTAMIVIR PHOSPHATE 75 MG PO CAPS: 75.0000 mg | ORAL_CAPSULE | Freq: Two times a day (BID) | ORAL | 0 refills | Status: AC

## 2024-01-22 ENCOUNTER — Emergency Department
Admission: EM | Admit: 2024-01-22 | Discharge: 2024-01-22 | Disposition: A | Discharge status: Home / Self Care | Attending: Emergency Medicine | Admitting: Emergency Medicine

## 2024-01-22 ENCOUNTER — Other Ambulatory Visit: Payer: Self-pay

## 2024-01-22 DIAGNOSIS — J101 Influenza due to other identified influenza virus with other respiratory manifestations: Secondary | ICD-10-CM | POA: Diagnosis not present

## 2024-01-22 DIAGNOSIS — O26892 Other specified pregnancy related conditions, second trimester: Secondary | ICD-10-CM | POA: Insufficient documentation

## 2024-01-22 DIAGNOSIS — Z3A27 27 weeks gestation of pregnancy: Secondary | ICD-10-CM | POA: Diagnosis not present

## 2024-01-22 DIAGNOSIS — R059 Cough, unspecified: Secondary | ICD-10-CM | POA: Diagnosis not present

## 2024-01-22 LAB — CBC WITH DIFFERENTIAL/PLATELET
Abs Immature Granulocytes: 0.03 K/uL (ref 0.00–0.07)
Basophils Absolute: 0 K/uL (ref 0.0–0.1)
Basophils Relative: 0 %
Eosinophils Absolute: 0 K/uL (ref 0.0–0.5)
Eosinophils Relative: 0 %
HCT: 33.9 % — ABNORMAL LOW (ref 36.0–46.0)
Hemoglobin: 11.6 g/dL — ABNORMAL LOW (ref 12.0–15.0)
Immature Granulocytes: 0 %
Lymphocytes Relative: 7 %
Lymphs Abs: 0.5 K/uL — ABNORMAL LOW (ref 0.7–4.0)
MCH: 32 pg (ref 26.0–34.0)
MCHC: 34.2 g/dL (ref 30.0–36.0)
MCV: 93.4 fL (ref 80.0–100.0)
Monocytes Absolute: 0.6 K/uL (ref 0.1–1.0)
Monocytes Relative: 8 %
Neutro Abs: 6.2 K/uL (ref 1.7–7.7)
Neutrophils Relative %: 85 %
Platelets: 224 K/uL (ref 150–400)
RBC: 3.63 MIL/uL — ABNORMAL LOW (ref 3.87–5.11)
RDW: 12.3 % (ref 11.5–15.5)
WBC: 7.4 K/uL (ref 4.0–10.5)
nRBC: 0 % (ref 0.0–0.2)

## 2024-01-22 LAB — RESP PANEL BY RT-PCR (RSV, FLU A&B, COVID)  RVPGX2
Influenza A by PCR: POSITIVE — AB
Influenza B by PCR: NEGATIVE
Resp Syncytial Virus by PCR: NEGATIVE
SARS Coronavirus 2 by RT PCR: NEGATIVE

## 2024-01-22 LAB — COMPREHENSIVE METABOLIC PANEL WITH GFR
ALT: 22 U/L (ref 0–44)
AST: 24 U/L (ref 15–41)
Albumin: 4 g/dL (ref 3.5–5.0)
Alkaline Phosphatase: 54 U/L (ref 38–126)
Anion gap: 10 (ref 5–15)
BUN: <5 mg/dL — ABNORMAL LOW (ref 6–20)
CO2: 25 mmol/L (ref 22–32)
Calcium: 9.1 mg/dL (ref 8.9–10.3)
Chloride: 98 mmol/L (ref 98–111)
Creatinine, Ser: 0.41 mg/dL — ABNORMAL LOW (ref 0.44–1.00)
GFR, Estimated: >60 mL/min
Glucose, Bld: 97 mg/dL (ref 70–99)
Potassium: 4.2 mmol/L (ref 3.5–5.1)
Sodium: 132 mmol/L — ABNORMAL LOW (ref 135–145)
Total Bilirubin: 1 mg/dL (ref 0.0–1.2)
Total Protein: 6.7 g/dL (ref 6.5–8.1)

## 2024-01-22 LAB — MAGNESIUM: Magnesium: 2 mg/dL (ref 1.7–2.4)

## 2024-01-22 MED ORDER — ACETAMINOPHEN 500 MG PO TABS
1000.0000 mg | ORAL_TABLET | Freq: Once | ORAL | Status: AC
  Administered 2024-01-22: 1000 mg via ORAL
  Filled 2024-01-22: qty 2

## 2024-01-22 MED ORDER — ONDANSETRON HCL 4 MG/2ML IJ SOLN
4.0000 mg | Freq: Once | INTRAMUSCULAR | Status: AC
  Administered 2024-01-22: 4 mg via INTRAVENOUS
  Filled 2024-01-22: qty 2

## 2024-01-22 MED ORDER — SODIUM CHLORIDE 0.9 % IV BOLUS
1000.0000 mL | Freq: Once | INTRAVENOUS | Status: AC
  Administered 2024-01-22: 1000 mL via INTRAVENOUS

## 2024-01-22 NOTE — Discharge Instructions (Signed)
 Fortunately your lab testing did not show any severe dehydration, kidney problems, or electrolyte problems.    Take Tylenol  650 mg every 6 hours for aches/pains/fever. Use Zofran  for nausea and vomiting. Take Tamiflu  as prescribed  Drink plenty of fluids to stay well-hydrated.  Find Pedialyte or similar electrolyte rehydration formulas at your local pharmacy.   Please follow-up with your gynecologist for an appointment.   Thank you for choosing us  for your health care today!  Please see your primary doctor this week for a follow up appointment.   If you have any new, worsening, or unexpected symptoms call your doctor right away or come back to the emergency department for reevaluation.  It was my pleasure to care for you today.   Ginnie EDISON Cyrena, MD

## 2024-01-22 NOTE — ED Triage Notes (Addendum)
 Pt presents to ER from home with body aches, headaches, feeling nausea. Pt reports her son tested positive for Flu, pt reports she send a message to her PCP and was rx Tamiflu  and started to take it yesterday. Pt reports no feeling well, body aches increased. Pt reports she is [redacted] weeks pregnant. Pt denies any episodes of emesis. Pt talks in complete sentences no respiratory distress noted

## 2024-01-22 NOTE — ED Provider Notes (Signed)
 "  [facility] Provider Note    Event Date/Time   First MD Initiated Contact with Patient 01/22/24 0502     (approximate)   History   No chief complaint on file.   HPI  Kristen Grant is a 28 y.o. female   Past medical history of [redacted] weeks pregnant and a history of stroke, here with viral URI symptoms, feels dehydrated, poor p.o. intake, for the last several days.  Given the flulike symptoms and known exposure was started on Tamiflu  by her doctor and has been taking for the last couple of days.  Nausea and vomiting.  Cough and myalgias.  Headache.  No abdominal pain.  No urinary symptoms.  No gush of fluids or vaginal bleeding and still feels the baby moving.   Independent Historian contributed to assessment above: Partner at bedside corroborates information past medical history as above  External Medical Documents Reviewed: Prior outpatient notes      Physical Exam   Triage Vital Signs: ED Triage Vitals  Encounter Vitals Group     BP 01/22/24 0200 108/69     Girls Systolic BP Percentile --      Girls Diastolic BP Percentile --      Boys Systolic BP Percentile --      Boys Diastolic BP Percentile --      Pulse Rate 01/22/24 0200 (!) 110     Resp 01/22/24 0200 20     Temp 01/22/24 0200 98.2 F (36.8 C)     Temp Source 01/22/24 0200 Oral     SpO2 01/22/24 0200 98 %     Weight 01/22/24 0201 193 lb (87.5 kg)     Height 01/22/24 0201 5' 9 (1.753 m)     Head Circumference --      Peak Flow --      Pain Score 01/22/24 0201 10     Pain Loc --      Pain Education --      Exclude from Growth Chart --     Most recent vital signs: Vitals:   01/22/24 0200 01/22/24 0706  BP: 108/69 110/70  Pulse: (!) 110 88  Resp: 20 20  Temp: 98.2 F (36.8 C) 98 F (36.7 C)  SpO2: 98% 98%    General: Awake, no distress.  CV:  Good peripheral perfusion.  Resp:  Normal effort.  Abd:  No distention.  Other:  Soft benign abdominal exam, clear lungs to  auscultation without focality or wheezing, overall nontoxic appearance.  Neck supple full range of motion.  Slightly tachycardic.   ED Results / Procedures / Treatments   Labs (all labs ordered are listed, but only abnormal results are displayed) Labs Reviewed  RESP PANEL BY RT-PCR (RSV, FLU A&B, COVID)  RVPGX2 - Abnormal; Notable for the following components:      Result Value   Influenza A by PCR POSITIVE (*)    All other components within normal limits  CBC WITH DIFFERENTIAL/PLATELET - Abnormal; Notable for the following components:   RBC 3.63 (*)    Hemoglobin 11.6 (*)    HCT 33.9 (*)    Lymphs Abs 0.5 (*)    All other components within normal limits  COMPREHENSIVE METABOLIC PANEL WITH GFR - Abnormal; Notable for the following components:   Sodium 132 (*)    BUN <5 (*)    Creatinine, Ser 0.41 (*)    All other components within normal limits  MAGNESIUM      I ordered  and reviewed the above labs they are notable for positive for influenza A.   PROCEDURES:  Critical Care performed: No  Procedures   MEDICATIONS ORDERED IN ED: Medications  sodium chloride  0.9 % bolus 1,000 mL (0 mLs Intravenous Stopped 01/22/24 0702)  ondansetron  (ZOFRAN ) injection 4 mg (4 mg Intravenous Given 01/22/24 0549)  acetaminophen  (TYLENOL ) tablet 1,000 mg (1,000 mg Oral Given 01/22/24 0549)    IMPRESSION / MDM / ASSESSMENT AND PLAN / ED COURSE  I reviewed the triage vital signs and the nursing notes.                                Patient's presentation is most consistent with acute presentation with potential threat to life or bodily function.  Differential diagnosis includes, but is not limited to, viral URI, influenza, dehydration electrolyte disturbance, considered but less likely bacterial infections like bacterial pneumonia, intra-abdominal infections, urinary tract infection, sepsis or meningitis.    MDM:    Patient with flulike illness tested positive for influenza has  already started on Tamiflu .  Looks a bit dehydrated.  Check electrolytes but fortunately look normal.  Given IV fluids, Zofran  with good improvement of symptoms.  Anticipatory guidance given continue with Tamiflu  and follow-up with gynecology.  I considered hospitalization for admission or observation however given overall well appearance low suspicion for acute life-threatening bacterial illnesses, adequate follow-up, I think she can be discharged at this time and follow-up with her gynecology/PMD and return with any new or worsening symptoms        FINAL CLINICAL IMPRESSION(S) / ED DIAGNOSES   Final diagnoses:  Influenza A     Rx / DC Orders   ED Discharge Orders     None        Note:  This document was prepared using Dragon voice recognition software and may include unintentional dictation errors.    Cyrena Mylar, MD 01/22/24 [phone]  "

## 2024-01-24 ENCOUNTER — Other Ambulatory Visit: Payer: Self-pay | Admitting: Certified Nurse Midwife

## 2024-02-02 ENCOUNTER — Other Ambulatory Visit (HOSPITAL_COMMUNITY)
Admission: RE | Admit: 2024-02-02 | Discharge: 2024-02-02 | Disposition: A | Discharge status: Home / Self Care | Source: Ambulatory Visit

## 2024-02-02 ENCOUNTER — Ambulatory Visit (INDEPENDENT_AMBULATORY_CARE_PROVIDER_SITE_OTHER): Admitting: Obstetrics and Gynecology

## 2024-02-02 ENCOUNTER — Other Ambulatory Visit

## 2024-02-02 VITALS — BP 120/82 | HR 84 | Wt 190.0 lb

## 2024-02-02 DIAGNOSIS — O0993 Supervision of high risk pregnancy, unspecified, third trimester: Secondary | ICD-10-CM

## 2024-02-02 DIAGNOSIS — O0990 Supervision of high risk pregnancy, unspecified, unspecified trimester: Secondary | ICD-10-CM

## 2024-02-02 DIAGNOSIS — Z3A28 28 weeks gestation of pregnancy: Secondary | ICD-10-CM

## 2024-02-02 DIAGNOSIS — O26893 Other specified pregnancy related conditions, third trimester: Secondary | ICD-10-CM | POA: Insufficient documentation

## 2024-02-02 DIAGNOSIS — N898 Other specified noninflammatory disorders of vagina: Secondary | ICD-10-CM | POA: Diagnosis not present

## 2024-02-02 DIAGNOSIS — R300 Dysuria: Secondary | ICD-10-CM

## 2024-02-02 MED ORDER — TERCONAZOLE 0.4 % VA CREA: 1.0000 | TOPICAL_CREAM | Freq: Every day | VAGINAL | 0 refills | Status: DC

## 2024-02-02 NOTE — Progress Notes (Signed)
" °  HIGH-RISK PREGNANCY OFFICE VISIT Patient name: Kristen Grant MRN [id number]  Date of birth: [birth year] Chief Complaint:   Routine Prenatal Visit  History of Present Illness:   [name] is a 28 y.o. G29P1011 female at [redacted]w[redacted]d with an Estimated Date of Delivery: 04/23/24 being seen today for ongoing management of a high-risk pregnancy complicated by H/O stroke, PFO Repair in 2022, Depression/Anxiety, and maternal obesity Today she reports vaginal irritation and burning with wiping. Contractions: Not present. Vag. Bleeding: None.  Movement: Present. denies leaking of fluid.  Review of Systems:   Pertinent items are noted in HPI Denies abnormal vaginal discharge w/ itching/odor/irritation, headaches, visual changes, shortness of breath, chest pain, abdominal pain, severe nausea/vomiting, or problems with urination or bowel movements unless otherwise stated above. Pertinent History Reviewed:  Reviewed past medical,surgical, social, obstetrical and family history.  Reviewed problem list, medications and allergies. Physical Assessment:   Vitals:   02/02/24 0905  BP: 120/82  Pulse: 84  Weight: 190 lb (86.2 kg)  Body mass index is 28.06 kg/m.           Physical Examination:   General appearance: alert, well appearing, and in no distress, oriented to person, place, and time, and overweight  Mental status: alert, oriented to person, place, and time, normal mood, behavior, speech, dress, motor activity, and thought processes  Skin: warm & dry   Extremities:      Cardiovascular: normal heart rate noted  Respiratory: normal respiratory effort, no distress  Abdomen: gravid, soft, non-tender  Pelvic: Cervical exam deferred         Fetal Status: Fetal Heart Rate (bpm): 146 Fundal Height: 28 cm Movement: Present    Fetal Surveillance Testing today: none   No results found for this or any previous visit (from the past 24 hours).  Assessment & Plan:  1) High-risk pregnancy G3P1011 at [redacted]w[redacted]d with  an Estimated Date of Delivery: 04/23/24   2) Supervision of high risk pregnancy, antepartum (Primary) - Doing well - Good vigorous FM  3) Vaginal discharge during pregnancy in third trimester - Cervicovaginal ancillary only( [place]) - Prescription for: Terazol 0.4% cream hs x 7 days  4) Vaginal irritation - Terazol as described above  5) Dysuria - Culture Urine, OB  6) [redacted] weeks gestation of pregnancy    Meds: No orders of the defined types were placed in this encounter.   Labs/procedures today: WP, UCx -- Results pending  Treatment Plan:  Rx for Terazol  Reviewed: Preterm labor symptoms and general obstetric precautions including but not limited to vaginal bleeding, contractions, leaking of fluid and fetal movement were reviewed in detail with the patient.  All questions were answered. Has home bp cuff. Check bp weekly, let us  know if >140/90.   Follow-up: Return in about 2 weeks (around 02/16/2024) for Return OB visit.  Orders Placed This Encounter  Procedures   Culture, OB Urine   Ala Cart MSN, CNM 02/02/2024 9:22 AM "

## 2024-02-03 LAB — CBC
Hematocrit: 38.3 % (ref 34.0–46.6)
Hemoglobin: 12.7 g/dL (ref 11.1–15.9)
MCH: 32.2 pg (ref 26.6–33.0)
MCHC: 33.2 g/dL (ref 31.5–35.7)
MCV: 97 fL (ref 79–97)
Platelets: 309 x10E3/uL (ref 150–450)
RBC: 3.95 x10E6/uL (ref 3.77–5.28)
RDW: 12.3 % (ref 11.7–15.4)
WBC: 7.6 x10E3/uL (ref 3.4–10.8)

## 2024-02-03 LAB — GLUCOSE TOLERANCE, 2 HOURS W/ 1HR
Glucose, 1 hour: 127 mg/dL (ref 70–179)
Glucose, 2 hour: 72 mg/dL (ref 70–152)
Glucose, Fasting: 71 mg/dL (ref 70–91)

## 2024-02-03 LAB — SYPHILIS: RPR W/REFLEX TO RPR TITER AND TREPONEMAL ANTIBODIES, TRADITIONAL SCREENING AND DIAGNOSIS ALGORITHM: RPR Ser Ql: NONREACTIVE

## 2024-02-03 LAB — HIV ANTIBODY (ROUTINE TESTING W REFLEX): HIV Screen 4th Generation wRfx: NONREACTIVE

## 2024-02-04 ENCOUNTER — Ambulatory Visit: Payer: Self-pay | Admitting: Obstetrics and Gynecology

## 2024-02-04 DIAGNOSIS — B3731 Acute candidiasis of vulva and vagina: Secondary | ICD-10-CM

## 2024-02-04 LAB — CERVICOVAGINAL ANCILLARY ONLY
Bacterial Vaginitis (gardnerella): NEGATIVE
Candida Glabrata: NEGATIVE
Candida Vaginitis: POSITIVE — AB
Comment: NEGATIVE
Comment: NEGATIVE
Comment: NEGATIVE

## 2024-02-04 LAB — CULTURE, OB URINE

## 2024-02-04 LAB — URINE CULTURE, OB REFLEX

## 2024-02-11 NOTE — Progress Notes (Unsigned)
 " [facility] [address] [place]. [place] [address] [phone]       OFFICE FOLLOW UP NOTE  Ms. Kristen Grant Date of Birth:  Aug 11, 1995 Medical Record Number:  [id number]    Primary neurologist: Dr. Rosemarie Reason for visit: Headaches, hx of stroke    SUBJECTIVE:   CHIEF COMPLAINT:  No chief complaint on file.   HPI:   Update 02/14/2024 JM: Patient returns for follow-up visit.  Stable from stroke standpoint without new stroke/TIA symptoms.  She is currently [redacted] weeks pregnant and is closely being followed by OB/GYN for high risk pregnancy.        History provided for reference purposes only  Update 07/27/2023 JM: Patient returns for follow-up visit. Main concern today is in regards to vertigo type symptoms over the past several months. Typically occurs when at heights (such as roller coasters, looking down at the top of the bridge, or while driving and going down a steep hill), can also occur with quick head movement and getting up out of bed. Lasts only short duration. Can be associated with nausea. Can have increased anxiety, unsure if anxiety is present before or after symptoms. Does report fm hx of vertigo, ?BPPV. Continues to have mild left sided weakness and gait impairment but overall unchanged, does feel she is limping a little bit more but denies any increased weakness. Denies changes in vision or speech.   She continues to have occasional headaches but improved since prior visit. Has about 1-3 per month, not debilitating and usually resolves without intervention. Previously on topiramate  but d/c'd due to pregnancy but unfortunately she had a miscarriage around 9 to [redacted] weeks gestation.  Was seen by Dr. Ladona back in March regarding residual PFO despite prior closure attempts. Underwent TEE in April with stable closure of PFO and no evidence of residual atrial level shunt. Underwent cardiac cath in May with adequate closure of PFO and suspected false  positive TCD bubble study.  She was advised to follow-up with cardiology as needed.   She does remain on aspirin  81 mg daily.  She does not currently have a PCP.  She recently changed jobs and does not have insurance coverage, she is hopeful insurance will take effect beginning of July.  Update 03/22/2023 JM: Patient returns for follow-up visit.  Patient previously seen by Dr. Rosemarie 5 months ago for worsening headaches.  Was started on topiramate  25 mg twice daily.   Patient reports recently finding out that she was pregnant therefore all medications discontinued except for her prenatal vitamins.  She did report some improvement on topiramate  but continued to have headaches about every other day.  Some headaches are associated with photophobia and phonophobia, other headaches can be present upon awakening or in the later evening that is more of a pressure sensation and not associated with photophobia or phonophobia.  She was previously using Aleve as needed but has not used in the past week.  She has her initial prenatal visit today.  Stable from stroke standpoint.  Continue mild left-sided weakness, has been experiencing more left knee pain, no injury, unsure if related to LLE weakness. No new stroke/TIA symptoms.  Aspirin  and atorvastatin  discontinued in setting of pregnancy. TCD bubble 10/2022 showed right to left medium size shunt with Valsalva. Has not had recent follow-up visit with Dr. Ladona.  Update 10/07/2022 Dr. Rosemarie: She returns for follow-up after last visit a year and a half ago with Kristen nurse practitioner.  Patient states she  is doing well from the stroke standpoint without recurrent stroke or TIA symptoms since her initial stroke in September 2022.  She continues to have left hand weakness and left leg stiffness.  Fine motor skills are diminished on the left.  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  She is tolerating aspirin  without side effects.  She has not had recent lab work to check lipid and A1c checked for quite some time.  She has noted increased frequency of her headaches for the last 6 months or so.  Headaches occur 4-5 times a week.  She was previously finding relief with Tylenol  but there is no longer working she is switched to taking Aleve instead.  She has to take 1 or 2 tablets about 4 to 5 days a week for symptomatic relief.  She describes the headaches mostly pressure-like but at times can be severe 8/10 in severity.  She is able to walk most of the time but occasionally has to sit down.  There is some accompanying nausea and light and sound sensitivity.  She does not have to lie down.  She is unable to name specific triggers except physical activities like sex and stress.  She does complain of some muscle tightness in the back of her neck.  She does not do any neck stretching exercises.  She did have endovascular PFO closure on 12/10/2020 by Dr. Ladona but has not had any follow-up TCD bubble study done.   Update 04/21/2021 JM: 29 year old female who returns for stroke follow-up unaccompanied.  Overall doing well since prior visit.  She has since completed therapies reporting residual left hand and toe numbness (especially if gets cold) and mild left hand weakness with occasional twitching/tremors with fatigue and occasional left foot slapping/dragging with fatigue. She does admit to not routinely doing any exercises as advised by therapies.  She has since returned back to work at [employer] and driving as well as maintaining ADLs and IADLs independently.  Denies new stroke/TIA symptoms.  She underwent PFO closure on 12/10/2020 without complication.  Migraine headaches greatly improved since PFO closure and essentially resolved - only 2 headaches since closure.  She has since stopped topiramate  as no longer needed.  She is closely being followed by psychiatry for  depression/anxiety currently on Vistaril , mirtazapine  and venlafaxine .  Compliant on atorvastatin  without side effects. She has since stopped aspirin  as she was told this was no longer needed. She did have some mild bruising and cold sensation while taking. Blood pressure today 117/70.  She has since establish care with PCP Darice Petty, NP and following with OB/GYN.  No further concerns at this time.   Update 12/03/2020 JM: Ms. Whitacre is being seen for initial hospital stroke follow-up unaccompanied. She has been making excellent recovery.  Continues to work with PT/OT at [facility] 2x weekly. Reports continued mild left arm weakness but more so difficulty with fine motor skills. Also reports decreased activity tolerance and complaints of fatigue - notices LLE dragging at that time. Also c/o right lower back pain. Reports speaking with therapy re: AFO brace but has not heard anything else.  Currently on short-term disability which ends 11/6.  She is looking forward to returning back to work at [employer] but she is unsure if she will be abel to return 11/6 as she is scheduled for PFO closure on 11/8. She has since returned back to driving  without difficulty. Able to maintain majority of ADLs and IADLs independently. Headaches well controlled on topiramate  25 mg twice daily but ran out of rx 3-4 days ago and has noticed slight increase in headaches.   Denies new stroke/TIA symptoms.  Reports compliance on aspirin  81 mg daily but recently ran out of atorvastatin . Denies side effects.  Blood pressure today 110/75. Eval by Dr. Ladona on 11/29/2020 for PFO closure - she is scheduled for closure on 12/10/2020. Appt scheduled 11/14 with Tinnie Harada, NP to establish care as PCP.  No further concerns at this time    Stroke admission 10/11/2020 Kristen Grant is a 29 y.o. female with PMHx of anxiety, asthma, migraine headaches and on birth control pills who presented on 10/11/2020 with left-sided weakness.  On  arrival to ED, slight right gaze deviation, left facial droop, dysarthria and inability to move left side as well as c/o right-sided headache.  Personally reviewed hospitalization pertinent progress notes, lab work and imaging.  Evaluated by Dr. Rosemarie.  Stroke work-up revealed right MCA infarct in setting of right M1 occlusion s/p IV TNKase  and mechanical thrombectomy with TICI 3 revascularization require repeat thrombectomy 24 hours later for reocclusion initiate pain TICI 2B-2C revascularization, infarct embolic secondary to cryptogenic etiology. MRI brain showed right MCA territory infarcts predominantly involving right caudate, putamen, anterior limb of R IC, insula, operculum and overlying right frontal lobe as well as a few punctate acute infarcts in the right parietal lobe as well as associated cytotoxic edema with slight (2-78mm) leftward midline shift at the foramen of Monro and probable small amount of hemorrhage in the posterior right frontal lobe/insular region.  EF 60 to 65%.  LDL 117.  A1c 5.0.  Hypercoagulable labs negative.  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 LE Dopplers negative for DVT. TEE noted small PFO.  Placed on aspirin  81 mg daily and atorvastatin  80 mg daily.  UDS positive for THC.  Recommended discontinuing birth control pill and to follow-up with OB/GYN for alternative long-term contraceptive methods.  No prior stroke history.  Therapy eval's recommended CIR for residual left hemiparesis and dysarthria.      PERTINENT IMAGING  Code Stroke  CT head hypodensity at right anterior temporal lobe.   ASPECTS 10.     CTA head & neck:   1. Thrombus in the distal right M1 segment with near complete occlusion. There is reconstitution of flow in superior M2 division but no inferior M2 division is identified. 2. 11 cc infarct core in the right corona radiata, 75 cc penumbra. CT perfusion : CBF (<30%) Volume: 11mL Perfusion (Tmax>6.0s) volume: 86mL Mismatch  Volume: 75mL Infarction Location:Right corona radiata Cerebral angio : report from Dr. Dolphus  bilateral common carotid and Lt VA angiograms followed by complete revascularization of occluded LT MCA M1 seg with x pass with contact aspiration and x1 pass with solitaire 3mm x 40 mm retriever and contact aspiration achieving a TICI 3 revascularization. Post CT brain No ICH . 5.MRI BRAIN:   Acute anterior right MCA territory infarct, predominantly involving the right caudate, putamen, anterior limb of the right internal capsule, insula, operculum and overlying right frontal lobe. A few punctate acute infarcts in the right parietal lobe.  Associated cytotoxic edema with slight (2-3 mm) leftward midline shift at the foramen of Monro. Probable small amount of hemorrhage in the posterior right frontal lobe/insular region. 6. 2D Echo LVEF 60-65%, normal LA, poorly visualized interatrial septum  7. 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   Stroke (HCC)    hx 2 ischemic strokesin September 2022--residual weakness to L face, arm and leg    PSH:  Past Surgical History:  Procedure Laterality Date   BUBBLE STUDY  10/17/2020   Procedure: BUBBLE STUDY;  Surgeon: Jeffrie Oneil BROCKS, MD;  Location: [facility] ENDOSCOPY;  Service: Cardiovascular;;   IR ANGIO INTRA EXTRACRAN SEL COM CAROTID INNOMINATE UNI L MOD SED  10/11/2020   IR ANGIO VERTEBRAL SEL VERTEBRAL UNI L MOD SED  10/11/2020   IR CT HEAD LTD  10/11/2020   IR CT HEAD LTD  10/12/2020   IR PERCUTANEOUS ART  THROMBECTOMY/INFUSION INTRACRANIAL INC DIAG ANGIO  10/11/2020   IR PERCUTANEOUS ART THROMBECTOMY/INFUSION INTRACRANIAL INC DIAG ANGIO  10/12/2020   NO PAST SURGERIES     PATENT FORAMEN OVALE(PFO) CLOSURE N/A 12/10/2020   Procedure: PATENT FORAMEN OVALE (PFO) CLOSURE;  Surgeon: Ladona Heinz, MD;  Location: [facility] INVASIVE CV LAB;  Service: Cardiovascular;  Laterality: N/A;   PATENT FORAMEN OVALE(PFO) CLOSURE N/A 06/16/2023   Procedure: PATENT FORAMEN OVALE(PFO) CLOSURE;  Surgeon: Ladona Heinz, MD;  Location: [facility] IN[facility];  Service: Cardiovascular;  Laterality: N/A;   RADIOLOGY WITH ANESTHESIA N/A 10/12/2020   Procedure: RADIOLOGY WITH ANESTHESIA;  Surgeon: Radiologist, Medication, MD;  Location: [facility] OR;  Service: Radiology;  Laterality: N/A;   RADIOLOGY WITH ANESTHESIA N/A 10/11/2020   Procedure: IR WITH ANESTHESIA;  Surgeon: Radiologist, Medication, MD;  Location: [facility] OR;  Service: Radiology;  Laterality: N/A;   TEE WITHOUT CARDIOVERSION N/A 10/17/2020   Procedure: TRANSESOPHAGEAL ECHOCARDIOGRAM (TEE);  Surgeon: Jeffrie Oneil BROCKS, MD;  Location: [facility] ENDOSCOPY;  Service: Cardiovascular;  Laterality: N/A;   TRANSESOPHAGEAL ECHOCARDIOGRAM (CATH LAB) N/A 05/05/2023   Procedure: TRANSESOPHAGEAL ECHOCARDIOGRAM;  Surgeon: Mona Vinie BROCKS, MD;  Location: [facility] INVAS[facility] LAB;  Service: Cardiovascular;  Laterality: N/A;    Social History:  Social History   Socioeconomic History   Marital status: Single    Spouse name: Armed Forces Operational Officer   Number of children: 1   Years of education: Not on file   Highest education level: GED or equivalent  Occupational History   Not on file  Tobacco Use   Smoking status: Former    Types: E-cigarettes, Cigars    Start date: 2021   Smokeless tobacco: Never   Tobacco comments:    Per pt, Stopped smoking black and milds and vaping 2022  Vaping Use   Vaping status: Former   Quit date: 01/02/2022   Substances: Nicotine, THC   Devices: Patient tring to quit  Substance and Sexual  Activity   Alcohol use: Not Currently    Alcohol/week: 1.0 standard drink of alcohol    Types: 1 Cans of beer per week    Comment: last use 02/2023 wine   Drug use: Yes    Types: Marijuana    Comment: trying to quit   Sexual activity: Yes    Partners: Male    Birth control/protection:  None    Comment: depo stopped 2022  Other Topics Concern   Not on file  Social History Narrative   Not on file   Social Drivers of Health   Tobacco Use: Unknown (02/07/2024)   Received from [facility]   Patient History    Smoking Tobacco Use: Never    Smokeless Tobacco Use: Unknown    Passive Exposure: Not on file  Recent Concern: Tobacco Use - Medium Risk (01/07/2024)   Patient History    Smoking Tobacco Use: Former    Smokeless Tobacco Use: Never    Passive Exposure: Not on file  Financial Resource Strain: Low Risk (09/17/2023)   Overall Financial Resource Strain (CARDIA)    Difficulty of Paying Living Expenses: Not hard at all  Food Insecurity: No Food Insecurity (09/17/2023)   Epic    Worried About Radiation Protection Practitioner of Food in the Last Year: Never true    Ran Out of Food in the Last Year: Never true  Transportation Needs: No Transportation Needs (09/17/2023)   Epic    Lack of Transportation (Medical): No    Lack of Transportation (Non-Medical): No  Physical Activity: Inactive (09/17/2023)   Exercise Vital Sign    Days of Exercise per Week: 0 days    Minutes of Exercise per Session: 0 min  Stress: No Stress Concern Present (09/17/2023)   [employer] of Occupational Health - Occupational Stress Questionnaire    Feeling of Stress: Only a little  Social Connections: Socially Integrated (03/22/2023)   Social Connection and Isolation Panel    Frequency of Communication with Friends and Family: More than three times a week    Frequency of Social Gatherings with Friends and Family: More than three times a week    Attends Religious Services: More than 4 times per year    Active Member of  Clubs or Organizations: Yes    Attends Banker Meetings: More than 4 times per year    Marital Status: Living with partner  Intimate Partner Violence: Not At Risk (09/17/2023)   Epic    Fear of Current or Ex-Partner: No    Emotionally Abused: No    Physically Abused: No    Sexually Abused: No  Depression (PHQ2-9): Medium Risk (12/10/2023)   Depression (PHQ2-9)    PHQ-2 Score: 5  Alcohol Screen: Low Risk (09/17/2023)   Alcohol Screen    Last Alcohol Screening Score (AUDIT): 0  Housing: Low Risk (09/17/2023)   Epic    Unable to Pay for Housing in the Last Year: No    Number of Times Moved in the Last Year: 0    Homeless in the Last Year: No  Recent Concern: Housing - High Risk (09/17/2023)   Epic    Unable to Pay for Housing in the Last Year: Yes    Number of Times Moved in the Last Year: 0    Homeless in the Last Year: Yes  Utilities: Not At Risk (09/17/2023)   Epic    Threatened with loss of utilities: No  Health Literacy: Adequate Health Literacy (03/22/2023)   B1300 Health Literacy    Frequency of need for help with medical instructions: Never    Family History:  Family History  Problem Relation Age of Onset   Diabetes Mother    Hypertension Mother    Epilepsy Mother    Anxiety disorder Mother    Depression Mother    Migraines Mother    Diabetes Maternal Grandmother    Stroke  Maternal Grandmother    Hypertension Maternal Grandmother    Asthma Half-Brother    Asthma Half-Sister    Breast cancer Neg Hx     Medications:   Current Outpatient Medications on File Prior to Visit  Medication Sig Dispense Refill   acetaminophen  (TYLENOL ) 500 MG tablet Take 2 tablets (1,000 mg total) by mouth every 6 (six) hours as needed. 100 tablet 2   aspirin  EC 81 MG tablet Take 1 tablet (81 mg total) by mouth in the morning and at bedtime. Twice daily for 1 month and then 1/day     enoxaparin  (LOVENOX ) 40 MG/0.4ML injection Inject 0.4 mLs (40 mg total) into the skin daily. 12  mL 5   hydrOXYzine  (ATARAX ) 25 MG tablet Take 1 tablet (25 mg total) by mouth every 6 (six) hours as needed for anxiety (or insomnia). 30 tablet 2   meclizine  (ANTIVERT ) 12.5 MG tablet Take 12.5 mg by mouth.     ondansetron  (ZOFRAN -ODT) 4 MG disintegrating tablet TAKE 1 TABLET BY MOUTH EVERY 8 HOURS AS NEEDED 30 tablet 1   Prenatal 27-1 MG TABS Take 1 tablet by mouth daily.     terconazole  (TERAZOL 7 ) 0.4 % vaginal cream Place 1 applicator vaginally at bedtime. Use for seven days 45 g 0   venlafaxine  XR (EFFEXOR -XR) 75 MG 24 hr capsule TAKE 1 CAPSULE BY MOUTH EVERY DAY 90 capsule 2   No current facility-administered medications on file prior to visit.    Allergies:  No Known Allergies    OBJECTIVE:  Physical Exam  There were no vitals filed for this visit.  There is no height or weight on file to calculate BMI. No results found.  General: well developed, well nourished, very pleasant young Caucasian female, seated, in no evident distress  Neurologic Exam Mental Status: Awake and fully alert.  Fluent speech and language.  Oriented to place and time. Recent and remote memory intact. Attention span, concentration and fund of knowledge appropriate. Mood and affect appropriate.  Cranial Nerves: Pupils equal, briskly reactive to light. Extraocular movements full without nystagmus. Visual fields full to confrontation. Hearing intact. Facial sensation intact. Left nasolabial fold flattening.  Tongue and palate moves normally and symmetrically.  Motor: Normal bulk and tone. Normal strength in all tested extremity muscles except slight decreased left hand dexterity and left ADF weakness Sensory.: intact to touch , pinprick , position and vibratory sensation.  Coordination: Rapid alternating movements normal in all extremities except slightly decreased left hand. Finger-to-nose and heel-to-shin performed accurately bilaterally.  Orbits right arm over left arm. Gait and Station: Arises from chair  without difficulty.  Able to stand with arms crossed.  Stance is normal. Gait demonstrates normal stride length and balance without use of assistive device with slight out turning of left foot.  Reflexes: 1+ and symmetric. Toes downgoing.         ASSESSMENT: Kristen Grant is a 29 y.o. year old female with right MCA infarcts in setting of right M1 occlusion s/p IV TNKase  and IR with TICI 3 revascularization on 10/11/2020 and repeat thrombectomy 24 hours later for reocclusion of dominant superior division of the right MCA achieving TICI 2B-2C revascularization on 10/12/2020, infarct embolic of cryptogenic etiology. Vascular risk factors include evidence of PFO s/p closure 12/2020, remote migraine headaches, HLD, prior birth control use and THC use.  Today reports onset of vertigo symptoms over the past 3-4 months.     PLAN:  Vertigo question BPPV Neuro exam intact, low suspicion  new stroke Will refer to PT for vestibular therapy Will start meclizine  12.5 mg twice daily as needed, discussed potential side effects Can consider repeat imaging if symptoms persist but as currently uninsured, will hold off, patient agrees with plan  Does have fm hx of vertigo in mother and sister   Headaches: Suspect mixed migrainous and tension type headaches Now improved with only 1-3 per month Advised use of Tylenol  as needed Advised to call if symptoms worsen   R MCA infarcts :  Residual deficit: Mild left hand and foot weakness. Feels she is limping more but denies increased weakness, unable to appreciate increased weakness on todays exam compared to prior. Referral placed to PT.  Continue aspirin  81 mg daily for secondary stroke prevention Advised to call once she has insurance coverage and will plan on completing lab work including lipid panel and will plan on restarting statin if needed. Previously d/c'd in setting of pregnancy but unfortunately had a miscarriage in 04/2023 at 9-[redacted] weeks  gestation Advised to reestablish care with PCP for ongoing routine management/monitoring of vascular risk factors  PFO:  s/p repair 12/10/2020.   2D echo 01/2022 no residual shunting.   TCD bubble 10/2022 showed right to left medium size shunt with Valsalva.   TEE 05/2023 and cardiac cath 06/2023 without evidence of residual shunt F/u with cardiology Dr. Ladona as needed    Follow-up in 6 months or call earlier if needed    CC:  PCP: Patient, No Pcp Per      Kristen Grant, AGNP-BC  [facility] [address] [place], [place] [address]  Phone [phone] Fax [phone] Note: This document was prepared with digital dictation and possible smart phrase technology. Any transcriptional errors that result from this process are unintentional.      "

## 2024-02-14 ENCOUNTER — Ambulatory Visit: Payer: MEDICAID | Admitting: Adult Health

## 2024-02-14 ENCOUNTER — Encounter: Payer: Self-pay | Admitting: Adult Health

## 2024-02-14 VITALS — BP 106/70 | HR 73 | Ht 69.0 in | Wt 191.6 lb

## 2024-02-14 DIAGNOSIS — Q2112 Patent foramen ovale: Secondary | ICD-10-CM | POA: Diagnosis not present

## 2024-02-14 DIAGNOSIS — G44209 Tension-type headache, unspecified, not intractable: Secondary | ICD-10-CM | POA: Diagnosis not present

## 2024-02-14 DIAGNOSIS — I63511 Cerebral infarction due to unspecified occlusion or stenosis of right middle cerebral artery: Secondary | ICD-10-CM | POA: Diagnosis not present

## 2024-02-14 DIAGNOSIS — G43909 Migraine, unspecified, not intractable, without status migrainosus: Secondary | ICD-10-CM

## 2024-02-14 DIAGNOSIS — Z3A30 30 weeks gestation of pregnancy: Secondary | ICD-10-CM

## 2024-02-14 MED ORDER — ASPIRIN 81 MG PO TBEC: 81.0000 mg | DELAYED_RELEASE_TABLET | Freq: Every day | ORAL | Status: AC

## 2024-02-16 ENCOUNTER — Ambulatory Visit (INDEPENDENT_AMBULATORY_CARE_PROVIDER_SITE_OTHER): Payer: MEDICAID | Admitting: Obstetrics and Gynecology

## 2024-02-16 VITALS — BP 117/81 | HR 83 | Wt 192.0 lb

## 2024-02-16 DIAGNOSIS — Q2112 Patent foramen ovale: Secondary | ICD-10-CM | POA: Diagnosis not present

## 2024-02-16 DIAGNOSIS — Z7901 Long term (current) use of anticoagulants: Secondary | ICD-10-CM | POA: Diagnosis not present

## 2024-02-16 DIAGNOSIS — M79605 Pain in left leg: Secondary | ICD-10-CM

## 2024-02-16 DIAGNOSIS — O99343 Other mental disorders complicating pregnancy, third trimester: Secondary | ICD-10-CM

## 2024-02-16 DIAGNOSIS — G43009 Migraine without aura, not intractable, without status migrainosus: Secondary | ICD-10-CM | POA: Diagnosis not present

## 2024-02-16 DIAGNOSIS — Z3A30 30 weeks gestation of pregnancy: Secondary | ICD-10-CM | POA: Diagnosis not present

## 2024-02-16 DIAGNOSIS — Z8673 Personal history of transient ischemic attack (TIA), and cerebral infarction without residual deficits: Secondary | ICD-10-CM

## 2024-02-16 DIAGNOSIS — F32A Depression, unspecified: Secondary | ICD-10-CM

## 2024-02-16 DIAGNOSIS — G8929 Other chronic pain: Secondary | ICD-10-CM | POA: Diagnosis not present

## 2024-02-16 NOTE — Progress Notes (Signed)
 "  PRENATAL VISIT NOTE  Subjective:  Kristen Grant is a 29 y.o. G3P1011 at [redacted]w[redacted]d being seen today for ongoing prenatal care.  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  Patient reports chronic left lower extremity side which is her stroke side. She states that this has been ongoing since before her pregnancy. Patient also endorses dreams related to events during that day.  Contractions: Not present. Vag. Bleeding: None.  Movement: Present. Denies leaking of fluid.   The following portions of the patient's history were reviewed and updated as appropriate: allergies, current medications, past family history, past medical history, past social history, past surgical history and problem list.   Objective:   Vitals:   02/16/24 0844  BP: 117/81  Pulse: 83  Weight: 192 lb (87.1 kg)    Fetal Status:  Fetal Heart Rate (bpm): 142   Movement: Present    General: Alert, oriented and cooperative. Patient is in no acute distress.  Skin: Skin is warm and dry. No rash noted.   Cardiovascular: Normal heart rate noted  Respiratory: Normal respiratory effort, no problems with respiration noted  Abdomen: Soft, gravid, appropriate for gestational age.  Pain/Pressure: Present     Pelvic:  Cervical exam deferred        Extremities: Normal range of motion.     Mental Status: Normal mood and affect. Normal behavior. Normal judgment and thought content.      12/10/2023    8:38 AM 11/29/2023    9:37 AM 11/09/2023    8:50 AM  Depression screen PHQ 2/9  Decreased Interest 0 3 3  Down, Depressed, Hopeless 1 3 1   PHQ - 2 Score 1 6 4   Altered sleeping 0 3 3  Tired, decreased energy 2 3 3   Change in appetite 1 3 2   Feeling bad or failure about yourself  0 1 1  Trouble concentrating 1 1 3   Moving slowly or fidgety/restless 0 0 1  Suicidal thoughts 0 1 0  PHQ-9 Score 5 18  17    Difficult doing work/chores Somewhat difficult Extremely dIfficult Extremely dIfficult     Data saved with a previous flowsheet row definition        12/10/2023    8:37 AM 11/29/2023    9:38 AM 11/11/2023   10:40 AM 11/09/2023    8:50 AM  GAD 7 : Generalized Anxiety Score  Nervous, Anxious, on Edge 1 3 3 3   Control/stop worrying 1 3 3 3   Worry too much - different things 1 3 3 3   Trouble relaxing 0 3 0 3  Restless 0 1 0 0  Easily annoyed or irritable 1 3 3 3   Afraid - awful might happen 2 2 3 3   Total GAD 7 Score 6 18 15 18   Anxiety Difficulty  Extremely difficult Very difficult Very difficult    Assessment and Plan:  Pregnancy: G3P1011 at [redacted]w[redacted]d 1. Chronic pain of left lower extremity (Primary) - VAS  US  LOWER EXTREMITY VENOUS (DVT); Future  2. [redacted] weeks gestation of pregnancy 28wk labs wnl 12/5: efw 29%, 698g, ac 18%, afi wnl - VAS US  LOWER EXTREMITY VENOUS (DVT); Future  3. History of stroke x2 10/2020 Followed by CardioOB, neurology and MFM Continue ppx lovenox  - VAS US  LOWER EXTREMITY VENOUS (DVT); Future  4. PFO repaired 12/2020 See above  5. Migraine without aura and without status migrainosus, not intractable See above. Last seen by neuro on 1/12 and recommend 108m RTC  6. Anxiety and depression Followed by [facility] psych and currently on effexor  and PRN vistaril . I told her I  recommend she touch base with them re: the new onset dreams. She has not had any new medications started  Preterm  labor symptoms and general obstetric precautions including but not limited to vaginal bleeding, contractions, leaking of fluid and fetal movement were reviewed in detail with the patient. Please refer to After Visit Summary for other counseling recommendations.   Return in about 2 weeks (around 03/01/2024) for in person, high risk ob, md visit.  Future Appointments  Date Time Provider Department Center  03/01/2024  3:50 PM Izell Harari, MD [facility]  03/03/2024  9:15 AM [facility] PROVIDER 1 [facility]  03/03/2024  9:30 AM [facility] US4 [facility]  03/15/2024  1:30 PM Emilio Delilah HERO, CNM [facility]  03/17/2024 10:30 AM Rana Lum CROME, NP [facility]  03/29/2024  1:30 PM Izell Harari, MD [facility]  09/14/2024  3:30 PM Whitfield Raisin, NP [facility] None    Harari Izell, MD  "

## 2024-02-16 NOTE — Progress Notes (Signed)
 Pt complains of lower left pelvic pain.  Pt also has pain in left leg.

## 2024-02-21 ENCOUNTER — Ambulatory Visit (INDEPENDENT_AMBULATORY_CARE_PROVIDER_SITE_OTHER): Payer: MEDICAID

## 2024-02-21 DIAGNOSIS — M79605 Pain in left leg: Secondary | ICD-10-CM | POA: Diagnosis not present

## 2024-02-21 DIAGNOSIS — Z8673 Personal history of transient ischemic attack (TIA), and cerebral infarction without residual deficits: Secondary | ICD-10-CM | POA: Diagnosis not present

## 2024-02-21 DIAGNOSIS — Z3A30 30 weeks gestation of pregnancy: Secondary | ICD-10-CM

## 2024-02-21 DIAGNOSIS — G8929 Other chronic pain: Secondary | ICD-10-CM | POA: Diagnosis not present

## 2024-03-01 ENCOUNTER — Ambulatory Visit: Payer: MEDICAID | Admitting: Obstetrics and Gynecology

## 2024-03-01 VITALS — BP 117/72 | HR 76 | Wt 197.2 lb

## 2024-03-01 DIAGNOSIS — Z3A32 32 weeks gestation of pregnancy: Secondary | ICD-10-CM

## 2024-03-01 DIAGNOSIS — O0993 Supervision of high risk pregnancy, unspecified, third trimester: Secondary | ICD-10-CM | POA: Diagnosis not present

## 2024-03-01 NOTE — Progress Notes (Unsigned)
 [facility] Guidelines for Antenatal Testing and Sonography                       Updated  10.09.25 with MFM Department  INDICATION Growth U/S Antenatal testing (weekly unless specified) DELIVERY RECOMMENDATION (GA)  Diabetes   A1 - good glycemic control     A1 - good glycemic control w/ LGA and/or polyhydramnioos      A2 - good glycemic control     A2  - poor glycemic control or poor compliance    (LGA, polyhydramnios, abnormal glucose)     Pregestational Type  I or II DM - good glycemic control    Pregestational Type  I or II DM - poor glycemic control or other comorbidities  Q4w  Q4w  Q4w  Q4w   Q4w  Q4w  None  32w  32w  28-32w per MFM   32w  28w - weekly 32w - 2x/wk  39.0-39.6w  37.0-39.0w  39-39.6w  37-38.0w or per MFM    39-39.6w  36-37.0w (consider admission)  CHTN   Group I: no meds, no preeclampsia, AGA, nml AFV    Group II: on meds, no preeclampsia, AGA, nml AFV   Group III: with superimposed preeclampsia WITHOUT severe features   Group IV: with superimposed preeclampsia WITH severe features  Q4w @ 24w  Q4w @ 24w  Q4w @ 24w  Q4w @ 24w  None  32w  At diagnosis per MFM  At diagnosis per MFM  38-39.6w  37-39w (or as per MFM with poor control)   37w or at diagnosis if later than 37  34w or sooner if indicated per MDM (consider admission)  Pre-eclampsia  GHTN or Preeclampsia without severe features   Preeclampsia with severe features   Q4w @ 24w or at dx  Q3-4w @ 24w or at dx  28w  Inpatient  37.0w or at diagnosis if later than 37  34.0w or sooner if indicated per MFM   IUGR (see separate protocol)   EFW < 10% +/- AC<10%, nml UA Dopplers, nml AFV, no other comorbidities      EFW < 3% +/- AC<3% OR abnormal UA dopplers OR comorbidities  Q3w  As per MFM algorithm  28w - weekly NST  As per MFM algorithm  38.0-39.0w  37w As per MFM algorithm   Multiple Gestation    DC/DA  Uncomplicated (nml  growth, AFV, no  other comorbidities)  Complicated (FGR, discordance or other comorbidities)      MC/DA   Uncomplicated (nml  growth, AFV, no other comorbidities)  Complicated (TTTS, FGR, discordance or other comorbidities)    TRI/TRI Concordant growth   Q4w Q 2-3 wks   Q4w with Q2w limited US  for TTTS screening  As per MFM   36w  28-30w   32w   As per MFM   38.0-38.6w 36.0-37.6w or as per MFM   37w (34-37.6w as per MFM) 32.0-35.0w as per MFM  35.0-36.6w (uncomplicated)  Advanced Maternal Age >= 62 y.o.  Q4w @ 32w 34w 39.0-40.0w  Oligohydramnios    MVP<2    Q4w @ 24w <32w - weekly  32w - 2x/week 36.0-37.6w or as per MFM  Polyhydramnios, mild (AFI 25-29 cm) Q4-6w @ 28w None 39.0-40w  Polyhydramnios, severe (AFI >=30 cm or SDP > 12 cm) Q4-6w @ 28w 32w 37.0-40.0w (37-38w for severe maternal discomfort)  Cholestasis   Bile acids <100   Bile acids >100 or significant maternal signs/symptoms  Q4w @  24w Q4w @ 24w NST from 28-30w BPP +/-NST from 32w As per MFM  37w  36w (discuss with MFM)  History of Intrauterine Fetal Demise (>24 weeks)   Unexplained or with a recurrent risk factor   >35w prior IUFD with extreme maternal anxiety (? History of abruption)  Q4w @ 24w  32w  39.0w 37.0w  Chronic Renal Disease (Cr >=1.2, proteinuria) Q4w @ 24w As per MFM 37.0w or as per MFM  SLE (lupus) Q4w @ 24w 32w 39.0w (stable) or as per MFM if +flares/steroids  Antiphospholipid syndrome Q4w @ 24w 32w 39.0w  Sickle Cell Disease Q4w @ 24w 32w 39.0-40.0w (if stable); early term if with complications  Hypothyroidism (poorly controlled)  Q4-6w @ 28w 32w As per MFM  Hyperthyroidism (on medications)   Well controlled   Poor control Q4w @ 24w 32w  39.0w  37.0w or as per MFM  Major fetal anomaly Q4w @ 24w 36w or earlier as per MFM As per MFM  Fetal Trisomy 21 (confirmed on amnio or NIPS)  Q4w @ 24w 32w 39w (As per MFM)  Placenta Previa without hemorrhage Q4w @ 28w None 37.0w  Placenta Previa with  hemorrhage Q4w @ 24w At diagnosis 34.0w-36.0w or as per MFM  Low lying placenta without hemorrhage (until ruled out) Q4-8w @ 28w None N/A  Chronic placental abruption Q4w @ 24w 28w or at diagnosis 37.0w or earlier as per MFM  Vasa Previa (managed inpatient or outpatient) Q4w @ 24w 32w 34.0w-36.0w or as per MFM  Velamentous cord insertion Q4-6w @ 28w 36w N/A  Single umbilical artery (2VC) Q4-6w @ 28w None N/A  Marginal cord insertion 20w & 32w None N/A  Polysubstance Use Q4w @ 24w 36w As per MFM  Opiate Maintenance (stable) 32w & 36w None N/A  Tobacco/Nicotine use 32w None N/A  History of severe preeclampsia 32w & 36w None N/A  Abnormal serum markers  (PAPP-A <0.4, Inhibin A >2.0, AFP >3.0 ) Q4w @ 24w 32w As per MFM  Obesity (Pregravid BMI >35 and < 40 32w None 39-40.6w  Grade 3 Obesity (BMI > 40) 099.210, E66.01 Q4w @ 24w 34w 39-40.6w  IVF Pregnancy Q4-6w @ 28w 36w 39.0-40.6w  HIV  Viral load >1000 copies/mL  Viral load <1000 copies/ML w/ART compliant Q4-6w @ 28w None  38.0w (or at onset of labor) via cesarean + AZT 39.0-40.0w  Postdates - O48.0  (schedule testing at [redacted]w[redacted]d or later if possible) N/A 40w 40-41w        OTHER CLINICAL SCENARIOS AND RECOMMENDED DELIVERY TIMES *PPROM: [redacted]w[redacted]d-[redacted]w[redacted]d as per ACOG, patient should be counseled *Previous Myomectomy: If cavity entered, 37w, consider 36w if persistent pain/uterine contractions *Previous Classical Cesarean: 37w, consider 36w if persistent pain/uterine contractions *Previous Uterine Rupture:106w0d-[redacted]w[redacted]d, consider GA at time of rupture, may need to be done earlier *Placenta Accreta: 100w0d-[redacted]w[redacted]d (with steroids) *For any planned delivery at less than 36w, a course of steroids should be given 2-3 days prior to planned delivery date.  *Gestational weeks: 36 means 36 0/7 weeks; number after decimal means days (e.g. 37.6 means [redacted]w[redacted]d)  **All [facility] guidelines are meant to serve as a general approach for clinical practice and  deviations from these guidelines are permitted when appropriate to meet the needs of the individual patient.  This approach serves a guideline for patient care within the department; however, it is not intended to be applied universally to all patients.  Our physicians retain the discretion to modify or deviate from these guidelines based on  the specific clinical circumstances and individual patient needs, ensuring that care is personalized and responsive to each situation.

## 2024-03-03 ENCOUNTER — Other Ambulatory Visit: Payer: Self-pay | Admitting: Obstetrics and Gynecology

## 2024-03-03 ENCOUNTER — Ambulatory Visit (HOSPITAL_BASED_OUTPATIENT_CLINIC_OR_DEPARTMENT_OTHER): Payer: MEDICAID

## 2024-03-03 ENCOUNTER — Ambulatory Visit: Payer: MEDICAID | Attending: Obstetrics and Gynecology | Admitting: Obstetrics and Gynecology

## 2024-03-03 VITALS — BP 125/64 | HR 72

## 2024-03-03 DIAGNOSIS — O09891 Supervision of other high risk pregnancies, first trimester: Secondary | ICD-10-CM | POA: Diagnosis not present

## 2024-03-03 DIAGNOSIS — O365931 Maternal care for other known or suspected poor fetal growth, third trimester, fetus 1: Secondary | ICD-10-CM

## 2024-03-03 DIAGNOSIS — Z362 Encounter for other antenatal screening follow-up: Secondary | ICD-10-CM

## 2024-03-03 DIAGNOSIS — Z3A32 32 weeks gestation of pregnancy: Secondary | ICD-10-CM | POA: Diagnosis not present

## 2024-03-03 DIAGNOSIS — O99413 Diseases of the circulatory system complicating pregnancy, third trimester: Secondary | ICD-10-CM | POA: Diagnosis not present

## 2024-03-03 DIAGNOSIS — O365930 Maternal care for other known or suspected poor fetal growth, third trimester, not applicable or unspecified: Secondary | ICD-10-CM | POA: Diagnosis present

## 2024-03-03 DIAGNOSIS — I69354 Hemiplegia and hemiparesis following cerebral infarction affecting left non-dominant side: Secondary | ICD-10-CM | POA: Insufficient documentation

## 2024-03-03 DIAGNOSIS — O2693 Pregnancy related conditions, unspecified, third trimester: Secondary | ICD-10-CM | POA: Insufficient documentation

## 2024-03-03 DIAGNOSIS — O3443 Maternal care for other abnormalities of cervix, third trimester: Secondary | ICD-10-CM | POA: Diagnosis not present

## 2024-03-03 NOTE — Progress Notes (Signed)
 Maternal-Fetal Medicine Consultation  Name: Kristen Grant  MRN: [id number]  GA: [id number] [redacted]w[redacted]d   Patient is here for fetal growth assessment.  Patent foramen ovale patient takes prophylactic Lovenox .  She does not have gestational diabetes.  BP 125/64 mmHg. Ultrasound The estimated fetal weight is at the 8 percentile and the abdominal circumference measurement at the 5th percentile.  Normal amniotic fluid.  Umbilical artery Doppler showed normal forward diastolic flow.  Cephalic presentation.  Antenatal testing is reassuring.  BPP 8/8.  Fetal growth restriction I explained the finding of fetal growth restriction that is difficult to differentiate from a constitutionally small for gestational age fetus. Most fetuses are small but not growth restricted.  However, her previous child weighed 8 pounds and 3 ounces at birth.  I discussed the possible causes of fetal growth restriction including placental insufficiency (most common cause), chromosomal anomalies and fetal infections.  Patient did not give history of fever or rashes.   I discussed ultrasound protocol of monitoring fetal growth restriction.  Timing of delivery will be based on future fetal growth assessment and antenatal testing.  We discussed Lovenox  prophylaxis and unfractionated heparin  from 36 or [redacted] weeks gestation.  Patient would like to continue Lovenox  close to [redacted] weeks gestation.  Recommendations - NST next week and then weekly BPP till delivery.     Consultation including face-to-face (more than 50%) counseling 20 minutes.

## 2024-03-09 DIAGNOSIS — O365990 Maternal care for other known or suspected poor fetal growth, unspecified trimester, not applicable or unspecified: Secondary | ICD-10-CM | POA: Insufficient documentation

## 2024-03-10 ENCOUNTER — Ambulatory Visit: Payer: MEDICAID

## 2024-03-10 ENCOUNTER — Other Ambulatory Visit: Payer: Self-pay | Admitting: *Deleted

## 2024-03-10 DIAGNOSIS — O365930 Maternal care for other known or suspected poor fetal growth, third trimester, not applicable or unspecified: Secondary | ICD-10-CM

## 2024-03-10 DIAGNOSIS — Z3A33 33 weeks gestation of pregnancy: Secondary | ICD-10-CM

## 2024-03-10 DIAGNOSIS — Z8673 Personal history of transient ischemic attack (TIA), and cerebral infarction without residual deficits: Secondary | ICD-10-CM

## 2024-03-10 NOTE — Addendum Note (Signed)
 Addended by: Shemeka Wardle, VICTOR on: 03/10/2024 03:47 PM   Modules accepted: Level of Service

## 2024-03-10 NOTE — Procedures (Signed)
 Kristen Grant 03-Nov-1995 [redacted]w[redacted]d  Fetus A Non-Stress Test Interpretation for 03/10/24- NST only  Indication: IUGR and hx strokes  Fetal Heart Rate A Mode: External Baseline Rate (A): 140 bpm Variability: Moderate Accelerations: 15 x 15 Decelerations: None Multiple birth?: No  Uterine Activity Mode: Toco Contraction Frequency (min): none Resting Tone Palpated: Relaxed  Interpretation (Fetal Testing) Nonstress Test Interpretation: Reactive Comments: Tracing reviewed By Dr. Ileana

## 2024-03-10 NOTE — Progress Notes (Signed)
 MFM Consult Note  Kristen Grant is currently at [redacted]w[redacted]d. She has been followed due to IUGR.  She denies any problems since her last exam and reports feeling fetal movements throughout the day.    She had a reactive NST today.    A modified BPP performed today shows normal amniotic fluid with a total AFI of 11 cm.    The fetus was in the vertex presentation.  Doppler studies of the umbilical arteries showed a normal S/D ratio of 2.53.  There were no signs of absent or reversed end-diastolic flow.    Due to IUGR, we will continue to follow her with weekly fetal testing and umbilical artery Doppler studies.    Should IUGR continue to be noted later in her pregnancy, delivery will be recommended at between 37 to 38 weeks.  She will return in 1 week for a modified BPP and umbilical artery Doppler study.   The patient stated that all of her questions were answered.   A total of 20 minutes was spent counseling and coordinating the care for this patient.  Greater than 50% of the time was spent in direct face-to-face contact.

## 2024-03-15 ENCOUNTER — Encounter: Admitting: Certified Nurse Midwife

## 2024-03-17 ENCOUNTER — Ambulatory Visit: Payer: MEDICAID | Admitting: Emergency Medicine

## 2024-03-17 ENCOUNTER — Other Ambulatory Visit: Payer: MEDICAID

## 2024-03-17 ENCOUNTER — Ambulatory Visit: Admitting: Emergency Medicine

## 2024-03-29 ENCOUNTER — Encounter: Admitting: Obstetrics and Gynecology

## 2024-09-14 ENCOUNTER — Telehealth: Payer: MEDICAID | Admitting: Adult Health
